# Patient Record
Sex: Female | Born: 1960
Health system: Southern US, Community
[De-identification: ages and names within clinical notes are randomized; demographics above are authoritative.]

## PROBLEM LIST (undated history)

## (undated) DIAGNOSIS — I341 Nonrheumatic mitral (valve) prolapse: Secondary | ICD-10-CM

## (undated) DIAGNOSIS — K219 Gastro-esophageal reflux disease without esophagitis: Secondary | ICD-10-CM

## (undated) DIAGNOSIS — I2510 Atherosclerotic heart disease of native coronary artery without angina pectoris: Secondary | ICD-10-CM

## (undated) DIAGNOSIS — J189 Pneumonia, unspecified organism: Secondary | ICD-10-CM

## (undated) DIAGNOSIS — M1990 Unspecified osteoarthritis, unspecified site: Secondary | ICD-10-CM

## (undated) DIAGNOSIS — R9431 Abnormal electrocardiogram [ECG] [EKG]: Secondary | ICD-10-CM

## (undated) DIAGNOSIS — J449 Chronic obstructive pulmonary disease, unspecified: Secondary | ICD-10-CM

## (undated) DIAGNOSIS — M797 Fibromyalgia: Secondary | ICD-10-CM

## (undated) DIAGNOSIS — I10 Essential (primary) hypertension: Secondary | ICD-10-CM

## (undated) DIAGNOSIS — R7303 Prediabetes: Secondary | ICD-10-CM

## (undated) DIAGNOSIS — N879 Dysplasia of cervix uteri, unspecified: Secondary | ICD-10-CM

## (undated) DIAGNOSIS — G5600 Carpal tunnel syndrome, unspecified upper limb: Secondary | ICD-10-CM

## (undated) DIAGNOSIS — G35 Multiple sclerosis: Secondary | ICD-10-CM

## (undated) DIAGNOSIS — F32A Depression, unspecified: Secondary | ICD-10-CM

## (undated) DIAGNOSIS — R5382 Chronic fatigue, unspecified: Secondary | ICD-10-CM

## (undated) DIAGNOSIS — F329 Major depressive disorder, single episode, unspecified: Secondary | ICD-10-CM

## (undated) DIAGNOSIS — F419 Anxiety disorder, unspecified: Secondary | ICD-10-CM

## (undated) DIAGNOSIS — G2581 Restless legs syndrome: Secondary | ICD-10-CM

## (undated) DIAGNOSIS — Z8489 Family history of other specified conditions: Secondary | ICD-10-CM

## (undated) HISTORY — DX: Carpal tunnel syndrome, unspecified upper limb: G56.00

## (undated) HISTORY — DX: Depression, unspecified: F32.A

## (undated) HISTORY — DX: Major depressive disorder, single episode, unspecified: F32.9

## (undated) HISTORY — PX: DILATION AND CURETTAGE OF UTERUS: SHX78

## (undated) HISTORY — PX: OTHER SURGICAL HISTORY: SHX169

## (undated) HISTORY — DX: Abnormal electrocardiogram (ECG) (EKG): R94.31

## (undated) HISTORY — DX: Chronic fatigue, unspecified: R53.82

## (undated) HISTORY — PX: BICEPS TENDON REPAIR: SHX566

## (undated) HISTORY — DX: Restless legs syndrome: G25.81

## (undated) HISTORY — PX: ORIF TOE FRACTURE: SUR965

## (undated) HISTORY — DX: Multiple sclerosis: G35

## (undated) HISTORY — PX: CORONARY ANGIOPLASTY WITH STENT PLACEMENT: SHX49

## (undated) HISTORY — DX: Nonrheumatic mitral (valve) prolapse: I34.1

## (undated) HISTORY — PX: ROTATOR CUFF REPAIR: SHX139

## (undated) HISTORY — DX: Dysplasia of cervix uteri, unspecified: N87.9

## (undated) HISTORY — DX: Pneumonia, unspecified organism: J18.9

## (undated) HISTORY — PX: CARPAL TUNNEL RELEASE: SHX101

## (undated) HISTORY — PX: ABDOMINAL HYSTERECTOMY: SHX81

## (undated) HISTORY — DX: Gastro-esophageal reflux disease without esophagitis: K21.9

## (undated) HISTORY — DX: Essential (primary) hypertension: I10

## (undated) HISTORY — PX: HAMMER TOE SURGERY: SHX385

## (undated) HISTORY — DX: Anxiety disorder, unspecified: F41.9

## (undated) HISTORY — PX: FOOT SURGERY: SHX648

## (undated) HISTORY — DX: Unspecified osteoarthritis, unspecified site: M19.90

---

## 1999-08-27 ENCOUNTER — Emergency Department (HOSPITAL_COMMUNITY): Admission: EM | Admit: 1999-08-27 | Discharge: 1999-08-27 | Payer: Self-pay | Admitting: Emergency Medicine

## 2005-02-09 ENCOUNTER — Ambulatory Visit (HOSPITAL_COMMUNITY): Admission: RE | Admit: 2005-02-09 | Discharge: 2005-02-10 | Payer: Self-pay | Admitting: Neurological Surgery

## 2005-03-06 ENCOUNTER — Encounter: Admission: RE | Admit: 2005-03-06 | Discharge: 2005-03-06 | Payer: Self-pay | Admitting: Neurological Surgery

## 2005-08-07 ENCOUNTER — Encounter: Admission: RE | Admit: 2005-08-07 | Discharge: 2005-08-07 | Payer: Self-pay | Admitting: Neurological Surgery

## 2007-12-23 ENCOUNTER — Encounter: Admission: RE | Admit: 2007-12-23 | Discharge: 2007-12-23 | Payer: Self-pay | Admitting: Neurological Surgery

## 2008-01-14 ENCOUNTER — Encounter: Admission: RE | Admit: 2008-01-14 | Discharge: 2008-01-14 | Payer: Self-pay | Admitting: Neurology

## 2008-03-19 ENCOUNTER — Encounter: Admission: RE | Admit: 2008-03-19 | Discharge: 2008-03-19 | Payer: Self-pay | Admitting: Rheumatology

## 2009-10-06 ENCOUNTER — Ambulatory Visit (HOSPITAL_COMMUNITY): Admission: RE | Admit: 2009-10-06 | Discharge: 2009-10-07 | Payer: Self-pay | Admitting: Neurological Surgery

## 2009-10-16 HISTORY — PX: CERVICAL SPINE SURGERY: SHX589

## 2009-11-16 ENCOUNTER — Encounter: Admission: RE | Admit: 2009-11-16 | Discharge: 2009-11-16 | Payer: Self-pay | Admitting: Neurological Surgery

## 2010-07-25 LAB — COMPREHENSIVE METABOLIC PANEL
ALT: 20 U/L (ref 0–35)
AST: 22 U/L (ref 0–37)
Albumin: 3.6 g/dL (ref 3.5–5.2)
Alkaline Phosphatase: 115 U/L (ref 39–117)
BUN: 5 mg/dL — ABNORMAL LOW (ref 6–23)
CO2: 28 mEq/L (ref 19–32)
Calcium: 9.2 mg/dL (ref 8.4–10.5)
Chloride: 101 mEq/L (ref 96–112)
Creatinine, Ser: 0.74 mg/dL (ref 0.4–1.2)
GFR calc Af Amer: >60 mL/min (ref >60)
GFR calc non Af Amer: >60 mL/min (ref >60)
Glucose, Bld: 95 mg/dL (ref 70–99)
Potassium: 3.4 mEq/L — ABNORMAL LOW (ref 3.5–5.1)
Sodium: 137 mEq/L (ref 135–145)
Total Bilirubin: 0.6 mg/dL (ref 0.3–1.2)
Total Protein: 6.5 g/dL (ref 6.0–8.3)

## 2010-07-25 LAB — CBC
HCT: 40.6 % (ref 36.0–46.0)
Hemoglobin: 14.3 g/dL (ref 12.0–15.0)
MCHC: 35.2 g/dL (ref 30.0–36.0)
MCV: 96.4 fL (ref 78.0–100.0)
Platelets: 337 10*3/uL (ref 150–400)
RBC: 4.21 MIL/uL (ref 3.87–5.11)
RDW: 13.2 % (ref 11.5–15.5)
WBC: 10.6 10*3/uL — ABNORMAL HIGH (ref 4.0–10.5)

## 2010-07-25 LAB — DIFFERENTIAL
Basophils Absolute: 0 10*3/uL (ref 0.0–0.1)
Basophils Relative: 0 % (ref 0–1)
Eosinophils Absolute: 0 10*3/uL (ref 0.0–0.7)
Eosinophils Relative: 0 % (ref 0–5)
Lymphocytes Relative: 32 % (ref 12–46)
Lymphs Abs: 3.4 10*3/uL (ref 0.7–4.0)
Monocytes Absolute: 0.6 10*3/uL (ref 0.1–1.0)
Monocytes Relative: 6 % (ref 3–12)
Neutro Abs: 6.6 10*3/uL (ref 1.7–7.7)
Neutrophils Relative %: 62 % (ref 43–77)

## 2010-07-25 LAB — PROTIME-INR
INR: 0.94 (ref 0.00–1.49)
Prothrombin Time: 12.5 seconds (ref 11.6–15.2)

## 2010-07-25 LAB — APTT: aPTT: 31 seconds (ref 24–37)

## 2010-07-25 LAB — SURGICAL PCR SCREEN
MRSA, PCR: NEGATIVE
Staphylococcus aureus: NEGATIVE

## 2010-09-23 NOTE — Op Note (Signed)
NAMECAITLAN, CHAUCA                 ACCOUNT NO.:  [id number]   MEDICAL RECORD NO.:  [id number]          PATIENT TYPE:  OIB   LOCATION:  3007                         FACILITY:  [facility]   PHYSICIAN:  Tia Alert, MD     DATE OF BIRTH:  [birth year]   DATE OF PROCEDURE:  02/09/2005  DATE OF DISCHARGE:                                 OPERATIVE REPORT   PREOPERATIVE DIAGNOSIS:  Cervical spondylosis with stenosis, C5-6 and C6-7.   POSTOPERATIVE DIAGNOSIS:  Cervical spondylosis with stenosis, C5-6 and C6-7.   PROCEDURE:  1.  Decompressive anterior cervical diskectomy, C5-6 and C6-7.  2.  Anterior cervical arthrodesis, C5-6 and C6-7, utilizing 7-mm VG2      allografts.  3.  Anterior cervical plating, C5-7, utilizing a 40-mm Atlantis Vision      plate.   SURGEON:  Marikay Alar, M.D.   ASSISTANT:  Aliene Beams, M.D.   ANESTHESIA:  General endotracheal.   COMPLICATIONS:  None apparent.   INDICATIONS FOR PROCEDURE:  Ms. Koeller is a 50 year old white female with a  history of MS, who presented with significant neck pain with arm pain.  She  had an MRI which showed significant cervical spondylosis at C5-6 and C6-7.  She had a previous MRI the year before and the new MRI showed significant  progression of her spondylosis and stenosis at C5-6.  She had worsening neck  pain and arm pain.  I recommended a decompressive anterior cervical  diskectomy with fusion and plating at C5-6 and C6-7.  She understood the  risks, the benefits and expected outcome, and wished to proceed.   DESCRIPTION OF PROCEDURE:  The patient was taken to the operating room and  after induction of adequate generalized endotracheal anesthesia, she was  rolled in the supine position on the operating room table.  Her right  anterior cervical region was prepped with DuraPrep and then draped in the  usual sterile fashion.  Five milliliters of local anesthesia were injected  and the incision was made to the right of midline and  carried down through  the platysma muscle.  The platysma was elevated, opened and undermined with  Metzenbaum scissors.  I then dissected in a plane medial to the  sternocleidomastoid muscle and internal carotid artery and lateral to the  trachea and esophagus to expose C5-6 and C6-7.  Intraoperative fluoroscopy  confirmed my level.  We then used a Leksell rongeur to remove the anterior  osteophytes at C5-7 and C6-7, which had overgrown quite a bit.  We then  placed our Shadowline retractors after the longus colli muscles were taken  down bilaterally.  The C5-6 and C6-7 interspaces were incised and the  initial diskectomy was done with pituitary rongeurs and curved curettes.  I  then used a high-speed drill to drill the endplates to prepare for later  arthrodesis.  I drilled down to the level of the posterior longitudinal  ligament.  We then brought in the operating microscope and the posterior  longitudinal ligament was opened with a nerve hook and then removed in a  circumferential fashion  along with the posterior osteophytes.  We were very  careful to undercut the inferior part of the body C5, the superior part of  the body of C6, inferior body of C6 and superior body of C7 to decompress  the central canal until the dura was quite capacious and coming out at [place]  and no longer pushed away from [place] by the osteophytes.  We performed  bilateral foraminotomies.  The C6 and C7 nerve roots were identified  bilaterally and decompressed out past the pedicle level.  Once decompression  was complete, we dried the surgical bed with Gelfoam.  We removed the  Gelfoam, irrigated with saline solution containing bacitracin, measured the  interspaces and then placed two 7-mm fibular allograft into the interspace  at C5-6 and C6-7.  We then used a 40-mm Atlantis Vision plate and placed two  13-mm variable-angle screws into the body of C5, C6 and C7, and locked these  into position with the locking mechanism  on the plate.  We then irrigated  over it again with saline solution containing bacitracin.  We dried all  bleeding points with bipolar cautery and used also SURGIFLO to help with  hemostasis.  We then irrigated this away and removed it with suction from  the lateral epidural space adjacent to the grafts.  We then inspected once  again to assure adequate hemostasis.  We then closed the platysma with  interrupted 3-0 Vicryl, closed the subcuticular tissue with interrupted 3-0  Vicryl and closed the skin with Dermabond.  The drapes were removed.  The  patient was awakened from general anesthesia and transferred to the recovery  room in stable condition.  At the end of the procedure, all sponge, needle  and instrument counts were correct.      Tia Alert, MD  Electronically Signed     DSJ/MEDQ  D:  02/09/2005  T:  02/10/2005  Job:  [id number]

## 2011-11-27 DIAGNOSIS — R5381 Other malaise: Secondary | ICD-10-CM | POA: Insufficient documentation

## 2011-11-27 DIAGNOSIS — G35 Multiple sclerosis: Secondary | ICD-10-CM | POA: Insufficient documentation

## 2011-11-27 DIAGNOSIS — Z5181 Encounter for therapeutic drug level monitoring: Secondary | ICD-10-CM | POA: Insufficient documentation

## 2011-11-27 DIAGNOSIS — R269 Unspecified abnormalities of gait and mobility: Secondary | ICD-10-CM | POA: Insufficient documentation

## 2012-06-04 DIAGNOSIS — M6530 Trigger finger, unspecified finger: Secondary | ICD-10-CM | POA: Insufficient documentation

## 2012-08-15 ENCOUNTER — Other Ambulatory Visit: Payer: Self-pay

## 2012-08-15 MED ORDER — METHYLPHENIDATE HCL 20 MG PO TABS: 20.0000 mg | ORAL_TABLET | Freq: Every day | ORAL | Status: DC

## 2012-08-15 NOTE — Telephone Encounter (Signed)
Patient requesting refill on Ritalin.  Would like rx mailed to her when ready.

## 2012-09-07 ENCOUNTER — Other Ambulatory Visit: Payer: Self-pay | Admitting: Neurology

## 2012-09-16 ENCOUNTER — Telehealth: Payer: Self-pay | Admitting: Neurology

## 2012-09-19 ENCOUNTER — Other Ambulatory Visit: Payer: Self-pay

## 2012-09-19 MED ORDER — INTERFERON BETA-1A 30 MCG/0.5ML IM KIT: 30.0000 ug | PACK | INTRAMUSCULAR | Status: DC

## 2012-09-23 ENCOUNTER — Other Ambulatory Visit: Payer: Self-pay

## 2012-09-23 ENCOUNTER — Telehealth: Payer: Self-pay | Admitting: Neurology

## 2012-09-23 MED ORDER — METHYLPHENIDATE HCL 20 MG PO TABS: 20.0000 mg | ORAL_TABLET | Freq: Every day | ORAL | Status: DC

## 2012-09-24 ENCOUNTER — Telehealth: Payer: Self-pay | Admitting: *Deleted

## 2012-09-24 ENCOUNTER — Telehealth: Payer: Self-pay

## 2012-09-24 MED ORDER — HYDROXYZINE PAMOATE 25 MG PO CAPS: 25.0000 mg | ORAL_CAPSULE | Freq: Two times a day (BID) | ORAL | Status: DC

## 2012-09-24 NOTE — Telephone Encounter (Signed)
I called the patient. She has had itching for one year over various parts of the body. She has been to a dermatologist, and no source of the itching was found. She may have a neurodermatitis, and I will try vistaril, and if this is not effective, I will try clonazepam.

## 2012-09-24 NOTE — Telephone Encounter (Signed)
I spoke with patient.  She is aware Rx was mailed to her correct address.  Address was incorrect in the Epic System, however I updated it.  She will call me back on Wed if she has not received the Rx in the mail.  I apologized to her for the confusion.

## 2012-10-11 ENCOUNTER — Ambulatory Visit (INDEPENDENT_AMBULATORY_CARE_PROVIDER_SITE_OTHER): Payer: Medicaid Other | Admitting: Neurology

## 2012-10-11 ENCOUNTER — Telehealth: Payer: Self-pay | Admitting: Neurology

## 2012-10-11 ENCOUNTER — Encounter: Payer: Self-pay | Admitting: Neurology

## 2012-10-11 VITALS — BP 130/82 | HR 90 | Ht 62.0 in | Wt 164.0 lb

## 2012-10-11 DIAGNOSIS — Z5181 Encounter for therapeutic drug level monitoring: Secondary | ICD-10-CM

## 2012-10-11 DIAGNOSIS — R269 Unspecified abnormalities of gait and mobility: Secondary | ICD-10-CM

## 2012-10-11 DIAGNOSIS — G35 Multiple sclerosis: Secondary | ICD-10-CM

## 2012-10-11 DIAGNOSIS — R5383 Other fatigue: Secondary | ICD-10-CM

## 2012-10-11 DIAGNOSIS — G35D Multiple sclerosis, unspecified: Secondary | ICD-10-CM

## 2012-10-11 LAB — COMPREHENSIVE METABOLIC PANEL
ALT: 26 IU/L (ref 0–32)
AST: 23 IU/L (ref 0–40)
Albumin/Globulin Ratio: 1.8 (ref 1.1–2.5)
Albumin: 4.5 g/dL (ref 3.5–5.5)
Alkaline Phosphatase: 162 IU/L — ABNORMAL HIGH (ref 39–117)
BUN/Creatinine Ratio: 5 — ABNORMAL LOW (ref 9–23)
BUN: 4 mg/dL — ABNORMAL LOW (ref 6–24)
CO2: 29 mmol/L — ABNORMAL HIGH (ref 19–28)
Calcium: 9.9 mg/dL (ref 8.7–10.2)
Chloride: 103 mmol/L (ref 96–108)
Creatinine, Ser: 0.73 mg/dL (ref 0.57–1.00)
GFR calc Af Amer: 111 mL/min/{1.73_m2} (ref >59)
GFR calc non Af Amer: 96 mL/min/{1.73_m2} (ref >59)
Globulin, Total: 2.5 g/dL (ref 1.5–4.5)
Glucose: 80 mg/dL (ref 65–99)
Potassium: 4.3 mmol/L (ref 3.5–5.2)
Sodium: 140 mmol/L (ref 134–144)
Total Bilirubin: 0.3 mg/dL (ref 0.0–1.2)
Total Protein: 7 g/dL (ref 6.0–8.5)

## 2012-10-11 LAB — CBC WITH DIFFERENTIAL
Basophils Absolute: 0 x10E3/uL (ref 0.0–0.2)
Basos: 0 % (ref 0–3)
Eos: 1 % (ref 0–5)
Eosinophils Absolute: 0.1 x10E3/uL (ref 0.0–0.4)
HCT: 43.8 % (ref 34.0–46.6)
Hemoglobin: 15 g/dL (ref 11.1–15.9)
Lymphocytes Absolute: 3.5 x10E3/uL — ABNORMAL HIGH (ref 0.7–3.1)
Lymphs: 39 % (ref 14–46)
MCH: 30.4 pg (ref 26.6–33.0)
MCHC: 34.2 g/dL (ref 31.5–35.7)
MCV: 89 fL (ref 79–97)
Monocytes Absolute: 0.5 x10E3/uL (ref 0.1–0.9)
Monocytes: 5 % (ref 4–12)
Neutrophils Absolute: 4.9 x10E3/uL (ref 1.4–7.0)
Neutrophils Relative %: 55 % (ref 40–74)
Platelets: 395 x10E3/uL — ABNORMAL HIGH (ref 155–379)
RBC: 4.93 x10E6/uL (ref 3.77–5.28)
RDW: 14.1 % (ref 12.3–15.4)
WBC: 8.9 x10E3/uL (ref 3.4–10.8)

## 2012-10-11 MED ORDER — CLONAZEPAM 0.25 MG PO TBDP: 0.2500 mg | ORAL_TABLET | Freq: Two times a day (BID) | ORAL | Status: DC | PRN

## 2012-10-11 MED ORDER — METHYLPHENIDATE HCL 20 MG PO TABS: 20.0000 mg | ORAL_TABLET | Freq: Every day | ORAL | Status: DC

## 2012-10-11 MED ORDER — MODAFINIL 200 MG PO TABS: 200.0000 mg | ORAL_TABLET | Freq: Every day | ORAL | Status: DC

## 2012-10-11 NOTE — Progress Notes (Signed)
Reason for visit: Multiple sclerosis  Summer Henderson is an 52 y.o. female  History of present illness:  Summer Henderson is a 52 year old right-handed white female with a history of multiple sclerosis. The patient has some mild gait instability, with occasional stumbles. The patient has not fallen since last seen. The patient reports no other new issues with numbness or weakness of the face, arms, or legs. The patient has fatigue issues, but she takes Provigil for this. The patient has chronic itching, and her dermatologist has not been able to find an etiology of this. The use of Vistaril has not helped, only made her drowsy. The patient returns for an evaluation. The last blood work done in July 2013 showed mild liver enzyme elevations.  Past Medical History  Diagnosis Date  . Multiple sclerosis   . Chronic fatigue   . Cervical dysplasia   . GERD (gastroesophageal reflux disease)   . Carpal tunnel syndrome   . Prolonged QT interval     Past Surgical History  Procedure Laterality Date  . Rectovaginal fistula repair    . Abdominal hysterectomy    . Dilation and curettage of uterus    . Foot surgery Bilateral     For bunions  . Carpal tunnel release    . Bladder resuspension    . Hammer toe surgery Left   . Cervical spine surgery  10-16-2009  . Orif toe fracture Right   . Mitral and triscuspid regurgitation      Family History  Problem Relation Age of Onset  . Diabetes Mother   . Melanoma Mother   . Cancer Maternal Grandmother   . Cancer Maternal Grandfather     Social history:  reports that she has been smoking.  She has never used smokeless tobacco. She reports that she does not drink alcohol or use illicit drugs.  Allergies:  Allergies  Allergen Reactions  . Demerol (Meperidine)   . Erythromycin   . Morphine And Related   . Sulfa Antibiotics   . Tape     Adhesive tape    Medications:  Current Outpatient Prescriptions on File Prior to Visit  Medication Sig Dispense  Refill  . DULoxetine (CYMBALTA) 60 MG capsule TAKE ONE CAPSULE BY MOUTH TWICE A DAY  60 capsule  3  . interferon beta-1a (AVONEX) 30 MCG/0.5ML injection Inject 0.5 mLs (30 mcg total) into the muscle every 7 (seven) days.  1 kit  3   No current facility-administered medications on file prior to visit.    ROS:  Out of a complete 14 system review of symptoms, the patient complains only of the following symptoms, and all other reviewed systems are negative.  Weight gain, fatigue Swelling in the legs Itching Snoring Constipation, urinary incontinence Easy bruising, easy bleeding Feeling hot, increased thirst Jaw pain Allergies Memory loss, weakness Depression, anxiety, not enough sleep, decreased energy, and this interest in activities Restless legs  Blood pressure 130/82, pulse 90, height 5\' 2"  (1.575 m), weight 164 lb (74.39 kg).  Physical Exam  General: The patient is alert and cooperative at the time of the examination.  Skin: No significant peripheral edema is noted.   Neurologic Exam  Cranial nerves: Facial symmetry is present. Speech is normal, no aphasia or dysarthria is noted. Extraocular movements are full. Visual fields are full. Pupils are equal, round and reactive to light. Discs are flat bilaterally.  Motor: The patient has good strength in all 4 extremities.  Coordination: The patient has good finger-nose-finger  and heel-to-shin bilaterally.  Gait and station: The patient has a normal gait. Tandem gait is slightly unsteady. Romberg is negative. No drift is seen.  Reflexes: Deep tendon reflexes are symmetric.   Assessment/Plan:  1. Multiple sclerosis  2. Gait disorder  3. Chronic fatigue  4. 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 The patient will followup through this office in about 6 months. MRI of the brain will be ordered. Blood work will be done today.  Marlan Palau  MD 10/11/2012 3:23 PM  [facility] [address] [place], [place] [address]  Phone [phone] Fax [phone]

## 2012-10-11 NOTE — Telephone Encounter (Signed)
I called the patient. The Blood work looked OK. The liver enzymes have essentially normalized with exception of mild elevation of alkaline phosphatase phosphatase.

## 2012-10-25 ENCOUNTER — Telehealth: Payer: Self-pay | Admitting: Neurology

## 2012-10-25 NOTE — Telephone Encounter (Signed)
I called patient. The MRI the brain does show a small enhancing area in the right parietal area. The patient does not wish to switch medications at this time, but we do need to consider other medication such as Tecfidera in the future. We will reconsider getting another MRI in 6-8 months.

## 2012-11-19 ENCOUNTER — Other Ambulatory Visit: Payer: Self-pay

## 2012-11-19 MED ORDER — METHYLPHENIDATE HCL 20 MG PO TABS: 20.0000 mg | ORAL_TABLET | Freq: Every day | ORAL | Status: DC

## 2012-11-19 NOTE — Telephone Encounter (Signed)
Patient called requesting a refill on Ritalin.  She would like it mailed to her when it's ready.

## 2012-11-27 ENCOUNTER — Encounter: Payer: Self-pay | Admitting: Neurology

## 2012-12-19 ENCOUNTER — Emergency Department (HOSPITAL_BASED_OUTPATIENT_CLINIC_OR_DEPARTMENT_OTHER): Payer: Medicare Other

## 2012-12-19 ENCOUNTER — Emergency Department (HOSPITAL_BASED_OUTPATIENT_CLINIC_OR_DEPARTMENT_OTHER)
Admission: EM | Admit: 2012-12-19 | Discharge: 2012-12-20 | Disposition: A | Discharge status: Home / Self Care | Payer: Medicare Other | Attending: Emergency Medicine | Admitting: Emergency Medicine

## 2012-12-19 ENCOUNTER — Encounter (HOSPITAL_BASED_OUTPATIENT_CLINIC_OR_DEPARTMENT_OTHER): Payer: Self-pay | Admitting: Student

## 2012-12-19 DIAGNOSIS — G8929 Other chronic pain: Secondary | ICD-10-CM | POA: Insufficient documentation

## 2012-12-19 DIAGNOSIS — Z87410 Personal history of cervical dysplasia: Secondary | ICD-10-CM | POA: Insufficient documentation

## 2012-12-19 DIAGNOSIS — R5381 Other malaise: Secondary | ICD-10-CM | POA: Insufficient documentation

## 2012-12-19 DIAGNOSIS — Y9389 Activity, other specified: Secondary | ICD-10-CM | POA: Insufficient documentation

## 2012-12-19 DIAGNOSIS — IMO0002 Reserved for concepts with insufficient information to code with codable children: Secondary | ICD-10-CM | POA: Insufficient documentation

## 2012-12-19 DIAGNOSIS — Z8719 Personal history of other diseases of the digestive system: Secondary | ICD-10-CM | POA: Insufficient documentation

## 2012-12-19 DIAGNOSIS — Z8669 Personal history of other diseases of the nervous system and sense organs: Secondary | ICD-10-CM | POA: Insufficient documentation

## 2012-12-19 DIAGNOSIS — W1809XA Striking against other object with subsequent fall, initial encounter: Secondary | ICD-10-CM | POA: Insufficient documentation

## 2012-12-19 DIAGNOSIS — Y929 Unspecified place or not applicable: Secondary | ICD-10-CM | POA: Insufficient documentation

## 2012-12-19 DIAGNOSIS — S62619A Displaced fracture of proximal phalanx of unspecified finger, initial encounter for closed fracture: Secondary | ICD-10-CM

## 2012-12-19 DIAGNOSIS — Z79899 Other long term (current) drug therapy: Secondary | ICD-10-CM | POA: Insufficient documentation

## 2012-12-19 DIAGNOSIS — F17200 Nicotine dependence, unspecified, uncomplicated: Secondary | ICD-10-CM | POA: Insufficient documentation

## 2012-12-19 NOTE — ED Notes (Signed)
Pt reports pain to right hand digit 4 s/p fall

## 2012-12-19 NOTE — ED Notes (Signed)
Patient transported to X-ray 

## 2012-12-20 ENCOUNTER — Other Ambulatory Visit: Payer: Self-pay | Admitting: *Deleted

## 2012-12-20 MED ORDER — METHYLPHENIDATE HCL 20 MG PO TABS: 20.0000 mg | ORAL_TABLET | Freq: Every day | ORAL | Status: DC

## 2012-12-20 NOTE — ED Provider Notes (Signed)
CSN: [id number]     Arrival date & time 12/19/12  2204 History     First MD Initiated Contact with Patient 12/19/12 2312     Chief Complaint  Patient presents with  . Hand Pain    right hand digit 4   HPI Summer Henderson is a 52 y.o. female had a fall a day ago, she caught herself reaching toward a rocking chair and struck the interdigital space between her third and fourth digits on the right hand, patient is had pain and swelling in this area she is concerned that it is broken. Patient's pain is moderate, she's not taken anything for it, is associated with swelling in the area, no numbness or tingling.    Past Medical History  Diagnosis Date  . Multiple sclerosis   . Chronic fatigue   . Cervical dysplasia   . GERD (gastroesophageal reflux disease)   . Carpal tunnel syndrome   . Prolonged QT interval    Past Surgical History  Procedure Laterality Date  . Rectovaginal fistula repair    . Abdominal hysterectomy    . Dilation and curettage of uterus    . Foot surgery Bilateral     For bunions  . Carpal tunnel release    . Bladder resuspension    . Hammer toe surgery Left   . Cervical spine surgery  10-16-2009  . Orif toe fracture Right   . Mitral and triscuspid regurgitation     Family History  Problem Relation Age of Onset  . Diabetes Mother   . Melanoma Mother   . Cancer Maternal Grandmother   . Cancer Maternal Grandfather    History  Substance Use Topics  . Smoking status: Current Every Day Smoker -- 1.00 packs/day  . Smokeless tobacco: Never Used  . Alcohol Use: No   OB History   Grav Para Term Preterm Abortions TAB SAB Ect Mult Living                 Review of Systems At least 10pt or greater review of systems completed and are negative except where specified in the HPI.  Allergies  Demerol; Erythromycin; Morphine and related; Sulfa antibiotics; and Tape  Home Medications   Current Outpatient Rx  Name  Route  Sig  Dispense  Refill  . clonazePAM  (KLONOPIN) 0.25 MG disintegrating tablet   Oral   Take 1 tablet (0.25 mg total) by mouth 2 (two) times daily as needed.   60 tablet   1   . DULoxetine (CYMBALTA) 60 MG capsule      TAKE ONE CAPSULE BY MOUTH TWICE A DAY   60 capsule   3     PLEASE SCHEDULE APPT   . ENABLEX 7.5 MG 24 hr tablet   Oral   Take 7.5 mg by mouth daily.         . interferon beta-1a (AVONEX) 30 MCG/0.5ML injection   Intramuscular   Inject 0.5 mLs (30 mcg total) into the muscle every 7 (seven) days.   1 kit   3     Please Schedule Appt   . KLOR-CON M20 20 MEQ tablet   Oral   Take 20 tablets by mouth daily.         Marland Kitchen losartan (COZAAR) 50 MG tablet   Oral   Take 50 mg by mouth daily.         . methylphenidate (RITALIN) 20 MG tablet   Oral   Take 1 tablet (20 mg  total) by mouth daily. At noon   30 tablet   0     Mail   . modafinil (PROVIGIL) 200 MG tablet   Oral   Take 1 tablet (200 mg total) by mouth daily.   30 tablet   5   . NASONEX 50 MCG/ACT nasal spray   Nasal   Place 50 sprays into the nose daily.         Marland Kitchen NEXIUM 40 MG capsule   Oral   Take 40 mg by mouth daily.          BP 141/90  Pulse 83  Temp(Src) 98.2 F (36.8 C) (Oral)  Resp 20  SpO2 100% Physical Exam  Nursing notes reviewed.  Electronic medical record reviewed. VITAL SIGNS:   Filed Vitals:   12/19/12 2219  BP: 141/90  Pulse: 83  Temp: 98.2 F (36.8 C)  TempSrc: Oral  Resp: 20  SpO2: 100%   CONSTITUTIONAL: Awake, oriented, appears non-toxic HENT: Atraumatic, normocephalic, oral mucosa pink and moist, airway patent. Nares patent without drainage. External ears normal. EYES: Conjunctiva clear, EOMI, PERRLA NECK: Trachea midline, non-tender, supple CARDIOVASCULAR: Normal heart rate, Normal rhythm, No murmurs, rubs, gallops PULMONARY/CHEST: Clear to auscultation, no rhonchi, wheezes, or rales. Symmetrical breath sounds. Non-tender. NEUROLOGIC: Non-focal, moving all four extremities, no gross  sensory or motor deficits. EXTREMITIES: No clubbing, cyanosis, or edema. Some soft tissue swelling on the dorsal and volar aspects between the MTPs of the third and fourth digits, point of maximum tenderness at the fourth MTP joint. Distally neurovascularly intact with good capillary refill and sensation to SKIN: Warm, Dry, No erythema, No rash  ED Course   Procedures (including critical care time)  Labs Reviewed - No data to display Dg Hand Complete Right  12/20/2012   *RADIOLOGY REPORT*  Clinical Data: Fall, right hand pain  RIGHT HAND - COMPLETE 3+ VIEW  Comparison: None.  Findings: Curvilinear trabecular disruption is noted at the base of the proximal phalanx of the right fourth digit.  The bones mildly osteopenic, which may reduce the sensitivity for detection of acute fracture.  No soft tissue abnormality.  No radiopaque foreign body.  IMPRESSION: Possible nondisplaced fracture, base of the right fourth proximal phalanx.   Original Report Authenticated By: Christiana Pellant, M.D.   1. Proximal phalanx fracture of finger, closed, initial encounter     MDM  Patient with nondisplaced proximal phalanx fracture of fourth digit of right hand.  Discussed buddy taping with the patient, she can followup with Dr. Orlan Leavens.  Do not think a splint is necessary with this fracture, buddy taping should suffice. Return precautions given, discharged home stable and good condition  Jones Skene, MD 12/20/12 [phone]

## 2012-12-24 ENCOUNTER — Telehealth: Payer: Self-pay | Admitting: Neurology

## 2012-12-24 NOTE — Telephone Encounter (Signed)
I was out of the office when the patient called last week to request this refill.  I checked with Tomeko, and she confirmed the Rx was mailed.  Since it was Friday afternoon, it likely did not go out until Monday.  I called the patient back, got no answer.  Left message.

## 2013-01-20 ENCOUNTER — Other Ambulatory Visit: Payer: Self-pay

## 2013-01-20 MED ORDER — METHYLPHENIDATE HCL 20 MG PO TABS: 20.0000 mg | ORAL_TABLET | Freq: Every day | ORAL | Status: DC

## 2013-01-20 NOTE — Telephone Encounter (Signed)
Rx signed and mailed.

## 2013-01-20 NOTE — Telephone Encounter (Signed)
Patient called requesting a refill on Ritalin.  She would like the Rx mailed to her when it's ready.  Dr Anne Hahn is out of the office, forwarding request to Dr Marjory Lies [facility]

## 2013-01-30 ENCOUNTER — Other Ambulatory Visit: Payer: Self-pay

## 2013-01-30 ENCOUNTER — Telehealth: Payer: Self-pay | Admitting: Neurology

## 2013-01-30 MED ORDER — INTERFERON BETA-1A 30 MCG/0.5ML IM KIT: 30.0000 ug | PACK | INTRAMUSCULAR | Status: DC

## 2013-01-31 MED ORDER — INTERFERON BETA-1A 30 MCG/0.5ML IM KIT: 30.0000 ug | PACK | INTRAMUSCULAR | Status: DC

## 2013-01-31 NOTE — Telephone Encounter (Signed)
Rx resent to [facility]

## 2013-02-04 ENCOUNTER — Other Ambulatory Visit: Payer: Self-pay

## 2013-02-04 MED ORDER — INTERFERON BETA-1A 30 MCG/0.5ML IM KIT: 30.0000 ug | PACK | INTRAMUSCULAR | Status: DC

## 2013-02-04 NOTE — Telephone Encounter (Signed)
Summer Henderson from [facility] called requesting we send them the Rx for Avonex.

## 2013-02-20 ENCOUNTER — Ambulatory Visit (INDEPENDENT_AMBULATORY_CARE_PROVIDER_SITE_OTHER): Payer: Medicare Other | Admitting: Cardiology

## 2013-02-20 ENCOUNTER — Encounter: Payer: Self-pay | Admitting: Cardiology

## 2013-02-20 VITALS — BP 130/86 | HR 83 | Ht 62.5 in | Wt 166.0 lb

## 2013-02-20 DIAGNOSIS — I10 Essential (primary) hypertension: Secondary | ICD-10-CM | POA: Insufficient documentation

## 2013-02-20 DIAGNOSIS — Z720 Tobacco use: Secondary | ICD-10-CM | POA: Insufficient documentation

## 2013-02-20 DIAGNOSIS — I341 Nonrheumatic mitral (valve) prolapse: Secondary | ICD-10-CM

## 2013-02-20 DIAGNOSIS — R9431 Abnormal electrocardiogram [ECG] [EKG]: Secondary | ICD-10-CM | POA: Insufficient documentation

## 2013-02-20 DIAGNOSIS — I059 Rheumatic mitral valve disease, unspecified: Secondary | ICD-10-CM

## 2013-02-20 LAB — BASIC METABOLIC PANEL
BUN: 5 mg/dL — ABNORMAL LOW (ref 6–23)
CO2: 30 mEq/L (ref 19–32)
Calcium: 9.3 mg/dL (ref 8.4–10.5)
Chloride: 104 mEq/L (ref 96–112)
Creatinine, Ser: 0.7 mg/dL (ref 0.4–1.2)
Glucose, Bld: 81 mg/dL (ref 70–99)

## 2013-02-20 MED ORDER — LOSARTAN POTASSIUM 50 MG PO TABS: 50.0000 mg | ORAL_TABLET | Freq: Every day | ORAL | Status: DC

## 2013-02-20 MED ORDER — POTASSIUM CHLORIDE CRYS ER 20 MEQ PO TBCR: 400.0000 meq | EXTENDED_RELEASE_TABLET | Freq: Every day | ORAL | Status: DC

## 2013-02-20 NOTE — Assessment & Plan Note (Signed)
Patient counseled on discontinuing. 

## 2013-02-20 NOTE — Assessment & Plan Note (Signed)
No history of syncope, palpitations and no family history of sudden death. Patient instructed on left side identifying medications a prolonged QT interval to avoid.

## 2013-02-20 NOTE — Patient Instructions (Addendum)
Your physician wants you to follow-up in: ONE YEAR WITH DR CRENSHAW You will receive a reminder letter in the mail two months in advance. If you don't receive a letter, please call our office to schedule the follow-up appointment.   Your physician recommends that you HAVE LAB WORK TODAY 

## 2013-02-20 NOTE — Assessment & Plan Note (Signed)
Mitral regurgitation mild on most recent echo. 

## 2013-02-20 NOTE — Progress Notes (Signed)
HPI: 52 year old female for evaluation of hypertension, prolonged QT and mitral valve prolapse. Previously followed in [facility]. Holter monitor in September of 2012 showed sinus rhythm with rare PVC. Echocardiogram in April of 2013 showed normal LV function. There was mild mitral regurgitation. Patient has dyspnea on exertion which she attributes to smoking. No orthopnea or PND. Occasional mild pedal edema. No chest pain. No history of syncope or palpitations.  Current Outpatient Prescriptions  Medication Sig Dispense Refill  . aspirin 81 MG tablet Take 81 mg by mouth daily.      . clonazePAM (KLONOPIN) 0.25 MG disintegrating tablet Take 1 tablet (0.25 mg total) by mouth 2 (two) times daily as needed.  60 tablet  1  . DULoxetine (CYMBALTA) 60 MG capsule TAKE ONE CAPSULE BY MOUTH TWICE A DAY  60 capsule  3  . ENABLEX 7.5 MG 24 hr tablet Take 7.5 mg by mouth daily.      . interferon beta-1a (AVONEX) 30 MCG/0.5ML injection Inject 0.5 mLs (30 mcg total) into the muscle every 7 (seven) days.  1 kit  3  . KLOR-CON M20 20 MEQ tablet Take 20 tablets by mouth daily.      Marland Kitchen losartan (COZAAR) 50 MG tablet Take 50 mg by mouth daily.      . methylphenidate (RITALIN) 20 MG tablet Take 1 tablet (20 mg total) by mouth daily. At noon  30 tablet  0  . modafinil (PROVIGIL) 200 MG tablet Take 1 tablet (200 mg total) by mouth daily.  30 tablet  5  . NASONEX 50 MCG/ACT nasal spray Place 50 sprays into the nose daily.      Marland Kitchen NEXIUM 40 MG capsule Take 40 mg by mouth daily.       No current facility-administered medications for this visit.    Allergies  Allergen Reactions  . Demerol [Meperidine]   . Erythromycin   . Morphine And Related   . Sulfa Antibiotics   . Diflucan [Fluconazole]   . Toviaz [Fesoterodine Fumarate Er]   . Tape     Adhesive tape    Past Medical History  Diagnosis Date  . Multiple sclerosis   . Chronic fatigue   . Cervical dysplasia   . GERD (gastroesophageal reflux disease)     . Carpal tunnel syndrome   . Prolonged QT interval   . Hypertension   . Mitral valve prolapse     Past Surgical History  Procedure Laterality Date  . Rectovaginal fistula repair    . Abdominal hysterectomy    . Dilation and curettage of uterus    . Foot surgery Bilateral     For bunions  . Carpal tunnel release    . Bladder resuspension    . Hammer toe surgery Left   . Cervical spine surgery  10-16-2009  . Orif toe fracture Right     History   Social History  . Marital Status: Divorced    Spouse Name: N/A    Number of Children: 2  . Years of Education: 13   Occupational History  . Unemployed     Disability   Social History Main Topics  . Smoking status: Current Every Day Smoker -- 1.00 packs/day  . Smokeless tobacco: Never Used  . Alcohol Use: No  . Drug Use: No  . Sexual Activity: Not on file   Other Topics Concern  . Not on file   Social History Narrative  . No narrative on file    Family  History  Problem Relation Age of Onset  . Diabetes Mother   . Melanoma Mother   . Cancer Maternal Grandmother   . Cancer Maternal Grandfather     ROS: no fevers or chills, productive cough, hemoptysis, dysphasia, odynophagia, melena, hematochezia, dysuria, hematuria, rash, seizure activity, orthopnea, PND, pedal edema, claudication. Remaining systems are negative.  Physical Exam:   Blood pressure 130/86, pulse 83, height 5' 2.5" (1.588 m), weight 166 lb (75.297 kg).  General:  Well developed/well nourished in NAD Skin warm/dry Patient not depressed No peripheral clubbing Back-normal HEENT-normal/normal eyelids Neck supple/normal carotid upstroke bilaterally; no bruits; no JVD; no thyromegaly chest - CTA/ normal expansion CV - RRR/normal S1 and S2; no murmurs, rubs or gallops;  PMI nondisplaced Abdomen -NT/ND, no HSM, no mass, + bowel sounds, no bruit 2+ femoral pulses, no bruits Ext-no edema, chords, 2+ DP Neuro-grossly nonfocal  ECG sinus rhythm at a rate  of 83. Mildly prolonged QT interval.

## 2013-02-20 NOTE — Assessment & Plan Note (Signed)
Plan continue Cozaar. Check potassium and renal function.

## 2013-02-21 ENCOUNTER — Other Ambulatory Visit: Payer: Self-pay

## 2013-02-21 MED ORDER — METHYLPHENIDATE HCL 20 MG PO TABS: 20.0000 mg | ORAL_TABLET | Freq: Every day | ORAL | Status: DC

## 2013-02-21 NOTE — Telephone Encounter (Signed)
Patient called requesting a refill on Ritalin.  She would like the Rx mailed to her when it's ready.  

## 2013-02-24 NOTE — Telephone Encounter (Signed)
Rx signed and mailed.

## 2013-03-18 ENCOUNTER — Telehealth: Payer: Self-pay | Admitting: *Deleted

## 2013-03-19 ENCOUNTER — Other Ambulatory Visit: Payer: Self-pay

## 2013-03-19 ENCOUNTER — Telehealth: Payer: Self-pay | Admitting: Neurology

## 2013-03-19 MED ORDER — METHYLPHENIDATE HCL 20 MG PO TABS: 20.0000 mg | ORAL_TABLET | Freq: Every day | ORAL | Status: DC

## 2013-03-19 NOTE — Telephone Encounter (Signed)
Requesting refill of Ritalin

## 2013-03-19 NOTE — Telephone Encounter (Signed)
Call patient to inform her that her prescription was ready and the patient stated that she would like for it to be mail to her. I stated that I would put it in the mail today.

## 2013-03-27 NOTE — Telephone Encounter (Signed)
error 

## 2013-04-01 DIAGNOSIS — M7700 Medial epicondylitis, unspecified elbow: Secondary | ICD-10-CM | POA: Insufficient documentation

## 2013-04-19 ENCOUNTER — Other Ambulatory Visit: Payer: Self-pay | Admitting: Neurology

## 2013-04-21 NOTE — Telephone Encounter (Signed)
Request came in over the weekend

## 2013-04-21 NOTE — Telephone Encounter (Signed)
Rx provigil faxed

## 2013-04-23 ENCOUNTER — Encounter: Payer: Self-pay | Admitting: Neurology

## 2013-04-23 ENCOUNTER — Ambulatory Visit (INDEPENDENT_AMBULATORY_CARE_PROVIDER_SITE_OTHER): Payer: Medicare Other | Admitting: Neurology

## 2013-04-23 VITALS — BP 134/82 | HR 95 | Wt 166.0 lb

## 2013-04-23 DIAGNOSIS — G35 Multiple sclerosis: Secondary | ICD-10-CM

## 2013-04-23 MED ORDER — METHYLPHENIDATE HCL 20 MG PO TABS: 20.0000 mg | ORAL_TABLET | Freq: Every day | ORAL | Status: DC

## 2013-04-23 NOTE — Progress Notes (Signed)
Reason for visit: Multiple sclerosis  Summer Henderson is an 52 y.o. female  History of present illness:  Summer Henderson is a 53 year old right-handed white female with a history of multiple sclerosis. The patient has felt as if the balance has gradually worsened over time. The patient has noted that her right foot will turn in, and she will fall when she tries to walk on the side of her foot. The patient does have an ankle brace, but she has not been able to find it. The patient has ongoing problems with fatigue, and she is on Provigil and Ritalin. The patient also reports some cognitive issues. Recently, she has had some problems with right upper extremity discomfort including the shoulder and forearm. The patient had an injection in the elbow without much benefit. The patient comes to this office for an evaluation. The patient remains on Avonex, and she is tolerating the medication well. The last MRI study done in June of 2014 did show a right parietal enhancing lesion. The patient has not wanted to switch off of her current disease modifying agent.  Past Medical History  Diagnosis Date  . Multiple sclerosis   . Chronic fatigue   . Cervical dysplasia   . GERD (gastroesophageal reflux disease)   . Carpal tunnel syndrome   . Prolonged QT interval   . Hypertension   . Mitral valve prolapse     Past Surgical History  Procedure Laterality Date  . Rectovaginal fistula repair    . Abdominal hysterectomy    . Dilation and curettage of uterus    . Foot surgery Bilateral     For bunions  . Carpal tunnel release    . Bladder resuspension    . Hammer toe surgery Left   . Cervical spine surgery  10-16-2009  . Orif toe fracture Right     Family History  Problem Relation Age of Onset  . Diabetes Mother   . Melanoma Mother   . Cancer Maternal Grandmother   . Cancer Maternal Grandfather     Social history:  reports that she has been smoking.  She has never used smokeless tobacco. She reports  that she does not drink alcohol or use illicit drugs.    Allergies  Allergen Reactions  . Demerol [Meperidine]   . Erythromycin   . Morphine And Related   . Sulfa Antibiotics   . Diflucan [Fluconazole]   . Toviaz [Fesoterodine Fumarate Er]   . Tape     Adhesive tape    Medications:  Current Outpatient Prescriptions on File Prior to Visit  Medication Sig Dispense Refill  . aspirin 81 MG tablet Take 81 mg by mouth daily.      . clonazePAM (KLONOPIN) 0.25 MG disintegrating tablet Take 1 tablet (0.25 mg total) by mouth 2 (two) times daily as needed.  60 tablet  1  . DULoxetine (CYMBALTA) 60 MG capsule TAKE ONE CAPSULE BY MOUTH TWICE A DAY  60 capsule  3  . ENABLEX 7.5 MG 24 hr tablet Take 7.5 mg by mouth daily.      . interferon beta-1a (AVONEX) 30 MCG/0.5ML injection Inject 0.5 mLs (30 mcg total) into the muscle every 7 (seven) days.  1 kit  3  . losartan (COZAAR) 50 MG tablet Take 1 tablet (50 mg total) by mouth daily.  90 tablet  4  . modafinil (PROVIGIL) 200 MG tablet TAKE 1 TABLET EVERY DAY  30 tablet  5  . NASONEX 50 MCG/ACT nasal  spray Place 50 sprays into the nose daily.      Marland Kitchen NEXIUM 40 MG capsule Take 40 mg by mouth daily.      . potassium chloride SA (KLOR-CON M20) 20 MEQ tablet Take 20 tablets (400 mEq total) by mouth daily.  90 tablet  4   No current facility-administered medications on file prior to visit.    ROS:  Out of a complete 14 system review of symptoms, the patient complains only of the following symptoms, and all other reviewed systems are negative.  Fatigue Heat intolerance, excessive thirst Constipation Restless legs, frequent waking, snoring Incontinence of bladder, frequency of urination, urgency Difficulty walking Memory loss Depression, anxiety  Blood pressure 134/82, pulse 95, weight 166 lb (75.297 kg).  Physical Exam  General: The patient is alert and cooperative at the time of the examination. The patient is moderately obese.  Skin: No  significant peripheral edema is noted.   Neurologic Exam  Mental status: The Mini-Mental status examination done today shows a total score 25/30.  Cranial nerves: Facial symmetry is present. Speech is normal, no aphasia or dysarthria is noted. Extraocular movements are full. Visual fields are full. Pupils are equal, round, and reactive to light discs are flat bilaterally.  Motor: The patient has good strength in all 4 extremities.  Sensory examination: Soft touch sensation on the face, arms, and legs is symmetric.  Coordination: The patient has good finger-nose-finger and heel-to-shin bilaterally.  Gait and station: The patient has a slightly unsteady gait. Tandem gait is unsteady. Romberg is negative. No drift is seen.  Reflexes: Deep tendon reflexes are symmetric.   Assessment/Plan:  1. Multiple sclerosis  2. Gait disorder  The patient will be given a prescription for an ankle brace for the right ankle. The patient will need another MRI of the brain in 3 months or so to followup on the last study that showed an enhancing lesion. If progression is noted, I will once again recommend switching to another disease modifying agent. The patient will followup through this office in 6 months. A prescription was given for the Ritalin.  Marlan Palau MD 04/23/2013 7:57 PM  [facility] [address] [place], [place] [address]  Phone [phone] Fax [phone]

## 2013-04-23 NOTE — Patient Instructions (Signed)
Multiple Sclerosis Multiple sclerosis (MS) is a disease of the central nervous system. Its cause is unknown. It is more common in the northern states than in the southern states. There is a higher incidence of MS in women. There is a wide variation in the symptoms (problems) of MS. This is because of the many different ways it affects the central nervous system. It often comes on in episodes or attacks. These attacks may last weeks to months. There may be long periods of nearly no problems between attacks. The main symptoms include visual problems (associated with eye pain), numbness, weakness, and paralysis in extremities (arms/hands and legs/feet). There may also be tremors and problems with balance and walking. The age when MS starts is variable. Advances in medicine continue to improve the treatment of this illness. There is no known cure for MS but there are medications that help. MS is not an inherited illness, although your risk of getting this disease is higher if you have a relative with MS. The best radiologic (x-ray) study for MS is an MRI (magnetic resonance imaging). There are medications available to decrease the number and frequency of attacks. SYMPTOMS  The symptoms of MS are caused by loss of insulation (myelin) of the nerves of the brain. When this happens, brain signals do not get transmitted properly or may not get transmitted at all. Some of the problems caused by this include:   Numbness.  Weakness.  Paralysis in extremities.  Visual problems, eye pain.  Balance problems.  Tremors. DIAGNOSIS  Your caregiver can do studies on you to make this diagnosis. This may include specialized X-rays and spinal fluid studies. HOME CARE INSTRUCTIONS   Take medications as directed by your caregiver. Baclofen is a drug commonly used to reduce muscle spasticity. Steroids are often used for short term relief.  Exercise as directed.  Use physical and occupational therapy as directed by  your caregiver. Careful attention to this medical care can help avoid depression.  See your caregiver if you begin to have problems with depression. This is a common problem in MS. Patients often continue to work many years after the diagnosis of MS. Document Released: 04/21/2000 Document Revised: 07/17/2011 Document Reviewed: 11/28/2006 [employer] Patient Information 2014 [employer].  

## 2013-05-12 DIAGNOSIS — IMO0002 Reserved for concepts with insufficient information to code with codable children: Secondary | ICD-10-CM | POA: Insufficient documentation

## 2013-05-21 ENCOUNTER — Other Ambulatory Visit: Payer: Self-pay

## 2013-05-21 DIAGNOSIS — M7710 Lateral epicondylitis, unspecified elbow: Secondary | ICD-10-CM | POA: Insufficient documentation

## 2013-05-21 DIAGNOSIS — M7520 Bicipital tendinitis, unspecified shoulder: Secondary | ICD-10-CM | POA: Insufficient documentation

## 2013-05-21 MED ORDER — METHYLPHENIDATE HCL 20 MG PO TABS: 20.0000 mg | ORAL_TABLET | Freq: Every day | ORAL | Status: DC

## 2013-05-21 NOTE — Telephone Encounter (Signed)
Patient called requesting a refill on Ritalin.  She would like the Rx mailed when it is ready.

## 2013-05-21 NOTE — Telephone Encounter (Signed)
I called and confirmed address is the same. I will mail Rx out today.

## 2013-05-28 ENCOUNTER — Other Ambulatory Visit: Payer: Self-pay

## 2013-05-28 MED ORDER — INTERFERON BETA-1A 30 MCG/0.5ML IM KIT: 30.0000 ug | PACK | INTRAMUSCULAR | Status: DC

## 2013-05-30 ENCOUNTER — Other Ambulatory Visit: Payer: Self-pay | Admitting: Neurology

## 2013-05-30 ENCOUNTER — Telehealth: Payer: Self-pay | Admitting: Cardiology

## 2013-05-30 NOTE — Telephone Encounter (Signed)
Pt is aware she has appt jan 28 at 9:30 with Dr. Jens Som at [place]

## 2013-05-30 NOTE — Telephone Encounter (Signed)
New message     Ankles and legs are really swollen--

## 2013-05-30 NOTE — Telephone Encounter (Signed)
Pt reports bilat lower extremities are "huge"  Edema is equal bilaterally. Noticed yesterday afternoon and applied stockings, no improvement today.  Legs are not painful.  She is not SOB, had arm surgery on Tuesday at [facility].  SBP in pre op was 120s.  Unable to check BP at home.  Weight in pre op was 168-no significant change from last OV with Dr. Jens Som.  Wanted to give pt appointment with physician extender early next week.  Pt is requesting appt in [place] because it will be more convenient.  Told her I will check with scheduler and make one with Dr. Jens Som wed if possible, otherwise she would need to come here.  Pt verbalizes understanding.

## 2013-06-04 ENCOUNTER — Encounter: Payer: Self-pay | Admitting: Cardiology

## 2013-06-04 ENCOUNTER — Other Ambulatory Visit: Payer: Self-pay | Admitting: *Deleted

## 2013-06-04 ENCOUNTER — Ambulatory Visit (INDEPENDENT_AMBULATORY_CARE_PROVIDER_SITE_OTHER): Payer: Medicaid Other | Admitting: Cardiology

## 2013-06-04 VITALS — BP 142/80 | HR 92 | Ht 63.0 in | Wt 172.0 lb

## 2013-06-04 DIAGNOSIS — I059 Rheumatic mitral valve disease, unspecified: Secondary | ICD-10-CM

## 2013-06-04 DIAGNOSIS — F17200 Nicotine dependence, unspecified, uncomplicated: Secondary | ICD-10-CM

## 2013-06-04 DIAGNOSIS — Z720 Tobacco use: Secondary | ICD-10-CM

## 2013-06-04 DIAGNOSIS — R9431 Abnormal electrocardiogram [ECG] [EKG]: Secondary | ICD-10-CM

## 2013-06-04 DIAGNOSIS — I341 Nonrheumatic mitral (valve) prolapse: Secondary | ICD-10-CM

## 2013-06-04 DIAGNOSIS — R609 Edema, unspecified: Secondary | ICD-10-CM | POA: Insufficient documentation

## 2013-06-04 MED ORDER — FUROSEMIDE 20 MG PO TABS: ORAL_TABLET | ORAL | Status: DC

## 2013-06-04 NOTE — Progress Notes (Signed)
HPI: FU hypertension, prolonged QT and mitral valve prolapse. Previously followed in [facility]. Holter monitor in September of 2012 showed sinus rhythm with rare PVC. Echocardiogram in April of 2013 showed normal LV function. There was mild mitral regurgitation. Last seen in October of 2014. Patient contacted the office last week with complaints of lower extremity edema. She was added to my schedule today. She recently had surgery on her elbow. She has had problems with lower extremity edema previously but this worsened significantly postoperatively. She denies dyspnea or chest pain.   Current Outpatient Prescriptions  Medication Sig Dispense Refill  . aspirin 81 MG tablet Take 81 mg by mouth daily.      . AVONEX PEN 30 MCG/0.5ML injection Inject 0.31ml ( ) intramuscularly once a week as directed by your physician.  1 each  2  . clonazePAM (KLONOPIN) 0.25 MG disintegrating tablet Take 1 tablet (0.25 mg total) by mouth 2 (two) times daily as needed.  60 tablet  1  . DULoxetine (CYMBALTA) 60 MG capsule TAKE ONE CAPSULE BY MOUTH TWICE A DAY  60 capsule  3  . ENABLEX 7.5 MG 24 hr tablet Take 7.5 mg by mouth daily.      Marland Kitchen losartan (COZAAR) 50 MG tablet Take 1 tablet (50 mg total) by mouth daily.  90 tablet  4  . methylphenidate (RITALIN) 20 MG tablet Take 1 tablet (20 mg total) by mouth daily. At noon  30 tablet  0  . modafinil (PROVIGIL) 200 MG tablet TAKE 1 TABLET EVERY DAY  30 tablet  5  . NASONEX 50 MCG/ACT nasal spray Place 50 sprays into the nose daily.      Marland Kitchen NEXIUM 40 MG capsule Take 40 mg by mouth daily.      . furosemide (LASIX) 20 MG tablet ONE TABLET ONCE DAILY X 3 DAYS THEN AS NEEDED FOR SWELLING OR SHORTNESS OF BREATH  30 tablet  12   No current facility-administered medications for this visit.     Past Medical History  Diagnosis Date  . Multiple sclerosis   . Chronic fatigue   . Cervical dysplasia   . GERD (gastroesophageal reflux disease)   . Carpal tunnel syndrome    . Prolonged QT interval   . Hypertension   . Mitral valve prolapse     Past Surgical History  Procedure Laterality Date  . Rectovaginal fistula repair    . Abdominal hysterectomy    . Dilation and curettage of uterus    . Foot surgery Bilateral     For bunions  . Carpal tunnel release    . Bladder resuspension    . Hammer toe surgery Left   . Cervical spine surgery  10-16-2009  . Orif toe fracture Right     History   Social History  . Marital Status: Divorced    Spouse Name: N/A    Number of Children: 2  . Years of Education: 13   Occupational History  . Unemployed     Disability   Social History Main Topics  . Smoking status: Current Every Day Smoker -- 1.00 packs/day  . Smokeless tobacco: Never Used  . Alcohol Use: No  . Drug Use: No  . Sexual Activity: Not on file   Other Topics Concern  . Not on file   Social History Narrative  . No narrative on file    ROS: no fevers or chills, productive cough, hemoptysis, dysphasia, odynophagia, melena, hematochezia, dysuria, hematuria, rash, seizure activity,  orthopnea, PND,  claudication. Remaining systems are negative.  Physical Exam: Well-developed well-nourished in no acute distress.  Skin is warm and dry.  HEENT is normal.  Neck is supple.  Chest is clear to auscultation with normal expansion.  Cardiovascular exam is regular rate and rhythm.  Abdominal exam nontender or distended. No masses palpated. Extremities show trace to 1+ bilateral edema. Right upper extremity in cast. neuro grossly intact  Sinus rhythm at a rate of 93. Mildly prolonged QT interval.

## 2013-06-04 NOTE — Patient Instructions (Signed)
Your physician wants you to follow-up in: 3 MONTHS WITH DR Jens Som IN [place] You will receive a reminder letter in the mail two months in advance. If you don't receive a letter, please call our office to schedule the follow-up appointment.   TAKE FUROSEMIDE 20 MG ONCE DAILY X 3 DAYS THEN DECREASE TO AS NEEDED FOR SWELLING OR SHORTNESS OF BREATH  Your physician recommends that you return for lab work in: ONE WEEK IN [place]

## 2013-06-04 NOTE — Assessment & Plan Note (Signed)
Mild increased lower extremity edema. May be related to hydration during recent surgery. I have given Lasix 20 mg daily for 3 days. She then will take as needed. Check potassium and renal function in one week. Continue compression hose.

## 2013-06-04 NOTE — Assessment & Plan Note (Signed)
Patient counseled on discontinuing. 

## 2013-06-04 NOTE — Assessment & Plan Note (Signed)
Mitral regurgitation mild on most recent echo. 

## 2013-06-04 NOTE — Assessment & Plan Note (Signed)
Continue present blood pressure medications. 

## 2013-06-04 NOTE — Assessment & Plan Note (Signed)
No history of syncope. ?

## 2013-06-06 ENCOUNTER — Encounter (HOSPITAL_BASED_OUTPATIENT_CLINIC_OR_DEPARTMENT_OTHER): Payer: Self-pay | Admitting: Emergency Medicine

## 2013-06-06 ENCOUNTER — Emergency Department (HOSPITAL_BASED_OUTPATIENT_CLINIC_OR_DEPARTMENT_OTHER)
Admission: EM | Admit: 2013-06-06 | Discharge: 2013-06-06 | Disposition: A | Discharge status: Home / Self Care | Payer: Medicare Other | Attending: Emergency Medicine | Admitting: Emergency Medicine

## 2013-06-06 DIAGNOSIS — M5430 Sciatica, unspecified side: Secondary | ICD-10-CM | POA: Insufficient documentation

## 2013-06-06 DIAGNOSIS — Z79899 Other long term (current) drug therapy: Secondary | ICD-10-CM | POA: Insufficient documentation

## 2013-06-06 DIAGNOSIS — I10 Essential (primary) hypertension: Secondary | ICD-10-CM | POA: Insufficient documentation

## 2013-06-06 DIAGNOSIS — Z8669 Personal history of other diseases of the nervous system and sense organs: Secondary | ICD-10-CM | POA: Insufficient documentation

## 2013-06-06 DIAGNOSIS — K219 Gastro-esophageal reflux disease without esophagitis: Secondary | ICD-10-CM | POA: Insufficient documentation

## 2013-06-06 DIAGNOSIS — F17200 Nicotine dependence, unspecified, uncomplicated: Secondary | ICD-10-CM | POA: Insufficient documentation

## 2013-06-06 DIAGNOSIS — Z7982 Long term (current) use of aspirin: Secondary | ICD-10-CM | POA: Insufficient documentation

## 2013-06-06 DIAGNOSIS — I059 Rheumatic mitral valve disease, unspecified: Secondary | ICD-10-CM | POA: Insufficient documentation

## 2013-06-06 MED ORDER — METHYLPREDNISOLONE 4 MG PO KIT: PACK | ORAL | Status: DC

## 2013-06-06 MED ORDER — PREDNISONE 50 MG PO TABS
60.0000 mg | ORAL_TABLET | Freq: Once | ORAL | Status: AC
  Administered 2013-06-06: 60 mg via ORAL
  Filled 2013-06-06 (×2): qty 1

## 2013-06-06 MED ORDER — OXYCODONE-ACETAMINOPHEN 5-325 MG PO TABS: 2.0000 | ORAL_TABLET | ORAL | Status: DC | PRN

## 2013-06-06 MED ORDER — ONDANSETRON 4 MG PO TBDP
4.0000 mg | ORAL_TABLET | Freq: Once | ORAL | Status: AC
  Administered 2013-06-06: 4 mg via ORAL
  Filled 2013-06-06: qty 1

## 2013-06-06 MED ORDER — HYDROMORPHONE HCL PF 1 MG/ML IJ SOLN
1.0000 mg | Freq: Once | INTRAMUSCULAR | Status: AC
  Administered 2013-06-06: 1 mg via INTRAMUSCULAR
  Filled 2013-06-06: qty 1

## 2013-06-06 NOTE — ED Notes (Signed)
MD at bedside. 

## 2013-06-06 NOTE — ED Provider Notes (Signed)
CSN: [id number]     Arrival date & time 06/06/13  1750 History  This chart was scribed for Rolland Porter, MD by Blanchard Kelch, ED Scribe. The patient was seen in room MH05/MH05. Patient's care was started at 6:03 PM.     Chief Complaint  Patient presents with  . Hip Pain    Patient is a 53 y.o. female presenting with hip pain. The history is provided by the patient. No language interpreter was used.  Hip Pain Pertinent negatives include no chest pain, no abdominal pain, no headaches and no shortness of breath.    HPI Comments: [place] R Dicenzo is a 53 y.o. female who presents to the Emergency Department complaining of constant left buttocks pain that began last night. The pain radiates down the left leg to her knee. She states that she stood up last night and the pain suddenly appeared. The pain is worsened by standing and walking. She reports taking hydrocodone left over from a recent surgery without relief. She also reports getting electrotherapy from her Chiropractor this morning without relief. She denies any direct pressure or adjustment to the area of pain. She denies spasms, numbness in her lower extremities or above baseline bowel or bladder incontinence. She states that she takes Avonex injections for her history of MS.   Past Medical History  Diagnosis Date  . Multiple sclerosis   . Chronic fatigue   . Cervical dysplasia   . GERD (gastroesophageal reflux disease)   . Carpal tunnel syndrome   . Prolonged QT interval   . Hypertension   . Mitral valve prolapse    Past Surgical History  Procedure Laterality Date  . Rectovaginal fistula repair    . Abdominal hysterectomy    . Dilation and curettage of uterus    . Foot surgery Bilateral     For bunions  . Carpal tunnel release    . Bladder resuspension    . Hammer toe surgery Left   . Cervical spine surgery  10-16-2009  . Orif toe fracture Right    Family History  Problem Relation Age of Onset  . Diabetes Mother   . Melanoma  Mother   . Cancer Maternal Grandmother   . Cancer Maternal Grandfather    History  Substance Use Topics  . Smoking status: Current Every Day Smoker -- 1.00 packs/day  . Smokeless tobacco: Never Used  . Alcohol Use: No   OB History   Grav Para Term Preterm Abortions TAB SAB Ect Mult Living                 Review of Systems  Constitutional: Negative for fever, chills, diaphoresis, appetite change and fatigue.  HENT: Negative for mouth sores, sore throat and trouble swallowing.   Eyes: Negative for visual disturbance.  Respiratory: Negative for cough, chest tightness, shortness of breath and wheezing.   Cardiovascular: Negative for chest pain.  Gastrointestinal: Negative for nausea, vomiting, abdominal pain, diarrhea and abdominal distention.  Endocrine: Negative for polydipsia, polyphagia and polyuria.  Genitourinary: Negative for dysuria, frequency and hematuria.  Musculoskeletal: Negative for gait problem.       Positive for left buttocks pain.  Skin: Negative for color change, pallor and rash.  Neurological: Negative for dizziness, syncope, light-headedness and headaches.  Hematological: Does not bruise/bleed easily.  Psychiatric/Behavioral: Negative for behavioral problems and confusion.    Allergies  Demerol; Erythromycin; Morphine and related; Sulfa antibiotics; Diflucan; Other; Toviaz; and Tape  Home Medications   Current Outpatient Rx  Name  Route  Sig  Dispense  Refill  . aspirin 81 MG tablet   Oral   Take 81 mg by mouth daily.         . AVONEX PEN 30 MCG/0.5ML injection      Inject 0.585ml (30mcg) intramuscularly once a week as directed by your physician.   1 each   2   . clonazePAM (KLONOPIN) 0.25 MG disintegrating tablet   Oral   Take 1 tablet (0.25 mg total) by mouth 2 (two) times daily as needed.   60 tablet   1   . DULoxetine (CYMBALTA) 60 MG capsule      TAKE ONE CAPSULE BY MOUTH TWICE A DAY   60 capsule   3     PLEASE SCHEDULE APPT   .  ENABLEX 7.5 MG 24 hr tablet   Oral   Take 7.5 mg by mouth daily.         . furosemide (LASIX) 20 MG tablet      ONE TABLET ONCE DAILY X 3 DAYS THEN AS NEEDED FOR SWELLING OR SHORTNESS OF BREATH   30 tablet   12   . losartan (COZAAR) 50 MG tablet   Oral   Take 1 tablet (50 mg total) by mouth daily.   90 tablet   4   . methylphenidate (RITALIN) 20 MG tablet   Oral   Take 1 tablet (20 mg total) by mouth daily. At noon   30 tablet   0     Mail   . methylPREDNISolone (MEDROL DOSEPAK) 4 MG tablet      6 po on day 1, decrease by 1 tab per day   21 tablet   0   . modafinil (PROVIGIL) 200 MG tablet      TAKE 1 TABLET EVERY DAY   30 tablet   5     Pharmacy Fax [phone]   . NASONEX 50 MCG/ACT nasal spray   Nasal   Place 50 sprays into the nose daily.         Marland Kitchen. NEXIUM 40 MG capsule   Oral   Take 40 mg by mouth daily.         Marland Kitchen. oxyCODONE-acetaminophen (PERCOCET/ROXICET) 5-325 MG per tablet   Oral   Take 2 tablets by mouth every 4 (four) hours as needed.   20 tablet   0    Triage Vitals: BP 143/100  Pulse 77  Temp(Src) 98 F (36.7 C) (Oral)  Resp 20  Wt 172 lb (78.019 kg)  SpO2 100%  Physical Exam  Nursing note and vitals reviewed. Constitutional: She is oriented to person, place, and time. She appears well-developed and well-nourished. No distress.  HENT:  Head: Normocephalic and atraumatic.  Eyes: EOM are normal.  Neck: Normal range of motion.  Cardiovascular: Normal rate.   Pulmonary/Chest: Effort normal.  Musculoskeletal: Normal range of motion. She exhibits tenderness.  Tender to palpation at left sciatic notch. Positive piriformus sign. No midline or paramidline back pain or tenderness.   Neurological: She is alert and oriented to person, place, and time. She displays normal reflexes.  Normal sensation and DTRs to lower extremities  Skin: Skin is warm and dry. No rash noted.  No vesicles or zoster.   Psychiatric: She has a normal mood and  affect. Her behavior is normal.   her tenderness is clearly in her sciatic notch. She has no pain directly over the greater trochanter. She has no pain with internal or external rotation of the  hip.  ED Course  Procedures (including critical care time)  DIAGNOSTIC STUDIES: Oxygen Saturation is 100% on room air, normal by my interpretation.    COORDINATION OF CARE: 6:09 PM -Recommend follow up with physical therapist. Will order pain medication, nausea medication and steroid injection. Patient verbalizes understanding and agrees with treatment plan.    Labs Review Labs Reviewed - No data to display Imaging Review No results found.  EKG Interpretation   None       MDM   1. Sciatica    No indication for imaging. Atraumatic hip pain. History of IV drug use. No history of malignancy. History of hip abnormalities procedures or previous injuries. Exam is consistent with sciatica. Plan pain relief steroid taper physical therapy referral.  Medical screening examination/treatment/procedure(s) were performed by non-physician practitioner and as supervising physician I was immediately available for consultation/collaboration.  EKG Interpretation   None          Rolland Porter, MD 06/06/13 Rickey Primus

## 2013-06-06 NOTE — ED Notes (Signed)
Left hip pain. States she stool last night and had a pain in her hip. Unable to sleep due to pain.

## 2013-06-06 NOTE — Discharge Instructions (Signed)
The hand written prescription is for outpatient physical therapy. Contact numbers for [facility], and [facility] are included.  Sciatica Sciatica is pain, weakness, numbness, or tingling along the path of the sciatic nerve. The nerve starts in the lower back and runs down the back of each leg. The nerve controls the muscles in the lower leg and in the back of the knee, while also providing sensation to the back of the thigh, lower leg, and the sole of your foot. Sciatica is a symptom of another medical condition. For instance, nerve damage or certain conditions, such as a herniated disk or bone spur on the spine, pinch or put pressure on the sciatic nerve. This causes the pain, weakness, or other sensations normally associated with sciatica. Generally, sciatica only affects one side of the body. CAUSES   Herniated or slipped disc.  Degenerative disk disease.  A pain disorder involving the narrow muscle in the buttocks (piriformis syndrome).  Pelvic injury or fracture.  Pregnancy.  Tumor (rare). SYMPTOMS  Symptoms can vary from mild to very severe. The symptoms usually travel from the low back to the buttocks and down the back of the leg. Symptoms can include:  Mild tingling or dull aches in the lower back, leg, or hip.  Numbness in the back of the calf or sole of the foot.  Burning sensations in the lower back, leg, or hip.  Sharp pains in the lower back, leg, or hip.  Leg weakness.  Severe back pain inhibiting movement. These symptoms may get worse with coughing, sneezing, laughing, or prolonged sitting or standing. Also, being overweight may worsen symptoms. DIAGNOSIS  Your caregiver will perform a physical exam to look for common symptoms of sciatica. He or she may ask you to do certain movements or activities that would trigger sciatic nerve pain. Other tests may be performed to find the cause of the sciatica. These may include:  Blood  tests.  X-rays.  Imaging tests, such as an MRI or CT scan. TREATMENT  Treatment is directed at the cause of the sciatic pain. Sometimes, treatment is not necessary and the pain and discomfort goes away on its own. If treatment is needed, your caregiver may suggest:  Over-the-counter medicines to relieve pain.  Prescription medicines, such as anti-inflammatory medicine, muscle relaxants, or narcotics.  Applying heat or ice to the painful area.  Steroid injections to lessen pain, irritation, and inflammation around the nerve.  Reducing activity during periods of pain.  Exercising and stretching to strengthen your abdomen and improve flexibility of your spine. Your caregiver may suggest losing weight if the extra weight makes the back pain worse.  Physical therapy.  Surgery to eliminate what is pressing or pinching the nerve, such as a bone spur or part of a herniated disk. HOME CARE INSTRUCTIONS   Only take over-the-counter or prescription medicines for pain or discomfort as directed by your caregiver.  Apply ice to the affected area for 20 minutes, 3 4 times a day for the first 48 72 hours. Then try heat in the same way.  Exercise, stretch, or perform your usual activities if these do not aggravate your pain.  Attend physical therapy sessions as directed by your caregiver.  Keep all follow-up appointments as directed by your caregiver.  Do not wear high heels or shoes that do not provide proper support.  Check your mattress to see if it is too soft. A firm mattress may lessen your pain and discomfort. SEEK IMMEDIATE MEDICAL  CARE IF:   You lose control of your bowel or bladder (incontinence).  You have increasing weakness in the lower back, pelvis, buttocks, or legs.  You have redness or swelling of your back.  You have a burning sensation when you urinate.  You have pain that gets worse when you lie down or awakens you at night.  Your pain is worse than you have  experienced in the past.  Your pain is lasting longer than 4 weeks.  You are suddenly losing weight without reason. MAKE SURE YOU:  Understand these instructions.  Will watch your condition.  Will get help right away if you are not doing well or get worse. Document Released: 04/18/2001 Document Revised: 10/24/2011 Document Reviewed: 09/03/2011 [facility] Patient Information 2014 [place], [place].

## 2013-06-09 ENCOUNTER — Telehealth: Payer: Self-pay | Admitting: Neurology

## 2013-06-09 NOTE — Telephone Encounter (Signed)
Wants to let Dr. Ashley Mariner she went to the ER Thursday or Friday of last week because she was just sitting at her dinning room table and got a horrible pain her her hip and the next day she could walk. She states they told her she had a Sciatic nerve. She told them she had MS and was told to contact her Neuro Dr and report the incident.

## 2013-06-09 NOTE — Telephone Encounter (Signed)
I called the patient. The patient had onset of pain in the left buttocks and down to the knee on the left side. The patient had pain for about 24 hours, went to the ER and got steroids which seemed to help. At this point, the patient feels normal. I'll have the patient contact me if her problems continue.

## 2013-06-12 LAB — BASIC METABOLIC PANEL WITH GFR
BUN: 6 mg/dL (ref 6–23)
CO2: 29 mEq/L (ref 19–32)
CREATININE: 0.76 mg/dL (ref 0.50–1.10)
Calcium: 9.8 mg/dL (ref 8.4–10.5)
Chloride: 104 mEq/L (ref 96–112)
GLUCOSE: 98 mg/dL (ref 70–99)
Potassium: 4.2 mEq/L (ref 3.5–5.3)
Sodium: 141 mEq/L (ref 135–145)

## 2013-06-20 ENCOUNTER — Other Ambulatory Visit: Payer: Self-pay | Admitting: Neurology

## 2013-06-20 MED ORDER — METHYLPHENIDATE HCL 20 MG PO TABS: 20.0000 mg | ORAL_TABLET | Freq: Every day | ORAL | Status: DC

## 2013-06-20 NOTE — Telephone Encounter (Signed)
Called patient to inform her that her Rx was being mailed out today. I advised the patient that if she has any other problems, questions or concerns to call the office. Patient verbalized understanding.

## 2013-06-20 NOTE — Telephone Encounter (Signed)
Needs RX ritalin mailed to her home address

## 2013-06-30 ENCOUNTER — Telehealth: Payer: Self-pay | Admitting: Neurology

## 2013-06-30 ENCOUNTER — Other Ambulatory Visit: Payer: Self-pay | Admitting: Neurology

## 2013-06-30 MED ORDER — METHYLPHENIDATE HCL 20 MG PO TABS: 20.0000 mg | ORAL_TABLET | Freq: Every day | ORAL | Status: DC

## 2013-06-30 NOTE — Telephone Encounter (Signed)
Rx was mailed to the patient on 02/13.  She still has not gotten it in the mail.  Sending request to provider for approval.  Please call the patient when Rx is ready.

## 2013-06-30 NOTE — Telephone Encounter (Signed)
Called patient and left message concerning her Rx being ready to be picked up at the front desk and if she has any other problems, questions or concerns to call the office. °

## 2013-06-30 NOTE — Telephone Encounter (Signed)
Pt called needs Lynden Ang to return her call concerning the prescription methylphenidate (RITALIN) 20 MG tablet, she does have questions. Please call pt back on cell phone #.

## 2013-06-30 NOTE — Telephone Encounter (Signed)
Patient states she was told to call if she did not receive her medication through the mail. She wants a call back today

## 2013-06-30 NOTE — Telephone Encounter (Signed)
Patient called stating that she did not need the Rx that was called about today because she receive the Rx in the mail on Saturday. Patient also wanted to clarify that we had the correct address. I assured the patient that we did have the correct address. I advised the patient that if she has any other problems, questions or concerns to call the office. Patient verbalized understanding.

## 2013-07-03 ENCOUNTER — Other Ambulatory Visit: Payer: Self-pay

## 2013-07-03 MED ORDER — DULOXETINE HCL 60 MG PO CPEP: 60.0000 mg | ORAL_CAPSULE | Freq: Two times a day (BID) | ORAL | Status: DC

## 2013-07-25 ENCOUNTER — Other Ambulatory Visit: Payer: Self-pay | Admitting: Neurology

## 2013-07-25 MED ORDER — METHYLPHENIDATE HCL 20 MG PO TABS: 20.0000 mg | ORAL_TABLET | Freq: Every day | ORAL | Status: DC

## 2013-07-25 NOTE — Telephone Encounter (Signed)
Pt called in for a refill of her Ritalin.  She asked that the prescription be mailed to her.  Please call as necessary.  Thank you

## 2013-09-01 ENCOUNTER — Other Ambulatory Visit: Payer: Self-pay | Admitting: Neurology

## 2013-09-01 MED ORDER — METHYLPHENIDATE HCL 20 MG PO TABS: 20.0000 mg | ORAL_TABLET | Freq: Every day | ORAL | Status: DC

## 2013-09-01 NOTE — Telephone Encounter (Signed)
Rx has been forwarded to provider for approval

## 2013-09-01 NOTE — Telephone Encounter (Signed)
Pt called in for a refill of her Ritalin. She asked that the prescription be mailed to her. Please call pt when approved. Thanks

## 2013-09-01 NOTE — Telephone Encounter (Signed)
Called pt to inform her that her Rx was being mailed out today and if she has any other problems, questions or concerns to call the office. Pt verbalized understanding. °

## 2013-09-17 ENCOUNTER — Ambulatory Visit (INDEPENDENT_AMBULATORY_CARE_PROVIDER_SITE_OTHER): Payer: Medicare Other | Admitting: Cardiology

## 2013-09-17 ENCOUNTER — Encounter: Payer: Self-pay | Admitting: Cardiology

## 2013-09-17 VITALS — BP 152/90 | HR 88 | Ht 63.0 in | Wt 167.0 lb

## 2013-09-17 DIAGNOSIS — Z720 Tobacco use: Secondary | ICD-10-CM

## 2013-09-17 DIAGNOSIS — I059 Rheumatic mitral valve disease, unspecified: Secondary | ICD-10-CM

## 2013-09-17 DIAGNOSIS — R9431 Abnormal electrocardiogram [ECG] [EKG]: Secondary | ICD-10-CM

## 2013-09-17 DIAGNOSIS — F17200 Nicotine dependence, unspecified, uncomplicated: Secondary | ICD-10-CM

## 2013-09-17 DIAGNOSIS — I10 Essential (primary) hypertension: Secondary | ICD-10-CM

## 2013-09-17 DIAGNOSIS — I341 Nonrheumatic mitral (valve) prolapse: Secondary | ICD-10-CM

## 2013-09-17 DIAGNOSIS — R609 Edema, unspecified: Secondary | ICD-10-CM

## 2013-09-17 MED ORDER — LOSARTAN POTASSIUM 100 MG PO TABS: 100.0000 mg | ORAL_TABLET | Freq: Every day | ORAL | Status: DC

## 2013-09-17 NOTE — Progress Notes (Signed)
HPI: FU hypertension, prolonged QT and mitral valve prolapse. Previously followed in [place] [place]. Holter monitor in September of 2012 showed sinus rhythm with rare PVC. Echocardiogram in April of 2013 showed normal LV function. There was mild mitral regurgitation. Last seen in Jan 2015. She noticed increased edema following surgery and Lasix was added as needed. Since then, She denies dyspnea, chest pain or syncope. Her pedal edema persists but is improved.   Current Outpatient Prescriptions  Medication Sig Dispense Refill  . aspirin 81 MG tablet Take 81 mg by mouth daily.      . AVONEX PEN 30 MCG/0.5ML injection Inject 0.425ml (30mcg) intramuscularly once a week as directed by your physician.  1 each  2  . clonazePAM (KLONOPIN) 0.25 MG disintegrating tablet Take 1 tablet (0.25 mg total) by mouth 2 (two) times daily as needed.  60 tablet  1  . DULoxetine (CYMBALTA) 60 MG capsule Take 1 capsule (60 mg total) by mouth 2 (two) times daily.  180 capsule  3  . ENABLEX 7.5 MG 24 hr tablet Take 7.5 mg by mouth daily.      . furosemide (LASIX) 20 MG tablet ONE TABLET ONCE DAILY X 3 DAYS THEN AS NEEDED FOR SWELLING OR SHORTNESS OF BREATH Pt states she is taking this everyday now (09/17/13)      . gabapentin (NEURONTIN) 100 MG capsule Take 100 mg by mouth as needed.      . gabapentin (NEURONTIN) 300 MG capsule Take 300 mg by mouth as needed.      Marland Kitchen. losartan (COZAAR) 50 MG tablet Take 1 tablet (50 mg total) by mouth daily.  90 tablet  4  . methylphenidate (RITALIN) 20 MG tablet Take 1 tablet (20 mg total) by mouth daily. At noon  30 tablet  0  . modafinil (PROVIGIL) 200 MG tablet TAKE 1 TABLET EVERY DAY  30 tablet  5  . NASONEX 50 MCG/ACT nasal spray Place 50 sprays into the nose daily.      Marland Kitchen. NEXIUM 40 MG capsule Take 40 mg by mouth daily.       No current facility-administered medications for this visit.     Past Medical History  Diagnosis Date  . Multiple sclerosis   . Chronic fatigue   .  Cervical dysplasia   . GERD (gastroesophageal reflux disease)   . Carpal tunnel syndrome   . Prolonged QT interval   . Hypertension   . Mitral valve prolapse     Past Surgical History  Procedure Laterality Date  . Rectovaginal fistula repair    . Abdominal hysterectomy    . Dilation and curettage of uterus    . Foot surgery Bilateral     For bunions  . Carpal tunnel release    . Bladder resuspension    . Hammer toe surgery Left   . Cervical spine surgery  10-16-2009  . Orif toe fracture Right     History   Social History  . Marital Status: Divorced    Spouse Name: N/A    Number of Children: 2  . Years of Education: 13   Occupational History  . Unemployed     Disability   Social History Main Topics  . Smoking status: Current Every Day Smoker -- 1.00 packs/day  . Smokeless tobacco: Never Used  . Alcohol Use: No  . Drug Use: No  . Sexual Activity: Not on file   Other Topics Concern  . Not on file  Social History Narrative  . No narrative on file    ROS: no fevers or chills, productive cough, hemoptysis, dysphasia, odynophagia, melena, hematochezia, dysuria, hematuria, rash, seizure activity, orthopnea, PND, pedal edema, claudication. Remaining systems are negative.  Physical Exam: Well-developed well-nourished in no acute distress.  Skin is warm and dry.  HEENT is normal.  Neck is supple.  Chest is clear to auscultation with normal expansion.  Cardiovascular exam is regular rate and rhythm.  Abdominal exam nontender or distended. No masses palpated. Extremities show no edema. neuro grossly intact  ECG Sinus rhythm at a rate of 88. Prolonged QT. Nonspecific ST changes.

## 2013-09-17 NOTE — Assessment & Plan Note (Signed)
Repeat echocardiogram. 

## 2013-09-17 NOTE — Assessment & Plan Note (Signed)
Patient counseled on discontinuing. 

## 2013-09-17 NOTE — Assessment & Plan Note (Signed)
No history of syncope. ?

## 2013-09-17 NOTE — Patient Instructions (Signed)
Your physician recommends that you schedule a follow-up appointment in: 3 MONTHS WITH DR Jens Som  Your physician has requested that you have an echocardiogram. Echocardiography is a painless test that uses sound waves to create images of your heart. It provides your doctor with information about the size and shape of your heart and how well your heart's chambers and valves are working. This procedure takes approximately one hour. There are no restrictions for this procedure.   INCREASE LOSARTAN TO 100 MG ONCE DAILY  Your physician recommends that you return for lab work in: ONE WEEK

## 2013-09-17 NOTE — Assessment & Plan Note (Signed)
Patient continues to describe edema. It has improved. Continue Lasix. Check potassium, renal function and BNP. Repeat echocardiogram.

## 2013-09-17 NOTE — Assessment & Plan Note (Signed)
Blood pressure elevated. Increase Cozaar to 100 mg daily. Continue Lasix.

## 2013-09-25 LAB — BASIC METABOLIC PANEL WITH GFR
BUN: 5 mg/dL — ABNORMAL LOW (ref 6–23)
CALCIUM: 9.4 mg/dL (ref 8.4–10.5)
CO2: 32 meq/L (ref 19–32)
Chloride: 103 mEq/L (ref 96–112)
Creat: 0.7 mg/dL (ref 0.50–1.10)
GFR, Est Non African American: >89 mL/min
Glucose, Bld: 77 mg/dL (ref 70–99)
POTASSIUM: 3.9 meq/L (ref 3.5–5.3)
SODIUM: 142 meq/L (ref 135–145)

## 2013-09-25 LAB — BRAIN NATRIURETIC PEPTIDE: BRAIN NATRIURETIC PEPTIDE: 23.1 pg/mL (ref 0.0–100.0)

## 2013-10-06 ENCOUNTER — Other Ambulatory Visit (HOSPITAL_COMMUNITY): Payer: Medicare Other

## 2013-10-06 ENCOUNTER — Other Ambulatory Visit: Payer: Self-pay | Admitting: Neurology

## 2013-10-06 MED ORDER — METHYLPHENIDATE HCL 20 MG PO TABS: 20.0000 mg | ORAL_TABLET | Freq: Every day | ORAL | Status: DC

## 2013-10-06 NOTE — Telephone Encounter (Signed)
Patient calling to request Ritalin refill to be mailed to her.

## 2013-10-06 NOTE — Telephone Encounter (Signed)
Dr Anne Hahn is out of the office today, forwarding request to [facility] for approval.

## 2013-10-06 NOTE — Telephone Encounter (Signed)
Pt's Rx was mailed out today. °

## 2013-10-08 ENCOUNTER — Other Ambulatory Visit: Payer: Self-pay | Admitting: Neurology

## 2013-10-08 MED ORDER — METHYLPHENIDATE HCL 20 MG PO TABS: 20.0000 mg | ORAL_TABLET | Freq: Every day | ORAL | Status: DC

## 2013-10-08 NOTE — Telephone Encounter (Signed)
Needs RX for methylphenidate (RITALIN) 20 MG tablet mailed to her. Patient upset states this is her second time calling that she called in on Monday. I advised her I did not see a request in but will be doing it now for her. Please call her when it is mailed out.

## 2013-10-08 NOTE — Telephone Encounter (Signed)
Request forwarded to provider for approval  

## 2013-10-09 NOTE — Telephone Encounter (Signed)
Pt's Rx was mailed out today. °

## 2013-10-13 ENCOUNTER — Telehealth: Payer: Self-pay | Admitting: Cardiology

## 2013-10-13 NOTE — Telephone Encounter (Signed)
Returning Summer Henderson's message re: Losartan.  States since Losartan was doubled she has experienced dizziness when she stands up suddenly.  States "it's no big deal" and she isn't going to change her medication.  She would like for Stanton Kidney to call her when she is back in office. Will forward to [employer].

## 2013-10-13 NOTE — Telephone Encounter (Signed)
New message    Patient calling stating Summer Henderson sent her a message regarding side effect of medication.

## 2013-10-17 ENCOUNTER — Ambulatory Visit (HOSPITAL_COMMUNITY): Payer: Medicare Other | Attending: Cardiovascular Disease | Admitting: Cardiology

## 2013-10-17 DIAGNOSIS — F17200 Nicotine dependence, unspecified, uncomplicated: Secondary | ICD-10-CM | POA: Diagnosis not present

## 2013-10-17 DIAGNOSIS — I509 Heart failure, unspecified: Secondary | ICD-10-CM | POA: Insufficient documentation

## 2013-10-17 DIAGNOSIS — I10 Essential (primary) hypertension: Secondary | ICD-10-CM | POA: Diagnosis not present

## 2013-10-17 DIAGNOSIS — I341 Nonrheumatic mitral (valve) prolapse: Secondary | ICD-10-CM

## 2013-10-17 DIAGNOSIS — R609 Edema, unspecified: Secondary | ICD-10-CM

## 2013-10-17 DIAGNOSIS — I059 Rheumatic mitral valve disease, unspecified: Secondary | ICD-10-CM

## 2013-10-17 NOTE — Progress Notes (Signed)
Echo performed. 

## 2013-10-20 NOTE — Telephone Encounter (Signed)
Reviewed results of echo with pt who states understanding.  She would ike Debra to call her back when she is back in the office.

## 2013-10-20 NOTE — Telephone Encounter (Signed)
Patient is returning call for test results. Please call back.

## 2013-10-23 ENCOUNTER — Ambulatory Visit: Payer: Medicare Other | Admitting: Neurology

## 2013-11-03 NOTE — Telephone Encounter (Signed)
Left message for pt to call.

## 2013-12-05 ENCOUNTER — Other Ambulatory Visit: Payer: Self-pay | Admitting: Neurology

## 2013-12-05 MED ORDER — METHYLPHENIDATE HCL 20 MG PO TABS: 20.0000 mg | ORAL_TABLET | Freq: Every day | ORAL | Status: DC

## 2013-12-05 NOTE — Telephone Encounter (Signed)
Patient requesting refill script of Ritalin mailed to her, please call when it has been mailed.

## 2013-12-05 NOTE — Telephone Encounter (Signed)
Request forwarded to provider for approval  

## 2013-12-05 NOTE — Telephone Encounter (Signed)
Pt's Rx was mailed out today. °

## 2013-12-12 ENCOUNTER — Emergency Department (HOSPITAL_BASED_OUTPATIENT_CLINIC_OR_DEPARTMENT_OTHER)
Admission: EM | Admit: 2013-12-12 | Discharge: 2013-12-12 | Disposition: A | Discharge status: Home / Self Care | Payer: Medicare Other | Attending: Emergency Medicine | Admitting: Emergency Medicine

## 2013-12-12 ENCOUNTER — Encounter (HOSPITAL_BASED_OUTPATIENT_CLINIC_OR_DEPARTMENT_OTHER): Payer: Self-pay | Admitting: Emergency Medicine

## 2013-12-12 ENCOUNTER — Emergency Department (HOSPITAL_BASED_OUTPATIENT_CLINIC_OR_DEPARTMENT_OTHER): Payer: Medicare Other

## 2013-12-12 DIAGNOSIS — F17200 Nicotine dependence, unspecified, uncomplicated: Secondary | ICD-10-CM | POA: Diagnosis not present

## 2013-12-12 DIAGNOSIS — Z7982 Long term (current) use of aspirin: Secondary | ICD-10-CM | POA: Insufficient documentation

## 2013-12-12 DIAGNOSIS — G35 Multiple sclerosis: Secondary | ICD-10-CM | POA: Diagnosis not present

## 2013-12-12 DIAGNOSIS — R071 Chest pain on breathing: Secondary | ICD-10-CM | POA: Diagnosis not present

## 2013-12-12 DIAGNOSIS — K219 Gastro-esophageal reflux disease without esophagitis: Secondary | ICD-10-CM | POA: Insufficient documentation

## 2013-12-12 DIAGNOSIS — R0789 Other chest pain: Secondary | ICD-10-CM

## 2013-12-12 DIAGNOSIS — R05 Cough: Secondary | ICD-10-CM | POA: Diagnosis not present

## 2013-12-12 DIAGNOSIS — R059 Cough, unspecified: Secondary | ICD-10-CM | POA: Insufficient documentation

## 2013-12-12 DIAGNOSIS — Z79899 Other long term (current) drug therapy: Secondary | ICD-10-CM | POA: Diagnosis not present

## 2013-12-12 DIAGNOSIS — I10 Essential (primary) hypertension: Secondary | ICD-10-CM | POA: Diagnosis not present

## 2013-12-12 DIAGNOSIS — R079 Chest pain, unspecified: Secondary | ICD-10-CM | POA: Diagnosis present

## 2013-12-12 DIAGNOSIS — Z8742 Personal history of other diseases of the female genital tract: Secondary | ICD-10-CM | POA: Insufficient documentation

## 2013-12-12 MED ORDER — IBUPROFEN 800 MG PO TABS: 800.0000 mg | ORAL_TABLET | Freq: Three times a day (TID) | ORAL | Status: DC

## 2013-12-12 MED ORDER — NYSTATIN 100000 UNIT/ML MT SUSP: 500000.0000 [IU] | Freq: Four times a day (QID) | OROMUCOSAL | Status: DC

## 2013-12-12 MED ORDER — PREDNISONE 10 MG PO TABS
ORAL_TABLET | ORAL | Status: DC
Start: 2013-12-12 — End: 2013-12-17

## 2013-12-12 NOTE — ED Notes (Signed)
Pt states "my rib is dislocated again"-pain to left rib area-increase pain with movement and when sleeps on that side x 5 days

## 2013-12-12 NOTE — ED Provider Notes (Signed)
Medical screening examination/treatment/procedure(s) were conducted as a shared visit with non-physician practitioner(s) or resident and myself. I personally evaluated the patient during the encounter and agree with the findings.  I have personally reviewed any xrays and/ or EKG's with the provider and I agree with interpretation.  Patient with MS, fatigue, high blood pressure, reflux history presents with left rib pain worse with raising left arm, palpation breathing for the past 4-5 days. Patient has had this once in the past where she had a dislocated and fractured rib. Patient denies recent injury however she has had a mild cough the past 4-5 days. No fevers or chills. On exam patient has focal tenderness left mid posterior flank, no step-off appreciated, no rash appreciated, lungs clear bilateral, and nontoxic appearing. Plan for x-ray and pain meds.  X-ray reviewed no acute fracture, old fracture visualized. Pain meds and supportive care. Spur on her period  Left rib contusion   Enid SkeensJoshua M Shirl Ludington, MD 12/12/13 [phone]

## 2013-12-12 NOTE — ED Provider Notes (Signed)
CSN: [id number]     Arrival date & time 12/12/13  1408 History   First MD Initiated Contact with Patient 12/12/13 1425     Chief Complaint  Patient presents with  . Rib Injury     (Consider location/radiation/quality/duration/timing/severity/associated sxs/prior Treatment) Patient is a 53 y.o. female presenting with chest pain. The history is provided by the patient. No language interpreter was used.  Chest Pain Pain location:  L chest Pain quality: sharp   Pain radiates to:  Does not radiate Associated symptoms: cough   Associated symptoms: no abdominal pain, no fever, no nausea, no shortness of breath and not vomiting   Associated symptoms comment:  Sharp chest pain that has been progressing over a period of several days. She states she first noticed it when lying on the left side and now feels it with deep breathing without SOB, movement or pressure. She reports a chronic cough and history of rib fracture with an episode of bronchitis last year. She states it feels the same. No fever, vomiting, abdominal pain or flank pain.   Past Medical History  Diagnosis Date  . Multiple sclerosis   . Chronic fatigue   . Cervical dysplasia   . GERD (gastroesophageal reflux disease)   . Carpal tunnel syndrome   . Prolonged QT interval   . Hypertension   . Mitral valve prolapse    Past Surgical History  Procedure Laterality Date  . Rectovaginal fistula repair    . Abdominal hysterectomy    . Dilation and curettage of uterus    . Foot surgery Bilateral     For bunions  . Carpal tunnel release    . Bladder resuspension    . Hammer toe surgery Left   . Cervical spine surgery  10-16-2009  . Orif toe fracture Right    Family History  Problem Relation Age of Onset  . Diabetes Mother   . Melanoma Mother   . Cancer Maternal Grandmother   . Cancer Maternal Grandfather    History  Substance Use Topics  . Smoking status: Current Every Day Smoker -- 1.00 packs/day  . Smokeless tobacco:  Never Used  . Alcohol Use: No   OB History   Grav Para Term Preterm Abortions TAB SAB Ect Mult Living                 Review of Systems  Constitutional: Negative for fever.  HENT: Negative for congestion.   Respiratory: Positive for cough. Negative for shortness of breath.   Cardiovascular: Positive for chest pain.  Gastrointestinal: Negative for nausea, vomiting and abdominal pain.  Genitourinary: Negative for dysuria.  Musculoskeletal: Negative for myalgias.  Skin: Negative for rash.      Allergies  Demerol; Erythromycin; Morphine and related; Sulfa antibiotics; Diflucan; Other; Toviaz; and Tape  Home Medications   Prior to Admission medications   Medication Sig Start Date End Date Taking? Authorizing Provider  oxyCODONE-acetaminophen (PERCOCET/ROXICET) 5-325 MG per tablet Take by mouth every 4 (four) hours as needed for severe pain.   Yes Historical Provider, MD  aspirin 81 MG tablet Take 81 mg by mouth daily.    Historical Provider, MD  AVONEX PEN 30 MCG/0.5ML injection Inject 0.805ml (30mcg) intramuscularly once a week as directed by your physician. 05/30/13   York Spanielharles K Willis, MD  clonazePAM (KLONOPIN) 0.25 MG disintegrating tablet Take 1 tablet (0.25 mg total) by mouth 2 (two) times daily as needed. 10/11/12   York Spanielharles K Willis, MD  DULoxetine (CYMBALTA) 60 MG capsule  Take 1 capsule (60 mg total) by mouth 2 (two) times daily. 07/03/13   York Spaniel, MD  ENABLEX 7.5 MG 24 hr tablet Take 7.5 mg by mouth daily. 09/17/12   Historical Provider, MD  furosemide (LASIX) 20 MG tablet ONE TABLET ONCE DAILY X 3 DAYS THEN AS NEEDED FOR SWELLING OR SHORTNESS OF BREATH Pt states she is taking this everyday now (09/17/13) 06/04/13   Lewayne Bunting, MD  gabapentin (NEURONTIN) 100 MG capsule Take 100 mg by mouth as needed.    Historical Provider, MD  gabapentin (NEURONTIN) 300 MG capsule Take 300 mg by mouth as needed.    Historical Provider, MD  losartan (COZAAR) 100 MG tablet Take 1 tablet  (100 mg total) by mouth daily. 09/17/13   Lewayne Bunting, MD  methylphenidate (RITALIN) 20 MG tablet Take 1 tablet (20 mg total) by mouth daily. At noon 12/05/13   York Spaniel, MD  modafinil (PROVIGIL) 200 MG tablet TAKE 1 TABLET EVERY DAY 04/19/13   York Spaniel, MD  NASONEX 50 MCG/ACT nasal spray Place 50 sprays into the nose daily. 09/16/12   Historical Provider, MD  NEXIUM 40 MG capsule Take 40 mg by mouth daily. 09/18/12   Historical Provider, MD   BP 139/90  Pulse 95  Temp(Src) 98.3 F (36.8 C) (Oral)  Resp 20  Ht 5\' 2"  (1.575 m)  Wt 165 lb (74.844 kg)  BMI 30.17 kg/m2  SpO2 100% Physical Exam  Constitutional: She is oriented to person, place, and time. She appears well-developed and well-nourished.  HENT:  Head: Normocephalic.  Neck: Normal range of motion. Neck supple.  Cardiovascular: Normal rate and regular rhythm.   Pulmonary/Chest: Effort normal and breath sounds normal. She has no wheezes. She has no rales. She exhibits tenderness.  Left lateral wall tenderness to palpation. No discoloration or deformity.   Abdominal: Soft. Bowel sounds are normal. There is no tenderness. There is no rebound and no guarding.  Genitourinary:  No CVA tenderness.   Musculoskeletal: Normal range of motion.  Neurological: She is alert and oriented to person, place, and time.  Skin: Skin is warm and dry. No rash noted.  Psychiatric: She has a normal mood and affect.    ED Course  Procedures (including critical care time) Labs Review Labs Reviewed - No data to display  Imaging Review Dg Ribs Unilateral W/chest Left  12/12/2013   CLINICAL DATA:  Left-sided rib pain.  EXAM: LEFT RIBS AND CHEST - 3+ VIEW  COMPARISON:  Chest radiographs 10/05/2009  FINDINGS: The cardiomediastinal silhouette is within normal limits. Linear opacity in the left lung base is unchanged, likely scarring. Mild central bronchitic changes are again seen. No pleural effusion or pneumothorax is identified. There  is an old, healed left posterior lateral ninth rib fracture. No acute rib fracture is identified.  IMPRESSION: Old left ninth rib fracture.  No acute fracture identified.   Electronically Signed   By: Sebastian Ache   On: 12/12/2013 14:39     EKG Interpretation None      MDM   Final diagnoses:  None    1. Chest wall pain  No acute rib fracture on x-ray. No SOB, tachycardia - doubt PE. Atypical presentation for cardiac pain. Suspect chest wall pain that is musculoskeletal in nature. Percocet and ibuprofen provided. Suggest follow up with PCP for recheck in 2-3 days.     Arnoldo Hooker, PA-C 12/12/13 [phone]

## 2013-12-12 NOTE — Discharge Instructions (Signed)
CONTINUE PERCOCET AT HOME FOR PAIN, 1-2 EVERY 4-6 HOURS AS NEEDED. ADD IBUPROFEN EVERY 8 HOURS. WARM COMPRESSES. FOLLOW UP WITH DR. VELAZQUEZ FOR RECHECK IN -3 DAYS.   Chest Wall Pain Chest wall pain is pain in or around the bones and muscles of your chest. It may take up to 6 weeks to get better. It may take longer if you must stay physically active in your work and activities.  CAUSES  Chest wall pain may happen on its own. However, it may be caused by:  A viral illness like the flu.  Injury.  Coughing.  Exercise.  Arthritis.  Fibromyalgia.  Shingles. HOME CARE INSTRUCTIONS   Avoid overtiring physical activity. Try not to strain or perform activities that cause pain. This includes any activities using your chest or your abdominal and side muscles, especially if heavy weights are used.  Put ice on the sore area.  Put ice in a plastic bag.  Place a towel between your skin and the bag.  Leave the ice on for 15-20 minutes per hour while awake for the first 2 days.  Only take over-the-counter or prescription medicines for pain, discomfort, or fever as directed by your caregiver. SEEK IMMEDIATE MEDICAL CARE IF:   Your pain increases, or you are very uncomfortable.  You have a fever.  Your chest pain becomes worse.  You have new, unexplained symptoms.  You have nausea or vomiting.  You feel sweaty or lightheaded.  You have a cough with phlegm (sputum), or you cough up blood. MAKE SURE YOU:   Understand these instructions.  Will watch your condition.  Will get help right away if you are not doing well or get worse. Document Released: 04/24/2005 Document Revised: 07/17/2011 Document Reviewed: 12/19/2010 [facility] Patient Information 2015 [place], [place]. This information is not intended to replace advice given to you by your health care provider. Make sure you discuss any questions you have with your health care provider. Heat Therapy Heat therapy can help ease  sore, stiff, injured, and tight muscles and joints. Heat relaxes your muscles, which may help ease your pain.  RISKS AND COMPLICATIONS If you have any of the following conditions, do not use heat therapy unless your health care provider has approved:  Poor circulation.  Healing wounds or scarred skin in the area being treated.  Diabetes, heart disease, or high blood pressure.  Not being able to feel (numbness) the area being treated.  Unusual swelling of the area being treated.  Active infections.  Blood clots.  Cancer.  Inability to communicate pain. This may include young children and people who have problems with their brain function (dementia).  Pregnancy. Heat therapy should only be used on old, pre-existing, or long-lasting (chronic) injuries. Do not use heat therapy on new injuries unless directed by your health care provider. HOW TO USE HEAT THERAPY There are several different kinds of heat therapy, including:  Moist heat pack.  Warm water bath.  Hot water bottle.  Electric heating pad.  Heated gel pack.  Heated wrap.  Electric heating pad. Use the heat therapy method suggested by your health care provider. Follow your health care provider's instructions on when and how to use heat therapy. GENERAL HEAT THERAPY RECOMMENDATIONS  Do not sleep while using heat therapy. Only use heat therapy while you are awake.  Your skin may turn pink while using heat therapy. Do not use heat therapy if your skin turns red.  Do not use heat therapy if you have new  pain.  High heat or long exposure to heat can cause burns. Be careful when using heat therapy to avoid burning your skin.  Do not use heat therapy on areas of your skin that are already irritated, such as with a rash or sunburn. SEEK MEDICAL CARE IF:  You have blisters, redness, swelling, or numbness.  You have new pain.  Your pain is worse. MAKE SURE YOU:  Understand these instructions.  Will watch your  condition.  Will get help right away if you are not doing well or get worse. Document Released: 07/17/2011 Document Revised: 09/08/2013 Document Reviewed: 06/17/2013 [facility] Patient Information 2015 [place]. This information is not intended to replace advice given to you by your health care provider. Make sure you discuss any questions you have with your health care provider.

## 2013-12-17 ENCOUNTER — Encounter: Payer: Self-pay | Admitting: Cardiology

## 2013-12-17 ENCOUNTER — Ambulatory Visit (INDEPENDENT_AMBULATORY_CARE_PROVIDER_SITE_OTHER): Payer: Medicare Other | Admitting: Cardiology

## 2013-12-17 VITALS — BP 134/74 | HR 89 | Ht 62.0 in | Wt 167.1 lb

## 2013-12-17 DIAGNOSIS — Z720 Tobacco use: Secondary | ICD-10-CM

## 2013-12-17 DIAGNOSIS — I341 Nonrheumatic mitral (valve) prolapse: Secondary | ICD-10-CM

## 2013-12-17 DIAGNOSIS — I059 Rheumatic mitral valve disease, unspecified: Secondary | ICD-10-CM

## 2013-12-17 DIAGNOSIS — F17200 Nicotine dependence, unspecified, uncomplicated: Secondary | ICD-10-CM

## 2013-12-17 DIAGNOSIS — I10 Essential (primary) hypertension: Secondary | ICD-10-CM

## 2013-12-17 LAB — BASIC METABOLIC PANEL WITH GFR
BUN: 5 mg/dL — ABNORMAL LOW (ref 6–23)
CO2: 28 meq/L (ref 19–32)
Calcium: 8.8 mg/dL (ref 8.4–10.5)
Chloride: 102 mEq/L (ref 96–112)
Creat: 0.73 mg/dL (ref 0.50–1.10)
GFR, Est Non African American: >89 mL/min
Glucose, Bld: 81 mg/dL (ref 70–99)
Potassium: 2.9 mEq/L — ABNORMAL LOW (ref 3.5–5.3)
SODIUM: 140 meq/L (ref 135–145)

## 2013-12-17 MED ORDER — FUROSEMIDE 20 MG PO TABS: 20.0000 mg | ORAL_TABLET | Freq: Every day | ORAL | Status: DC

## 2013-12-17 MED ORDER — FUROSEMIDE 20 MG PO TABS
20.0000 mg | ORAL_TABLET | Freq: Every day | ORAL | Status: DC
Start: 2013-12-17 — End: 2013-12-17

## 2013-12-17 MED ORDER — LOSARTAN POTASSIUM 100 MG PO TABS: 100.0000 mg | ORAL_TABLET | Freq: Every day | ORAL | Status: DC

## 2013-12-17 NOTE — Assessment & Plan Note (Signed)
Blood pressure controlled. Continue present medications. 

## 2013-12-17 NOTE — Patient Instructions (Signed)
Your physician wants you to follow-up in: 6 MONTHS WITH DR CRENSHAW You will receive a reminder letter in the mail two months in advance. If you don't receive a letter, please call our office to schedule the follow-up appointment.  

## 2013-12-17 NOTE — Assessment & Plan Note (Signed)
Patient counseled on discontinuing. 

## 2013-12-17 NOTE — Assessment & Plan Note (Signed)
No history of syncope. Avoid medications that prolong QT interval.

## 2013-12-17 NOTE — Progress Notes (Signed)
HPI: FU hypertension, prolonged QT and mitral valve prolapse. [facility] Holter monitor in September of 2012 showed sinus rhythm with rare PVC. Recently complained of edema and Lasix added. Echocardiogram in June of 2015 showed normal LV function and mild left atrial enlargement. There was trace mitral regurgitation. Patient seen recently in the emergency room for chest wall pain. Treated with pain medications. Since last seen She has mild dyspnea on exertion. She has pain in the left rib area. No palpitations or syncope.   Current Outpatient Prescriptions  Medication Sig Dispense Refill  . aspirin 81 MG tablet Take 81 mg by mouth daily.      . AVONEX PEN 30 MCG/0.5ML injection Inject 0.425ml (30mcg) intramuscularly once a week as directed by your physician.  1 each  2  . clonazePAM (KLONOPIN) 0.25 MG disintegrating tablet Take 1 tablet (0.25 mg total) by mouth 2 (two) times daily as needed.  60 tablet  1  . DULoxetine (CYMBALTA) 60 MG capsule Take 1 capsule (60 mg total) by mouth 2 (two) times daily.  180 capsule  3  . ENABLEX 7.5 MG 24 hr tablet Take 7.5 mg by mouth daily.      . furosemide (LASIX) 20 MG tablet Take 20 mg by mouth daily. ONE TABLET ONCE DAILY X 3 DAYS THEN AS NEEDED FOR SWELLING OR SHORTNESS OF BREATH Pt states she is taking this everyday now (09/17/13)      . gabapentin (NEURONTIN) 100 MG capsule Take 100 mg by mouth as needed.      . gabapentin (NEURONTIN) 300 MG capsule Take 300 mg by mouth as needed.      Marland Kitchen. ibuprofen (ADVIL,MOTRIN) 800 MG tablet Take 1 tablet (800 mg total) by mouth 3 (three) times daily.  21 tablet  0  . losartan (COZAAR) 100 MG tablet Take 1 tablet (100 mg total) by mouth daily.  90 tablet  4  . methylphenidate (RITALIN) 20 MG tablet Take 1 tablet (20 mg total) by mouth daily. At noon  30 tablet  0  . modafinil (PROVIGIL) 200 MG tablet TAKE 1 TABLET EVERY DAY  30 tablet  5  . NASONEX 50 MCG/ACT nasal spray Place 50 sprays into  the nose daily.      Marland Kitchen. NEXIUM 40 MG capsule Take 40 mg by mouth daily.      Marland Kitchen. oxyCODONE-acetaminophen (PERCOCET/ROXICET) 5-325 MG per tablet Take by mouth every 4 (four) hours as needed for severe pain.      Marland Kitchen. nystatin (MYCOSTATIN) 100000 UNIT/ML suspension Take 5 mLs (500,000 Units total) by mouth 4 (four) times daily.  60 mL  0   No current facility-administered medications for this visit.     Past Medical History  Diagnosis Date  . Multiple sclerosis   . Chronic fatigue   . Cervical dysplasia   . GERD (gastroesophageal reflux disease)   . Carpal tunnel syndrome   . Prolonged QT interval   . Hypertension   . Mitral valve prolapse     Past Surgical History  Procedure Laterality Date  . Rectovaginal fistula repair    . Abdominal hysterectomy    . Dilation and curettage of uterus    . Foot surgery Bilateral     For bunions  . Carpal tunnel release    . Bladder resuspension    . Hammer toe surgery Left   . Cervical spine surgery  10-16-2009  . Orif toe fracture Right  History   Social History  . Marital Status: Divorced    Spouse Name: N/A    Number of Children: 2  . Years of Education: 13   Occupational History  . Unemployed     Disability   Social History Main Topics  . Smoking status: Current Every Day Smoker -- 1.00 packs/day  . Smokeless tobacco: Never Used  . Alcohol Use: No  . Drug Use: No  . Sexual Activity: Not on file   Other Topics Concern  . Not on file   Social History Narrative  . No narrative on file    ROS: Fatigue but no fevers or chills, productive cough, hemoptysis, dysphasia, odynophagia, melena, hematochezia, dysuria, hematuria, rash, seizure activity, orthopnea, PND, pedal edema, claudication. Remaining systems are negative.  Physical Exam: Well-developed well-nourished in no acute distress.  Skin is warm and dry.  HEENT is normal.  Neck is supple.  Chest is clear to auscultation with normal expansion.  Cardiovascular exam is  regular rate and rhythm.  Abdominal exam nontender or distended. No masses palpated. Extremities show no edema. neuro grossly intact  ECG Sinus rhythm at a rate of 90. Prolonged QT interval.

## 2013-12-17 NOTE — Assessment & Plan Note (Signed)
Not evident on most recent echo.

## 2013-12-17 NOTE — Assessment & Plan Note (Signed)
Patient continues to describe intermittent edema. There is no edema on examination. Continue present dose of Lasix. Check potassium and renal function.

## 2013-12-18 ENCOUNTER — Other Ambulatory Visit: Payer: Self-pay | Admitting: Neurology

## 2013-12-22 ENCOUNTER — Other Ambulatory Visit: Payer: Self-pay | Admitting: *Deleted

## 2013-12-22 DIAGNOSIS — E876 Hypokalemia: Secondary | ICD-10-CM

## 2013-12-22 MED ORDER — POTASSIUM CHLORIDE CRYS ER 20 MEQ PO TBCR: EXTENDED_RELEASE_TABLET | ORAL | Status: DC

## 2013-12-23 LAB — BASIC METABOLIC PANEL WITH GFR
BUN: 3 mg/dL — ABNORMAL LOW (ref 6–23)
CO2: 27 mEq/L (ref 19–32)
Calcium: 9.2 mg/dL (ref 8.4–10.5)
Chloride: 106 mEq/L (ref 96–112)
Creat: 0.66 mg/dL (ref 0.50–1.10)
GFR, Est African American: >89 mL/min
Glucose, Bld: 77 mg/dL (ref 70–99)
POTASSIUM: 4.5 meq/L (ref 3.5–5.3)
SODIUM: 140 meq/L (ref 135–145)

## 2013-12-24 ENCOUNTER — Telehealth: Payer: Self-pay | Admitting: Cardiology

## 2013-12-24 NOTE — Telephone Encounter (Signed)
Follow Up ° ° ° °Pt returning call from earlier regarding test results. Please call. °

## 2013-12-24 NOTE — Telephone Encounter (Signed)
Spoke with pt, aware of recent lab results.

## 2013-12-31 DIAGNOSIS — F330 Major depressive disorder, recurrent, mild: Secondary | ICD-10-CM | POA: Insufficient documentation

## 2013-12-31 DIAGNOSIS — J449 Chronic obstructive pulmonary disease, unspecified: Secondary | ICD-10-CM | POA: Insufficient documentation

## 2013-12-31 DIAGNOSIS — F17200 Nicotine dependence, unspecified, uncomplicated: Secondary | ICD-10-CM | POA: Insufficient documentation

## 2013-12-31 DIAGNOSIS — M792 Neuralgia and neuritis, unspecified: Secondary | ICD-10-CM | POA: Insufficient documentation

## 2013-12-31 DIAGNOSIS — K219 Gastro-esophageal reflux disease without esophagitis: Secondary | ICD-10-CM | POA: Insufficient documentation

## 2013-12-31 DIAGNOSIS — R928 Other abnormal and inconclusive findings on diagnostic imaging of breast: Secondary | ICD-10-CM | POA: Insufficient documentation

## 2013-12-31 DIAGNOSIS — H905 Unspecified sensorineural hearing loss: Secondary | ICD-10-CM | POA: Insufficient documentation

## 2013-12-31 DIAGNOSIS — I059 Rheumatic mitral valve disease, unspecified: Secondary | ICD-10-CM | POA: Insufficient documentation

## 2013-12-31 DIAGNOSIS — R102 Pelvic and perineal pain: Secondary | ICD-10-CM | POA: Insufficient documentation

## 2013-12-31 DIAGNOSIS — N6452 Nipple discharge: Secondary | ICD-10-CM | POA: Insufficient documentation

## 2013-12-31 DIAGNOSIS — F419 Anxiety disorder, unspecified: Secondary | ICD-10-CM | POA: Insufficient documentation

## 2013-12-31 DIAGNOSIS — N6019 Diffuse cystic mastopathy of unspecified breast: Secondary | ICD-10-CM | POA: Insufficient documentation

## 2013-12-31 DIAGNOSIS — N898 Other specified noninflammatory disorders of vagina: Secondary | ICD-10-CM | POA: Insufficient documentation

## 2013-12-31 DIAGNOSIS — M7750 Other enthesopathy of unspecified foot: Secondary | ICD-10-CM | POA: Insufficient documentation

## 2013-12-31 DIAGNOSIS — R32 Unspecified urinary incontinence: Secondary | ICD-10-CM | POA: Insufficient documentation

## 2013-12-31 DIAGNOSIS — M1990 Unspecified osteoarthritis, unspecified site: Secondary | ICD-10-CM | POA: Insufficient documentation

## 2013-12-31 DIAGNOSIS — R339 Retention of urine, unspecified: Secondary | ICD-10-CM | POA: Insufficient documentation

## 2013-12-31 DIAGNOSIS — I079 Rheumatic tricuspid valve disease, unspecified: Secondary | ICD-10-CM | POA: Insufficient documentation

## 2013-12-31 DIAGNOSIS — I5189 Other ill-defined heart diseases: Secondary | ICD-10-CM | POA: Insufficient documentation

## 2013-12-31 DIAGNOSIS — G479 Sleep disorder, unspecified: Secondary | ICD-10-CM | POA: Insufficient documentation

## 2013-12-31 DIAGNOSIS — G5600 Carpal tunnel syndrome, unspecified upper limb: Secondary | ICD-10-CM | POA: Insufficient documentation

## 2013-12-31 DIAGNOSIS — J309 Allergic rhinitis, unspecified: Secondary | ICD-10-CM | POA: Insufficient documentation

## 2013-12-31 DIAGNOSIS — N319 Neuromuscular dysfunction of bladder, unspecified: Secondary | ICD-10-CM | POA: Insufficient documentation

## 2013-12-31 DIAGNOSIS — R3989 Other symptoms and signs involving the genitourinary system: Secondary | ICD-10-CM | POA: Insufficient documentation

## 2013-12-31 DIAGNOSIS — J410 Simple chronic bronchitis: Secondary | ICD-10-CM | POA: Insufficient documentation

## 2013-12-31 DIAGNOSIS — M8580 Other specified disorders of bone density and structure, unspecified site: Secondary | ICD-10-CM | POA: Insufficient documentation

## 2014-01-07 ENCOUNTER — Other Ambulatory Visit: Payer: Self-pay | Admitting: Neurology

## 2014-01-07 MED ORDER — METHYLPHENIDATE HCL 20 MG PO TABS: 20.0000 mg | ORAL_TABLET | Freq: Every day | ORAL | Status: DC

## 2014-01-07 NOTE — Telephone Encounter (Signed)
Patient calling to request written refill of Ritalin script to be mailed to her, please call patient and advise when it has been mailed.

## 2014-01-07 NOTE — Telephone Encounter (Signed)
Request forwarded to provider for approval  

## 2014-01-07 NOTE — Telephone Encounter (Signed)
Called pt to inform her that her Rx was ready to be picked up at the front desk and if she has any other problems, questions or concerns to call the office. Pt stated that her Rx was mailed out to her, I explained to the pt that we can no longer mail out schedule II drugs and that she would need to come to the office to pick up her Rx. I advised the pt that if she has any other problems, questions or concerns to call the office. Pt verbalized understanding.

## 2014-02-11 ENCOUNTER — Telehealth: Payer: Self-pay | Admitting: *Deleted

## 2014-02-11 MED ORDER — METHYLPHENIDATE HCL 20 MG PO TABS: 20.0000 mg | ORAL_TABLET | Freq: Every day | ORAL | Status: DC

## 2014-02-11 NOTE — Telephone Encounter (Signed)
I will write the prescription. 

## 2014-02-11 NOTE — Telephone Encounter (Signed)
Patient calling requesting a refill for Ritalin 20 mg. Patient would like for Rx to be mailed to her home, also request a cal back on the status of when Rx is mailed out.

## 2014-02-12 NOTE — Telephone Encounter (Signed)
Called pt to inform her that her Rx was ready to be picked up at the front desk and if she has any other problems, questions or concerns to call the office. Pt stated that she does not have a way to come and get the Rx, I explained to the pt that we are unable to mail any schedule II drugs, that the pt has to pick of the Rx from the office. Pt verbalized understanding.

## 2014-03-09 DIAGNOSIS — M779 Enthesopathy, unspecified: Secondary | ICD-10-CM

## 2014-03-09 DIAGNOSIS — M778 Other enthesopathies, not elsewhere classified: Secondary | ICD-10-CM | POA: Insufficient documentation

## 2014-03-15 ENCOUNTER — Encounter (HOSPITAL_BASED_OUTPATIENT_CLINIC_OR_DEPARTMENT_OTHER): Payer: Self-pay | Admitting: *Deleted

## 2014-03-15 ENCOUNTER — Emergency Department (HOSPITAL_BASED_OUTPATIENT_CLINIC_OR_DEPARTMENT_OTHER)
Admission: EM | Admit: 2014-03-15 | Discharge: 2014-03-15 | Disposition: A | Discharge status: Home / Self Care | Payer: Medicare Other | Attending: Emergency Medicine | Admitting: Emergency Medicine

## 2014-03-15 DIAGNOSIS — I10 Essential (primary) hypertension: Secondary | ICD-10-CM | POA: Diagnosis not present

## 2014-03-15 DIAGNOSIS — M5431 Sciatica, right side: Secondary | ICD-10-CM | POA: Insufficient documentation

## 2014-03-15 DIAGNOSIS — G35 Multiple sclerosis: Secondary | ICD-10-CM | POA: Diagnosis not present

## 2014-03-15 DIAGNOSIS — Z720 Tobacco use: Secondary | ICD-10-CM | POA: Insufficient documentation

## 2014-03-15 DIAGNOSIS — Z79899 Other long term (current) drug therapy: Secondary | ICD-10-CM | POA: Insufficient documentation

## 2014-03-15 DIAGNOSIS — Z7982 Long term (current) use of aspirin: Secondary | ICD-10-CM | POA: Diagnosis not present

## 2014-03-15 DIAGNOSIS — K219 Gastro-esophageal reflux disease without esophagitis: Secondary | ICD-10-CM | POA: Diagnosis not present

## 2014-03-15 DIAGNOSIS — R110 Nausea: Secondary | ICD-10-CM | POA: Diagnosis not present

## 2014-03-15 DIAGNOSIS — M25551 Pain in right hip: Secondary | ICD-10-CM | POA: Diagnosis present

## 2014-03-15 MED ORDER — CYCLOBENZAPRINE HCL 10 MG PO TABS
10.0000 mg | ORAL_TABLET | Freq: Once | ORAL | Status: AC
  Administered 2014-03-15: 10 mg via ORAL
  Filled 2014-03-15: qty 1

## 2014-03-15 MED ORDER — PREDNISONE 10 MG PO TABS: 20.0000 mg | ORAL_TABLET | Freq: Two times a day (BID) | ORAL | Status: DC

## 2014-03-15 MED ORDER — CYCLOBENZAPRINE HCL 10 MG PO TABS: 10.0000 mg | ORAL_TABLET | Freq: Two times a day (BID) | ORAL | Status: DC | PRN

## 2014-03-15 MED ORDER — OXYCODONE-ACETAMINOPHEN 5-325 MG PO TABS: 1.0000 | ORAL_TABLET | ORAL | Status: DC | PRN

## 2014-03-15 MED ORDER — OXYCODONE-ACETAMINOPHEN 5-325 MG PO TABS
1.0000 | ORAL_TABLET | Freq: Once | ORAL | Status: AC
  Administered 2014-03-15: 1 via ORAL
  Filled 2014-03-15: qty 1

## 2014-03-15 NOTE — ED Notes (Signed)
Right hip pain since Friday- no injury-- hx of sciatica

## 2014-03-15 NOTE — ED Provider Notes (Signed)
CSN: [id number]     Arrival date & time 03/15/14  1419 History   First MD Initiated Contact with Patient 03/15/14 1451     Chief Complaint  Patient presents with  . Hip Pain     (Consider location/radiation/quality/duration/timing/severity/associated sxs/prior Treatment) Patient is a 53 y.o. female presenting with hip pain. The history is provided by the patient.  Hip Pain This is a new problem. The current episode started in the past 7 days. The problem occurs constantly. The problem has been gradually worsening. Associated symptoms include nausea (earlier but none now). The symptoms are aggravated by standing, twisting and walking. She has tried oral narcotics, rest and lying down for the symptoms. The treatment provided mild relief.   Summer Henderson is a 53 y.o. female who presents to the ED with right hip pain that started 4 days ago. She states that she went to her mother's house and vacuumed and felt pain in her lower back and then later the right hip started hurting. The patient states she had something similar to this about a year ago and the doctor told her she had sciatica. She found Percocet left over from then and took some last night and it felt some better but she didn't have any more and the pain got worse. She denies UTI symptoms.  Past Medical History  Diagnosis Date  . Multiple sclerosis   . Chronic fatigue   . Cervical dysplasia   . GERD (gastroesophageal reflux disease)   . Carpal tunnel syndrome   . Prolonged QT interval   . Hypertension   . Mitral valve prolapse    Past Surgical History  Procedure Laterality Date  . Rectovaginal fistula repair    . Abdominal hysterectomy    . Dilation and curettage of uterus    . Foot surgery Bilateral     For bunions  . Carpal tunnel release    . Bladder resuspension    . Hammer toe surgery Left   . Cervical spine surgery  10-16-2009  . Orif toe fracture Right    Family History  Problem Relation Age of Onset  . Diabetes  Mother   . Melanoma Mother   . Cancer Maternal Grandmother   . Cancer Maternal Grandfather    History  Substance Use Topics  . Smoking status: Current Every Day Smoker -- 1.00 packs/day    Types: Cigarettes  . Smokeless tobacco: Never Used  . Alcohol Use: No   OB History    No data available     Review of Systems  Gastrointestinal: Positive for nausea (earlier but none now).  Musculoskeletal: Positive for back pain.       Right hip pain  all other systems negative    Allergies  Demerol; Erythromycin; Morphine and related; Sulfa antibiotics; Diflucan; Other; Toviaz; and Tape  Home Medications   Prior to Admission medications   Medication Sig Start Date End Date Taking? Authorizing Provider  aspirin 81 MG tablet Take 81 mg by mouth daily.   Yes Historical Provider, MD  AVONEX PEN 30 MCG/0.5ML injection Inject 0.65ml ( ) intramuscularly once a week as directed by your physician. 12/18/13  Yes York Spaniel, MD  clonazePAM (KLONOPIN) 0.25 MG disintegrating tablet Take 1 tablet (0.25 mg total) by mouth 2 (two) times daily as needed. 10/11/12  Yes York Spaniel, MD  DULoxetine (CYMBALTA) 60 MG capsule Take 1 capsule (60 mg total) by mouth 2 (two) times daily. 07/03/13  Yes York Spaniel, MD  ENABLEX 7.5 MG 24 hr tablet Take 7.5 mg by mouth daily. 09/17/12  Yes Historical Provider, MD  furosemide (LASIX) 20 MG tablet Take 1 tablet (20 mg total) by mouth daily. ONE TABLET ONCE DAILY X 3 DAYS THEN AS NEEDED FOR SWELLING OR SHORTNESS OF BREATH 12/17/13  Yes Lewayne BuntingBrian S Crenshaw, MD  gabapentin (NEURONTIN) 100 MG capsule Take 100 mg by mouth as needed.   Yes Historical Provider, MD  gabapentin (NEURONTIN) 300 MG capsule Take 300 mg by mouth as needed.   Yes Historical Provider, MD  ibuprofen (ADVIL,MOTRIN) 800 MG tablet Take 1 tablet (800 mg total) by mouth 3 (three) times daily. 12/12/13  Yes Shari A Upstill, PA-C  losartan (COZAAR) 100 MG tablet Take 1 tablet (100 mg total) by mouth  daily. 12/17/13  Yes Lewayne BuntingBrian S Crenshaw, MD  methylphenidate (RITALIN) 20 MG tablet Take 1 tablet (20 mg total) by mouth daily. At noon 02/11/14  Yes York Spanielharles K Willis, MD  modafinil (PROVIGIL) 200 MG tablet TAKE 1 TABLET EVERY DAY 04/19/13  Yes York Spanielharles K Willis, MD  NASONEX 50 MCG/ACT nasal spray Place 50 sprays into the nose daily. 09/16/12  Yes Historical Provider, MD  NEXIUM 40 MG capsule Take 40 mg by mouth daily. 09/18/12  Yes Historical Provider, MD  nystatin (MYCOSTATIN) 100000 UNIT/ML suspension Take 5 mLs (500,000 Units total) by mouth 4 (four) times daily. 12/12/13  Yes Shari A Upstill, PA-C  oxyCODONE-acetaminophen (PERCOCET/ROXICET) 5-325 MG per tablet Take by mouth every 4 (four) hours as needed for severe pain.   Yes Historical Provider, MD  potassium chloride SA (K-DUR,KLOR-CON) 20 MEQ tablet 2 tablets now and 2 tablets in 4 hours then one tablet every time you take furosemide 12/22/13  Yes Lewayne BuntingBrian S Crenshaw, MD   BP 156/85 mmHg  Pulse 104  Temp(Src) 98 F (36.7 C) (Oral)  Resp 20  SpO2 100% Physical Exam  Constitutional: She is oriented to person, place, and time. She appears well-developed and well-nourished. No distress.  HENT:  Head: Normocephalic and atraumatic.  Right Ear: Tympanic membrane normal.  Left Ear: Tympanic membrane normal.  Nose: Nose normal.  Mouth/Throat: Uvula is midline, oropharynx is clear and moist and mucous membranes are normal.  Eyes: EOM are normal.  Neck: Normal range of motion. Neck supple.  Cardiovascular: Normal rate and regular rhythm.   Pulmonary/Chest: Effort normal. She has no wheezes. She has no rales.  Abdominal: Soft. Bowel sounds are normal. There is no tenderness.  Musculoskeletal: Normal range of motion.       Lumbar back: She exhibits tenderness, pain and spasm. She exhibits normal pulse.       Back:  Tender with palpation over right sciatic nerve. Pedal pulses equal, adequate circulation, good touch sensation.   Neurological: She is  alert and oriented to person, place, and time. She has normal strength. No cranial nerve deficit or sensory deficit. Gait normal.  Reflex Scores:      Bicep reflexes are 2+ on the right side and 2+ on the left side.      Brachioradialis reflexes are 2+ on the right side and 2+ on the left side.      Patellar reflexes are 2+ on the right side and 2+ on the left side.      Achilles reflexes are 2+ on the right side and 2+ on the left side. Skin: Skin is warm and dry.  Psychiatric: She has a normal mood and affect. Her behavior is normal.  Nursing note and  vitals reviewed.   ED Course  Procedures   MDM  53 y.o. female with pain to the right buttock x 3 days. Stable for discharge without neuro deficits. Discussed with the patient and all questioned fully answered. She will return if any problems arise.    Medication List    TAKE these medications        cyclobenzaprine 10 MG tablet  Commonly known as:  FLEXERIL  Take 1 tablet (10 mg total) by mouth 2 (two) times daily as needed for muscle spasms.     oxyCODONE-acetaminophen 5-325 MG per tablet  Commonly known as:  ROXICET  Take 1 tablet by mouth every 4 (four) hours as needed for severe pain.     predniSONE 10 MG tablet  Commonly known as:  DELTASONE  Take 2 tablets (20 mg total) by mouth 2 (two) times daily with a meal.      ASK your doctor about these medications        aspirin 81 MG tablet  Take 81 mg by mouth daily.     AVONEX PEN 30 MCG/0.5ML injection  Generic drug:  interferon beta-1a  Inject 0.12ml ( ) intramuscularly once a week as directed by your physician.     clonazePAM 0.25 MG disintegrating tablet  Commonly known as:  KLONOPIN  Take 1 tablet (0.25 mg total) by mouth 2 (two) times daily as needed.     DULoxetine 60 MG capsule  Commonly known as:  CYMBALTA  Take 1 capsule (60 mg total) by mouth 2 (two) times daily.     ENABLEX 7.5 MG 24 hr tablet  Generic drug:  darifenacin  Take 7.5 mg by mouth daily.      furosemide 20 MG tablet  Commonly known as:  LASIX  Take 1 tablet (20 mg total) by mouth daily. ONE TABLET ONCE DAILY X 3 DAYS THEN AS NEEDED FOR SWELLING OR SHORTNESS OF BREATH     gabapentin 300 MG capsule  Commonly known as:  NEURONTIN  Take 300 mg by mouth as needed.     gabapentin 100 MG capsule  Commonly known as:  NEURONTIN  Take 100 mg by mouth as needed.     ibuprofen 800 MG tablet  Commonly known as:  ADVIL,MOTRIN  Take 1 tablet (800 mg total) by mouth 3 (three) times daily.     losartan 100 MG tablet  Commonly known as:  COZAAR  Take 1 tablet (100 mg total) by mouth daily.     methylphenidate 20 MG tablet  Commonly known as:  RITALIN  Take 1 tablet (20 mg total) by mouth daily. At noon     modafinil 200 MG tablet  Commonly known as:  PROVIGIL  TAKE 1 TABLET EVERY DAY     NASONEX 50 MCG/ACT nasal spray  Generic drug:  mometasone  Place 50 sprays into the nose daily.     NEXIUM 40 MG capsule  Generic drug:  esomeprazole  Take 40 mg by mouth daily.     nystatin 100000 UNIT/ML suspension  Commonly known as:  MYCOSTATIN  Take 5 mLs (500,000 Units total) by mouth 4 (four) times daily.     potassium chloride SA 20 MEQ tablet  Commonly known as:  K-DUR,KLOR-CON  2 tablets now and 2 tablets in 4 hours then one tablet every time you take furosemide           Janne Napoleon, NP 03/15/14 1930  Gilda Crease, MD 03/19/14 [phone]

## 2014-03-19 ENCOUNTER — Other Ambulatory Visit: Payer: Self-pay | Admitting: Neurology

## 2014-03-19 MED ORDER — METHYLPHENIDATE HCL 20 MG PO TABS: 20.0000 mg | ORAL_TABLET | Freq: Every day | ORAL | Status: DC

## 2014-03-19 NOTE — Telephone Encounter (Signed)
I called the patient to let them know their Rx for Ritalin was ready for pickup. Patient was instructed to bring Photo ID. 

## 2014-03-19 NOTE — Telephone Encounter (Signed)
Patient requesting Rx refill for methylphenidate (RITALIN) 20 MG tablet.  Please call when ready for pick up. °   °

## 2014-03-19 NOTE — Telephone Encounter (Signed)
Request entered, forwarded to provider for approval.  Patient has appt scheduled next month.

## 2014-03-19 NOTE — Telephone Encounter (Signed)
I called the patient after her friend showed up to pick up her Rx for Ritalin.  The patient is giving up permission for Oneal Deputy (DOB: [birth year]) to pick up her Rx for Ritalin. I personally had Tiera to sign for patients medication.

## 2014-04-16 ENCOUNTER — Encounter: Payer: Self-pay | Admitting: Neurology

## 2014-04-16 ENCOUNTER — Ambulatory Visit (INDEPENDENT_AMBULATORY_CARE_PROVIDER_SITE_OTHER): Payer: Medicare Other | Admitting: Neurology

## 2014-04-16 VITALS — BP 161/95 | HR 95

## 2014-04-16 DIAGNOSIS — G35 Multiple sclerosis: Secondary | ICD-10-CM

## 2014-04-16 DIAGNOSIS — R269 Unspecified abnormalities of gait and mobility: Secondary | ICD-10-CM

## 2014-04-16 DIAGNOSIS — Z5181 Encounter for therapeutic drug level monitoring: Secondary | ICD-10-CM

## 2014-04-16 DIAGNOSIS — G479 Sleep disorder, unspecified: Secondary | ICD-10-CM

## 2014-04-16 MED ORDER — METHYLPHENIDATE HCL 20 MG PO TABS: 20.0000 mg | ORAL_TABLET | Freq: Every day | ORAL | Status: DC

## 2014-04-16 NOTE — Progress Notes (Signed)
Reason for visit: Multiple sclerosis  Summer Henderson is a 53 y.o. female  History of present illness:  Summer Henderson is a 53 year old right-handed white female with a history of multiple sclerosis. The patient has had no definite exacerbations with her multiple sclerosis since last seen, but she indicates that she has not been taking her Avonex properly, injecting every other week because of side effects associated with the drug. The patient is having a lot of flulike symptoms. She continues to have bladder dysfunction, she was told that she needed in an out catheterizations from her urology doctor. She has some gait instability, and occasional stumbles without falls. He has a lot of fatigue, and she indicates that she feels quite tired and fatigued even after she wakes up in the morning, and she indicates that she does snore at night. The patient reports no new numbness, weakness, or visual changes. She returns to this office for further evaluation. The last MRI of the brain done in June 2014 shows a right parietal enhancing lesion. A repeat MRI was ordered, but the patient never had the study done. The patient is reporting some issues with right-sided sciatica within the last several weeks.  Past Medical History  Diagnosis Date  . Multiple sclerosis   . Chronic fatigue   . Cervical dysplasia   . GERD (gastroesophageal reflux disease)   . Carpal tunnel syndrome   . Prolonged QT interval   . Hypertension   . Mitral valve prolapse     Past Surgical History  Procedure Laterality Date  . Rectovaginal fistula repair    . Abdominal hysterectomy    . Dilation and curettage of uterus    . Foot surgery Bilateral     For bunions  . Carpal tunnel release    . Bladder resuspension    . Hammer toe surgery Left   . Cervical spine surgery  10-16-2009  . Orif toe fracture Right   . Biceps tendon repair      Family History  Problem Relation Age of Onset  . Diabetes Mother   . Melanoma Mother     . Cancer Maternal Grandmother   . Cancer Maternal Grandfather     Social history:  reports that she has been smoking Cigarettes.  She has been smoking about 1.00 pack per day. She has never used smokeless tobacco. She reports that she does not drink alcohol or use illicit drugs.  Medications:  Current Outpatient Prescriptions on File Prior to Visit  Medication Sig Dispense Refill  . aspirin 81 MG tablet Take 81 mg by mouth daily.    . clonazePAM (KLONOPIN) 0.25 MG disintegrating tablet Take 1 tablet (0.25 mg total) by mouth 2 (two) times daily as needed. 60 tablet 1  . cyclobenzaprine (FLEXERIL) 10 MG tablet Take 1 tablet (10 mg total) by mouth 2 (two) times daily as needed for muscle spasms. 20 tablet 0  . DULoxetine (CYMBALTA) 60 MG capsule Take 1 capsule (60 mg total) by mouth 2 (two) times daily. 180 capsule 3  . ENABLEX 7.5 MG 24 hr tablet Take 7.5 mg by mouth daily.    . furosemide (LASIX) 20 MG tablet Take 1 tablet (20 mg total) by mouth daily. ONE TABLET ONCE DAILY X 3 DAYS THEN AS NEEDED FOR SWELLING OR SHORTNESS OF BREATH 90 tablet 3  . gabapentin (NEURONTIN) 100 MG capsule Take 100 mg by mouth as needed.    . gabapentin (NEURONTIN) 300 MG capsule Take 300 mg  by mouth as needed.    Marland Kitchen. ibuprofen (ADVIL,MOTRIN) 800 MG tablet Take 1 tablet (800 mg total) by mouth 3 (three) times daily. (Patient taking differently: Take 800 mg by mouth as needed. ) 21 tablet 0  . losartan (COZAAR) 100 MG tablet Take 1 tablet (100 mg total) by mouth daily. 90 tablet 4  . modafinil (PROVIGIL) 200 MG tablet TAKE 1 TABLET EVERY DAY 30 tablet 5  . NASONEX 50 MCG/ACT nasal spray Place 50 sprays into the nose daily.    Marland Kitchen. NEXIUM 40 MG capsule Take 40 mg by mouth daily.    Marland Kitchen. nystatin (MYCOSTATIN) 100000 UNIT/ML suspension Take 5 mLs (500,000 Units total) by mouth 4 (four) times daily. 60 mL 0  . oxyCODONE-acetaminophen (ROXICET) 5-325 MG per tablet Take 1 tablet by mouth every 4 (four) hours as needed for severe  pain. 16 tablet 0  . potassium chloride SA (K-DUR,KLOR-CON) 20 MEQ tablet 2 tablets now and 2 tablets in 4 hours then one tablet every time you take furosemide 90 tablet 3   No current facility-administered medications on file prior to visit.      Allergies  Allergen Reactions  . Demerol [Meperidine]     Other reaction(s): Other (See Comments) [derm] rash, swelling, itching  . Erythromycin   . Morphine And Related   . Sulfa Antibiotics   . Diflucan [Fluconazole]   . Other     Other reaction(s): Other (See Comments) Z-pak causes EKG changes, was told by cardiologist not to take this.  Gala Murdoch. Toviaz [Fesoterodine Fumarate Er]   . Tape     Adhesive tape    ROS:  Out of a complete 14 system review of symptoms, the patient complains only of the following symptoms, and all other reviewed systems are negative.  Activity change, fatigue Cough Constipation Restless legs, snoring, sleep talking Incontinence of the bladder, frequency of urination, urgency Back pain, right-sided sciatica Itching Bruising easily Memory loss, weakness Depression, anxiety  Blood pressure 161/95, pulse 95.   Physical Exam  General: The patient is alert and cooperative at the time of the examination.  Skin: No significant peripheral edema is noted.   Neurologic Exam  Mental status: The patient is oriented x 3.  Cranial nerves: Facial symmetry is present. Speech is normal, no aphasia or dysarthria is noted. Extraocular movements are full. Visual fields are full. Pupils are equal, round, and reactive to light. Discs are flat bilaterally.   Motor: The patient has good strength in all 4 extremities.  Sensory examination: Soft touch sensation is symmetric on the face, arms and legs.  Coordination: The patient has good finger-nose-finger and heel-to-shin bilaterally.  Gait and station: The patient has a normal gait. Tandem gait is slightly unsteady.  Romberg is negative. No drift is  seen.  Reflexes: Deep tendon reflexes are symmetric.    Assessment/Plan:  1. Multiple sclerosis  2. Right sided sciatica  3. Chronic fatigue  The patient is not tolerating the Avonex injections. She will be switched to Tecfidera at this time, and blood work will be done today. She will follow-up in 6 months, and we will consider a repeat MRI of the brain at that time. She will be sent for a sleep study to evaluate for the excessive daytime drowsiness and fatigue. The patient will contact our office if new issues arise. She will come off of the Avonex.   Marlan Palau. Keith Willis MD 04/16/2014 8:49 PM  [facility] [address] [place], [place]  [address]  Phone [phone] Fax [phone]

## 2014-04-16 NOTE — Patient Instructions (Signed)

## 2014-04-17 ENCOUNTER — Telehealth: Payer: Self-pay | Admitting: Neurology

## 2014-04-17 DIAGNOSIS — R0683 Snoring: Secondary | ICD-10-CM

## 2014-04-17 DIAGNOSIS — E669 Obesity, unspecified: Secondary | ICD-10-CM

## 2014-04-17 DIAGNOSIS — G4719 Other hypersomnia: Secondary | ICD-10-CM

## 2014-04-17 DIAGNOSIS — G35 Multiple sclerosis: Secondary | ICD-10-CM

## 2014-04-17 LAB — COMPREHENSIVE METABOLIC PANEL
ALT: 32 IU/L (ref 0–32)
AST: 28 IU/L (ref 0–40)
Albumin/Globulin Ratio: 1.4 (ref 1.1–2.5)
Albumin: 4.1 g/dL (ref 3.5–5.5)
Alkaline Phosphatase: 184 IU/L — ABNORMAL HIGH (ref 39–117)
BUN/Creatinine Ratio: 6 — ABNORMAL LOW (ref 9–23)
BUN: 5 mg/dL — AB (ref 6–24)
CALCIUM: 9.5 mg/dL (ref 8.7–10.2)
CO2: 24 mmol/L (ref 18–29)
CREATININE: 0.83 mg/dL (ref 0.57–1.00)
Chloride: 105 mmol/L (ref 97–108)
GFR calc Af Amer: 94 mL/min/{1.73_m2} (ref >59)
GFR calc non Af Amer: 82 mL/min/{1.73_m2} (ref >59)
Globulin, Total: 2.9 g/dL (ref 1.5–4.5)
Glucose: 85 mg/dL (ref 65–99)
Potassium: 4.3 mmol/L (ref 3.5–5.2)
Sodium: 142 mmol/L (ref 134–144)
TOTAL PROTEIN: 7 g/dL (ref 6.0–8.5)
Total Bilirubin: 0.3 mg/dL (ref 0.0–1.2)

## 2014-04-17 LAB — CBC WITH DIFFERENTIAL
BASOS: 0 %
Basophils Absolute: 0 10*3/uL (ref 0.0–0.2)
EOS: 0 %
Eosinophils Absolute: 0 10*3/uL (ref 0.0–0.4)
HCT: 41.2 % (ref 34.0–46.6)
HEMOGLOBIN: 13.9 g/dL (ref 11.1–15.9)
IMMATURE GRANS (ABS): 0 10*3/uL (ref 0.0–0.1)
Immature Granulocytes: 0 %
Lymphocytes Absolute: 3.5 10*3/uL — ABNORMAL HIGH (ref 0.7–3.1)
Lymphs: 36 %
MCH: 28.4 pg (ref 26.6–33.0)
MCHC: 33.7 g/dL (ref 31.5–35.7)
MCV: 84 fL (ref 79–97)
Monocytes Absolute: 0.4 10*3/uL (ref 0.1–0.9)
Monocytes: 4 %
NEUTROS PCT: 60 %
Neutrophils Absolute: 5.7 10*3/uL (ref 1.4–7.0)
Platelets: 427 10*3/uL — ABNORMAL HIGH (ref 150–379)
RBC: 4.9 x10E6/uL (ref 3.77–5.28)
RDW: 14.9 % (ref 12.3–15.4)
WBC: 9.6 10*3/uL (ref 3.4–10.8)

## 2014-04-17 NOTE — Telephone Encounter (Signed)
Dr. Nanetta Batty patient for attended sleep study.  Height: 5\' 2"   Weight: 167 lb 1.9 oz  BMI: 30.56  Past Medical History:  Multiple sclerosis    . Chronic fatigue   . Cervical dysplasia   . GERD (gastroesophageal reflux disease)   . Carpal tunnel syndrome   . Prolonged QT interval   . Hypertension   . Mitral valve prolapse      Sleep Symptoms: She indicates that she feels quite tired and fatigued even after she wakes up in the morning, and she indicates that she does snore at night, and Restless legs, sleep talking   Epworth Score: Unknown   Medication:  Aspirin (Tab) aspirin 81 MG Take 81 mg by mouth daily.       ClonazePAM (Tablet Dispersible) KLONOPIN 0.25 MG Take 1 tablet (0.25 mg total) by mouth 2 (two) times daily as needed.      Cyclobenzaprine HCl (Tab) FLEXERIL 10 MG Take 1 tablet (10 mg total) by mouth 2 (two) times daily as needed for muscle spasms.      DULoxetine HCl (Cap DR Particles) CYMBALTA 60 MG Take 1 capsule (60 mg total) by mouth 2 (two) times daily.      Darifenacin Hydrobromide (Tablet SR 24 hr) ENABLEX 7.5 MG Take 7.5 mg by mouth daily.      Esomeprazole Magnesium (Capsule Delayed Release) NEXIUM 40 MG Take 40 mg by mouth daily.      Furosemide (Tab) LASIX 20 MG Take 1 tablet (20 mg total) by mouth daily. ONE TABLET ONCE DAILY X 3 DAYS THEN AS NEEDED FOR SWELLING OR SHORTNESS OF BREATH      Gabapentin (Cap) NEURONTIN 300 MG Take 300 mg by mouth as needed.      Gabapentin (Cap) NEURONTIN 100 MG Take 100 mg by mouth as needed.      Ibuprofen (Tab) ADVIL,MOTRIN 800 MG Take 1 tablet (800 mg total) by mouth 3 (three) times daily.      Losartan Potassium (Tab) COZAAR 100 MG Take 1 tablet (100 mg total) by mouth daily.      Methylphenidate HCl (Tab) RITALIN 20 MG Take 1 tablet (20 mg total) by mouth daily. At noon      Modafinil (Tab) PROVIGIL 200 MG TAKE 1 TABLET EVERY DAY      Mometasone Furoate  (Suspension) NASONEX 50 MCG/ACT Place 50 sprays into the nose daily.      Nystatin (Suspension) MYCOSTATIN 100000 UNIT/ML Take 5 mLs (500,000 Units total) by mouth 4 (four) times daily.      Oxycodone-Acetaminophen (Tab) PERCOCET/ROXICET 5-325 MG Take 1 tablet by mouth every 4 (four) hours as needed for severe pain.      Potassium Chloride Crys CR (Tab CR) K-DUR,KLOR-CON 20 MEQ 2 tablets now and 2 tablets in 4 hours then one tablet every time you take furosemide         Ins: [employer]/Medicaid   Assessment & Plan:  1. Multiple sclerosis  2. Right sided sciatica  3. Chronic fatigue  The patient is not tolerating the Avonex injections. She will be switched to Tecfidera at this time, and blood work will be done today. She will follow-up in 6 months, and we will consider a repeat MRI of the brain at that time. She will be sent for a sleep study to evaluate for the excessive daytime drowsiness and fatigue. The patient will contact our office if new issues arise. She will come off of the Avonex.    Please review patient  information and submit instructions for scheduling and orders for sleep technologist. Thank you.

## 2014-04-17 NOTE — Telephone Encounter (Signed)
Sleep study request review: This patient has an underlying medical history of multiple sclerosis, obesity, reflux disease, hypertension, mitral valve prolapse and prolonged QT and is referred by Dr. Anne Hahn for an attended sleep study due to a report of snoring, excessive daytime somnolence, and restless leg symptoms. I will order a split-night sleep study and see the patient in sleep medicine consultation afterwards. Please print this note and attach to chart.   Technologist instructions: Please score at 4% and split if 2 hour estimated AHI >20/h.    Huston Foley, MD, PhD [facility])

## 2014-04-20 ENCOUNTER — Encounter (HOSPITAL_BASED_OUTPATIENT_CLINIC_OR_DEPARTMENT_OTHER): Payer: Self-pay

## 2014-04-20 ENCOUNTER — Emergency Department (HOSPITAL_BASED_OUTPATIENT_CLINIC_OR_DEPARTMENT_OTHER)
Admission: EM | Admit: 2014-04-20 | Discharge: 2014-04-21 | Disposition: A | Discharge status: Home / Self Care | Payer: Medicare Other | Source: Home / Self Care | Attending: Emergency Medicine | Admitting: Emergency Medicine

## 2014-04-20 ENCOUNTER — Emergency Department (HOSPITAL_BASED_OUTPATIENT_CLINIC_OR_DEPARTMENT_OTHER): Payer: Medicare Other

## 2014-04-20 DIAGNOSIS — K625 Hemorrhage of anus and rectum: Secondary | ICD-10-CM

## 2014-04-20 DIAGNOSIS — R109 Unspecified abdominal pain: Secondary | ICD-10-CM

## 2014-04-20 DIAGNOSIS — N898 Other specified noninflammatory disorders of vagina: Secondary | ICD-10-CM | POA: Diagnosis not present

## 2014-04-20 DIAGNOSIS — N760 Acute vaginitis: Secondary | ICD-10-CM | POA: Diagnosis not present

## 2014-04-20 LAB — URINALYSIS, ROUTINE W REFLEX MICROSCOPIC
BILIRUBIN URINE: NEGATIVE
Glucose, UA: NEGATIVE mg/dL
Hgb urine dipstick: NEGATIVE
KETONES UR: NEGATIVE mg/dL
Leukocytes, UA: NEGATIVE
Nitrite: NEGATIVE
PROTEIN: NEGATIVE mg/dL
Specific Gravity, Urine: 1.004 — ABNORMAL LOW (ref 1.005–1.030)
Urobilinogen, UA: 0.2 mg/dL (ref 0.0–1.0)
pH: 7 (ref 5.0–8.0)

## 2014-04-20 LAB — CBC WITH DIFFERENTIAL/PLATELET
Basophils Absolute: 0 10*3/uL (ref 0.0–0.1)
Basophils Relative: 0 % (ref 0–1)
EOS ABS: 0.1 10*3/uL (ref 0.0–0.7)
EOS PCT: 1 % (ref 0–5)
HCT: 38.8 % (ref 36.0–46.0)
Hemoglobin: 13.3 g/dL (ref 12.0–15.0)
LYMPHS ABS: 4.3 10*3/uL — AB (ref 0.7–4.0)
Lymphocytes Relative: 29 % (ref 12–46)
MCH: 28.6 pg (ref 26.0–34.0)
MCHC: 34.3 g/dL (ref 30.0–36.0)
MCV: 83.4 fL (ref 78.0–100.0)
Monocytes Absolute: 0.8 10*3/uL (ref 0.1–1.0)
Monocytes Relative: 5 % (ref 3–12)
Neutro Abs: 9.7 10*3/uL — ABNORMAL HIGH (ref 1.7–7.7)
Neutrophils Relative %: 65 % (ref 43–77)
Platelets: 410 10*3/uL — ABNORMAL HIGH (ref 150–400)
RBC: 4.65 MIL/uL (ref 3.87–5.11)
RDW: 14.9 % (ref 11.5–15.5)
WBC: 14.9 10*3/uL — ABNORMAL HIGH (ref 4.0–10.5)

## 2014-04-20 LAB — BASIC METABOLIC PANEL
Anion gap: 13 (ref 5–15)
BUN: 5 mg/dL — ABNORMAL LOW (ref 6–23)
CALCIUM: 9.2 mg/dL (ref 8.4–10.5)
CO2: 24 mEq/L (ref 19–32)
CREATININE: 0.7 mg/dL (ref 0.50–1.10)
Chloride: 104 mEq/L (ref 96–112)
GFR calc Af Amer: >90 mL/min (ref >90)
GLUCOSE: 95 mg/dL (ref 70–99)
Potassium: 3.4 mEq/L — ABNORMAL LOW (ref 3.7–5.3)
SODIUM: 141 meq/L (ref 137–147)

## 2014-04-20 MED ORDER — POLYETHYLENE GLYCOL 3350 17 G PO PACK: 17.0000 g | PACK | Freq: Every day | ORAL | Status: DC

## 2014-04-20 MED ORDER — HYDROCORTISONE 2.5 % RE CREA: TOPICAL_CREAM | RECTAL | Status: DC

## 2014-04-20 MED ORDER — OXYCODONE-ACETAMINOPHEN 5-325 MG PO TABS: 2.0000 | ORAL_TABLET | ORAL | Status: DC | PRN

## 2014-04-20 MED ORDER — IOHEXOL 300 MG/ML  SOLN
50.0000 mL | Freq: Once | INTRAMUSCULAR | Status: AC | PRN
  Administered 2014-04-20: 50 mL via ORAL

## 2014-04-20 MED ORDER — DOCUSATE SODIUM 250 MG PO CAPS: 250.0000 mg | ORAL_CAPSULE | Freq: Every day | ORAL | Status: DC

## 2014-04-20 MED ORDER — ONDANSETRON HCL 4 MG/2ML IJ SOLN
4.0000 mg | Freq: Once | INTRAMUSCULAR | Status: DC
  Filled 2014-04-20: qty 2

## 2014-04-20 MED ORDER — IOHEXOL 300 MG/ML  SOLN
100.0000 mL | Freq: Once | INTRAMUSCULAR | Status: AC | PRN
  Administered 2014-04-20: 100 mL via INTRAVENOUS

## 2014-04-20 NOTE — ED Notes (Signed)
CT tech at bedside to discuss CT with pt

## 2014-04-20 NOTE — Discharge Instructions (Signed)
Rectal Bleeding  Rectal bleeding is when blood passes out of the anus. It is usually a sign that something is wrong. It may not be serious, but it should always be evaluated. Rectal bleeding may present as bright red blood or extremely dark stools. The color may range from dark red or maroon to black (like tar). It is important that the cause of rectal bleeding be identified so treatment can be started and the problem corrected.  CAUSES    Hemorrhoids. These are enlarged (dilated) blood vessels or veins in the anal or rectal area.   Fistulas. Theseare abnormal, burrowing channels that usually run from inside the rectum to the skin around the anus. They can bleed.   Anal fissures. This is a tear in the tissue of the anus. Bleeding occurs with bowel movements.   Diverticulosis. This is a condition in which pockets or sacs project from the bowel wall. Occasionally, the sacs can bleed.   Diverticulitis. Thisis an infection involving diverticulosis of the colon.   Proctitis and colitis. These are conditions in which the rectum, colon, or both, can become inflamed and pitted (ulcerated).   Polyps and cancer. Polyps are non-cancerous (benign) growths in the colon that may bleed. Certain types of polyps turn into cancer.   Protrusion of the rectum. Part of the rectum can project from the anus and bleed.   Certain medicines.   Intestinal infections.   Blood vessel abnormalities.  HOME CARE INSTRUCTIONS   Eat a high-fiber diet to keep your stool soft.   Limit activity.   Drink enough fluids to keep your urine clear or pale yellow.   Warm baths may be useful to soothe rectal pain.   Follow up with your caregiver as directed.  SEEK IMMEDIATE MEDICAL CARE IF:   You develop increased bleeding.   You have black or dark red stools.   You vomit blood or material that looks like coffee grounds.   You have abdominal pain or tenderness.   You have a fever.   You feel weak, nauseous, or you faint.   You have  severe rectal pain or you are unable to have a bowel movement.  MAKE SURE YOU:   Understand these instructions.   Will watch your condition.   Will get help right away if you are not doing well or get worse.  Document Released: 10/14/2001 Document Revised: 07/17/2011 Document Reviewed: 10/09/2010  ExitCare Patient Information 2015 [employer]. This information is not intended to replace advice given to you by your health care provider. Make sure you discuss any questions you have with your health care provider.  Abdominal Pain  Many things can cause abdominal pain. Usually, abdominal pain is not caused by a disease and will improve without treatment. It can often be observed and treated at home. Your health care provider will do a physical exam and possibly order blood tests and X-rays to help determine the seriousness of your pain. However, in many cases, more time must pass before a clear cause of the pain can be found. Before that point, your health care provider may not know if you need more testing or further treatment.  HOME CARE INSTRUCTIONS   Monitor your abdominal pain for any changes. The following actions may help to alleviate any discomfort you are experiencing:   Only take over-the-counter or prescription medicines as directed by your health care provider.   Do not take laxatives unless directed to do so by your health care provider.     Try a clear liquid diet (broth, tea, or water) as directed by your health care provider. Slowly move to a bland diet as tolerated.  SEEK MEDICAL CARE IF:   You have unexplained abdominal pain.   You have abdominal pain associated with nausea or diarrhea.   You have pain when you urinate or have a bowel movement.   You experience abdominal pain that wakes you in the night.   You have abdominal pain that is worsened or improved by eating food.   You have abdominal pain that is worsened with eating fatty foods.   You have a fever.  SEEK IMMEDIATE MEDICAL  CARE IF:    Your pain does not go away within 2 hours.   You keep throwing up (vomiting).   Your pain is felt only in portions of the abdomen, such as the right side or the left lower portion of the abdomen.   You pass bloody or black tarry stools.  MAKE SURE YOU:   Understand these instructions.    Will watch your condition.    Will get help right away if you are not doing well or get worse.   Document Released: 02/01/2005 Document Revised: 04/29/2013 Document Reviewed: 01/01/2013  [employer] Patient Information 2015 [employer]. This information is not intended to replace advice given to you by your health care provider. Make sure you discuss any questions you have with your health care provider.

## 2014-04-20 NOTE — ED Notes (Signed)
Pt leaving room- states she is tired of waiting- EDPA Sofia at bedside to discuss options for d/c vs having CT scan tonight for further eval of abd pain and rectal bleeding- pt decided to stay for further eval

## 2014-04-20 NOTE — ED Notes (Signed)
Pt reports constipation all weekend and took some laxatives on Saturday and yesterday with minimal relief.  Pt reports everytime she wipes there is blood even if she doesn't have a bowel movement.  Pt also reports a deep pain described as pressure in here vagina.

## 2014-04-20 NOTE — ED Notes (Signed)
EDPA and RN into room for team d/c.

## 2014-04-20 NOTE — ED Provider Notes (Addendum)
CSN: [id number]     Arrival date & time 04/20/14  1755 History   First MD Initiated Contact with Patient 04/20/14 1812     Chief Complaint  Patient presents with  . Rectal Bleeding     (Consider location/radiation/quality/duration/timing/severity/associated sxs/prior Treatment) Patient is a 53 y.o. female presenting with hematochezia. The history is provided by the patient. No language interpreter was used.  Rectal Bleeding Amount:  Scant Duration:  1 day Timing:  Constant Progression:  Worsening Chronicity:  New Context: rectal pain   Pain details:    Quality:  Aching   Severity:  Moderate   Duration:  1 day   Timing:  Constant Similar prior episodes: no   Relieved by:  Nothing Worsened by:  Nothing tried Ineffective treatments:  None tried Associated symptoms: no abdominal pain   Pt complains of rectal bleeding today.   Pt reports small streak of blood.   Pt reports small streaks of blood but no bowel movement.   Pt took laxatives on Saturday and Sunday for constipation.  No relief.  Pt reports discomfort into vaginal area.  (Pt has had a hysterectomy)  Pt has not had 53 year old colonoscopy.  Pt has had a recto fistula repair.   Pt has MS,  Past Medical History  Diagnosis Date  . Multiple sclerosis   . Chronic fatigue   . Cervical dysplasia   . GERD (gastroesophageal reflux disease)   . Carpal tunnel syndrome   . Prolonged QT interval   . Hypertension   . Mitral valve prolapse    Past Surgical History  Procedure Laterality Date  . Rectovaginal fistula repair    . Abdominal hysterectomy    . Dilation and curettage of uterus    . Foot surgery Bilateral     For bunions  . Carpal tunnel release    . Bladder resuspension    . Hammer toe surgery Left   . Cervical spine surgery  10-16-2009  . Orif toe fracture Right   . Biceps tendon repair     Family History  Problem Relation Age of Onset  . Diabetes Mother   . Melanoma Mother   . Cancer Maternal Grandmother    . Cancer Maternal Grandfather    History  Substance Use Topics  . Smoking status: Current Every Day Smoker -- 1.00 packs/day    Types: Cigarettes  . Smokeless tobacco: Never Used  . Alcohol Use: No   OB History    No data available     Review of Systems  Gastrointestinal: Positive for hematochezia and rectal pain. Negative for abdominal pain.  All other systems reviewed and are negative.     Allergies  Demerol; Erythromycin; Morphine and related; Sulfa antibiotics; Diflucan; Other; Toviaz; and Tape  Home Medications   Prior to Admission medications   Medication Sig Start Date End Date Taking? Authorizing Provider  aspirin 81 MG tablet Take 81 mg by mouth daily.    Historical Provider, MD  clonazePAM (KLONOPIN) 0.25 MG disintegrating tablet Take 1 tablet (0.25 mg total) by mouth 2 (two) times daily as needed. 10/11/12   York Spanielharles K Willis, MD  cyclobenzaprine (FLEXERIL) 10 MG tablet Take 1 tablet (10 mg total) by mouth 2 (two) times daily as needed for muscle spasms. 03/15/14   Hope Orlene OchM Neese, NP  DULoxetine (CYMBALTA) 60 MG capsule Take 1 capsule (60 mg total) by mouth 2 (two) times daily. 07/03/13   York Spanielharles K Willis, MD  ENABLEX 7.5 MG 24 hr tablet  Take 7.5 mg by mouth daily. 09/17/12   Historical Provider, MD  furosemide (LASIX) 20 MG tablet Take 1 tablet (20 mg total) by mouth daily. ONE TABLET ONCE DAILY X 3 DAYS THEN AS NEEDED FOR SWELLING OR SHORTNESS OF BREATH 12/17/13   Lewayne Bunting, MD  gabapentin (NEURONTIN) 100 MG capsule Take 100 mg by mouth as needed.    Historical Provider, MD  gabapentin (NEURONTIN) 300 MG capsule Take 300 mg by mouth as needed.    Historical Provider, MD  ibuprofen (ADVIL,MOTRIN) 800 MG tablet Take 1 tablet (800 mg total) by mouth 3 (three) times daily. Patient taking differently: Take 800 mg by mouth as needed.  12/12/13   Shari A Upstill, PA-C  losartan (COZAAR) 100 MG tablet Take 1 tablet (100 mg total) by mouth daily. 12/17/13   Lewayne Bunting, MD   methylphenidate (RITALIN) 20 MG tablet Take 1 tablet (20 mg total) by mouth daily. At noon 04/16/14   York Spaniel, MD  modafinil (PROVIGIL) 200 MG tablet TAKE 1 TABLET EVERY DAY 04/19/13   York Spaniel, MD  NASONEX 50 MCG/ACT nasal spray Place 50 sprays into the nose daily. 09/16/12   Historical Provider, MD  NEXIUM 40 MG capsule Take 40 mg by mouth daily. 09/18/12   Historical Provider, MD  nystatin (MYCOSTATIN) 100000 UNIT/ML suspension Take 5 mLs (500,000 Units total) by mouth 4 (four) times daily. 12/12/13   Shari A Upstill, PA-C  oxyCODONE-acetaminophen (ROXICET) 5-325 MG per tablet Take 1 tablet by mouth every 4 (four) hours as needed for severe pain. 03/15/14   Hope Orlene Och, NP  potassium chloride SA (K-DUR,KLOR-CON) 20 MEQ tablet 2 tablets now and 2 tablets in 4 hours then one tablet every time you take furosemide 12/22/13   Lewayne Bunting, MD   BP 165/99 mmHg  Pulse 90  Temp(Src) 97.9 F (36.6 C) (Oral)  Resp 18  SpO2 95% Physical Exam  Constitutional: She is oriented to person, place, and time. She appears well-developed and well-nourished.  HENT:  Head: Normocephalic.  Right Ear: External ear normal.  Left Ear: External ear normal.  Mouth/Throat: Oropharynx is clear and moist.  Eyes: EOM are normal. Pupils are equal, round, and reactive to light.  Neck: Normal range of motion.  Cardiovascular: Normal rate.   Pulmonary/Chest: Effort normal.  Abdominal: Soft. She exhibits no distension.  Genitourinary:  Rectal scant bright red blood, no formed stool, no mass, . External  Small skin tags, pounched out area 6oclock may be from previous hemmorhoid  Musculoskeletal: Normal range of motion.  Neurological: She is alert and oriented to person, place, and time.  Skin: Skin is warm.  Psychiatric: She has a normal mood and affect.  Nursing note and vitals reviewed.   ED Course  Procedures (including critical care time) Labs Review Labs Reviewed  CBC WITH DIFFERENTIAL -  Abnormal; Notable for the following:    WBC 14.9 (*)    Platelets 410 (*)    Neutro Abs 9.7 (*)    Lymphs Abs 4.3 (*)    All other components within normal limits  BASIC METABOLIC PANEL - Abnormal; Notable for the following:    Potassium 3.4 (*)    BUN 5 (*)    All other components within normal limits    Imaging Review Ct Abdomen Pelvis W Contrast  04/20/2014   CLINICAL DATA:  Right lower quadrant and pelvic pain.  Back pain.  EXAM: CT ABDOMEN AND PELVIS WITH CONTRAST  TECHNIQUE:  Multidetector CT imaging of the abdomen and pelvis was performed using the standard protocol following bolus administration of intravenous contrast.  CONTRAST:  50mL OMNIPAQUE IOHEXOL 300 MG/ML SOLN, OMNIPAQUE IOHEXOL 300 MG/ML SOLN  COMPARISON:  None currently available  FINDINGS: BODY WALL: Unremarkable.  LOWER CHEST: Thinning and low-density of the left ventricular apex suggesting remote infarction.  Mild scarring or atelectasis in the lingula.  ABDOMEN/PELVIS:  Liver: No focal abnormality.  Biliary: No evidence of biliary obstruction or stone.  Pancreas: Unremarkable.  Spleen: Unremarkable.  Adrenals: Unremarkable.  Kidneys and ureters: No hydronephrosis or stone. 15 mm cyst in the interpolar right kidney (slightly elevated density on axial imaging is likely volume averaging).  Bladder: Low bladder floor consistent with pelvic floor laxity.  Reproductive: Hysterectomy.  Negative ovaries.  Bowel: No obstruction. Normal appendix.  Retroperitoneum: No mass or adenopathy.  Peritoneum: No ascites or pneumoperitoneum.  Vascular: Scattered atherosclerosis.  OSSEOUS: Lower lumbar facet degeneration with mild slip at L4-5. Small, benign and peripherally sclerotic lucency in the right femoral neck.  IMPRESSION: 1. No acute intra-abdominal findings, including appendicitis. 2. 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   Electronically Signed   By: Tiburcio Pea M.D.   On: 04/20/2014 23:37     EKG  Interpretation None      MDM Rn reports pt walking out.  I talked to pt, She reports increasing abdominal pain.  Tearful.   Pt offered Iv medications, Ct scan.  Pt agreed to stay.     Pt's ct reviewed.   I advised pt of possible heart disease.  Pt has seen Dr. Jens Som in the past.  Pt advised to see Dr. Jens Som for evaluation.   Pt advised to see Gi for evaluation.  Pt given rx for miralax, percocet, colace and nausol.   Final diagnoses:  Rectal bleeding  Abdominal pain        Elson Areas, PA-C 04/21/14 0047  Doug Sou, MD 04/21/14 0057  Lonia Skinner Kettering, PA-C 04/23/14 1478  Doug Sou, MD 04/23/14 [phone]

## 2014-04-21 NOTE — ED Notes (Signed)
Pt coming back from vending machines on other side of building, ambulatory with steady gait, family x2 with pt.

## 2014-04-21 NOTE — ED Notes (Signed)
Pt not in room.

## 2014-04-21 NOTE — ED Notes (Signed)
Into room with EDPA for team d/c. Family x2 present. Alert, NAD, calm, interactive, resps e/u, no dyspnea noted.

## 2014-04-22 ENCOUNTER — Encounter (HOSPITAL_BASED_OUTPATIENT_CLINIC_OR_DEPARTMENT_OTHER): Payer: Self-pay

## 2014-04-22 ENCOUNTER — Ambulatory Visit (INDEPENDENT_AMBULATORY_CARE_PROVIDER_SITE_OTHER): Payer: Medicare Other | Admitting: Cardiology

## 2014-04-22 ENCOUNTER — Inpatient Hospital Stay (HOSPITAL_BASED_OUTPATIENT_CLINIC_OR_DEPARTMENT_OTHER)
Admission: EM | Admit: 2014-04-22 | Discharge: 2014-04-23 | DRG: 759 | Disposition: A | Discharge status: Home / Self Care | Payer: Medicare Other | Attending: Internal Medicine | Admitting: Internal Medicine

## 2014-04-22 ENCOUNTER — Encounter: Payer: Self-pay | Admitting: Cardiology

## 2014-04-22 VITALS — BP 144/82 | HR 100 | Ht 62.0 in | Wt 170.1 lb

## 2014-04-22 DIAGNOSIS — R9431 Abnormal electrocardiogram [ECG] [EKG]: Secondary | ICD-10-CM

## 2014-04-22 DIAGNOSIS — Z720 Tobacco use: Secondary | ICD-10-CM

## 2014-04-22 DIAGNOSIS — I341 Nonrheumatic mitral (valve) prolapse: Secondary | ICD-10-CM | POA: Diagnosis present

## 2014-04-22 DIAGNOSIS — I4581 Long QT syndrome: Secondary | ICD-10-CM | POA: Diagnosis present

## 2014-04-22 DIAGNOSIS — N760 Acute vaginitis: Secondary | ICD-10-CM | POA: Diagnosis present

## 2014-04-22 DIAGNOSIS — G35C0 Secondary progressive multiple sclerosis, unspecified: Secondary | ICD-10-CM | POA: Diagnosis present

## 2014-04-22 DIAGNOSIS — N823 Fistula of vagina to large intestine: Secondary | ICD-10-CM | POA: Diagnosis present

## 2014-04-22 DIAGNOSIS — Z79899 Other long term (current) drug therapy: Secondary | ICD-10-CM

## 2014-04-22 DIAGNOSIS — F17210 Nicotine dependence, cigarettes, uncomplicated: Secondary | ICD-10-CM | POA: Diagnosis present

## 2014-04-22 DIAGNOSIS — N898 Other specified noninflammatory disorders of vagina: Secondary | ICD-10-CM

## 2014-04-22 DIAGNOSIS — R102 Pelvic and perineal pain unspecified side: Secondary | ICD-10-CM | POA: Diagnosis present

## 2014-04-22 DIAGNOSIS — Z7982 Long term (current) use of aspirin: Secondary | ICD-10-CM

## 2014-04-22 DIAGNOSIS — R609 Edema, unspecified: Secondary | ICD-10-CM

## 2014-04-22 DIAGNOSIS — K219 Gastro-esophageal reflux disease without esophagitis: Secondary | ICD-10-CM | POA: Diagnosis present

## 2014-04-22 DIAGNOSIS — G35 Multiple sclerosis: Secondary | ICD-10-CM | POA: Diagnosis present

## 2014-04-22 DIAGNOSIS — E876 Hypokalemia: Secondary | ICD-10-CM | POA: Diagnosis present

## 2014-04-22 DIAGNOSIS — F329 Major depressive disorder, single episode, unspecified: Secondary | ICD-10-CM | POA: Diagnosis present

## 2014-04-22 DIAGNOSIS — I10 Essential (primary) hypertension: Secondary | ICD-10-CM | POA: Diagnosis present

## 2014-04-22 DIAGNOSIS — R938 Abnormal findings on diagnostic imaging of other specified body structures: Secondary | ICD-10-CM

## 2014-04-22 DIAGNOSIS — R9389 Abnormal findings on diagnostic imaging of other specified body structures: Secondary | ICD-10-CM | POA: Insufficient documentation

## 2014-04-22 LAB — COMPREHENSIVE METABOLIC PANEL
ALBUMIN: 3.4 g/dL — AB (ref 3.5–5.2)
ALT: 28 U/L (ref 0–35)
AST: 23 U/L (ref 0–37)
Alkaline Phosphatase: 166 U/L — ABNORMAL HIGH (ref 39–117)
Anion gap: 12 (ref 5–15)
BUN: 4 mg/dL — ABNORMAL LOW (ref 6–23)
CALCIUM: 8.8 mg/dL (ref 8.4–10.5)
CO2: 26 mEq/L (ref 19–32)
Chloride: 103 mEq/L (ref 96–112)
Creatinine, Ser: 0.7 mg/dL (ref 0.50–1.10)
GFR calc Af Amer: >90 mL/min (ref >90)
Glucose, Bld: 70 mg/dL (ref 70–99)
Potassium: 3.5 mEq/L — ABNORMAL LOW (ref 3.7–5.3)
Sodium: 141 mEq/L (ref 137–147)
Total Bilirubin: 0.3 mg/dL (ref 0.3–1.2)
Total Protein: 7.2 g/dL (ref 6.0–8.3)

## 2014-04-22 LAB — CBC WITH DIFFERENTIAL/PLATELET
BASOS ABS: 0 10*3/uL (ref 0.0–0.1)
Basophils Relative: 0 % (ref 0–1)
EOS PCT: 0 % (ref 0–5)
Eosinophils Absolute: 0.1 10*3/uL (ref 0.0–0.7)
HCT: 37.3 % (ref 36.0–46.0)
Hemoglobin: 12.7 g/dL (ref 12.0–15.0)
Lymphocytes Relative: 18 % (ref 12–46)
Lymphs Abs: 2.8 10*3/uL (ref 0.7–4.0)
MCH: 28.5 pg (ref 26.0–34.0)
MCHC: 34 g/dL (ref 30.0–36.0)
MCV: 83.6 fL (ref 78.0–100.0)
MONO ABS: 0.9 10*3/uL (ref 0.1–1.0)
Monocytes Relative: 6 % (ref 3–12)
Neutro Abs: 11.7 10*3/uL — ABNORMAL HIGH (ref 1.7–7.7)
Neutrophils Relative %: 76 % (ref 43–77)
Platelets: 408 10*3/uL — ABNORMAL HIGH (ref 150–400)
RBC: 4.46 MIL/uL (ref 3.87–5.11)
RDW: 14.9 % (ref 11.5–15.5)
WBC: 15.5 10*3/uL — ABNORMAL HIGH (ref 4.0–10.5)

## 2014-04-22 MED ORDER — OXYCODONE-ACETAMINOPHEN 5-325 MG PO TABS
2.0000 | ORAL_TABLET | ORAL | Status: DC | PRN
Start: 2014-04-22 — End: 2014-04-23

## 2014-04-22 MED ORDER — GABAPENTIN 100 MG PO CAPS
100.0000 mg | ORAL_CAPSULE | ORAL | Status: DC | PRN
  Filled 2014-04-22: qty 1

## 2014-04-22 MED ORDER — ASPIRIN 81 MG PO CHEW
81.0000 mg | CHEWABLE_TABLET | Freq: Every day | ORAL | Status: DC
  Administered 2014-04-23: 81 mg via ORAL
  Filled 2014-04-22: qty 1

## 2014-04-22 MED ORDER — POTASSIUM CHLORIDE CRYS ER 20 MEQ PO TBCR
20.0000 meq | EXTENDED_RELEASE_TABLET | Freq: Once | ORAL | Status: AC
  Administered 2014-04-23: 20 meq via ORAL
  Filled 2014-04-22: qty 1

## 2014-04-22 MED ORDER — SODIUM CHLORIDE 0.9 % IV BOLUS (SEPSIS)
500.0000 mL | Freq: Once | INTRAVENOUS | Status: AC
  Administered 2014-04-22: 500 mL via INTRAVENOUS

## 2014-04-22 MED ORDER — DULOXETINE HCL 60 MG PO CPEP
60.0000 mg | ORAL_CAPSULE | Freq: Two times a day (BID) | ORAL | Status: DC
  Administered 2014-04-23: 60 mg via ORAL
  Filled 2014-04-22 (×3): qty 1

## 2014-04-22 MED ORDER — POLYETHYLENE GLYCOL 3350 17 G PO PACK
17.0000 g | PACK | Freq: Every day | ORAL | Status: DC | PRN
  Filled 2014-04-22: qty 1

## 2014-04-22 MED ORDER — METRONIDAZOLE 500 MG PO TABS: 500.0000 mg | ORAL_TABLET | Freq: Two times a day (BID) | ORAL | Status: DC

## 2014-04-22 MED ORDER — CIPROFLOXACIN HCL 500 MG PO TABS: 500.0000 mg | ORAL_TABLET | Freq: Two times a day (BID) | ORAL | Status: DC

## 2014-04-22 MED ORDER — CLONAZEPAM 0.125 MG PO TBDP: 0.2500 mg | ORAL_TABLET | Freq: Two times a day (BID) | ORAL | Status: DC | PRN

## 2014-04-22 MED ORDER — PANTOPRAZOLE SODIUM 40 MG PO TBEC
40.0000 mg | DELAYED_RELEASE_TABLET | Freq: Every day | ORAL | Status: DC
  Administered 2014-04-23: 40 mg via ORAL
  Filled 2014-04-22: qty 1

## 2014-04-22 MED ORDER — CYCLOBENZAPRINE HCL 10 MG PO TABS
10.0000 mg | ORAL_TABLET | Freq: Two times a day (BID) | ORAL | Status: DC | PRN
  Filled 2014-04-22: qty 1

## 2014-04-22 MED ORDER — IBUPROFEN 800 MG PO TABS
800.0000 mg | ORAL_TABLET | Freq: Three times a day (TID) | ORAL | Status: DC
  Administered 2014-04-23: 800 mg via ORAL
  Filled 2014-04-22 (×3): qty 1

## 2014-04-22 MED ORDER — SODIUM CHLORIDE 0.9 % IV SOLN
INTRAVENOUS | Status: DC
  Administered 2014-04-23: 01:00:00 via INTRAVENOUS

## 2014-04-22 MED ORDER — HEPARIN SODIUM (PORCINE) 5000 UNIT/ML IJ SOLN
5000.0000 [IU] | Freq: Three times a day (TID) | INTRAMUSCULAR | Status: DC
  Administered 2014-04-23 (×2): 5000 [IU] via SUBCUTANEOUS
  Filled 2014-04-22 (×3): qty 1

## 2014-04-22 MED ORDER — DOCUSATE SODIUM 50 MG PO CAPS
250.0000 mg | ORAL_CAPSULE | Freq: Every day | ORAL | Status: DC
  Administered 2014-04-23: 250 mg via ORAL
  Filled 2014-04-22: qty 1

## 2014-04-22 MED ORDER — FLUTICASONE PROPIONATE 50 MCG/ACT NA SUSP
1.0000 | Freq: Every day | NASAL | Status: DC
  Filled 2014-04-22: qty 16

## 2014-04-22 MED ORDER — LOSARTAN POTASSIUM 50 MG PO TABS
100.0000 mg | ORAL_TABLET | Freq: Every day | ORAL | Status: DC
  Administered 2014-04-23: 100 mg via ORAL
  Filled 2014-04-22: qty 2

## 2014-04-22 MED ORDER — CEFOXITIN SODIUM 1 G IV SOLR
1.0000 g | Freq: Two times a day (BID) | INTRAVENOUS | Status: DC
  Administered 2014-04-23 (×2): 1 g via INTRAVENOUS
  Filled 2014-04-22 (×2): qty 1

## 2014-04-22 MED ORDER — MODAFINIL 200 MG PO TABS: 200.0000 mg | ORAL_TABLET | Freq: Every day | ORAL | Status: DC

## 2014-04-22 MED ORDER — METOPROLOL SUCCINATE ER 25 MG PO TB24: 25.0000 mg | ORAL_TABLET | Freq: Every day | ORAL | Status: DC

## 2014-04-22 MED ORDER — PIPERACILLIN-TAZOBACTAM 3.375 G IVPB
3.3750 g | Freq: Once | INTRAVENOUS | Status: AC
  Administered 2014-04-22: 3.375 g via INTRAVENOUS
  Filled 2014-04-22: qty 50

## 2014-04-22 MED ORDER — ACETAMINOPHEN 325 MG PO TABS
650.0000 mg | ORAL_TABLET | Freq: Once | ORAL | Status: AC
  Administered 2014-04-22: 650 mg via ORAL
  Filled 2014-04-22: qty 2

## 2014-04-22 MED ORDER — DARIFENACIN HYDROBROMIDE ER 7.5 MG PO TB24
7.5000 mg | ORAL_TABLET | Freq: Every day | ORAL | Status: DC
  Administered 2014-04-23: 7.5 mg via ORAL
  Filled 2014-04-22: qty 1

## 2014-04-22 MED ORDER — METHYLPHENIDATE HCL 5 MG PO TABS
20.0000 mg | ORAL_TABLET | Freq: Every day | ORAL | Status: DC
  Administered 2014-04-23: 20 mg via ORAL
  Filled 2014-04-22: qty 4

## 2014-04-22 NOTE — Assessment & Plan Note (Signed)
No evidence on most recent echo. 

## 2014-04-22 NOTE — Assessment & Plan Note (Signed)
Continue present dose of Lasix. 

## 2014-04-22 NOTE — Assessment & Plan Note (Signed)
Patient had recent CAT scan of her abdomen that questioned a prior apical infarct. I will schedule a nuclear study to evaluate perfusion. His most likely represents apical thinning. She is not having chest pain.

## 2014-04-22 NOTE — ED Notes (Addendum)
Report given to [facility]  from [place].

## 2014-04-22 NOTE — Progress Notes (Signed)
Was called by EDP, at [facility] for admission to [facility].  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 Patient had been seen in the ED 2 days prior for rectal bleeding. CT scan done at that time did not show any abnormalities. Patient was examined by EDP and underwent a pelvic examination and noted to have discharge noted in the vaginal area which was felt to come from the posterior aspect. Rectal exam revealed no fluctuance in the perirectal area blood however showed increased discharge from the vaginal area. EDP spoke with GYN on call as well as general surgery. It was initially recommended that patient be discharged on oral antibiotics with outpatient follow-up, however just prior to discharge patient was noted to have a fever of 101.1. Patient also noted to have a leukocytosis. It was recommended that patient be admitted for IV antibiotics and further evaluation. On admission GYN consultation needs to be obtained for further evaluation as well as plus or minus GI versus general surgery. Patient accepted in a telemetry bed at [facility].

## 2014-04-22 NOTE — Progress Notes (Signed)
HPI: FU hypertension, prolonged QT and mitral valve prolapse. Previously followed in [facility]. Holter monitor in September of 2012 showed sinus rhythm with rare PVC. Echocardiogram in June of 2015 showed normal LV function and mild left atrial enlargement. There was trace mitral regurgitation. Patient had an abdominal CT for abdominal pain in December 2015. 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 Patient was told she had heart disease. Since she was last seen, she denies dyspnea, chest pain or syncope. She occasionally has mild edema. She is complaining of pain in her vagina.  Current Outpatient Prescriptions  Medication Sig Dispense Refill  . aspirin 81 MG tablet Take 81 mg by mouth daily.    . clonazePAM (KLONOPIN) 0.25 MG disintegrating tablet Take 1 tablet (0.25 mg total) by mouth 2 (two) times daily as needed. 60 tablet 1  . cyclobenzaprine (FLEXERIL) 10 MG tablet Take 1 tablet (10 mg total) by mouth 2 (two) times daily as needed for muscle spasms. 20 tablet 0  . docusate sodium (COLACE) 250 MG capsule Take 1 capsule (250 mg total) by mouth daily. 10 capsule 0  . DULoxetine (CYMBALTA) 60 MG capsule Take 1 capsule (60 mg total) by mouth 2 (two) times daily. (Patient taking differently: Take 60 mg by mouth daily. ) 180 capsule 3  . ENABLEX 7.5 MG 24 hr tablet Take 7.5 mg by mouth daily.    . furosemide (LASIX) 20 MG tablet Take 1 tablet (20 mg total) by mouth daily. ONE TABLET ONCE DAILY X 3 DAYS THEN AS NEEDED FOR SWELLING OR SHORTNESS OF BREATH 90 tablet 3  . gabapentin (NEURONTIN) 100 MG capsule Take 100 mg by mouth as needed.    . gabapentin (NEURONTIN) 300 MG capsule Take 300 mg by mouth as needed.    . hydrocortisone (ANUSOL-HC) 2.5 % rectal cream Apply rectally 2 times daily 28 g 1  . ibuprofen (ADVIL,MOTRIN) 800 MG tablet Take 1 tablet (800 mg total) by mouth 3 (three) times daily. (Patient taking differently: Take 800 mg by  mouth as needed. ) 21 tablet 0  . losartan (COZAAR) 100 MG tablet Take 1 tablet (100 mg total) by mouth daily. 90 tablet 4  . methylphenidate (RITALIN) 20 MG tablet Take 1 tablet (20 mg total) by mouth daily. At noon 30 tablet 0  . modafinil (PROVIGIL) 200 MG tablet TAKE 1 TABLET EVERY DAY (Patient taking differently: TAKE 1 TABLET EVERY DAY AS NEEDED) 30 tablet 5  . NASONEX 50 MCG/ACT nasal spray Place 50 sprays into the nose daily.    Marland Kitchen NEXIUM 40 MG capsule Take 40 mg by mouth daily.    Marland Kitchen nystatin (MYCOSTATIN) 100000 UNIT/ML suspension Take 5 mLs (500,000 Units total) by mouth 4 (four) times daily. (Patient taking differently: Take 500,000 Units by mouth 4 (four) times daily as needed. ) 60 mL 0  . oxyCODONE-acetaminophen (PERCOCET/ROXICET) 5-325 MG per tablet Take 2 tablets by mouth every 4 (four) hours as needed for severe pain. 20 tablet 0  . polyethylene glycol (MIRALAX) packet Take 17 g by mouth daily. 14 each 0  . potassium chloride SA (K-DUR,KLOR-CON) 20 MEQ tablet 2 tablets now and 2 tablets in 4 hours then one tablet every time you take furosemide 90 tablet 3   No current facility-administered medications for this visit.     Past Medical History  Diagnosis Date  . Multiple sclerosis   . Chronic fatigue   . Cervical dysplasia   .  GERD (gastroesophageal reflux disease)   . Carpal tunnel syndrome   . Prolonged QT interval   . Hypertension   . Mitral valve prolapse     Past Surgical History  Procedure Laterality Date  . Rectovaginal fistula repair    . Abdominal hysterectomy    . Dilation and curettage of uterus    . Foot surgery Bilateral     For bunions  . Carpal tunnel release    . Bladder resuspension    . Hammer toe surgery Left   . Cervical spine surgery  10-16-2009  . Orif toe fracture Right   . Biceps tendon repair      History   Social History  . Marital Status: Divorced    Spouse Name: N/A    Number of Children: 2  . Years of Education: 13    Occupational History  . Unemployed     Disability   Social History Main Topics  . Smoking status: Current Every Day Smoker -- 1.00 packs/day    Types: Cigarettes  . Smokeless tobacco: Never Used  . Alcohol Use: No  . Drug Use: No  . Sexual Activity: Not on file   Other Topics Concern  . Not on file   Social History Narrative    ROS: pain in her vagina but no fevers or chills, productive cough, hemoptysis, dysphasia, odynophagia, melena, hematochezia, dysuria, hematuria, rash, seizure activity, orthopnea, PND, pedal edema, claudication. Remaining systems are negative.  Physical Exam: Well-developed well-nourished in no acute distress.  Skin is warm and dry.  HEENT is normal.  Neck is supple.  Chest is clear to auscultation with normal expansion.  Cardiovascular exam is regular rate and rhythm.  Abdominal exam nontender or distended. No masses palpated. Extremities show no edema. neuro grossly intact  ECG sinus rhythm at a rate of 100. Normal axis. Nonspecific ST changes.

## 2014-04-22 NOTE — ED Notes (Signed)
Pt questions why she is being admitted when her fever is down. Dr Rosalia Hammers to talk with pt.Pt refuses to stay in room. Pt sitting in w/c outside of room with family. States room is too hot. Charge RN Diane aware.

## 2014-04-22 NOTE — Assessment & Plan Note (Signed)
No history of syncope. Avoid QT prolonging medications.

## 2014-04-22 NOTE — ED Notes (Signed)
POC was for DC but pt has now spiked a fever so edp is consulting again with OBGYN for possible admission.

## 2014-04-22 NOTE — ED Provider Notes (Signed)
CSN: [id number]     Arrival date & time 04/22/14  1439 History   First MD Initiated Contact with Patient 04/22/14 1508     No chief complaint on file.    (Consider location/radiation/quality/duration/timing/severity/associated sxs/prior Treatment) HPI 53 year old female presents today complaining of vaginal pain. She was seen here 2 days ago for rectal bleeding. At that time she had a CT scan which did not show any acute abnormalities. Out that this was due to hemorrhoids as she reports that hemorrhoids were present on her exam. She states that she has been on stool softener and has continued to have some constipation and pain in the rectal area but is chiefly complaining of vaginal pain and pressure. 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 She had this [place] in [place]. She reports she felt that she still had some seepage of stool material through that area after the repair but had not sought any further treatment. He is now concerned that this may be part of the problem. She denies any change in urinary habits although she does have frequency of urination. She states that she has had frequency of urination since being diagnosed with MS. Not noted any pain or changes in urinary habits. Denies any abdominal pain or pelvic pain. She has been eating and drinking as usual. She is not in any fever or chills. She has been using Percocet and external cream for hemorrhoids and feels has not helped.  She has had a hysterectomy.   Past Medical History  Diagnosis Date  . Multiple sclerosis   . Chronic fatigue   . Cervical dysplasia   . GERD (gastroesophageal reflux disease)   . Carpal tunnel syndrome   . Prolonged QT interval   . Hypertension   . Mitral valve prolapse    Past Surgical History  Procedure Laterality Date  . Rectovaginal fistula repair    . Abdominal hysterectomy    . Dilation and curettage of  uterus    . Foot surgery Bilateral     For bunions  . Carpal tunnel release    . Bladder resuspension    . Hammer toe surgery Left   . Cervical spine surgery  10-16-2009  . Orif toe fracture Right   . Biceps tendon repair     Family History  Problem Relation Age of Onset  . Diabetes Mother   . Melanoma Mother   . Cancer Maternal Grandmother   . Cancer Maternal Grandfather    History  Substance Use Topics  . Smoking status: Current Every Day Smoker -- 1.00 packs/day    Types: Cigarettes  . Smokeless tobacco: Never Used  . Alcohol Use: No   OB History    No data available     Review of Systems    Allergies  Demerol; Erythromycin; Morphine and related; Sulfa antibiotics; Diflucan; Other; Toviaz; and Tape  Home Medications   Prior to Admission medications   Medication Sig Start Date End Date Taking? Authorizing Provider  aspirin 81 MG tablet Take 81 mg by mouth daily.    Historical Provider, MD  clonazePAM (KLONOPIN) 0.25 MG disintegrating tablet Take 1 tablet (0.25 mg total) by mouth 2 (two) times daily as needed. 10/11/12   York Spanielharles K Willis, MD  cyclobenzaprine (FLEXERIL) 10 MG tablet Take 1 tablet (10 mg total) by mouth 2 (two) times daily as needed for muscle spasms. 03/15/14   Hope M  Damian Leavell, NP  docusate sodium (COLACE) 250 MG capsule Take 1 capsule (250 mg total) by mouth daily. 04/20/14   Elson Areas, PA-C  DULoxetine (CYMBALTA) 60 MG capsule Take 1 capsule (60 mg total) by mouth 2 (two) times daily. Patient taking differently: Take 60 mg by mouth daily.  07/03/13   York Spaniel, MD  ENABLEX 7.5 MG 24 hr tablet Take 7.5 mg by mouth daily. 09/17/12   Historical Provider, MD  furosemide (LASIX) 20 MG tablet Take 1 tablet (20 mg total) by mouth daily. ONE TABLET ONCE DAILY X 3 DAYS THEN AS NEEDED FOR SWELLING OR SHORTNESS OF BREATH 12/17/13   Lewayne Bunting, MD  gabapentin (NEURONTIN) 100 MG capsule Take 100 mg by mouth as needed.    Historical Provider, MD    gabapentin (NEURONTIN) 300 MG capsule Take 300 mg by mouth as needed.    Historical Provider, MD  hydrocortisone (ANUSOL-HC) 2.5 % rectal cream Apply rectally 2 times daily 04/20/14   Elson Areas, PA-C  ibuprofen (ADVIL,MOTRIN) 800 MG tablet Take 1 tablet (800 mg total) by mouth 3 (three) times daily. Patient taking differently: Take 800 mg by mouth as needed.  12/12/13   Shari A Upstill, PA-C  losartan (COZAAR) 100 MG tablet Take 1 tablet (100 mg total) by mouth daily. 12/17/13   Lewayne Bunting, MD  methylphenidate (RITALIN) 20 MG tablet Take 1 tablet (20 mg total) by mouth daily. At noon 04/16/14   York Spaniel, MD  metoprolol succinate (TOPROL XL) 25 MG 24 hr tablet Take 1 tablet (25 mg total) by mouth daily. 04/22/14   Lewayne Bunting, MD  modafinil (PROVIGIL) 200 MG tablet TAKE 1 TABLET EVERY DAY Patient taking differently: TAKE 1 TABLET EVERY DAY AS NEEDED 04/19/13   York Spaniel, MD  NASONEX 50 MCG/ACT nasal spray Place 50 sprays into the nose daily. 09/16/12   Historical Provider, MD  NEXIUM 40 MG capsule Take 40 mg by mouth daily. 09/18/12   Historical Provider, MD  nystatin (MYCOSTATIN) 100000 UNIT/ML suspension Take 5 mLs (500,000 Units total) by mouth 4 (four) times daily. Patient taking differently: Take 500,000 Units by mouth 4 (four) times daily as needed.  12/12/13   Shari A Upstill, PA-C  oxyCODONE-acetaminophen (PERCOCET/ROXICET) 5-325 MG per tablet Take 2 tablets by mouth every 4 (four) hours as needed for severe pain. 04/20/14   Elson Areas, PA-C  polyethylene glycol [facility]) packet Take 17 g by mouth daily. 04/20/14   Elson Areas, PA-C  potassium chloride SA (K-DUR,KLOR-CON) 20 MEQ tablet 2 tablets now and 2 tablets in 4 hours then one tablet every time you take furosemide 12/22/13   Lewayne Bunting, MD   BP 146/79 mmHg  Pulse 105  Temp(Src) 98.1 F (36.7 C) (Oral)  Resp 20  SpO2 97% Physical Exam  Constitutional: She is oriented to person, place, and  time. She appears well-developed and well-nourished.  HENT:  Head: Normocephalic and atraumatic.  Right Ear: External ear normal.  Left Ear: External ear normal.  Nose: Nose normal.  Mouth/Throat: Oropharynx is clear and moist.  Eyes: Conjunctivae and EOM are normal. Pupils are equal, round, and reactive to light.  Neck: Normal range of motion. Neck supple. No JVD present. No tracheal deviation present. No thyromegaly present.  Cardiovascular: Normal rate, regular rhythm, normal heart sounds and intact distal pulses.   Pulmonary/Chest: Effort normal and breath sounds normal. She has no wheezes.  Abdominal: Soft. Bowel sounds are  normal. She exhibits no mass. There is no tenderness. There is no guarding.  Genitourinary:    Vaginal discharge found.  Discharge noted from vaginal area. After swabbing the area to clean it was apparent that there was discharge coming from the posterior aspect of the vaginal area. Rectal exam revealed no fluctuance in the perirectal area or blood, however it did increase to discharge in the vaginal area. This discharge was whitish and appeared purulent  Musculoskeletal: Normal range of motion.  Lymphadenopathy:    She has no cervical adenopathy.  Neurological: She is alert and oriented to person, place, and time. She has normal reflexes. No cranial nerve deficit or sensory deficit. Gait normal. GCS eye subscore is 4. GCS verbal subscore is 5. GCS motor subscore is 6.  Reflex Scores:      Bicep reflexes are 2+ on the right side and 2+ on the left side.      Patellar reflexes are 2+ on the right side and 2+ on the left side. Strength is normal and equal throughout. Cranial nerves grossly intact. Patient fluent. No gross ataxia and patient able to ambulate without difficulty.  Skin: Skin is warm and dry.  Psychiatric: She has a normal mood and affect. Her behavior is normal. Judgment and thought content normal.  Nursing note and vitals reviewed.   ED Course   Procedures (including critical care time) Labs Review Labs Reviewed  CULTURE, BLOOD (ROUTINE X 2)  CULTURE, BLOOD (ROUTINE X 2)  CBC WITH DIFFERENTIAL  COMPREHENSIVE METABOLIC PANEL    Imaging Review Ct Abdomen Pelvis W Contrast  04/20/2014   CLINICAL DATA:  Right lower quadrant and pelvic pain.  Back pain.  EXAM: CT ABDOMEN AND PELVIS WITH CONTRAST  TECHNIQUE: Multidetector CT imaging of the abdomen and pelvis was performed using the standard protocol following bolus administration of intravenous contrast.  CONTRAST:  50mL OMNIPAQUE IOHEXOL 300 MG/ML SOLN, OMNIPAQUE IOHEXOL 300 MG/ML SOLN  COMPARISON:  None currently available  FINDINGS: BODY WALL: Unremarkable.  LOWER CHEST: Thinning and low-density of the left ventricular apex suggesting remote infarction.  Mild scarring or atelectasis in the lingula.  ABDOMEN/PELVIS:  Liver: No focal abnormality.  Biliary: No evidence of biliary obstruction or stone.  Pancreas: Unremarkable.  Spleen: Unremarkable.  Adrenals: Unremarkable.  Kidneys and ureters: No hydronephrosis or stone. 15 mm cyst in the interpolar right kidney (slightly elevated density on axial imaging is likely volume averaging).  Bladder: Low bladder floor consistent with pelvic floor laxity.  Reproductive: Hysterectomy.  Negative ovaries.  Bowel: No obstruction. Normal appendix.  Retroperitoneum: No mass or adenopathy.  Peritoneum: No ascites or pneumoperitoneum.  Vascular: Scattered atherosclerosis.  OSSEOUS: Lower lumbar facet degeneration with mild slip at L4-5. Small, benign and peripherally sclerotic lucency in the right femoral neck.  IMPRESSION: 1. No acute intra-abdominal findings, including appendicitis. 2. 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   Electronically Signed   By: Tiburcio Pea M.D.   On: 04/20/2014 23:37     EKG Interpretation None      MDM   Final diagnoses:  Purulent vaginal discharge    53 year old female with drainage of purulent  material into the posterior vagina. She has a history of rectovaginal fistula and this certainly could be a return of the rectovaginal fistula or complication. The drainage appears purulent and I cannot rule out that there has been an abscess in this area, however review of her CT from 2 days ago did not reveal an obvious abscess.  Regardless, the patient has been draining here and feels improved. I have discussed her care with her gynecologist then subsequently with gynecology at [facility] as the patient requested. I've also discussed her care with general surgery. Dr. Suzzanne Cloud advises that she be started on Cipro and Flagyl. She is given Zosyn here in the emergency department. Dr. Adrian Blackwater will arrange for her to have follow-up at [facility] gynecology clinic I have discussed the plan with the patient and she wishes understanding. I discussed return precautions and she and her family voice understanding of this. She'll be placed on Cipro and Flagyl for 14 days. Dr. Adrian Blackwater plans on seeing her in follow-up in 1-2 weeks.  She also has follow-up appointment planned at [facility] GI from referral after her discharge here 2 days ago. Likely need to be evaluated by both GYN and GI.  Planned to discharge patient however patient now has fever to 101.1. I again discussed the patient with GYN and Dr. Modena Morrow is on call. Plan is to send the patient for IV antibiotics and further evaluation to the hospitalist service. The patient has a history of MS which flares with fevers. I then discussed the patient's care with Dr. Janee Morn on call for the hospitalist and the patient will be admitted to [facility].  Hilario Quarry, MD 04/22/14 [phone]

## 2014-04-22 NOTE — H&P (Signed)
[facility] History and Physical  Royce R Hunsberger ZOX:[id number] DOB: 1961-04-09 DOA: 04/22/2014  Referring physician: ED physician PCP: Doreatha MartinVELAZQUEZ,GRETCHEN, MD  Specialists:   Chief Complaint: Vaginal pain and discharge  HPI: Summer Henderson is a 53 y.o. female with past medical history of multiple sclerosis, GERD, prolonged QT interval, hypertension, MVP, hx of rectovaginal fistula repair, who presents with vaginal pain.  Patient had "rectal bleeding" and was seen 2 days ago.  At that time she had a CT scan which did not show any acute abnormalities. She was diagnosed with hemorrhoid and was given prescription of cream and stool softer by Ed. She continues to have vaginal pain. She also noticed vaginal bleeding and discharge. She has not been sexually active since year 2006. Ed did pelvic exam which showed whitish and purulent discharge coming from the posterior aspect of the vaginal area. Rectal exam revealed no fluctuance in the perirectal area. When I evaluated patient on the floor, her vaginal pain has almost resolved, but still has discharge. She thinks her "rectal bleeding" is most likely from vagina. She denies fever, chills, fatigue, headaches, cough, chest pain, SOB, abdominal pain, diarrhea, dysuria, urgency, frequency, hematuria, skin rashes, joint pain or leg swelling.  Work up in the ED demonstrates leukocytosis with WBC 15.5. CT abdomen/pelvis on 04/20/14 was negative for acute seminoma at this. Urinalysis is negative. Potassium 3.5. Patient is admitted to inpatient for further evaluation and treatment. OB/GYN is 32 was consulted by ED.  Review of Systems: As presented in the history of presenting illness, rest negative.  Where does patient live?  At home Can patient participate in ADLs? Yes  Allergy:  Allergies  Allergen Reactions  . Demerol [Meperidine]     Other reaction(s): Other (See Comments) [derm] rash, swelling, itching  . Erythromycin   . Morphine And Related    . Sulfa Antibiotics   . Diflucan [Fluconazole]   . Other     Other reaction(s): Other (See Comments) Z-pak causes EKG changes, was told by cardiologist not to take this.  Gala Murdoch. Toviaz [Fesoterodine Fumarate Er]   . Tape     Adhesive tape    Past Medical History  Diagnosis Date  . Multiple sclerosis   . Chronic fatigue   . Cervical dysplasia   . GERD (gastroesophageal reflux disease)   . Carpal tunnel syndrome   . Prolonged QT interval   . Hypertension   . Mitral valve prolapse     Past Surgical History  Procedure Laterality Date  . Rectovaginal fistula repair    . Abdominal hysterectomy    . Dilation and curettage of uterus    . Foot surgery Bilateral     For bunions  . Carpal tunnel release    . Bladder resuspension    . Hammer toe surgery Left   . Cervical spine surgery  10-16-2009  . Orif toe fracture Right   . Biceps tendon repair      Social History:  reports that she has been smoking Cigarettes.  She has been smoking about 1.00 pack per day. She has never used smokeless tobacco. She reports that she does not drink alcohol or use illicit drugs.  Family History:  Family History  Problem Relation Age of Onset  . Diabetes Mother   . Melanoma Mother   . Cancer Maternal Grandmother   . Cancer Maternal Grandfather      Prior to Admission medications   Medication Sig Start Date End Date Taking? Authorizing Provider  aspirin 81  MG tablet Take 81 mg by mouth daily.    Historical Provider, MD  ciprofloxacin (CIPRO) 500 MG tablet Take 1 tablet (500 mg total) by mouth every 12 (twelve) hours. 04/22/14   Hilario Quarry, MD  clonazePAM (KLONOPIN) 0.25 MG disintegrating tablet Take 1 tablet (0.25 mg total) by mouth 2 (two) times daily as needed. 10/11/12   York Spaniel, MD  cyclobenzaprine (FLEXERIL) 10 MG tablet Take 1 tablet (10 mg total) by mouth 2 (two) times daily as needed for muscle spasms. 03/15/14   Hope Orlene Och, NP  docusate sodium (COLACE) 250 MG capsule Take 1  capsule (250 mg total) by mouth daily. 04/20/14   Elson Areas, PA-C  DULoxetine (CYMBALTA) 60 MG capsule Take 1 capsule (60 mg total) by mouth 2 (two) times daily. Patient taking differently: Take 60 mg by mouth daily.  07/03/13   York Spaniel, MD  ENABLEX 7.5 MG 24 hr tablet Take 7.5 mg by mouth daily. 09/17/12   Historical Provider, MD  furosemide (LASIX) 20 MG tablet Take 1 tablet (20 mg total) by mouth daily. ONE TABLET ONCE DAILY X 3 DAYS THEN AS NEEDED FOR SWELLING OR SHORTNESS OF BREATH 12/17/13   Lewayne Bunting, MD  gabapentin (NEURONTIN) 100 MG capsule Take 100 mg by mouth as needed.    Historical Provider, MD  gabapentin (NEURONTIN) 300 MG capsule Take 300 mg by mouth as needed.    Historical Provider, MD  hydrocortisone (ANUSOL-HC) 2.5 % rectal cream Apply rectally 2 times daily 04/20/14   Elson Areas, PA-C  ibuprofen (ADVIL,MOTRIN) 800 MG tablet Take 1 tablet (800 mg total) by mouth 3 (three) times daily. Patient taking differently: Take 800 mg by mouth as needed.  12/12/13   Shari A Upstill, PA-C  losartan (COZAAR) 100 MG tablet Take 1 tablet (100 mg total) by mouth daily. 12/17/13   Lewayne Bunting, MD  methylphenidate (RITALIN) 20 MG tablet Take 1 tablet (20 mg total) by mouth daily. At noon 04/16/14   York Spaniel, MD  metoprolol succinate (TOPROL XL) 25 MG 24 hr tablet Take 1 tablet (25 mg total) by mouth daily. 04/22/14   Lewayne Bunting, MD  metroNIDAZOLE (FLAGYL) 500 MG tablet Take 1 tablet (500 mg total) by mouth 2 (two) times daily. 04/22/14   Hilario Quarry, MD  modafinil (PROVIGIL) 200 MG tablet TAKE 1 TABLET EVERY DAY Patient taking differently: TAKE 1 TABLET EVERY DAY AS NEEDED 04/19/13   York Spaniel, MD  NASONEX 50 MCG/ACT nasal spray Place 50 sprays into the nose daily. 09/16/12   Historical Provider, MD  NEXIUM 40 MG capsule Take 40 mg by mouth daily. 09/18/12   Historical Provider, MD  nystatin (MYCOSTATIN) 100000 UNIT/ML suspension Take 5 mLs (500,000  Units total) by mouth 4 (four) times daily. Patient taking differently: Take 500,000 Units by mouth 4 (four) times daily as needed.  12/12/13   Shari A Upstill, PA-C  oxyCODONE-acetaminophen (PERCOCET/ROXICET) 5-325 MG per tablet Take 2 tablets by mouth every 4 (four) hours as needed for severe pain. 04/20/14   Elson Areas, PA-C  polyethylene glycol [facility]) packet Take 17 g by mouth daily. 04/20/14   Elson Areas, PA-C  potassium chloride SA (K-DUR,KLOR-CON) 20 MEQ tablet 2 tablets now and 2 tablets in 4 hours then one tablet every time you take furosemide 12/22/13   Lewayne Bunting, MD    Physical Exam: Filed Vitals:   04/22/14 1441 04/22/14 1715 04/22/14  1920 04/22/14 2041  BP: 146/79 127/65 129/70 132/83  Pulse: 105 91 90 79  Temp: 98.1 F (36.7 C) 101.1 F (38.4 C) 98.2 F (36.8 C) 98.3 F (36.8 C)  TempSrc: Oral Oral Oral Oral  Resp: 20 12 20 20   Height:    5\' 2"  (1.575 m)  Weight:    76.658 kg (169 lb)  SpO2: 97% 95% 95% 97%   General: Not in acute distress HEENT:       Eyes: PERRL, EOMI, no scleral icterus       ENT: No discharge from the ears and nose, no pharynx injection, no tonsillar enlargement.        Neck: No JVD, no bruit, no mass felt. Cardiac: S1/S2, RRR, No murmurs, No gallops or rubs Pulm: Good air movement bilaterally. Clear to auscultation bilaterally. No rales, wheezing, rhonchi or rubs. Abd: Soft, nondistended, nontender, no rebound pain, no organomegaly, BS present Ext: No edema bilaterally. 2+DP/PT pulse bilaterally Musculoskeletal: No joint deformities, erythema, or stiffness, ROM full Skin: No rashes.  Neuro: Alert and oriented X3, cranial nerves II-XII grossly intact, muscle strength 5/5 in all extremeties, sensation to light touch intact.  Psych: Patient is not psychotic, no suicidal or hemocidal ideation.  Labs on Admission:  Basic Metabolic Panel:  Recent Labs Lab 04/16/14 1105 04/20/14 1848 04/22/14 1545  NA 142 141 141  K 4.3 3.4*  3.5*  CL 105 104 103  CO2 24 24 26   GLUCOSE 85 95 70  BUN 5* 5* 4*  CREATININE 0.83 0.70 0.70  CALCIUM 9.5 9.2 8.8   Liver Function Tests:  Recent Labs Lab 04/16/14 1105 04/22/14 1545  AST 28 23  ALT 32 28  ALKPHOS 184* 166*  BILITOT 0.3 0.3  PROT 7.0 7.2  ALBUMIN  --  3.4*   No results for input(s): LIPASE, AMYLASE in the last 168 hours. No results for input(s): AMMONIA in the last 168 hours. CBC:  Recent Labs Lab 04/16/14 1105 04/20/14 1848 04/22/14 1545  WBC 9.6 14.9* 15.5*  NEUTROABS 5.7 9.7* 11.7*  HGB 13.9 13.3 12.7  HCT 41.2 38.8 37.3  MCV 84 83.4 83.6  PLT 427* 410* 408*   Cardiac Enzymes: No results for input(s): CKTOTAL, CKMB, CKMBINDEX, TROPONINI in the last 168 hours.  BNP (last 3 results) No results for input(s): PROBNP in the last 8760 hours. CBG: No results for input(s): GLUCAP in the last 168 hours.  Radiological Exams on Admission: Ct Abdomen Pelvis W Contrast  04/20/2014   CLINICAL DATA:  Right lower quadrant and pelvic pain.  Back pain.  EXAM: CT ABDOMEN AND PELVIS WITH CONTRAST  TECHNIQUE: Multidetector CT imaging of the abdomen and pelvis was performed using the standard protocol following bolus administration of intravenous contrast.  CONTRAST:  88mL OMNIPAQUE IOHEXOL 300 MG/ML SOLN, OMNIPAQUE IOHEXOL 300 MG/ML SOLN  COMPARISON:  None currently available  FINDINGS: BODY WALL: Unremarkable.  LOWER CHEST: Thinning and low-density of the left ventricular apex suggesting remote infarction.  Mild scarring or atelectasis in the lingula.  ABDOMEN/PELVIS:  Liver: No focal abnormality.  Biliary: No evidence of biliary obstruction or stone.  Pancreas: Unremarkable.  Spleen: Unremarkable.  Adrenals: Unremarkable.  Kidneys and ureters: No hydronephrosis or stone. 15 mm cyst in the interpolar right kidney (slightly elevated density on axial imaging is likely volume averaging).  Bladder: Low bladder floor consistent with pelvic floor laxity.   Reproductive: Hysterectomy.  Negative ovaries.  Bowel: No obstruction. Normal appendix.  Retroperitoneum: No mass or adenopathy.  Peritoneum: No ascites or pneumoperitoneum.  Vascular: Scattered atherosclerosis.  OSSEOUS: Lower lumbar facet degeneration with mild slip at L4-5. Small, benign and peripherally sclerotic lucency in the right femoral neck.  IMPRESSION: 1. No acute intra-abdominal findings, including appendicitis. 2. 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   Electronically Signed   By: Tiburcio Pea M.D.   On: 04/20/2014 23:37    Assessment/Plan Principal Problem:   Vaginal pain Active Problems:   Multiple sclerosis   Mitral valve prolapse   Essential hypertension   Prolonged Q-T interval on ECG   Tobacco abuse   Rectovaginal fistula   Hypokalemia  Vaginal pain and discharge: Likely due to bacterial infection given leukocytosis and purulent discharge. CT-abd/pelvis was negative for acute abnormalities. Patient is not septic. -will admit to med-surg bed -start cefoxitin -IV fluid: Normal saline and cc per hour -Pain control: Ibuprofen and Tylenol -Check GC chlamydia prob -follow up Ob/Gyn recommendation  Hypokalemia: Potassium 3.5 -Repleted  -Multiple sclerosis: Stable. -Continue Flexeril, Neurontin  GERD: -Protonix  Hypertension: -Hold the Lasix -Cozaar -Continue metoprolol  Depression: Stable. No suicidal homicidal ideations. -Continue home medications.  DVT ppx: SQ Heparin    Code Status: Full code Family Communication:   Yes, patient's    son   at bed side Disposition Plan: Admit to inpatient   Date of Service 04/22/2014    Lorretta Harp [facility] Pager [phone]  If 7PM-7AM, please contact night-coverage [email or web address] Password [facility] 04/22/2014, 9:23 PM

## 2014-04-22 NOTE — Assessment & Plan Note (Signed)
Blood pressure elevated. Add Toprol 25 mg daily. 

## 2014-04-22 NOTE — Patient Instructions (Addendum)
Your physician wants you to follow-up in: 6 MONTHS WITH DR Jens Som You will receive a reminder letter in the mail two months in advance. If you don't receive a letter, please call our office to schedule the follow-up appointment.  Your physician has requested that you have en exercise stress myoview. For further information please visit [email or web address] Please follow instruction sheet, as given.  START METOPROLOL SUCC ER 25 MG ONCE DAILY AT BEDTIME

## 2014-04-22 NOTE — ED Notes (Signed)
[facility] here to transfer pt to [facility] 

## 2014-04-22 NOTE — ED Notes (Signed)
Vaginal pain x 5-6 days-was seen here this week for rectal bleeding

## 2014-04-22 NOTE — ED Notes (Signed)
Report called to [employer] RN on 4 th floor [facility].

## 2014-04-22 NOTE — ED Notes (Signed)
Pt went to [employer] and said she was going to vending machine. Pt has been told repeatedly that she is NPO and verbilizes understanding.

## 2014-04-22 NOTE — Assessment & Plan Note (Signed)
Patient counseled on discontinuing. 

## 2014-04-23 ENCOUNTER — Encounter: Payer: Self-pay | Admitting: Obstetrics & Gynecology

## 2014-04-23 DIAGNOSIS — Z720 Tobacco use: Secondary | ICD-10-CM

## 2014-04-23 LAB — CBC
HCT: 36.7 % (ref 36.0–46.0)
HEMOGLOBIN: 12 g/dL (ref 12.0–15.0)
MCH: 28.1 pg (ref 26.0–34.0)
MCHC: 32.7 g/dL (ref 30.0–36.0)
MCV: 85.9 fL (ref 78.0–100.0)
Platelets: 400 10*3/uL (ref 150–400)
RBC: 4.27 MIL/uL (ref 3.87–5.11)
RDW: 15.2 % (ref 11.5–15.5)
WBC: 10.1 10*3/uL (ref 4.0–10.5)

## 2014-04-23 LAB — BASIC METABOLIC PANEL
Anion gap: 12 (ref 5–15)
BUN: 6 mg/dL (ref 6–23)
CO2: 24 meq/L (ref 19–32)
Calcium: 8.6 mg/dL (ref 8.4–10.5)
Chloride: 104 mEq/L (ref 96–112)
Creatinine, Ser: 0.82 mg/dL (ref 0.50–1.10)
GFR calc non Af Amer: 81 mL/min — ABNORMAL LOW (ref >90)
Glucose, Bld: 114 mg/dL — ABNORMAL HIGH (ref 70–99)
Potassium: 3.4 mEq/L — ABNORMAL LOW (ref 3.7–5.3)
Sodium: 140 mEq/L (ref 137–147)

## 2014-04-23 LAB — PROTIME-INR
INR: 1.01 (ref 0.00–1.49)
PROTHROMBIN TIME: 13.4 s (ref 11.6–15.2)

## 2014-04-23 LAB — HIV ANTIBODY (ROUTINE TESTING W REFLEX): HIV 1&2 Ab, 4th Generation: NONREACTIVE

## 2014-04-23 MED ORDER — POTASSIUM CHLORIDE CRYS ER 20 MEQ PO TBCR
40.0000 meq | EXTENDED_RELEASE_TABLET | ORAL | Status: DC
  Administered 2014-04-23: 40 meq via ORAL
  Filled 2014-04-23: qty 2

## 2014-04-23 MED ORDER — CIPROFLOXACIN HCL 500 MG PO TABS: 500.0000 mg | ORAL_TABLET | Freq: Two times a day (BID) | ORAL | Status: DC

## 2014-04-23 MED ORDER — METRONIDAZOLE 500 MG PO TABS: 500.0000 mg | ORAL_TABLET | Freq: Two times a day (BID) | ORAL | Status: DC

## 2014-04-23 NOTE — Discharge Summary (Signed)
Physician Discharge Summary  Summer Henderson ZOX:[id number] DOB: [birth year] DOA: 04/22/2014  PCP: Doreatha Martin, MD  Admit date: 04/22/2014 Discharge date: 04/23/2014  Time spent: < 30 minutes  Recommendations for Outpatient Follow-up:  Check CBC to assess Hgb trend Recheck BMET to follow electrolytes Reassess BP and adjust medication regimen as needed  Discharge Diagnoses:  Principal Problem:   Vaginal pain Active Problems:   Multiple sclerosis   Mitral valve prolapse   Essential hypertension   Prolonged Q-T interval on ECG   Tobacco abuse   Rectovaginal fistula   Hypokalemia   Discharge Condition: stable and improved. Will discharge home. Appointment with PCP in 10 days; follow up at discharge with OB/GYN for proper pelvic examination and further recommendations.  Diet recommendation: heart healthy diet  Filed Weights   04/22/14 2041  Weight: 76.658 kg (169 lb)    History of present illness:  Summer Henderson is a 53 y.o. female with past medical history of multiple sclerosis, GERD, prolonged QT interval, hypertension, MVP, hx of rectovaginal fistula repair, who presents with vaginal pain. Patient had " presumed rectal bleeding" and was seen 2 days ago. At that time she had a CT scan which did not show any acute abnormalities. She was diagnosed with hemorrhoid and was given prescription of anusol cream and stool softer by EDP. She continues to have vaginal pain and return to ED on 12/16. She also noticed vaginal bleeding and some discharge. She has not been sexually active since year 2006. EDP did pelvic exam which showed whitish and bloody discharge coming from the posterior aspect of the vaginal area. Patient Rectal exam revealed no fluctuance in the perirectal area. Patient developed fever and was found to have elevated WBC's. She was transferred to [facility] for further evaluation and treatment.   Hospital Course:  Vaginal pain and discharge: Likely due to bacterial infection  given leukocytosis and whitish/bloody discharge. CT-abd/pelvis was negative for acute abnormalities. Patient is not septic/toxic on exam. -will discharge on treatment with cipro and flagyl for 14 days -no fever and no WBC's at discharge -Hgb stable and patient hemodynamically intact -appointment with OB/GYN at 3:00pm on 04/23/14 arranged -advise to stop advil or any other NSAID's given bloody discharge  -Follow up Ob/Gyn recommendation  Hypokalemia:  -Repleted -will also resume home supplementation regimen  -Multiple sclerosis: Stable. -Continue Flexeril and Neurontin  GERD: continue PPI  Hypertension: -continue Cozaar and metoprolol -advise to follow a heart healthy diet  Depression: Stable. No suicidal homicidal ideations. -Continue home medications.  Procedures:  See below for x-ray reports   Consultations:  GYN (requested; but patient never seen)  Discharge Exam: Filed Vitals:   04/23/14 0824  BP: 125/73  Pulse: 77  Temp:   Resp: 20    General: NAD, afebrile, reports improvement in her vaginal pain; still with some bloody discharge Cardiovascular: S1 and S2, no rubs or gallops Respiratory: CTA bilaterally Abd: soft, NT, ND, positive BS  Discharge Instructions You were cared for by a hospitalist during your hospital stay. If you have any questions about your discharge medications or the care you received while you were in the hospital after you are discharged, you can call the unit and asked to speak with the hospitalist on call if the hospitalist that took care of you is not available. Once you are discharged, your primary care physician will handle any further medical issues. Please note that NO REFILLS for any discharge medications will be authorized once you are discharged, as it is  imperative that you return to your primary care physician (or establish a relationship with a primary care physician if you do not have one) for your aftercare needs so that they can  reassess your need for medications and monitor your lab values.  Discharge Instructions    Discharge instructions    Complete by:  As directed   Keep yourself well hydrated Follow with OB/GYN appointment at 3:00pm Follow up with gastroenterology service as arrange for screening colonoscopy Take medications as prescribed          Current Discharge Medication List    START taking these medications   Details  ciprofloxacin (CIPRO) 500 MG tablet Take 1 tablet (500 mg total) by mouth every 12 (twelve) hours. Qty: 28 tablet, Refills: 0    metroNIDAZOLE (FLAGYL) 500 MG tablet Take 1 tablet (500 mg total) by mouth 2 (two) times daily. Qty: 28 tablet, Refills: 0      CONTINUE these medications which have NOT CHANGED   Details  aspirin 81 MG tablet Take 81 mg by mouth daily.    clonazePAM (KLONOPIN) 0.25 MG disintegrating tablet Take 1 tablet (0.25 mg total) by mouth 2 (two) times daily as needed. Qty: 60 tablet, Refills: 1    cyclobenzaprine (FLEXERIL) 10 MG tablet Take 1 tablet (10 mg total) by mouth 2 (two) times daily as needed for muscle spasms. Qty: 20 tablet, Refills: 0    docusate sodium (COLACE) 250 MG capsule Take 1 capsule (250 mg total) by mouth daily. Qty: 10 capsule, Refills: 0    DULoxetine (CYMBALTA) 60 MG capsule Take 1 capsule (60 mg total) by mouth 2 (two) times daily. Qty: 180 capsule, Refills: 3    ENABLEX 7.5 MG 24 hr tablet Take 7.5 mg by mouth daily.    furosemide (LASIX) 20 MG tablet Take 1 tablet (20 mg total) by mouth daily. ONE TABLET ONCE DAILY X 3 DAYS THEN AS NEEDED FOR SWELLING OR SHORTNESS OF BREATH Qty: 90 tablet, Refills: 3   Associated Diagnoses: Essential hypertension; Mitral valve prolapse; Tobacco abuse; MVP (mitral valve prolapse)    !! gabapentin (NEURONTIN) 100 MG capsule Take 100 mg by mouth as needed.    !! gabapentin (NEURONTIN) 300 MG capsule Take 300 mg by mouth as needed.    hydrocortisone (ANUSOL-HC) 2.5 % rectal cream Apply  rectally 2 times daily Qty: 28 g, Refills: 1    losartan (COZAAR) 100 MG tablet Take 1 tablet (100 mg total) by mouth daily. Qty: 90 tablet, Refills: 4   Associated Diagnoses: Essential hypertension; Mitral valve prolapse; MVP (mitral valve prolapse); Tobacco abuse    methylphenidate (RITALIN) 20 MG tablet Take 1 tablet (20 mg total) by mouth daily. At noon Qty: 30 tablet, Refills: 0    modafinil (PROVIGIL) 200 MG tablet TAKE 1 TABLET EVERY DAY Qty: 30 tablet, Refills: 5    NASONEX 50 MCG/ACT nasal spray Place 50 sprays into the nose daily.    NEXIUM 40 MG capsule Take 40 mg by mouth daily.    oxyCODONE-acetaminophen (PERCOCET/ROXICET) 5-325 MG per tablet Take 2 tablets by mouth every 4 (four) hours as needed for severe pain. Qty: 20 tablet, Refills: 0    polyethylene glycol (MIRALAX) packet Take 17 g by mouth daily. Qty: 14 each, Refills: 0    potassium chloride SA (K-DUR,KLOR-CON) 20 MEQ tablet 2 tablets now and 2 tablets in 4 hours then one tablet every time you take furosemide Qty: 90 tablet, Refills: 3   Associated Diagnoses: Hypokalemia  metoprolol succinate (TOPROL XL) 25 MG 24 hr tablet Take 1 tablet (25 mg total) by mouth daily. Qty: 90 tablet, Refills: 3   Associated Diagnoses: Prolonged Q-T interval on ECG     !! - Potential duplicate medications found. Please discuss with provider.    STOP taking these medications     ibuprofen (ADVIL,MOTRIN) 800 MG tablet      nystatin (MYCOSTATIN) 100000 UNIT/ML suspension        Allergies  Allergen Reactions  . Demerol [Meperidine]     Other reaction(s): Other (See Comments) [derm] rash, swelling, itching  . Erythromycin   . Morphine And Related   . Sulfa Antibiotics   . Diflucan [Fluconazole]   . Other     Other reaction(s): Other (See Comments) Z-pak causes EKG changes, was told by cardiologist not to take this.  . Tape     Adhesive tape  . Toviaz [Fesoterodine Fumarate Er] Palpitations   Follow-up  Information    Follow up with O'KEEFFE,MICHAEL, MD.   Specialty:  Obstetrics and Gynecology   Contact information:   [address] [place] [address]       [facility]. Schedule an appointment as soon as possible for a visit in 2 weeks.   Specialty:  Internal Medicine   Contact information:   [address] [facility] [place] [id number]       The results of significant diagnostics from this hospitalization (including imaging, microbiology, ancillary and laboratory) are listed below for reference.    Significant Diagnostic Studies: Ct Abdomen Pelvis W Contrast  04/20/2014   CLINICAL DATA:  Right lower quadrant and pelvic pain.  Back pain.  EXAM: CT ABDOMEN AND PELVIS WITH CONTRAST  TECHNIQUE: Multidetector CT imaging of the abdomen and pelvis was performed using the standard protocol following bolus administration of intravenous contrast.  CONTRAST:  50mL OMNIPAQUE IOHEXOL 300 MG/ML SOLN, 100mL OMNIPAQUE IOHEXOL 300 MG/ML SOLN  COMPARISON:  None currently available  FINDINGS: BODY WALL: Unremarkable.  LOWER CHEST: Thinning and low-density of the left ventricular apex suggesting remote infarction.  Mild scarring or atelectasis in the lingula.  ABDOMEN/PELVIS:  Liver: No focal abnormality.  Biliary: No evidence of biliary obstruction or stone.  Pancreas: Unremarkable.  Spleen: Unremarkable.  Adrenals: Unremarkable.  Kidneys and ureters: No hydronephrosis or stone. 15 mm cyst in the interpolar right kidney (slightly elevated density on axial imaging is likely volume averaging).  Bladder: Low bladder floor consistent with pelvic floor laxity.  Reproductive: Hysterectomy.  Negative ovaries.  Bowel: No obstruction. Normal appendix.  Retroperitoneum: No mass or adenopathy.  Peritoneum: No ascites or pneumoperitoneum.  Vascular: Scattered atherosclerosis.  OSSEOUS: Lower lumbar facet degeneration with mild slip at L4-5. Small, benign and  peripherally sclerotic lucency in the right femoral neck.  IMPRESSION: 1. No acute intra-abdominal findings, including appendicitis. 2. 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   Electronically Signed   By: Tiburcio PeaJonathan  Watts M.D.   On: 04/20/2014 23:37   Microbiology: Recent Results (from the past 240 hour(s))  Blood culture (routine x 2)     Status: None (Preliminary result)   Collection Time: 04/22/14  3:45 PM  Result Value Ref Range Status   Specimen Description BLOOD RIGHT [facility]  Final   Special Requests   Final    Normal BOTTLES DRAWN AEROBIC AND ANAEROBIC 5CC EACH   Culture  Setup Time   Final    04/22/2014 22:36 Performed at [facility]  Culture   Final           BLOOD CULTURE RECEIVED NO GROWTH TO DATE CULTURE WILL BE HELD FOR 5 DAYS BEFORE ISSUING A FINAL NEGATIVE REPORT Performed at [facility]    Report Status PENDING  Incomplete  Blood culture (routine x 2)     Status: None (Preliminary result)   Collection Time: 04/22/14  4:00 PM  Result Value Ref Range Status   Specimen Description BLOOD LEFT ARM  Final   Special Requests BOTTLES DRAWN AEROBIC AND ANAEROBIC 5CC EACH  Final   Culture  Setup Time   Final    04/22/2014 22:36 Performed at [facility]    Culture   Final           BLOOD CULTURE RECEIVED NO GROWTH TO DATE CULTURE WILL BE HELD FOR 5 DAYS BEFORE ISSUING A FINAL NEGATIVE REPORT Performed at [facility]    Report Status PENDING  Incomplete     Labs: Basic Metabolic Panel:  Recent Labs Lab 04/20/14 1848 04/22/14 1545 04/23/14 0415  NA 141 141 140  K 3.4* 3.5* 3.4*  CL 104 103 104  CO2 24 26 24   GLUCOSE 95 70 114*  BUN 5* 4* 6  CREATININE 0.70 0.70 0.82  CALCIUM 9.2 8.8 8.6   Liver Function Tests:  Recent Labs Lab 04/22/14 1545  AST 23  ALT 28  ALKPHOS 166*  BILITOT 0.3  PROT 7.2  ALBUMIN 3.4*   CBC:  Recent Labs Lab 04/20/14 1848 04/22/14 1545 04/23/14 0415  WBC 14.9* 15.5* 10.1   NEUTROABS 9.7* 11.7*  --   HGB 13.3 12.7 12.0  HCT 38.8 37.3 36.7  MCV 83.4 83.6 85.9  PLT 410* 408* 400   Signed:  Vassie Loll  [facility] 04/23/2014, 12:59 PM

## 2014-04-23 NOTE — Care Management Note (Signed)
    Page 1 of 1   04/23/2014     3:50:11 PM CARE MANAGEMENT NOTE 04/23/2014  Patient:  Summer Henderson, Summer Henderson   Account Number:  [id number]  Date Initiated:  04/23/2014  Documentation initiated by:  Lanier Clam  Subjective/Objective Assessment:   53 y/o f admitted w/vaginal pain, sirs     Action/Plan:   From home.   Anticipated DC Date:  04/23/2014   Anticipated DC Plan:  HOME/SELF CARE      DC Planning Services  CM consult      Choice offered to / List presented to:             Status of service:  Completed, signed off Medicare Important Message given?   (If response is "NO", the following Medicare IM given date fields will be blank) Date Medicare IM given:   Medicare IM given by:   Date Additional Medicare IM given:   Additional Medicare IM given by:    Discharge Disposition:  HOME/SELF CARE  Per UR Regulation:  Reviewed for med. necessity/level of care/duration of stay  If discussed at Long Length of Stay Meetings, dates discussed:    Comments:  04/23/14 Lanier Clam RN BAN NCM [phone] no d/c needs or orders.

## 2014-04-23 NOTE — Progress Notes (Signed)
04/23/14  1340  Reviewed discharge instructions with patient. Patient verbalized understanding of discharge instructions.  Copy of discharge instructions and prescription given to patient.

## 2014-04-24 LAB — GC/CHLAMYDIA PROBE AMP
CT PROBE, AMP APTIMA: NEGATIVE
GC Probe RNA: NEGATIVE

## 2014-04-24 NOTE — Progress Notes (Signed)
Results of Blood culture given to Summer SpannerKathryn Short, NP. Aerobic bottle with gram positive cocci in clusters.

## 2014-04-25 LAB — CULTURE, BLOOD (ROUTINE X 2): Special Requests: NORMAL

## 2014-04-28 LAB — CULTURE, BLOOD (ROUTINE X 2): Culture: NO GROWTH

## 2014-04-29 ENCOUNTER — Ambulatory Visit: Payer: Medicare Other | Admitting: Nurse Practitioner

## 2014-05-11 ENCOUNTER — Ambulatory Visit (INDEPENDENT_AMBULATORY_CARE_PROVIDER_SITE_OTHER): Payer: Medicare Other | Admitting: Nurse Practitioner

## 2014-05-11 ENCOUNTER — Encounter: Payer: Self-pay | Admitting: Nurse Practitioner

## 2014-05-11 ENCOUNTER — Telehealth: Payer: Self-pay | Admitting: *Deleted

## 2014-05-11 ENCOUNTER — Encounter: Payer: Medicare Other | Admitting: Obstetrics & Gynecology

## 2014-05-11 ENCOUNTER — Encounter: Payer: Self-pay | Admitting: *Deleted

## 2014-05-11 VITALS — BP 150/88 | HR 88 | Ht 61.75 in | Wt 168.1 lb

## 2014-05-11 DIAGNOSIS — K625 Hemorrhage of anus and rectum: Secondary | ICD-10-CM

## 2014-05-11 DIAGNOSIS — Z1211 Encounter for screening for malignant neoplasm of colon: Secondary | ICD-10-CM

## 2014-05-11 MED ORDER — MOVIPREP 100 G PO SOLR: 1.0000 | Freq: Once | ORAL | Status: DC

## 2014-05-11 MED ORDER — HYDROCORTISONE ACETATE 25 MG RE SUPP: 25.0000 mg | Freq: Two times a day (BID) | RECTAL | Status: DC

## 2014-05-11 NOTE — Telephone Encounter (Signed)
  RE: Summer Henderson DOB: 17-Aug-1960 MRN: [id number]   Dear Dr Jens Som,    We have scheduled the above patient for an endoscopic procedure. Our records show that she had a recent office visit with you to discuss Mitral Valve Prolapse and other cardiac issues.  Please advise if patient can be cleared to have a Colonoscopy performed, which is scheduled for 06-29-2014.  Please route the completed form to Landry Dyke., CMA  Sincerely,    [place] Anis

## 2014-05-11 NOTE — Telephone Encounter (Signed)
If nuclear study scheduled for later this month shows no ischemia, she may proceed with endoscopy. Summer Henderson

## 2014-05-11 NOTE — Patient Instructions (Signed)
You have been scheduled for a colonoscopy. Please follow written instructions given to you at your visit today.  Please pick up your prep kit at the pharmacy within the next 1-3 days. If you use inhalers (even only as needed), please bring them with you on the day of your procedure. Your physician has requested that you go to www.startemmi.com and enter the access code given to you at your visit today(SENT TO YOUR E-MAIL). This web site gives a general overview about your procedure. However, you should still follow specific instructions given to you by our office regarding your preparation for the procedure.  We have sent the following medications to your pharmacy for you to pick up at your convenience: Anusol Suppositories  

## 2014-05-12 NOTE — Progress Notes (Signed)
HPI :  Patient is a 54 year old female, new to the practice, referred from the emergency department for evaluation of rectal bleeding. In mid December patient went to [facility] for evaluation of rectal bleeding in the setting of constipation. There was scant bright red blood in the vault on exam. White count was elevated at 14.9, hemoglobin 13.3. CT scan of the abdomen and pelvis negative for any acute abnormalities. She was discharged home with Colace and MiraLAX.Summer Henderson Patient returned to the emergency room at 2 days later with complaints of vaginal pain. On exam there was purulent discharge from the posterior aspect of the vagina. Emergency department spoke with GYN on call. Patient had temp over 101, the decision was made to transfer her from [place] to [facility] where GYN would see her in consultation. Patient upset that she spent 2 days in the hospital and GYN never came to see her. Outpatient GYN follow-up was arranged prior to discharge. Patient tells me that her gynecologist says she did not have hemorrhoids nor a fistula but there was concern for abscess. No further vaginal pain. No further bleeding.   Past Medical History  Diagnosis Date  . Multiple sclerosis   . Chronic fatigue   . Cervical dysplasia   . GERD (gastroesophageal reflux disease)   . Carpal tunnel syndrome   . Prolonged QT interval   . Hypertension   . Mitral valve prolapse   . Anxiety   . Arthritis   . Depression   . Pneumonia     Family History  Problem Relation Age of Onset  . Melanoma Mother   . Breast cancer Maternal Grandmother   . Brain cancer Maternal Grandfather   . Lung cancer Maternal Grandfather   . Colon cancer Maternal Grandmother   . Colon polyps Mother   . Diabetes Mother   . Diabetes Maternal Grandfather   . Kidney disease Neg Hx   . Esophageal cancer Neg Hx   . Gallbladder disease Neg Hx    History  Substance Use Topics  . Smoking status: Current Every Day Smoker -- 1.00  packs/day for 35 years    Types: Cigarettes  . Smokeless tobacco: Never Used  . Alcohol Use: No   Current Outpatient Prescriptions  Medication Sig Dispense Refill  . aspirin 81 MG tablet Take 81 mg by mouth daily.    . clonazePAM (KLONOPIN) 0.25 MG disintegrating tablet Take 1 tablet (0.25 mg total) by mouth 2 (two) times daily as needed. 60 tablet 1  . cyclobenzaprine (FLEXERIL) 10 MG tablet Take 1 tablet (10 mg total) by mouth 2 (two) times daily as needed for muscle spasms. 20 tablet 0  . docusate sodium (COLACE) 250 MG capsule Take 1 capsule (250 mg total) by mouth daily. 10 capsule 0  . DULoxetine (CYMBALTA) 60 MG capsule Take 1 capsule (60 mg total) by mouth 2 (two) times daily. (Patient taking differently: Take 60 mg by mouth daily. ) 180 capsule 3  . ENABLEX 7.5 MG 24 hr tablet Take 7.5 mg by mouth daily.    . furosemide (LASIX) 20 MG tablet Take 1 tablet (20 mg total) by mouth daily. ONE TABLET ONCE DAILY X 3 DAYS THEN AS NEEDED FOR SWELLING OR SHORTNESS OF BREATH 90 tablet 3  . gabapentin (NEURONTIN) 100 MG capsule Take 100 mg by mouth as needed.    . gabapentin (NEURONTIN) 300 MG capsule Take 300 mg by mouth as needed.    Summer Henderson losartan (COZAAR) 100 MG tablet  Take 1 tablet (100 mg total) by mouth daily. 90 tablet 4  . methylphenidate (RITALIN) 20 MG tablet Take 1 tablet (20 mg total) by mouth daily. At noon 30 tablet 0  . metoprolol succinate (TOPROL XL) 25 MG 24 hr tablet Take 1 tablet (25 mg total) by mouth daily. 90 tablet 3  . modafinil (PROVIGIL) 200 MG tablet TAKE 1 TABLET EVERY DAY (Patient taking differently: TAKE 1 TABLET EVERY DAY AS NEEDED) 30 tablet 5  . NASONEX 50 MCG/ACT nasal spray Place 50 sprays into the nose daily.    Summer Henderson NEXIUM 40 MG capsule Take 40 mg by mouth daily.    Summer Henderson oxyCODONE-acetaminophen (PERCOCET/ROXICET) 5-325 MG per tablet Take 2 tablets by mouth every 4 (four) hours as needed for severe pain. 20 tablet 0  . polyethylene glycol (MIRALAX) packet Take 17 g by  mouth daily. 14 each 0  . potassium chloride SA (K-DUR,KLOR-CON) 20 MEQ tablet 2 tablets now and 2 tablets in 4 hours then one tablet every time you take furosemide 90 tablet 3  . hydrocortisone (ANUSOL-HC) 25 MG suppository Place 1 suppository (25 mg total) rectally every 12 (twelve) hours. 12 suppository 0  . MOVIPREP 100 G SOLR Take 1 kit (200 g total) by mouth once. 1 kit 0   No current facility-administered medications for this visit.   Allergies  Allergen Reactions  . Demerol [Meperidine]     Other reaction(s): Other (See Comments) [derm] rash, swelling, itching  . Erythromycin   . Morphine And Related   . Sulfa Antibiotics   . Diflucan [Fluconazole]   . Other     Other reaction(s): Other (See Comments) Z-pak causes EKG changes, was told by cardiologist not to take this.  . Tape     Adhesive tape  . Toviaz [Fesoterodine Fumarate Er] Palpitations     Review of Systems: All systems reviewed and negative except where noted in HPI.    Ct Abdomen Pelvis W Contrast  04/20/2014   CLINICAL DATA:  Right lower quadrant and pelvic pain.  Back pain.  EXAM: CT ABDOMEN AND PELVIS WITH CONTRAST  TECHNIQUE: Multidetector CT imaging of the abdomen and pelvis was performed using the standard protocol following bolus administration of intravenous contrast.  CONTRAST:  61m OMNIPAQUE IOHEXOL 300 MG/ML SOLN, 1069mOMNIPAQUE IOHEXOL 300 MG/ML SOLN  COMPARISON:  None currently available  FINDINGS: BODY WALL: Unremarkable.  LOWER CHEST: Thinning and low-density of the left ventricular apex suggesting remote infarction.  Mild scarring or atelectasis in the lingula.  ABDOMEN/PELVIS:  Liver: No focal abnormality.  Biliary: No evidence of biliary obstruction or stone.  Pancreas: Unremarkable.  Spleen: Unremarkable.  Adrenals: Unremarkable.  Kidneys and ureters: No hydronephrosis or stone. 15 mm cyst in the interpolar right kidney (slightly elevated density on axial imaging is likely volume averaging).   Bladder: Low bladder floor consistent with pelvic floor laxity.  Reproductive: Hysterectomy.  Negative ovaries.  Bowel: No obstruction. Normal appendix.  Retroperitoneum: No mass or adenopathy.  Peritoneum: No ascites or pneumoperitoneum.  Vascular: Scattered atherosclerosis.  OSSEOUS: Lower lumbar facet degeneration with mild slip at L4-5. Small, benign and peripherally sclerotic lucency in the right femoral neck.  IMPRESSION: 1. No acute intra-abdominal findings, including appendicitis. 2. 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   Electronically Signed   By: JoJorje Guild.D.   On: 04/20/2014 23:37    Physical Exam: BP 150/88 mmHg  Pulse 88  Ht 5' 1.75" (1.568 m)  Wt 168 lb 2 oz (  76.261 kg)  BMI 31.02 kg/m2 Constitutional: Pleasant,well-developed, white female in no acute distress. HEENT: Normocephalic and atraumatic. Conjunctivae are normal. No scleral icterus. Neck supple.  Cardiovascular: Normal rate, regular rhythm.  Pulmonary/chest: Effort normal and breath sounds normal. No wheezing, rales or rhonchi. Abdominal: Soft, nondistended, nontender. Bowel sounds active throughout. There are no masses palpable. No hepatomegaly. Rectal: perianal tags. On anoscopy there were some mildly inflamed hemorrhoids present Extremities: no edema Lymphadenopathy: No cervical adenopathy noted. Neurological: Alert and oriented to person place and time. Skin: Skin is warm and dry. No rashes noted. Psychiatric: Normal mood and affect. Behavior is normal.   ASSESSMENT AND PLAN:  22. 54 year old female with recent rectal bleeding . Suspect bleeding secondary to internal hemorrhoids but the patient has never had a colonoscopy. She is agreeable to proceeding with colonoscopy to rule out other etiologies of bleeding.The risks, benefits, and alternatives to colonoscopy with possible biopsy and possible polypectomy were discussed with the patient and she consents to proceed.   2. Mild internal  hemorrhoids. Patient is not having any bleeding at this time. I asked her to use Anusol-HC suppositories the week preceding the bowel prep  3. Multiple sclerosis   4. Anxiety / depression

## 2014-05-13 ENCOUNTER — Telehealth: Payer: Self-pay | Admitting: Neurology

## 2014-05-13 DIAGNOSIS — Z1211 Encounter for screening for malignant neoplasm of colon: Secondary | ICD-10-CM | POA: Insufficient documentation

## 2014-05-13 DIAGNOSIS — K625 Hemorrhage of anus and rectum: Secondary | ICD-10-CM | POA: Insufficient documentation

## 2014-05-13 NOTE — Telephone Encounter (Signed)
Patient is calling concerning the status of MS medication(Tefidera), has contacted Insurance and they said that it has been approved and to contact Dr Anne Hahn, please send to [facility].

## 2014-05-13 NOTE — Telephone Encounter (Signed)
[employer] handles the triage of this Rx to patient's contracted pharmacy.  I called [employer] at [phone].  Spoke with Richard.  He said they spoke with the specialty pharmacy yesterday and were told they have everything needed, med is ready to be shipped out, pending patient scheduling delivery.  He had a contact number for the pharmacy of [phone].  I called them.  Spoke with Revonda Standard.  She reviewed the file and confirmed everything is ready to go, they are just waiting for the patient to call them and schedule delivery.  I called the patient back at home, got no answer, mailbox was full.  Unable to leave message.  I called cell.  Spoke with patient and provided her with this info.  She will contact them and call us back if needed.

## 2014-05-14 NOTE — Progress Notes (Signed)
Reviewed and agree with management. Robert D. Kaplan, M.D., FACG  

## 2014-05-21 ENCOUNTER — Other Ambulatory Visit: Payer: Self-pay | Admitting: Neurology

## 2014-05-21 MED ORDER — METHYLPHENIDATE HCL 20 MG PO TABS: 20.0000 mg | ORAL_TABLET | Freq: Every day | ORAL | Status: DC

## 2014-05-21 NOTE — Telephone Encounter (Signed)
Pt is calling requesting refill on methylphenidate (RITALIN) 20 MG tablet. Please call when ready for pick up.

## 2014-05-21 NOTE — Telephone Encounter (Signed)
I called the patient to let them know their Rx for Ritalin was ready for pickup. Patient was instructed to bring Photo ID.  Oneal Deputy will be picking up the patients medication.

## 2014-05-21 NOTE — Telephone Encounter (Signed)
Request entered, forwarded to provider for approval.  

## 2014-05-28 ENCOUNTER — Telehealth (HOSPITAL_COMMUNITY): Payer: Self-pay

## 2014-05-28 NOTE — Telephone Encounter (Signed)
Encounter complete. 

## 2014-06-03 ENCOUNTER — Ambulatory Visit (HOSPITAL_COMMUNITY)
Admission: RE | Admit: 2014-06-03 | Discharge: 2014-06-03 | Disposition: A | Discharge status: Home / Self Care | Payer: Medicare Other | Source: Ambulatory Visit | Attending: Cardiovascular Disease | Admitting: Cardiovascular Disease

## 2014-06-03 DIAGNOSIS — R9431 Abnormal electrocardiogram [ECG] [EKG]: Secondary | ICD-10-CM

## 2014-06-03 DIAGNOSIS — R0609 Other forms of dyspnea: Secondary | ICD-10-CM | POA: Diagnosis not present

## 2014-06-03 DIAGNOSIS — E663 Overweight: Secondary | ICD-10-CM | POA: Insufficient documentation

## 2014-06-03 DIAGNOSIS — R5383 Other fatigue: Secondary | ICD-10-CM | POA: Insufficient documentation

## 2014-06-03 DIAGNOSIS — I4581 Long QT syndrome: Secondary | ICD-10-CM

## 2014-06-03 DIAGNOSIS — R42 Dizziness and giddiness: Secondary | ICD-10-CM | POA: Insufficient documentation

## 2014-06-03 DIAGNOSIS — I10 Essential (primary) hypertension: Secondary | ICD-10-CM | POA: Diagnosis not present

## 2014-06-03 DIAGNOSIS — Z8249 Family history of ischemic heart disease and other diseases of the circulatory system: Secondary | ICD-10-CM | POA: Diagnosis not present

## 2014-06-03 DIAGNOSIS — Z720 Tobacco use: Secondary | ICD-10-CM | POA: Diagnosis not present

## 2014-06-03 MED ORDER — TECHNETIUM TC 99M SESTAMIBI GENERIC - CARDIOLITE
10.6000 | Freq: Once | INTRAVENOUS | Status: AC | PRN
  Administered 2014-06-03: 11 via INTRAVENOUS

## 2014-06-03 MED ORDER — TECHNETIUM TC 99M SESTAMIBI GENERIC - CARDIOLITE
30.5000 | Freq: Once | INTRAVENOUS | Status: AC | PRN
  Administered 2014-06-03: 31 via INTRAVENOUS

## 2014-06-03 NOTE — Procedures (Addendum)
[facility] [address] [place] [address] [place] [address] [phone]  Cardiology Nuclear Med Study  Summer Henderson is a 54 y.o. female     MRN : [id number]     DOB: 1961-02-28  Procedure Date: 06/03/2014  Nuclear Med Background Indication for Stress Test:  Evaluation for Ischemia and Post Hospital;Abnormal CT scan History:  Asthma, COPD, MVP and MS;MVP;No prior NUC MPI fo rcomparison. Cardiac Risk Factors: Family History - CAD, Hypertension, Overweight and Smoker  Symptoms:  Dizziness, DOE, Fatigue and SOB   Nuclear Pre-Procedure Caffeine/Decaff Intake:  1:00am NPO After: 11am   IV Site: R Forearm  IV 0.9% NS with Angio Cath:  22g  Chest Size (in):  n/a IV Started by: Berdie Ogren, RN  Height: 5\' 2"  (1.575 m)  Cup Size: C  BMI:  Body mass index is 30.72 kg/(m^2). Weight:  168 lb (76.204 kg)   Tech Comments:  n/a    Nuclear Med Study 1 or 2 day study: 1 day  Stress Test Type:  Stress  Order Authorizing Provider:  Olga Millers, MD   Resting Radionuclide: Technetium 74m Sestamibi  Resting Radionuclide Dose: 10.6 mCi   Stress Radionuclide:  Technetium 3m Sestamibi  Stress Radionuclide Dose: 30.5 mCi           Stress Protocol Rest HR: 86 Stress HR: 155  Rest BP: 153/93 Stress BP: 200/96  Exercise Time (min): 7 METS: 8.5   Predicted Max HR: 169 bpm % Max HR: 91.72 bpm Rate Pressure Product: 97673  Dose of Adenosine (mg):  n/a Dose of Lexiscan: n/a mg  Dose of Atropine (mg): n/a Dose of Dobutamine: n/a mcg/kg/min (at max HR)  Stress Test Technologist: Esperanza Sheets, CCT Nuclear Technologist: Gonzella Lex, CNMT   Rest Procedure:  Myocardial perfusion imaging was performed at rest 45 minutes following the intravenous administration of Technetium 65m Sestamibi. Stress Procedure:  The patient performed treadmill exercise using a Bruce  Protocol for 7 minutes. The patient stopped due to SOB and Fatigue and denied any chest  pain.  There were no significant ST-T wave changes.  Technetium 69m Sestamibi was injected IV at peak exercise and myocardial perfusion imaging was performed after a brief delay.  Transient Ischemic Dilatation (Normal <1.22):  1.04  QGS EDV:  99 ml QGS ESV:  44 ml LV Ejection Fraction: 56%      Rest ECG: NSR - Normal EKG  Stress ECG: Insignificant upsloping ST segment depression.  QPS Raw Data Images:  Normal; no motion artifact; normal heart/lung ratio. Stress Images:  Small, mild defect in the mid anteroseptal wall. Rest Images:  Small mild mid anteroseptal defect but with improved perfusion c/y stress images Subtraction (SDS):  Mild breast attenuation with mild ischemia  Impression Exercise Capacity:  Good exercise capacity. BP Response:  Hypertensive blood pressure response. Clinical Symptoms:  Mild shortness of breath without chest pain. ECG Impression:  Nondiagnostic 0.5 to < 1 mm upsloping ST depression Comparison with Prior Nuclear Study: No images to compare  Overall Impression:  Low risk stress nuclear study demonstrating probable mild breast attenuation but with mild mid anteroseptal ischemia.  LV Wall Motion:  NL LV Function, EF 56%; NL Wall Motion   Lennette Bihari, MD  06/03/2014 6:16 PM

## 2014-06-08 ENCOUNTER — Encounter: Payer: Self-pay | Admitting: *Deleted

## 2014-06-08 NOTE — Telephone Encounter (Signed)
error 

## 2014-06-08 NOTE — Telephone Encounter (Signed)
Dr Jens Som,    I noticed on report patient has mild ischemia. Does that mean we need to cancel her Endoscopy??

## 2014-06-08 NOTE — Telephone Encounter (Signed)
Ok for endoscopy Brian Crenshaw  

## 2014-06-16 NOTE — Progress Notes (Signed)
HPI: FU hypertension, prolonged QT and mitral valve prolapse. Previously followed in [facility]. Holter monitor in September of 2012 showed sinus rhythm with rare PVC. Echocardiogram in June of 2015 showed normal LV function and mild left atrial enlargement. There was trace mitral regurgitation. Patient had an abdominal CT for abdominal pain in December 2015. 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 Nuclear study January 2016 showed an ejection fraction of 56%, probable mild breast attenuation with possible mild mid anteroseptal ischemia. Since she was last seen, she notes dyspnea on exertion but no orthopnea or PND. She has had continuous chest pain on the right since previous nuclear study.   Current Outpatient Prescriptions  Medication Sig Dispense Refill  . aspirin 81 MG tablet Take 81 mg by mouth daily.    . clonazePAM (KLONOPIN) 0.25 MG disintegrating tablet Take 1 tablet (0.25 mg total) by mouth 2 (two) times daily as needed. 60 tablet 1  . cyclobenzaprine (FLEXERIL) 10 MG tablet Take 1 tablet (10 mg total) by mouth 2 (two) times daily as needed for muscle spasms. 20 tablet 0  . docusate sodium (COLACE) 250 MG capsule Take 1 capsule (250 mg total) by mouth daily. 10 capsule 0  . DULoxetine (CYMBALTA) 60 MG capsule Take 1 capsule (60 mg total) by mouth 2 (two) times daily. (Patient taking differently: Take 60 mg by mouth daily. ) 180 capsule 3  . ENABLEX 7.5 MG 24 hr tablet Take 7.5 mg by mouth daily.    . furosemide (LASIX) 20 MG tablet Take 1 tablet (20 mg total) by mouth daily. ONE TABLET ONCE DAILY X 3 DAYS THEN AS NEEDED FOR SWELLING OR SHORTNESS OF BREATH 90 tablet 3  . gabapentin (NEURONTIN) 100 MG capsule Take 100 mg by mouth as needed (for pain).     Marland Kitchen gabapentin (NEURONTIN) 300 MG capsule Take 300 mg by mouth as needed (for pain).     . hydrocortisone (ANUSOL-HC) 25 MG suppository Place 1 suppository (25 mg total) rectally  every 12 (twelve) hours. 12 suppository 0  . losartan (COZAAR) 100 MG tablet Take 1 tablet (100 mg total) by mouth daily. 90 tablet 4  . methylphenidate (RITALIN) 20 MG tablet Take 1 tablet (20 mg total) by mouth daily. At noon 30 tablet 0  . metoprolol succinate (TOPROL XL) 25 MG 24 hr tablet Take 1 tablet (25 mg total) by mouth daily. 90 tablet 3  . modafinil (PROVIGIL) 200 MG tablet TAKE 1 TABLET EVERY DAY (Patient taking differently: TAKE 1 TABLET EVERY DAY AS NEEDED) 30 tablet 5  . NASONEX 50 MCG/ACT nasal spray Place 50 sprays into the nose daily.    Marland Kitchen NEXIUM 40 MG capsule Take 40 mg by mouth daily.    Marland Kitchen oxyCODONE-acetaminophen (PERCOCET/ROXICET) 5-325 MG per tablet Take 2 tablets by mouth every 4 (four) hours as needed for severe pain. 20 tablet 0  . polyethylene glycol (MIRALAX) packet Take 17 g by mouth daily. 14 each 0  . potassium chloride SA (K-DUR,KLOR-CON) 20 MEQ tablet 2 tablets now and 2 tablets in 4 hours then one tablet every time you take furosemide 90 tablet 3  . MOVIPREP 100 G SOLR Take 1 kit (200 g total) by mouth once. (Patient not taking: Reported on 06/17/2014) 1 kit 0   No current facility-administered medications for this visit.     Past Medical History  Diagnosis Date  . Multiple sclerosis   . Chronic fatigue   .  Cervical dysplasia   . GERD (gastroesophageal reflux disease)   . Carpal tunnel syndrome   . Prolonged QT interval   . Hypertension   . Mitral valve prolapse   . Anxiety   . Arthritis   . Depression   . Pneumonia     Past Surgical History  Procedure Laterality Date  . Rectovaginal fistula repair    . Abdominal hysterectomy    . Dilation and curettage of uterus    . Foot surgery Bilateral     For bunions  . Carpal tunnel release    . Bladder resuspension    . Hammer toe surgery Left   . Cervical spine surgery  10-16-2009  . Orif toe fracture Right   . Biceps tendon repair    . Prolong qtc      Heart    History   Social History  .  Marital Status: Divorced    Spouse Name: N/A  . Number of Children: 2  . Years of Education: 13   Occupational History  . Disabled     Disability   Social History Main Topics  . Smoking status: Current Every Day Smoker -- 1.00 packs/day for 35 years    Types: Cigarettes  . Smokeless tobacco: Never Used  . Alcohol Use: No  . Drug Use: No  . Sexual Activity: Not on file   Other Topics Concern  . Not on file   Social History Narrative    ROS: no fevers or chills, productive cough, hemoptysis, dysphasia, odynophagia, melena, hematochezia, dysuria, hematuria, rash, seizure activity, orthopnea, PND, pedal edema, claudication. Remaining systems are negative.  Physical Exam: Well-developed well-nourished in no acute distress.  Skin is warm and dry.  HEENT is normal.  Neck is supple.  Chest is clear to auscultation with normal expansion.  Cardiovascular exam is regular rate and rhythm.  Abdominal exam nontender or distended. No masses palpated. Extremities show no edema. neuro grossly intact

## 2014-06-17 ENCOUNTER — Encounter: Payer: Self-pay | Admitting: *Deleted

## 2014-06-17 ENCOUNTER — Other Ambulatory Visit: Payer: Self-pay | Admitting: Cardiology

## 2014-06-17 ENCOUNTER — Telehealth: Payer: Self-pay | Admitting: Internal Medicine

## 2014-06-17 ENCOUNTER — Ambulatory Visit (INDEPENDENT_AMBULATORY_CARE_PROVIDER_SITE_OTHER): Payer: Medicare Other | Admitting: Cardiology

## 2014-06-17 ENCOUNTER — Telehealth: Payer: Self-pay

## 2014-06-17 ENCOUNTER — Ambulatory Visit (HOSPITAL_BASED_OUTPATIENT_CLINIC_OR_DEPARTMENT_OTHER)
Admission: RE | Admit: 2014-06-17 | Discharge: 2014-06-17 | Disposition: A | Discharge status: Home / Self Care | Payer: Medicare Other | Source: Ambulatory Visit | Attending: Cardiology | Admitting: Cardiology

## 2014-06-17 ENCOUNTER — Encounter: Payer: Self-pay | Admitting: Cardiology

## 2014-06-17 VITALS — BP 152/80 | HR 98 | Ht 62.0 in | Wt 172.0 lb

## 2014-06-17 DIAGNOSIS — R609 Edema, unspecified: Secondary | ICD-10-CM | POA: Insufficient documentation

## 2014-06-17 DIAGNOSIS — Z720 Tobacco use: Secondary | ICD-10-CM

## 2014-06-17 DIAGNOSIS — Z01811 Encounter for preprocedural respiratory examination: Secondary | ICD-10-CM | POA: Diagnosis not present

## 2014-06-17 DIAGNOSIS — I341 Nonrheumatic mitral (valve) prolapse: Secondary | ICD-10-CM

## 2014-06-17 DIAGNOSIS — I2510 Atherosclerotic heart disease of native coronary artery without angina pectoris: Secondary | ICD-10-CM | POA: Diagnosis not present

## 2014-06-17 DIAGNOSIS — R9430 Abnormal result of cardiovascular function study, unspecified: Secondary | ICD-10-CM

## 2014-06-17 DIAGNOSIS — I4581 Long QT syndrome: Secondary | ICD-10-CM | POA: Diagnosis not present

## 2014-06-17 DIAGNOSIS — I10 Essential (primary) hypertension: Secondary | ICD-10-CM

## 2014-06-17 DIAGNOSIS — R9431 Abnormal electrocardiogram [ECG] [EKG]: Secondary | ICD-10-CM

## 2014-06-17 DIAGNOSIS — F17200 Nicotine dependence, unspecified, uncomplicated: Secondary | ICD-10-CM | POA: Insufficient documentation

## 2014-06-17 DIAGNOSIS — R0600 Dyspnea, unspecified: Secondary | ICD-10-CM

## 2014-06-17 LAB — CBC
HCT: 39.9 % (ref 36.0–46.0)
Hemoglobin: 13.2 g/dL (ref 12.0–15.0)
MCH: 27.1 pg (ref 26.0–34.0)
MCHC: 33.1 g/dL (ref 30.0–36.0)
MCV: 81.9 fL (ref 78.0–100.0)
MPV: 10 fL (ref 8.6–12.4)
Platelets: 450 10*3/uL — ABNORMAL HIGH (ref 150–400)
RBC: 4.87 MIL/uL (ref 3.87–5.11)
RDW: 15.1 % (ref 11.5–15.5)
WBC: 6.2 10*3/uL (ref 4.0–10.5)

## 2014-06-17 LAB — BASIC METABOLIC PANEL WITH GFR
BUN: 5 mg/dL — AB (ref 6–23)
CHLORIDE: 108 meq/L (ref 96–112)
CO2: 24 meq/L (ref 19–32)
Calcium: 9.6 mg/dL (ref 8.4–10.5)
Creat: 0.66 mg/dL (ref 0.50–1.10)
GFR, Est African American: >89 mL/min
GFR, Est Non African American: >89 mL/min
Glucose, Bld: 80 mg/dL (ref 70–99)
POTASSIUM: 4.4 meq/L (ref 3.5–5.3)
Sodium: 141 mEq/L (ref 135–145)

## 2014-06-17 LAB — PROTIME-INR
INR: 0.97 (ref <1.50)
Prothrombin Time: 12.9 seconds (ref 11.6–15.2)

## 2014-06-17 MED ORDER — METOPROLOL SUCCINATE ER 50 MG PO TB24: 50.0000 mg | ORAL_TABLET | Freq: Every day | ORAL | Status: DC

## 2014-06-17 NOTE — Assessment & Plan Note (Signed)
Blood pressure elevated. Increase Toprol to 50 mg daily. 

## 2014-06-17 NOTE — Assessment & Plan Note (Signed)
Long discussion today concerning recent nuclear study. Her defect may be related to breast attenuation. Cannot rule out ischemia. She is concerned about the results and wants definitive evaluation. She has some dyspnea on exertion and atypical chest pain as well. Plan cardiac catheterization for definitive evaluation. The risks and benefits including but not limited to stroke, myocardial infarction and death discussed. Patient agrees to proceed. Continue aspirin.

## 2014-06-17 NOTE — Assessment & Plan Note (Signed)
No history of syncope. Avoid meds that prolonged QT interval.

## 2014-06-17 NOTE — Patient Instructions (Addendum)
Your physician recommends that you schedule a follow-up appointment in: 8 WEEKS WITH DR CRENSHAW  Your physician recommends that you HAVE LAB WORK TODAY  A chest x-ray takes a picture of the organs and structures inside the chest, including the heart, lungs, and blood vessels. This test can show several things, including, whether the heart is enlarges; whether fluid is building up in the lungs; and whether pacemaker / defibrillator leads are still in place.   Your physician has requested that you have a cardiac catheterization. Cardiac catheterization is used to diagnose and/or treat various heart conditions. Doctors may recommend this procedure for a number of different reasons. The most common reason is to evaluate chest pain. Chest pain can be a symptom of coronary artery disease (CAD), and cardiac catheterization can show whether plaque is narrowing or blocking your heart's arteries. This procedure is also used to evaluate the valves, as well as measure the blood flow and oxygen levels in different parts of your heart. For further information please visit [email or web address] Please follow instruction sheet, as given.  INCREASE METOPROLOL TO 50 MG ONCE DAILY=2 OF THE 25 MG TABLETS ONCE DAILY   Coronary Angiogram A coronary angiogram, also called coronary angiography, is an X-ray procedure used to look at the arteries in the heart. In this procedure, a dye (contrast dye) is injected through a long, hollow tube (catheter). The catheter is about the size of a piece of cooked spaghetti and is inserted through your groin, wrist, or arm. The dye is injected into each artery, and X-rays are then taken to show if there is a blockage in the arteries of your heart. LET [facility] CARE PROVIDER KNOW ABOUT:  Any allergies you have, including allergies to shellfish or contrast dye.   All medicines you are taking, including vitamins, herbs, eye drops, creams, and over-the-counter medicines.   Previous  problems you or members of your family have had with the use of anesthetics.   Any blood disorders you have.   Previous surgeries you have had.  History of kidney problems or failure.   Other medical conditions you have. RISKS AND COMPLICATIONS  Generally, a coronary angiogram is a safe procedure. However, problems can occur and include:  Allergic reaction to the dye.  Bleeding from the access site or other locations.  Kidney injury, especially in people with impaired kidney function.  Stroke (rare).  Heart attack (rare). BEFORE THE PROCEDURE   Do not eat or drink anything after midnight the night before the procedure or as directed by your health care provider.   Ask your health care provider about changing or stopping your regular medicines. This is especially important if you are taking diabetes medicines or blood thinners. PROCEDURE  You may be given a medicine to help you relax (sedative) before the procedure. This medicine is given through an intravenous (IV) access tube that is inserted into one of your veins.   The area where the catheter will be inserted will be washed and shaved. This is usually done in the groin but may be done in the fold of your arm (near your elbow) or in the wrist.   A medicine will be given to numb the area where the catheter will be inserted (local anesthetic).   The health care provider will insert the catheter into an artery. The catheter will be guided by using a special type of X-ray (fluoroscopy) of the blood vessel being examined.   A special dye will then be injected into  the catheter, and X-rays will be taken. The dye will help to show where any narrowing or blockages are located in the heart arteries.  AFTER THE PROCEDURE   If the procedure is done through the leg, you will be kept in bed lying flat for several hours. You will be instructed to not bend or cross your legs.  The insertion site will be checked frequently.    The pulse in your feet or wrist will be checked frequently.   Additional blood tests, X-rays, and an electrocardiogram may be done.  Document Released: 10/29/2002 Document Revised: 09/08/2013 Document Reviewed: 09/16/2012 [facility] Patient Information 2015 [place], [place]. This information is not intended to replace advice given to you by your health care provider. Make sure you discuss any questions you have with your health care provider.

## 2014-06-17 NOTE — Telephone Encounter (Signed)
Pt called in needing a call back regarding help with getting a letter to the red cross so her son who is a [place] [employer] will be notified of her health. A good call back number is [phone]

## 2014-06-17 NOTE — Assessment & Plan Note (Signed)
Continue present dose of Lasix. 

## 2014-06-17 NOTE — Assessment & Plan Note (Signed)
Patient counseled on discontinuing. 

## 2014-06-17 NOTE — Telephone Encounter (Signed)
Spoke with pt, she wanted to let me know the [employer] will be contacting me to get more information about her procedure.

## 2014-06-17 NOTE — Assessment & Plan Note (Signed)
No evidence on most recent echo. 

## 2014-06-17 NOTE — Telephone Encounter (Signed)
Cardiology [facility]  Called by the [facility] for clarification of the pt's procedure planned for 06/22/14 as to whether her son who is a [employer] would need to be present - to assess the severity of her situation.  They advised that the pt had released a waiver of confidentiality, allowing me to discuss her case with them.  I clarified that a cardiac catheterization is planned to clarify the results of a borderline nuclear stress test but that we would not be able to clarify her diagnosis/prognosis until we have the test results.  When asked whether her son should be brought home, I stated that the pt's condition is not critical as she is an outpatient.  Have forwarded this note to the pt's primary cardiologist Olga Millers.    Lance Morin, MD

## 2014-06-18 NOTE — Telephone Encounter (Signed)
Agree pts present condition not critical; will know further once cath results available Olga Millers

## 2014-06-18 NOTE — Telephone Encounter (Signed)
Spoke with pt, aware we have been in contact with the red cross and have answered their questions.

## 2014-06-20 ENCOUNTER — Telehealth: Payer: Self-pay | Admitting: Neurology

## 2014-06-20 MED ORDER — DIMETHYL FUMARATE 240 MG PO CPDR: 240.0000 mg | DELAYED_RELEASE_CAPSULE | Freq: Two times a day (BID) | ORAL | Status: DC

## 2014-06-20 NOTE — Telephone Encounter (Signed)
The patient called. She is going to run out of the tecfidera this weekend. I will send in another rx to [facility]. She will likely miss a few doses.

## 2014-06-22 ENCOUNTER — Encounter (HOSPITAL_COMMUNITY): Admission: RE | Payer: Self-pay | Source: Ambulatory Visit

## 2014-06-22 ENCOUNTER — Ambulatory Visit (HOSPITAL_COMMUNITY): Admission: RE | Admit: 2014-06-22 | Payer: Medicare Other | Source: Ambulatory Visit | Admitting: Cardiovascular Disease

## 2014-06-22 ENCOUNTER — Other Ambulatory Visit: Payer: Self-pay

## 2014-06-22 SURGERY — LEFT HEART CATHETERIZATION WITH CORONARY ANGIOGRAM
Anesthesia: LOCAL

## 2014-06-22 NOTE — Telephone Encounter (Signed)
I called the pharmacy today (Monday).  Spoke with Ennis Forts who was not able to assist me and asked that I call [phone].  I called this number and spoke with Bobbie.  She was also unable to assist me and asked that I call [phone].  I called this line and spoke with Shawn.  He verified they did get the Rx, and it is currently processing.  Says they will be contacting patient to schedule delivery, but said if the patient calls them before 12 noon, they can ensure delivery by tomorrow.  I called the patient.  Relayed this info. She will contact them.  Says she may want to change pharmacies, and will check into this.  She would also like a refill on Ritalin.  Request for this med has been forwarded to the provider for approval.

## 2014-06-23 ENCOUNTER — Other Ambulatory Visit: Payer: Self-pay | Admitting: Cardiology

## 2014-06-23 ENCOUNTER — Telehealth: Payer: Self-pay

## 2014-06-23 DIAGNOSIS — R0600 Dyspnea, unspecified: Secondary | ICD-10-CM

## 2014-06-23 MED ORDER — METHYLPHENIDATE HCL 20 MG PO TABS: 20.0000 mg | ORAL_TABLET | Freq: Every day | ORAL | Status: DC

## 2014-06-23 NOTE — Telephone Encounter (Signed)
Called patient and informed Rx ready for pick up at front desk. Patient verbalized understanding.  

## 2014-06-25 ENCOUNTER — Telehealth: Payer: Self-pay | Admitting: Gastroenterology

## 2014-06-29 ENCOUNTER — Encounter (HOSPITAL_COMMUNITY): Payer: Self-pay | Admitting: *Deleted

## 2014-06-29 ENCOUNTER — Encounter (HOSPITAL_COMMUNITY)
Admission: RE | Disposition: A | Discharge status: Home / Self Care | Payer: Medicare Other | Source: Ambulatory Visit | Attending: Cardiovascular Disease

## 2014-06-29 ENCOUNTER — Encounter: Payer: Medicare Other | Admitting: Gastroenterology

## 2014-06-29 ENCOUNTER — Ambulatory Visit (HOSPITAL_COMMUNITY)
Admission: RE | Admit: 2014-06-29 | Discharge: 2014-06-30 | Disposition: A | Discharge status: Home / Self Care | Payer: Medicare Other | Source: Ambulatory Visit | Attending: Cardiology | Admitting: Cardiology

## 2014-06-29 DIAGNOSIS — Z955 Presence of coronary angioplasty implant and graft: Secondary | ICD-10-CM

## 2014-06-29 DIAGNOSIS — Z7982 Long term (current) use of aspirin: Secondary | ICD-10-CM | POA: Insufficient documentation

## 2014-06-29 DIAGNOSIS — F419 Anxiety disorder, unspecified: Secondary | ICD-10-CM | POA: Insufficient documentation

## 2014-06-29 DIAGNOSIS — Z9861 Coronary angioplasty status: Secondary | ICD-10-CM

## 2014-06-29 DIAGNOSIS — K219 Gastro-esophageal reflux disease without esophagitis: Secondary | ICD-10-CM | POA: Diagnosis not present

## 2014-06-29 DIAGNOSIS — G35 Multiple sclerosis: Secondary | ICD-10-CM | POA: Diagnosis not present

## 2014-06-29 DIAGNOSIS — Z23 Encounter for immunization: Secondary | ICD-10-CM | POA: Insufficient documentation

## 2014-06-29 DIAGNOSIS — I25110 Atherosclerotic heart disease of native coronary artery with unstable angina pectoris: Secondary | ICD-10-CM | POA: Insufficient documentation

## 2014-06-29 DIAGNOSIS — I341 Nonrheumatic mitral (valve) prolapse: Secondary | ICD-10-CM | POA: Insufficient documentation

## 2014-06-29 DIAGNOSIS — F17210 Nicotine dependence, cigarettes, uncomplicated: Secondary | ICD-10-CM | POA: Insufficient documentation

## 2014-06-29 DIAGNOSIS — F329 Major depressive disorder, single episode, unspecified: Secondary | ICD-10-CM | POA: Insufficient documentation

## 2014-06-29 DIAGNOSIS — I2510 Atherosclerotic heart disease of native coronary artery without angina pectoris: Secondary | ICD-10-CM | POA: Diagnosis not present

## 2014-06-29 DIAGNOSIS — I209 Angina pectoris, unspecified: Secondary | ICD-10-CM | POA: Diagnosis present

## 2014-06-29 DIAGNOSIS — Z79899 Other long term (current) drug therapy: Secondary | ICD-10-CM | POA: Insufficient documentation

## 2014-06-29 DIAGNOSIS — R0600 Dyspnea, unspecified: Secondary | ICD-10-CM

## 2014-06-29 DIAGNOSIS — Z720 Tobacco use: Secondary | ICD-10-CM | POA: Diagnosis present

## 2014-06-29 DIAGNOSIS — I10 Essential (primary) hypertension: Secondary | ICD-10-CM | POA: Insufficient documentation

## 2014-06-29 DIAGNOSIS — R079 Chest pain, unspecified: Secondary | ICD-10-CM | POA: Diagnosis present

## 2014-06-29 DIAGNOSIS — R9431 Abnormal electrocardiogram [ECG] [EKG]: Secondary | ICD-10-CM | POA: Diagnosis present

## 2014-06-29 HISTORY — DX: Atherosclerotic heart disease of native coronary artery without angina pectoris: I25.10

## 2014-06-29 HISTORY — PX: LEFT HEART CATHETERIZATION WITH CORONARY ANGIOGRAM: SHX5451

## 2014-06-29 LAB — POCT ACTIVATED CLOTTING TIME: Activated Clotting Time: 583 seconds

## 2014-06-29 SURGERY — LEFT HEART CATHETERIZATION WITH CORONARY ANGIOGRAM

## 2014-06-29 MED ORDER — ASPIRIN 81 MG PO CHEW
81.0000 mg | CHEWABLE_TABLET | ORAL | Status: AC
Start: 2014-06-29 — End: 2014-06-29
  Administered 2014-06-29: 81 mg via ORAL

## 2014-06-29 MED ORDER — NITROGLYCERIN 1 MG/10 ML FOR IR/CATH LAB
INTRA_ARTERIAL | Status: AC
  Filled 2014-06-29: qty 10

## 2014-06-29 MED ORDER — SODIUM CHLORIDE 0.9 % IV SOLN
1.0000 mL/kg/h | INTRAVENOUS | Status: AC
Start: 2014-06-29 — End: 2014-06-29
  Administered 2014-06-29: 15:00:00 1 mL/kg/h via INTRAVENOUS

## 2014-06-29 MED ORDER — ADENOSINE 12 MG/4ML IV SOLN
16.0000 mL | Freq: Once | INTRAVENOUS | Status: DC
  Filled 2014-06-29: qty 16

## 2014-06-29 MED ORDER — TICAGRELOR 90 MG PO TABS
90.0000 mg | ORAL_TABLET | Freq: Two times a day (BID) | ORAL | Status: DC
  Administered 2014-06-30: 90 mg via ORAL
  Filled 2014-06-29 (×4): qty 1

## 2014-06-29 MED ORDER — HEPARIN SODIUM (PORCINE) 1000 UNIT/ML IJ SOLN
INTRAMUSCULAR | Status: AC
  Filled 2014-06-29: qty 1

## 2014-06-29 MED ORDER — TICAGRELOR 90 MG PO TABS
ORAL_TABLET | ORAL | Status: AC
  Filled 2014-06-29: qty 2

## 2014-06-29 MED ORDER — FENTANYL CITRATE 0.05 MG/ML IJ SOLN
INTRAMUSCULAR | Status: AC
  Filled 2014-06-29: qty 2

## 2014-06-29 MED ORDER — ACETAMINOPHEN 325 MG PO TABS: 650.0000 mg | ORAL_TABLET | ORAL | Status: DC | PRN

## 2014-06-29 MED ORDER — LIDOCAINE HCL (PF) 1 % IJ SOLN
INTRAMUSCULAR | Status: AC
  Filled 2014-06-29: qty 30

## 2014-06-29 MED ORDER — METOPROLOL SUCCINATE ER 50 MG PO TB24
50.0000 mg | ORAL_TABLET | Freq: Every day | ORAL | Status: DC
  Administered 2014-06-29: 22:00:00 50 mg via ORAL
  Filled 2014-06-29 (×2): qty 1

## 2014-06-29 MED ORDER — DULOXETINE HCL 60 MG PO CPEP
60.0000 mg | ORAL_CAPSULE | Freq: Every day | ORAL | Status: DC
  Administered 2014-06-30: 11:00:00 60 mg via ORAL
  Filled 2014-06-29 (×2): qty 1

## 2014-06-29 MED ORDER — SODIUM CHLORIDE 0.9 % IJ SOLN: 3.0000 mL | Freq: Two times a day (BID) | INTRAMUSCULAR | Status: DC

## 2014-06-29 MED ORDER — MAGNESIUM HYDROXIDE 400 MG/5ML PO SUSP: 30.0000 mL | Freq: Every day | ORAL | Status: DC | PRN

## 2014-06-29 MED ORDER — VERAPAMIL HCL 2.5 MG/ML IV SOLN
INTRAVENOUS | Status: AC
Start: 2014-06-29 — End: 2014-06-29
  Filled 2014-06-29: qty 2

## 2014-06-29 MED ORDER — MIDAZOLAM HCL 2 MG/2ML IJ SOLN
INTRAMUSCULAR | Status: AC
  Filled 2014-06-29: qty 2

## 2014-06-29 MED ORDER — METHYLPHENIDATE HCL 5 MG PO TABS
20.0000 mg | ORAL_TABLET | Freq: Every day | ORAL | Status: DC
Start: 2014-06-29 — End: 2014-06-30
  Administered 2014-06-30: 11:00:00 20 mg via ORAL
  Filled 2014-06-29: qty 4

## 2014-06-29 MED ORDER — LOSARTAN POTASSIUM 50 MG PO TABS
100.0000 mg | ORAL_TABLET | Freq: Every day | ORAL | Status: DC
  Administered 2014-06-30: 100 mg via ORAL
  Filled 2014-06-29 (×2): qty 2

## 2014-06-29 MED ORDER — ASPIRIN 81 MG PO CHEW
81.0000 mg | CHEWABLE_TABLET | Freq: Every day | ORAL | Status: DC
  Administered 2014-06-30: 11:00:00 81 mg via ORAL
  Filled 2014-06-29: qty 1

## 2014-06-29 MED ORDER — SODIUM CHLORIDE 0.9 % IJ SOLN: 3.0000 mL | INTRAMUSCULAR | Status: DC | PRN

## 2014-06-29 MED ORDER — HEPARIN (PORCINE) IN NACL 2-0.9 UNIT/ML-% IJ SOLN
INTRAMUSCULAR | Status: AC
  Filled 2014-06-29: qty 1000

## 2014-06-29 MED ORDER — MODAFINIL 100 MG PO TABS: 200.0000 mg | ORAL_TABLET | Freq: Every day | ORAL | Status: DC

## 2014-06-29 MED ORDER — PANTOPRAZOLE SODIUM 40 MG PO TBEC
40.0000 mg | DELAYED_RELEASE_TABLET | Freq: Every day | ORAL | Status: DC
  Administered 2014-06-30: 11:00:00 40 mg via ORAL
  Filled 2014-06-29 (×2): qty 1

## 2014-06-29 MED ORDER — HEPARIN (PORCINE) IN NACL 2-0.9 UNIT/ML-% IJ SOLN
INTRAMUSCULAR | Status: AC
  Filled 2014-06-29: qty 500

## 2014-06-29 MED ORDER — DIMETHYL FUMARATE 240 MG PO CPDR: 240.0000 mg | DELAYED_RELEASE_CAPSULE | Freq: Two times a day (BID) | ORAL | Status: DC

## 2014-06-29 MED ORDER — SODIUM CHLORIDE 0.9 % IV SOLN
1.0000 mL/kg/h | INTRAVENOUS | Status: DC
  Administered 2014-06-29: 1 mL/kg/h via INTRAVENOUS

## 2014-06-29 MED ORDER — BIVALIRUDIN 250 MG IV SOLR
INTRAVENOUS | Status: AC
  Filled 2014-06-29: qty 250

## 2014-06-29 MED ORDER — ONDANSETRON HCL 4 MG/2ML IJ SOLN: 4.0000 mg | Freq: Four times a day (QID) | INTRAMUSCULAR | Status: DC | PRN

## 2014-06-29 MED ORDER — FLUTICASONE PROPIONATE 50 MCG/ACT NA SUSP
1.0000 | Freq: Every day | NASAL | Status: DC
  Administered 2014-06-29 – 2014-06-30 (×2): 1 via NASAL
  Filled 2014-06-29: qty 16

## 2014-06-29 MED ORDER — SODIUM CHLORIDE 0.9 % IV SOLN: 250.0000 mL | INTRAVENOUS | Status: DC | PRN

## 2014-06-29 MED ORDER — ASPIRIN 81 MG PO CHEW
CHEWABLE_TABLET | ORAL | Status: AC
  Filled 2014-06-29: qty 1

## 2014-06-29 MED ORDER — OXYCODONE-ACETAMINOPHEN 5-325 MG PO TABS: 2.0000 | ORAL_TABLET | ORAL | Status: DC | PRN

## 2014-06-29 NOTE — CV Procedure (Signed)
    Cardiac Catheterization Procedure Note  Name: DEKLYN LIKELY MRN: [id number] DOB: 18-Aug-1960  Procedure: Left Heart Cath, Selective Coronary Angiography, LV angiography, PTCA and stenting of the mid LAD, FFR and stenting of the mid RCA  Indication: 54 yo WF with history of dyspnea on exertion and chest pain. Myoview study demonstrates apical ischemia.   Procedural Details:  The right wrist was prepped, draped, and anesthetized with 1% lidocaine. Using the modified Seldinger technique, a 6 French slender sheath was introduced into the right radial artery. 3 mg of verapamil was administered through the sheath, weight-based unfractionated heparin was administered intravenously. Standard Judkins catheters were used for selective coronary angiography and left ventriculography. Catheter exchanges were performed over an exchange length guidewire.  PROCEDURAL FINDINGS Hemodynamics: AO 135/70 mean 97 mm Hg LV 142/14 mm Hg   Coronary angiography: Coronary dominance: right  Left mainstem: Normal.  Left anterior descending (LAD): There is a long 80% stenosis in the mid vessel. The first diagonal is without significant disease.   Left circumflex (LCx): Normal.   Right coronary artery (RCA): The RCA has a segmental 60-70% stenosis in the mid vessel.   Left ventriculography: Left ventricular systolic function is normal, LVEF is estimated at 55-65%, there is no significant mitral regurgitation   PCI Note:  Following the diagnostic procedure, the decision was made to proceed with PCI of the LAD.  Weight-based bivalirudin was given for anticoagulation. Brilinta 180 mg was given orally. Once a therapeutic ACT was achieved, a 6 [place] XBLAD 3.5 guide catheter was inserted.  A prowater coronary guidewire was used to cross the lesion.  The lesion was predilated with a 2.5 mm balloon.  The lesion was then stented with a 2.7 x 28 mm Promus stent.  The stent was postdilated with a 3.0 mm noncompliant  balloon.  Following PCI, there was 0% residual stenosis and TIMI-3 flow. Final angiography confirmed an excellent result.  We next evaluated the RCA lesion. A 6 Fr FR4 guide was used. The lesion was crossed with a prowater wire. An Assist FFR assessment was performed. Maximal hyperemia was obtained with IV Adenosine infusion. Resting FFR was 0.96 and dropped to 0.80 with Adenosine. This indicated the lesion was hemodynamically significant. the lesion was pre-dilated with a 2.5 mm balloon. The lesion was then stented with a 3.0 x 24 mm Promus stent. The stent was post dilated with a 3.25 mm noncompliant balloon.Following PCI, there was 0% residual stenosis and TIMI-3 flow. Final angiography confirmed an excellent result.  The patient tolerated the procedure well. There were no immediate procedural complications. A TR band was used for radial hemostasis. The patient was transferred to the post catheterization recovery area for further monitoring.  PCI Data: Vessel #1- LAD/Segment - mid Percent Stenosis (pre)  80% TIMI-flow 3 Stent 2.75 x 28 mm Promus Percent Stenosis (post) 0% TIMI-flow (post) 3  Vessel #2- RCA/mid Percent stenosis (pre)- 60-70%, FFR of 0.80 TIMI flow 3 Stent 3.0 x 24 mm Promus Percent stenosis (post)- 0% TIMI flow (post) 3  Final Conclusions:   1. 2 vessel obstructive CAD 2. Normal LV function.   Recommendations:  DAPT for one year. Risk factor modification.  Gwenn Teodoro [place], MDFACC 06/29/2014, 2:18 PM

## 2014-06-29 NOTE — Interval H&P Note (Signed)
History and Physical Interval Note:  06/29/2014 12:49 PM  Summer Henderson  has presented today for surgery, with the diagnosis of abnormal stress test  The various methods of treatment have been discussed with the patient and family. After consideration of risks, benefits and other options for treatment, the patient has consented to  Procedure(s): LEFT HEART CATHETERIZATION WITH CORONARY ANGIOGRAM (N/A) as a surgical intervention .  The patient's history has been reviewed, patient examined, no change in status, stable for surgery.  I have reviewed the patient's chart and labs.  Questions were answered to the patient's satisfaction.   Cath Lab Visit (complete for each Cath Lab visit)  Clinical Evaluation Leading to the Procedure:   ACS: No.  Non-ACS:    Anginal Classification: CCS II  Anti-ischemic medical therapy: Minimal Therapy (1 class of medications)  Non-Invasive Test Results: Low-risk stress test findings: cardiac mortality <1%/year  Prior CABG: No previous CABG        Theron Arista [facility] 06/29/2014 12:49 PM

## 2014-06-29 NOTE — Progress Notes (Signed)
TR BAND REMOVAL  LOCATION:    right radial  DEFLATED PER PROTOCOL:    Yes.    TIME BAND OFF / DRESSING APPLIED:    1945   SITE UPON ARRIVAL:    Level 0  SITE AFTER BAND REMOVAL:    Level 0  REVERSE ALLEN'S TEST:     negative  CIRCULATION SENSATION AND MOVEMENT:    Within Normal Limits   Yes.    COMMENTS:   Band removed. No complications. Reviewed post band removal instructions.

## 2014-06-29 NOTE — H&P (View-Only) (Signed)
    HPI: FU hypertension, prolonged QT and mitral valve prolapse. Previously followed in [place]. Holter monitor in September of 2012 showed sinus rhythm with rare PVC. Echocardiogram in June of 2015 showed normal LV function and mild left atrial enlargement. There was trace mitral regurgitation. Patient had an abdominal CT for abdominal pain in December 2015. 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 Nuclear study January 2016 showed an ejection fraction of 56%, probable mild breast attenuation with possible mild mid anteroseptal ischemia. Since she was last seen, she notes dyspnea on exertion but no orthopnea or PND. She has had continuous chest pain on the right since previous nuclear study.   Current Outpatient Prescriptions  Medication Sig Dispense Refill  . aspirin 81 MG tablet Take 81 mg by mouth daily.    . clonazePAM (KLONOPIN) 0.25 MG disintegrating tablet Take 1 tablet (0.25 mg total) by mouth 2 (two) times daily as needed. 60 tablet 1  . cyclobenzaprine (FLEXERIL) 10 MG tablet Take 1 tablet (10 mg total) by mouth 2 (two) times daily as needed for muscle spasms. 20 tablet 0  . docusate sodium (COLACE) 250 MG capsule Take 1 capsule (250 mg total) by mouth daily. 10 capsule 0  . DULoxetine (CYMBALTA) 60 MG capsule Take 1 capsule (60 mg total) by mouth 2 (two) times daily. (Patient taking differently: Take 60 mg by mouth daily. ) 180 capsule 3  . ENABLEX 7.5 MG 24 hr tablet Take 7.5 mg by mouth daily.    . furosemide (LASIX) 20 MG tablet Take 1 tablet (20 mg total) by mouth daily. ONE TABLET ONCE DAILY X 3 DAYS THEN AS NEEDED FOR SWELLING OR SHORTNESS OF BREATH 90 tablet 3  . gabapentin (NEURONTIN) 100 MG capsule Take 100 mg by mouth as needed (for pain).     . gabapentin (NEURONTIN) 300 MG capsule Take 300 mg by mouth as needed (for pain).     . hydrocortisone (ANUSOL-HC) 25 MG suppository Place 1 suppository (25 mg total) rectally  every 12 (twelve) hours. 12 suppository 0  . losartan (COZAAR) 100 MG tablet Take 1 tablet (100 mg total) by mouth daily. 90 tablet 4  . methylphenidate (RITALIN) 20 MG tablet Take 1 tablet (20 mg total) by mouth daily. At noon 30 tablet 0  . metoprolol succinate (TOPROL XL) 25 MG 24 hr tablet Take 1 tablet (25 mg total) by mouth daily. 90 tablet 3  . modafinil (PROVIGIL) 200 MG tablet TAKE 1 TABLET EVERY DAY (Patient taking differently: TAKE 1 TABLET EVERY DAY AS NEEDED) 30 tablet 5  . NASONEX 50 MCG/ACT nasal spray Place 50 sprays into the nose daily.    . NEXIUM 40 MG capsule Take 40 mg by mouth daily.    . oxyCODONE-acetaminophen (PERCOCET/ROXICET) 5-325 MG per tablet Take 2 tablets by mouth every 4 (four) hours as needed for severe pain. 20 tablet 0  . polyethylene glycol (MIRALAX) packet Take 17 g by mouth daily. 14 each 0  . potassium chloride SA (K-DUR,KLOR-CON) 20 MEQ tablet 2 tablets now and 2 tablets in 4 hours then one tablet every time you take furosemide 90 tablet 3  . MOVIPREP 100 G SOLR Take 1 kit (200 g total) by mouth once. (Patient not taking: Reported on 06/17/2014) 1 kit 0   No current facility-administered medications for this visit.     Past Medical History  Diagnosis Date  . Multiple sclerosis   . Chronic fatigue   .   Cervical dysplasia   . GERD (gastroesophageal reflux disease)   . Carpal tunnel syndrome   . Prolonged QT interval   . Hypertension   . Mitral valve prolapse   . Anxiety   . Arthritis   . Depression   . Pneumonia     Past Surgical History  Procedure Laterality Date  . Rectovaginal fistula repair    . Abdominal hysterectomy    . Dilation and curettage of uterus    . Foot surgery Bilateral     For bunions  . Carpal tunnel release    . Bladder resuspension    . Hammer toe surgery Left   . Cervical spine surgery  10-16-2009  . Orif toe fracture Right   . Biceps tendon repair    . Prolong qtc      Heart    History   Social History  .  Marital Status: Divorced    Spouse Name: N/A  . Number of Children: 2  . Years of Education: 13   Occupational History  . Disabled     Disability   Social History Main Topics  . Smoking status: Current Every Day Smoker -- 1.00 packs/day for 35 years    Types: Cigarettes  . Smokeless tobacco: Never Used  . Alcohol Use: No  . Drug Use: No  . Sexual Activity: Not on file   Other Topics Concern  . Not on file   Social History Narrative    ROS: no fevers or chills, productive cough, hemoptysis, dysphasia, odynophagia, melena, hematochezia, dysuria, hematuria, rash, seizure activity, orthopnea, PND, pedal edema, claudication. Remaining systems are negative.  Physical Exam: Well-developed well-nourished in no acute distress.  Skin is warm and dry.  HEENT is normal.  Neck is supple.  Chest is clear to auscultation with normal expansion.  Cardiovascular exam is regular rate and rhythm.  Abdominal exam nontender or distended. No masses palpated. Extremities show no edema. neuro grossly intact      

## 2014-06-29 NOTE — Interval H&P Note (Signed)
History and Physical Interval Note:  06/29/2014 9:26 AM  Summer Henderson  has presented today for surgery, with the diagnosis of abnormal stress test  The various methods of treatment have been discussed with the patient and family. After consideration of risks, benefits and other options for treatment, the patient has consented to  Procedure(s): LEFT HEART CATHETERIZATION WITH CORONARY ANGIOGRAM (N/A) as a surgical intervention .  The patient's history has been reviewed, patient examined, no change in status, stable for surgery.  I have reviewed the patient's chart and labs.  Questions were answered to the patient's satisfaction.    Cath Lab Visit (complete for each Cath Lab visit)  Clinical Evaluation Leading to the Procedure:   ACS: No.  Non-ACS:    Anginal Classification: CCS II  Anti-ischemic medical therapy: Minimal Therapy (1 class of medications)  Non-Invasive Test Results: Low-risk stress test findings: cardiac mortality <1%/year  Prior CABG: No previous CABG        Tonny Bollman

## 2014-06-30 ENCOUNTER — Encounter (HOSPITAL_COMMUNITY): Payer: Self-pay | Admitting: Nurse Practitioner

## 2014-06-30 DIAGNOSIS — I2510 Atherosclerotic heart disease of native coronary artery without angina pectoris: Secondary | ICD-10-CM

## 2014-06-30 DIAGNOSIS — I25119 Atherosclerotic heart disease of native coronary artery with unspecified angina pectoris: Secondary | ICD-10-CM

## 2014-06-30 DIAGNOSIS — K219 Gastro-esophageal reflux disease without esophagitis: Secondary | ICD-10-CM | POA: Diagnosis not present

## 2014-06-30 DIAGNOSIS — G35 Multiple sclerosis: Secondary | ICD-10-CM | POA: Diagnosis not present

## 2014-06-30 DIAGNOSIS — I10 Essential (primary) hypertension: Secondary | ICD-10-CM | POA: Diagnosis not present

## 2014-06-30 DIAGNOSIS — I209 Angina pectoris, unspecified: Secondary | ICD-10-CM

## 2014-06-30 DIAGNOSIS — I25110 Atherosclerotic heart disease of native coronary artery with unstable angina pectoris: Secondary | ICD-10-CM | POA: Diagnosis not present

## 2014-06-30 DIAGNOSIS — Z9861 Coronary angioplasty status: Secondary | ICD-10-CM

## 2014-06-30 LAB — BASIC METABOLIC PANEL
Anion gap: 4 — ABNORMAL LOW (ref 5–15)
BUN: 6 mg/dL (ref 6–23)
CO2: 28 mmol/L (ref 19–32)
Calcium: 8.9 mg/dL (ref 8.4–10.5)
Chloride: 111 mmol/L (ref 96–112)
Creatinine, Ser: 0.77 mg/dL (ref 0.50–1.10)
GFR calc Af Amer: >90 mL/min (ref >90)
GLUCOSE: 108 mg/dL — AB (ref 70–99)
POTASSIUM: 3.8 mmol/L (ref 3.5–5.1)
Sodium: 143 mmol/L (ref 135–145)

## 2014-06-30 LAB — CBC
HCT: 35.1 % — ABNORMAL LOW (ref 36.0–46.0)
HEMOGLOBIN: 11.7 g/dL — AB (ref 12.0–15.0)
MCH: 26.8 pg (ref 26.0–34.0)
MCHC: 33.3 g/dL (ref 30.0–36.0)
MCV: 80.3 fL (ref 78.0–100.0)
Platelets: 377 10*3/uL (ref 150–400)
RBC: 4.37 MIL/uL (ref 3.87–5.11)
RDW: 14.9 % (ref 11.5–15.5)
WBC: 7.3 10*3/uL (ref 4.0–10.5)

## 2014-06-30 MED ORDER — PNEUMOCOCCAL VAC POLYVALENT 25 MCG/0.5ML IJ INJ
0.5000 mL | INJECTION | INTRAMUSCULAR | Status: AC
  Administered 2014-06-30: 11:00:00 0.5 mL via INTRAMUSCULAR
  Filled 2014-06-30: qty 0.5

## 2014-06-30 MED ORDER — POLYETHYLENE GLYCOL 3350 17 G PO PACK
17.0000 g | PACK | Freq: Every day | ORAL | Status: DC
  Administered 2014-06-30: 17 g via ORAL
  Filled 2014-06-30: qty 1

## 2014-06-30 MED ORDER — ASPIRIN 81 MG PO TABS: 162.0000 mg | ORAL_TABLET | Freq: Every day | ORAL | Status: DC

## 2014-06-30 MED ORDER — TICAGRELOR 90 MG PO TABS: 90.0000 mg | ORAL_TABLET | Freq: Two times a day (BID) | ORAL | Status: DC

## 2014-06-30 MED ORDER — CLOPIDOGREL BISULFATE 75 MG PO TABS: 75.0000 mg | ORAL_TABLET | Freq: Every day | ORAL | Status: DC

## 2014-06-30 MED ORDER — ATORVASTATIN CALCIUM 40 MG PO TABS
40.0000 mg | ORAL_TABLET | Freq: Every day | ORAL | Status: DC
  Filled 2014-06-30: qty 1

## 2014-06-30 MED ORDER — ASPIRIN 81 MG PO TABS: 81.0000 mg | ORAL_TABLET | Freq: Every day | ORAL | Status: DC

## 2014-06-30 MED ORDER — PANTOPRAZOLE SODIUM 40 MG PO TBEC: 40.0000 mg | DELAYED_RELEASE_TABLET | Freq: Every day | ORAL | Status: DC

## 2014-06-30 MED ORDER — PNEUMOCOCCAL VAC POLYVALENT 25 MCG/0.5ML IJ INJ
0.5000 mL | INJECTION | INTRAMUSCULAR | Status: DC
Start: 2014-07-01 — End: 2014-06-30

## 2014-06-30 MED ORDER — ATORVASTATIN CALCIUM 40 MG PO TABS: 40.0000 mg | ORAL_TABLET | Freq: Every day | ORAL | Status: DC

## 2014-06-30 MED ORDER — CLOPIDOGREL BISULFATE 75 MG PO TABS
300.0000 mg | ORAL_TABLET | Freq: Once | ORAL | Status: AC
  Administered 2014-06-30: 300 mg via ORAL
  Filled 2014-06-30: qty 4

## 2014-06-30 NOTE — Discharge Instructions (Signed)

## 2014-06-30 NOTE — Progress Notes (Signed)
Patient Name: Summer Henderson Date of Encounter: 06/30/2014   Principal Problem:   Angina pectoris Active Problems:   Tobacco abuse   Prolonged Q-T interval on ECG   CAD (coronary artery disease)   Hypertension    SUBJECTIVE  No chest pain or sob.  No right wrist complaints.  CURRENT MEDS . aspirin  81 mg Oral Daily  . Dimethyl Fumarate  240 mg Oral BID  . DULoxetine  60 mg Oral Daily  . fluticasone  1 spray Each Nare Daily  . losartan  100 mg Oral Daily  . methylphenidate  20 mg Oral Daily  . metoprolol succinate  50 mg Oral Daily  . modafinil  200 mg Oral Daily  . pantoprazole  40 mg Oral Daily  . pneumococcal 23 valent vaccine  0.5 mL Intramuscular Tomorrow-1000  . ticagrelor  90 mg Oral BID    OBJECTIVE  Filed Vitals:   06/29/14 2115 06/29/14 2130 06/30/14 0034 06/30/14 0611  BP: 148/93  92/60 157/77  Pulse: 70 79 76 63  Temp: 97.5 F (36.4 C)  97.8 F (36.6 C) 98.1 F (36.7 C)  TempSrc: Oral  Oral Oral  Resp: 16  16 20   Height:      Weight:   169 lb 12.1 oz (77 kg)   SpO2: 98%  96% 96%    Intake/Output Summary (Last 24 hours) at 06/30/14 0654 Last data filed at 06/30/14 0229  Gross per 24 hour  Intake 1319.3 ml  Output   2050 ml  Net -730.7 ml   Filed Weights   06/29/14 0846 06/30/14 0034  Weight: 172 lb (78.019 kg) 169 lb 12.1 oz (77 kg)    PHYSICAL EXAM  General: Pleasant, NAD. Neuro: Alert and oriented X 3. Moves all extremities spontaneously. Psych: Normal affect. HEENT:  Normal  Neck: Supple without bruits or JVD. Lungs:  Resp regular and unlabored, CTA. Heart: RRR no s3, s4, or murmurs. Abdomen: Soft, non-tender, non-distended, BS + x 4.  Extremities: No clubbing, cyanosis or edema. DP/PT/Radials 2+ and equal bilaterally.  R wrist w/o bleeding/bruit/hematoma.  Accessory Clinical Findings  CBC  Recent Labs  06/30/14 0423  WBC 7.3  HGB 11.7*  HCT 35.1*  MCV 80.3  PLT 377   Basic Metabolic Panel No results for input(s): NA,  K, CL, CO2, GLUCOSE, BUN, CREATININE, CALCIUM, MG, PHOS in the last 72 hours.  TELE  rsr  ECG  Rsr, 61, poor r progression.  Radiology/Studies  Dg Chest 2 View  06/17/2014   CLINICAL DATA:  Pre operative respiratory exam. Coronary artery disease.  EXAM: CHEST  2 VIEW  COMPARISON:  12/12/2013 and 10/05/2009  FINDINGS: Heart size and pulmonary vascularity are normal. Focal area of scarring in the lingula, unchanged. No infiltrates or effusions. No acute osseous abnormality. Slight thoracic scoliosis.  IMPRESSION: No active cardiopulmonary disease.   Electronically Signed   By: Francene Boyers M.D.   On: 06/17/2014 15:04    ASSESSMENT AND PLAN  1.  USA/CAD:  S/p PCI/DES to the RCA and LAD yesterday.  No chest pain overnight.  Cardiac rehab to see this AM and ambulate.  Cont asa, bb, brilinta.  Add statin.  Will need f/u lipids/lft's.  Plan d/c this AM.  2.  HTN:  BP somewhat variable.  Cont bb/arb.  F/U after AM meds.  3.  Lipids:  Status unknown.  Add statin in setting of CAD.  Will need f/u lipids/lft's in 6-8 wks.  4.  Tob Abuse:  Complete cessation advised.  She says that she has quit.  Signed, Summer Ducking NP  I have examined the patient and reviewed assessment and plan and discussed with patient.  Agree with above as stated.  Due to the fact that she requires more than 100 mg of aspirin daily due to her multiple sclerosis medicines, she would not be a good candidate for Brilinta. She was given a loading dose of clopidogrel. She will start 75 mg daily tomorrow. This way, she can take 2 baby aspirins daily which she requires for her other medications. She would benefit from cardiac rehabilitation. Right wrist mildly sore but no hematoma. We'll plan on discharging the patient.  Summer Henderson S.

## 2014-06-30 NOTE — Progress Notes (Signed)
CARDIAC REHAB PHASE I   PRE:  Rate/Rhythm:  80 SR  BP:  Supine:   Sitting: 154/85  Standing:    SaO2:   MODE:  Ambulation: 1000 ft   POST:  Rate/Rhythm: 87 SR  BP:  Supine:   Sitting: 158/88  Standing:    SaO2:  0745-0905 Pt walked 1000 ft with fast pace, a little wobbly at times. Encouraged pt to slow down as she said sometimes she falls at home due to MS. Pt is under a lot of stress with dealing with MS and has son at home with cerebral palsy. Emotional support given. Reviewed stent/antiplatelet med, NTG use, smoking cessation, ex ed and diet. Gave pt smoking cessation handout and fake cigarette. Discussed CRP 2 and pt is considering. Will refer to [facility]. Pt stated she is going to try to quit cold [place]. Receptive to ed.    Summer Nutting, RN BSN  06/30/2014 9:00 AM

## 2014-06-30 NOTE — Discharge Summary (Signed)
Discharge Summary   Patient ID: Summer Henderson,  MRN: [id number], DOB/AGE: 12/23/1960 54 y.o.  Admit date: 06/29/2014 Discharge date: 06/30/2014  Primary Care Provider: [facility] Primary Cardiologist: B. Crenshaw, MD   Discharge Diagnoses Principal Problem:   Angina pectoris  **S/P PCI/DES of the LAD and RCA this admission.  Active Problems:   Tobacco abuse   Prolonged Q-T interval on ECG   CAD (coronary artery disease)   Hypertension   Multiple sclerosis  Allergies Allergies  Allergen Reactions  . Demerol [Meperidine]     Other reaction(s): Other (See Comments) [derm] rash, swelling, itching  . Erythromycin   . Morphine And Related   . Sulfa Antibiotics   . Diflucan [Fluconazole]   . Other     Other reaction(s): Other (See Comments) Z-pak causes EKG changes, was told by cardiologist not to take this.  . Tape     Adhesive tape  . Toviaz [Fesoterodine Fumarate Er] Palpitations   Procedures  Cardiac Catheterization and Percutaneous Coronary Intervention 2.22.2016  PROCEDURAL FINDINGS Hemodynamics: AO 135/70 mean 97 mm Hg LV 142/14 mm Hg              Coronary angiography: Coronary dominance: right  Left mainstem: Normal. Left anterior descending (LAD): There is a long 80% stenosis in the mid vessel. The first diagonal is without significant disease.     **The LAD was successful stent using a 2.75 x 28 mm Promus drug eluting stent.**  Left circumflex (LCx): Normal.    Right coronary artery (RCA): The RCA has a segmental 60-70% stenosis in the mid vessel.     **Fractional flow reserve was performed within the right coronary artery and was abnormal at 0.80. This area was subsequently stented using a 3.0 x 24 mm Promus drug eluting stent.**  Left ventriculography: Left ventricular systolic function is normal, LVEF is estimated at 55-65%, there is no significant mitral regurgitation  _____________   History of Present Illness  54 year old female with  a prior history of hypertension, prolonged QT interval, and mitral valve prolapse. She has been experiencing dyspnea on exertion and underwent stress testing in January 2016 revealing probable mild breast attenuation with possible mild mid anteroseptal ischemia. She was seen back in clinic on February 9 and recommendation was made for diagnostic catheterization.  Hospital Course  Patient presented to the [facility] cardiac catheterization laboratory on February 22, revealing severe mid LAD stenosis and also moderate mid right coronary artery stenosis. The LAD was successfully stented using a 2.75 x 28 mm Promus drug-eluting stent. Fractional flow reserve was performed with in the right coronary artery and following administration of intracoronary adenosine, FFR dropped from 0.96-0.80. As result, this area was successfully stented using a 3.0 x 24 mm Promus drug eluting stent. Patient tolerated procedure well and postprocedure had no further chest pain or dyspnea. She had initially been loaded with brilinta however she is indicated to [place] this morning that she requires 162 mg of aspirin daily related to her diagnosis of multiple sclerosis. As a result, we have discontinued brilinta and loaded her with Plavix this morning. She'll be discharged home on Plavix 75 mg daily. We have had to switch her proton pump inhibitor from Nexium to Protonix in the setting of Plavix therapy. Finally, statin therapy was added in the setting of CAD. We have arranged for follow-up in approximately 2 weeks.  Discharge Vitals Blood pressure 146/74, pulse 59, temperature 98.1 F (36.7 C), temperature source Oral, resp. rate 18, height 5' (1.524  m), weight 169 lb 12.1 oz (77 kg), SpO2 96 %.  Filed Weights   06/29/14 0846 06/30/14 0034  Weight: 172 lb (78.019 kg) 169 lb 12.1 oz (77 kg)    Labs  CBC  Recent Labs  06/30/14 0423  WBC 7.3  HGB 11.7*  HCT 35.1*  MCV 80.3  PLT 377   Basic Metabolic Panel  Recent Labs   09/81/19 0423  NA 143  K 3.8  CL 111  CO2 28  GLUCOSE 108*  BUN 6  CREATININE 0.77  CALCIUM 8.9   Disposition  Pt is being discharged home today in good condition.  Follow-up Plans & Appointments      Follow-up Information    Follow up with Abelino Derrick, PA-C On 07/16/2014.   Specialty:  Cardiology   Why:  8:30 AM - Dr. Ludwig Clarks PA   Contact information:   [address] [place] [address] [phone]       Discharge Medications    Medication List    STOP taking these medications        cyclobenzaprine 10 MG tablet  Commonly known as:  FLEXERIL     NEXIUM 40 MG capsule  Generic drug:  esomeprazole  Replaced by:  pantoprazole 40 MG tablet      TAKE these medications        aspirin 81 MG tablet  Take 2 tablets (162 mg total) by mouth daily.     atorvastatin 40 MG tablet  Commonly known as:  LIPITOR  Take 1 tablet (40 mg total) by mouth daily at 6 PM.     clonazePAM 0.25 MG disintegrating tablet  Commonly known as:  KLONOPIN  Take 1 tablet (0.25 mg total) by mouth 2 (two) times daily as needed.     clopidogrel 75 MG tablet  Commonly known as:  PLAVIX  Take 1 tablet (75 mg total) by mouth daily.  Start taking on:  07/01/2014     Dimethyl Fumarate 240 MG Cpdr  Commonly known as:  TECFIDERA  Take 1 capsule (240 mg total) by mouth 2 (two) times daily.     docusate sodium 250 MG capsule  Commonly known as:  COLACE  Take 1 capsule (250 mg total) by mouth daily.     DULoxetine 60 MG capsule  Commonly known as:  CYMBALTA  Take 1 capsule (60 mg total) by mouth 2 (two) times daily.     gabapentin 300 MG capsule  Commonly known as:  NEURONTIN  Take 300 mg by mouth as needed (for pain).     hydrocortisone 25 MG suppository  Commonly known as:  ANUSOL-HC  Place 1 suppository (25 mg total) rectally every 12 (twelve) hours.     losartan 100 MG tablet  Commonly known as:  COZAAR  Take 1 tablet (100 mg total) by mouth daily.      methylphenidate 20 MG tablet  Commonly known as:  RITALIN  Take 1 tablet (20 mg total) by mouth daily. At noon     metoprolol succinate 50 MG 24 hr tablet  Commonly known as:  TOPROL XL  Take 1 tablet (50 mg total) by mouth daily.     NASONEX 50 MCG/ACT nasal spray  Generic drug:  mometasone  Place 50 sprays into the nose daily.     oxyCODONE-acetaminophen 5-325 MG per tablet  Commonly known as:  PERCOCET/ROXICET  Take 2 tablets by mouth every 4 (four) hours as needed for severe pain.  pantoprazole 40 MG tablet  Commonly known as:  PROTONIX  Take 1 tablet (40 mg total) by mouth daily.        Outstanding Labs/Studies  Follow-up lipids and LFTs in 6-8 weeks.  Duration of Discharge Encounter   Greater than 30 minutes including physician time.  Signed, Nicolasa Ducking NP 06/30/2014, 10:10 AM   I have examined the patient and reviewed assessment and plan and discussed with patient.  Agree with above as stated.  Due to the fact that she requires more than 100 mg of aspirin daily due to her multiple sclerosis medicines, she would not be a good candidate for Brilinta. She was given a loading dose of clopidogrel. She will start 75 mg daily tomorrow. This way, she can take 2 baby aspirins daily which she requires for her other medications. She would benefit from cardiac rehabilitation. Right wrist mildly sore but no hematoma. We'll plan on discharging the patient.  Makailah Slavick S.

## 2014-06-30 NOTE — Care Management Note (Signed)
    Page 1 of 1   06/30/2014     10:52:53 AM CARE MANAGEMENT NOTE 06/30/2014  Patient:  Summer Henderson, Summer Henderson   Account Number:  [id number]  Date Initiated:  06/30/2014  Documentation initiated by:  Akera Snowberger  Subjective/Objective Assessment:   Pt s/p PTCA on 06/29/14.     Action/Plan:   Anticipated DC Date:  06/30/2014   Anticipated DC Plan:  [place]  CM consult      Choice offered to / List presented to:             Status of service:  Completed, signed off Medicare Important Message given?  NA - LOS <3 / Initial given by admissions (If response is "NO", the following Medicare IM given date fields will be blank) Date Medicare IM given:   Medicare IM given by:   Date Additional Medicare IM given:   Additional Medicare IM given by:    Discharge Disposition:  HOME/SELF CARE  Per UR Regulation:  Reviewed for med. necessity/level of care/duration of stay  If discussed at [place] of Stay Meetings, dates discussed:    Comments:  06/30/14 Ellan Lambert, RN,  BSN [phone] CM c/s for Brilinta...met with pt, she states MD has changed her to Plavix due to issue with her MS meds. Checked MAR; Brilinta has been discontinued, and pt started on Plavix.  Will sign off.

## 2014-07-01 MED FILL — Sodium Chloride IV Soln 0.9%: INTRAVENOUS | Qty: 50 | Status: AC

## 2014-07-02 ENCOUNTER — Telehealth: Payer: Self-pay | Admitting: Cardiology

## 2014-07-02 NOTE — Telephone Encounter (Signed)
Spoke with pt, aware she can drive after 3 days.

## 2014-07-02 NOTE — Telephone Encounter (Signed)
Pt is calling back wanting to speak with Debra. Please f/u  Thanks

## 2014-07-02 NOTE — Telephone Encounter (Signed)
Summer Henderson,Summer Henderson is calling to find out when is it that she can drive after having stents placed in on Monday . Please call on cell [phone]  Thanks

## 2014-07-03 ENCOUNTER — Encounter: Payer: Self-pay | Admitting: General Practice

## 2014-07-16 ENCOUNTER — Ambulatory Visit (INDEPENDENT_AMBULATORY_CARE_PROVIDER_SITE_OTHER): Payer: Medicare Other | Admitting: Cardiology

## 2014-07-16 ENCOUNTER — Encounter: Payer: Self-pay | Admitting: Cardiology

## 2014-07-16 VITALS — BP 130/88 | HR 67 | Ht 60.0 in | Wt 171.8 lb

## 2014-07-16 DIAGNOSIS — G35 Multiple sclerosis: Secondary | ICD-10-CM | POA: Diagnosis not present

## 2014-07-16 DIAGNOSIS — Z79899 Other long term (current) drug therapy: Secondary | ICD-10-CM

## 2014-07-16 DIAGNOSIS — I4581 Long QT syndrome: Secondary | ICD-10-CM

## 2014-07-16 DIAGNOSIS — I10 Essential (primary) hypertension: Secondary | ICD-10-CM

## 2014-07-16 DIAGNOSIS — R9430 Abnormal result of cardiovascular function study, unspecified: Secondary | ICD-10-CM

## 2014-07-16 DIAGNOSIS — E785 Hyperlipidemia, unspecified: Secondary | ICD-10-CM

## 2014-07-16 DIAGNOSIS — R9431 Abnormal electrocardiogram [ECG] [EKG]: Secondary | ICD-10-CM

## 2014-07-16 DIAGNOSIS — Z9861 Coronary angioplasty status: Secondary | ICD-10-CM | POA: Diagnosis not present

## 2014-07-16 DIAGNOSIS — I2510 Atherosclerotic heart disease of native coronary artery without angina pectoris: Secondary | ICD-10-CM

## 2014-07-16 LAB — COMPREHENSIVE METABOLIC PANEL
ALT: 24 U/L (ref 0–35)
AST: 23 U/L (ref 0–37)
Albumin: 4.1 g/dL (ref 3.5–5.2)
Alkaline Phosphatase: 131 U/L — ABNORMAL HIGH (ref 39–117)
BUN: 7 mg/dL (ref 6–23)
CO2: 29 mEq/L (ref 19–32)
Calcium: 9.4 mg/dL (ref 8.4–10.5)
Chloride: 104 mEq/L (ref 96–112)
Creat: 0.77 mg/dL (ref 0.50–1.10)
Glucose, Bld: 86 mg/dL (ref 70–99)
Potassium: 4.9 mEq/L (ref 3.5–5.3)
Sodium: 142 mEq/L (ref 135–145)
Total Bilirubin: 0.4 mg/dL (ref 0.2–1.2)
Total Protein: 6.8 g/dL (ref 6.0–8.3)

## 2014-07-16 LAB — LIPID PANEL
Cholesterol: 120 mg/dL (ref 0–200)
HDL: 41 mg/dL — ABNORMAL LOW (ref >=46)
LDL Cholesterol: 59 mg/dL (ref 0–99)
Total CHOL/HDL Ratio: 2.9 Ratio
Triglycerides: 101 mg/dL (ref <150)
VLDL: 20 mg/dL (ref 0–40)

## 2014-07-16 NOTE — Progress Notes (Signed)
07/16/2014 Summer Henderson   [birth year]  [id number]  Primary Physician Rosario Adie, MD Primary Cardiologist: Dr Jens Som  HPI:  54 year old female with a prior history of hypertension, MS, prolonged QT interval, and mitral valve prolapse. She has been experiencing dyspnea on exertion and underwent stress testing in January 2016 revealing probable mild breast attenuation with possible mild mid anteroseptal ischemia. She was seen back in clinic on February 9 and recommendation was made for diagnostic catheterization. This was done February 22, revealing severe mid LAD stenosis and also moderate mid right coronary artery stenosis. The LAD was successfully stented using a 2.75 x 28 mm Promus drug-eluting stent. Fractional flow reserve was performed with in the right coronary artery and following administration of intracoronary adenosine, FFR dropped from 0.96-0.80. As result, this area was successfully stented using a 3.0 x 24 mm Promus drug eluting stent. She requires 162 mg of aspirin daily related to her diagnosis of multiple sclerosis. As a result, she was discharged home on Plavix 75 mg daily. She is in the office today for f/u. She has more energy and her DOE has resolved. She has not smoked since Feb 22nd.  She is back to exercising at the [facility].   Current Outpatient Prescriptions  Medication Sig Dispense Refill  . aspirin 81 MG tablet Take 2 tablets (162 mg total) by mouth daily. 30 tablet   . atorvastatin (LIPITOR) 40 MG tablet Take 1 tablet (40 mg total) by mouth daily at 6 PM. 30 tablet 6  . clonazePAM (KLONOPIN) 0.25 MG disintegrating tablet Take 1 tablet (0.25 mg total) by mouth 2 (two) times daily as needed. 60 tablet 1  . clopidogrel (PLAVIX) 75 MG tablet Take 1 tablet (75 mg total) by mouth daily. 30 tablet 6  . Dimethyl Fumarate (TECFIDERA) 240 MG CPDR Take 1 capsule (240 mg total) by mouth 2 (two) times daily. 180 capsule 1  . docusate sodium (COLACE) 250 MG capsule Take 1 capsule  (250 mg total) by mouth daily. 10 capsule 0  . DULoxetine (CYMBALTA) 60 MG capsule Take 1 capsule (60 mg total) by mouth 2 (two) times daily. (Patient taking differently: Take 60 mg by mouth daily. ) 180 capsule 3  . ENABLEX 7.5 MG 24 hr tablet Take 1 tablet by mouth daily.    Marland Kitchen gabapentin (NEURONTIN) 100 MG capsule Take 100 mg by mouth as needed.    . hydrocortisone (ANUSOL-HC) 25 MG suppository Place 1 suppository (25 mg total) rectally every 12 (twelve) hours. 12 suppository 0  . losartan (COZAAR) 100 MG tablet Take 1 tablet (100 mg total) by mouth daily. 90 tablet 4  . methylphenidate (RITALIN) 20 MG tablet Take 1 tablet (20 mg total) by mouth daily. At noon 30 tablet 0  . metoprolol succinate (TOPROL-XL) 50 MG 24 hr tablet Take 1 tablet (50 mg total) by mouth daily. 30 tablet 12  . modafinil (PROVIGIL) 100 MG tablet Take 100 mg by mouth daily.    Marland Kitchen NASONEX 50 MCG/ACT nasal spray Place 50 sprays into the nose daily.    Marland Kitchen oxyCODONE-acetaminophen (PERCOCET/ROXICET) 5-325 MG per tablet Take 2 tablets by mouth every 4 (four) hours as needed for severe pain. 20 tablet 0  . pantoprazole (PROTONIX) 40 MG tablet Take 1 tablet (40 mg total) by mouth daily. 30 tablet 6   No current facility-administered medications for this visit.    Allergies  Allergen Reactions  . Demerol [Meperidine]     Other reaction(s): Other (See Comments) [  derm] rash, swelling, itching  . Erythromycin   . Morphine And Related   . Sulfa Antibiotics   . Diflucan [Fluconazole]   . Other     Other reaction(s): Other (See Comments) Z-pak causes EKG changes, was told by cardiologist not to take this.  . Tape     Adhesive tape  . Toviaz [Fesoterodine Fumarate Er] Palpitations    History   Social History  . Marital Status: Divorced    Spouse Name: N/A  . Number of Children: 2  . Years of Education: 13   Occupational History  . Disabled     Disability   Social History Main Topics  . Smoking status: Current  Every Day Smoker -- 1.00 packs/day for 35 years    Types: Cigarettes  . Smokeless tobacco: Never Used  . Alcohol Use: No  . Drug Use: No  . Sexual Activity: Not on file   Other Topics Concern  . Not on file   Social History Narrative     Review of Systems: General: negative for chills, fever, night sweats or weight changes.  Cardiovascular: negative for chest pain, dyspnea on exertion, edema, orthopnea, palpitations, paroxysmal nocturnal dyspnea or shortness of breath Dermatological: negative for rash Respiratory: negative for cough or wheezing Urologic: negative for hematuria Abdominal: negative for nausea, vomiting, diarrhea, bright red blood per rectum, melena, or hematemesis Neurologic: negative for visual changes, syncope, or dizziness All other systems reviewed and are otherwise negative except as noted above.    Blood pressure 130/88, pulse 67, height 5' (1.524 m), weight 171 lb 12.8 oz (77.928 kg).  General appearance: alert, cooperative, no distress and moderately obese Neck: no carotid bruit and no JVD Lungs: clear to auscultation bilaterally Heart: regular rate and rhythm Extremities: no edema  EKG NST-QTc 452  ASSESSMENT AND PLAN:    Multiple sclerosis Requires ASA 162 mg   Essential hypertension Controlled   Abnormal cardiovascular function study Jan 2016- prompting diagnostic cath   CAD S/P LAD and RCA DES 06/29/14 DOE and fatigue has markedly improved.  PLAN  CMET and Lipids before her visit with Dr Jens Som in April  [facility] 07/16/2014 9:30 AM

## 2014-07-16 NOTE — Assessment & Plan Note (Signed)
DOE and fatigue has markedly improved.

## 2014-07-16 NOTE — Assessment & Plan Note (Signed)
Jan 2016- prompting diagnostic cath

## 2014-07-16 NOTE — Assessment & Plan Note (Signed)
Controlled.  

## 2014-07-16 NOTE — Assessment & Plan Note (Signed)
Requires ASA 162 mg

## 2014-07-16 NOTE — Patient Instructions (Signed)
Your physician recommends that you return for lab work in: FASTING prior to your appointment with Dr. Jens Som.  Keep your April appointment with Dr. Jens Som.

## 2014-07-22 ENCOUNTER — Emergency Department (HOSPITAL_BASED_OUTPATIENT_CLINIC_OR_DEPARTMENT_OTHER)
Admission: EM | Admit: 2014-07-22 | Discharge: 2014-07-23 | Disposition: A | Discharge status: Home / Self Care | Payer: Medicare Other | Attending: Emergency Medicine | Admitting: Emergency Medicine

## 2014-07-22 ENCOUNTER — Encounter (HOSPITAL_BASED_OUTPATIENT_CLINIC_OR_DEPARTMENT_OTHER): Payer: Self-pay

## 2014-07-22 DIAGNOSIS — Z7902 Long term (current) use of antithrombotics/antiplatelets: Secondary | ICD-10-CM | POA: Diagnosis not present

## 2014-07-22 DIAGNOSIS — Z720 Tobacco use: Secondary | ICD-10-CM | POA: Insufficient documentation

## 2014-07-22 DIAGNOSIS — I2510 Atherosclerotic heart disease of native coronary artery without angina pectoris: Secondary | ICD-10-CM | POA: Insufficient documentation

## 2014-07-22 DIAGNOSIS — R600 Localized edema: Secondary | ICD-10-CM | POA: Insufficient documentation

## 2014-07-22 DIAGNOSIS — K219 Gastro-esophageal reflux disease without esophagitis: Secondary | ICD-10-CM | POA: Diagnosis not present

## 2014-07-22 DIAGNOSIS — I10 Essential (primary) hypertension: Secondary | ICD-10-CM | POA: Insufficient documentation

## 2014-07-22 DIAGNOSIS — F329 Major depressive disorder, single episode, unspecified: Secondary | ICD-10-CM | POA: Insufficient documentation

## 2014-07-22 DIAGNOSIS — Z79899 Other long term (current) drug therapy: Secondary | ICD-10-CM | POA: Insufficient documentation

## 2014-07-22 DIAGNOSIS — Z8701 Personal history of pneumonia (recurrent): Secondary | ICD-10-CM | POA: Diagnosis not present

## 2014-07-22 DIAGNOSIS — Z9889 Other specified postprocedural states: Secondary | ICD-10-CM | POA: Insufficient documentation

## 2014-07-22 DIAGNOSIS — Z7982 Long term (current) use of aspirin: Secondary | ICD-10-CM | POA: Insufficient documentation

## 2014-07-22 DIAGNOSIS — Z87410 Personal history of cervical dysplasia: Secondary | ICD-10-CM | POA: Diagnosis not present

## 2014-07-22 DIAGNOSIS — F419 Anxiety disorder, unspecified: Secondary | ICD-10-CM | POA: Insufficient documentation

## 2014-07-22 DIAGNOSIS — G35 Multiple sclerosis: Secondary | ICD-10-CM | POA: Insufficient documentation

## 2014-07-22 DIAGNOSIS — M1990 Unspecified osteoarthritis, unspecified site: Secondary | ICD-10-CM | POA: Diagnosis not present

## 2014-07-22 DIAGNOSIS — M7989 Other specified soft tissue disorders: Secondary | ICD-10-CM

## 2014-07-22 DIAGNOSIS — M6530 Trigger finger, unspecified finger: Secondary | ICD-10-CM | POA: Insufficient documentation

## 2014-07-22 NOTE — ED Notes (Signed)
Pt reports one day of RLE swelling, denies pain, no redness, no injuries, pt reports cardiac cath 3 weeks ago.

## 2014-07-23 ENCOUNTER — Emergency Department (HOSPITAL_BASED_OUTPATIENT_CLINIC_OR_DEPARTMENT_OTHER): Payer: Medicare Other

## 2014-07-23 ENCOUNTER — Telehealth: Payer: Self-pay | Admitting: Cardiology

## 2014-07-23 DIAGNOSIS — R600 Localized edema: Secondary | ICD-10-CM | POA: Diagnosis not present

## 2014-07-23 NOTE — Telephone Encounter (Signed)
Attempted to reach pt on home number, phone rang w/ VM pickup. LMOM to call us back.

## 2014-07-23 NOTE — ED Notes (Addendum)
Pt reports cardiac cath x 3 weeks ago, several days of RLE swelling from knee down to ankle area, mild swelling noted, measured L calf 12.5 in, R calf 13.25 in.  Small abrasions above bilateral knees which pt reports have been in place previously, denies any new trauma.  Currently on plavix for stents placed during cath 3 weeks ago and denies missing any doses.  Multiple varicose veins noted on BLE.

## 2014-07-23 NOTE — Telephone Encounter (Signed)
Can this encounter be closed?

## 2014-07-23 NOTE — Telephone Encounter (Signed)
1. Spoke with patient, she complains of edema from her right calf up to her thigh. No swelling is evident in her ankles.  She noticed this yesterday and went to the ER yesterday as well.  They did a doppler that was negative for DVT.  She does not have any discomfort in the leg currently, but did during the doppler yesterday.  She reports that both legs are the same color and temperature.  She recently had a cath with PCI and radial access was used.  2. Patient reports severe GERD symptoms since hospital D/C.  She was previously on Nexium, but that was changed to protonix due to the addition of Plavix.  She is not receiving any relief from her GERD symptoms with protonix.   I will defer to Dr Jens Som. Her next appt with Dr Jens Som is 08/12/14

## 2014-07-23 NOTE — ED Provider Notes (Signed)
CSN: [id number]     Arrival date & time 07/22/14  2259 History   First MD Initiated Contact with Patient 07/23/14 0132     Chief Complaint  Patient presents with  . Leg Swelling     (Consider location/radiation/quality/duration/timing/severity/associated sxs/prior Treatment) HPI  This is a 54 year old female who had a cardiac catheterization on February 22 of this year. The catheter was placed to the right radial artery. She had 2 stents placed for severe stenoses. She is here now because her family noticed that her right calf was larger than her left calf. On arrival her right calf measured 13.25 inches in circumference in her left calf 12.5 inches in circumference. She is currently on Plavix and has not missed any doses. The swelling is not painful. There is no associated redness or warmth.   Past Medical History  Diagnosis Date  . Multiple sclerosis   . Chronic fatigue   . Cervical dysplasia   . GERD (gastroesophageal reflux disease)   . Carpal tunnel syndrome   . Prolonged QT interval   . Hypertension   . Mitral valve prolapse   . Anxiety   . Arthritis   . Depression   . Pneumonia   . CAD (coronary artery disease)     a. 06/2014 abnl MV;  b. 06/2014 Cath: LM nl, LAD 87m (2.75x28 Promus DES), LCX nl, RCA 60-15m (FFR = 0.80 -> 3.0x24 Promus DES), EF 55-65%.   Past Surgical History  Procedure Laterality Date  . Rectovaginal fistula repair    . Abdominal hysterectomy    . Dilation and curettage of uterus    . Foot surgery Bilateral     For bunions  . Carpal tunnel release    . Bladder resuspension    . Hammer toe surgery Left   . Cervical spine surgery  10-16-2009  . Orif toe fracture Right   . Biceps tendon repair    . Prolong qtc      Heart  . Left heart catheterization with coronary angiogram N/A 06/29/2014    Procedure: LEFT HEART CATHETERIZATION WITH CORONARY ANGIOGRAM;  Surgeon: Micheline Chapman, MD;  Location: [facility] CATH LAB;  Service: Cardiovascular;  Laterality: N/A;    Family History  Problem Relation Age of Onset  . Melanoma Mother   . Breast cancer Maternal Grandmother   . Brain cancer Maternal Grandfather   . Lung cancer Maternal Grandfather   . Colon cancer Maternal Grandmother   . Colon polyps Mother   . Diabetes Mother   . Diabetes Maternal Grandfather   . Kidney disease Neg Hx   . Esophageal cancer Neg Hx   . Gallbladder disease Neg Hx    History  Substance Use Topics  . Smoking status: Current Every Day Smoker -- 1.00 packs/day for 35 years    Types: Cigarettes  . Smokeless tobacco: Never Used  . Alcohol Use: No   OB History    No data available     Review of Systems  All other systems reviewed and are negative.   Allergies  Demerol; Erythromycin; Morphine and related; Sulfa antibiotics; Diflucan; Other; Tape; and Toviaz  Home Medications   Prior to Admission medications   Medication Sig Start Date End Date Taking? Authorizing Provider  aspirin 81 MG tablet Take 2 tablets (162 mg total) by mouth daily. 06/30/14   Ok Anis, NP  atorvastatin (LIPITOR) 40 MG tablet Take 1 tablet (40 mg total) by mouth daily at 6 PM. 06/30/14   Alcario Drought  Brion Aliment, NP  clonazePAM (KLONOPIN) 0.25 MG disintegrating tablet Take 1 tablet (0.25 mg total) by mouth 2 (two) times daily as needed. 10/11/12   York Spaniel, MD  clopidogrel (PLAVIX) 75 MG tablet Take 1 tablet (75 mg total) by mouth daily. 07/01/14   Ok Anis, NP  Dimethyl Fumarate (TECFIDERA) 240 MG CPDR Take 1 capsule (240 mg total) by mouth 2 (two) times daily. 06/20/14   York Spaniel, MD  docusate sodium (COLACE) 250 MG capsule Take 1 capsule (250 mg total) by mouth daily. 04/20/14   Elson Areas, PA-C  DULoxetine (CYMBALTA) 60 MG capsule Take 1 capsule (60 mg total) by mouth 2 (two) times daily. Patient taking differently: Take 60 mg by mouth daily.  07/03/13   York Spaniel, MD  ENABLEX 7.5 MG 24 hr tablet Take 1 tablet by mouth daily. 06/18/14   Historical  Provider, MD  gabapentin (NEURONTIN) 100 MG capsule Take 100 mg by mouth as needed.    Historical Provider, MD  hydrocortisone (ANUSOL-HC) 25 MG suppository Place 1 suppository (25 mg total) rectally every 12 (twelve) hours. 05/11/14   Meredith Pel, NP  losartan (COZAAR) 100 MG tablet Take 1 tablet (100 mg total) by mouth daily. 12/17/13   Lewayne Bunting, MD  methylphenidate (RITALIN) 20 MG tablet Take 1 tablet (20 mg total) by mouth daily. At noon 06/23/14   York Spaniel, MD  metoprolol succinate (TOPROL-XL) 50 MG 24 hr tablet Take 1 tablet (50 mg total) by mouth daily. 06/17/14   Lewayne Bunting, MD  modafinil (PROVIGIL) 100 MG tablet Take 100 mg by mouth daily.    Historical Provider, MD  NASONEX 50 MCG/ACT nasal spray Place 50 sprays into the nose daily. 09/16/12   Historical Provider, MD  oxyCODONE-acetaminophen (PERCOCET/ROXICET) 5-325 MG per tablet Take 2 tablets by mouth every 4 (four) hours as needed for severe pain. 04/20/14   Elson Areas, PA-C  pantoprazole (PROTONIX) 40 MG tablet Take 1 tablet (40 mg total) by mouth daily. 06/30/14   Ok Anis, NP   BP 101/72 mmHg  Pulse 72  Temp(Src) 97.6 F (36.4 C) (Oral)  Resp 18  Ht  (1.549 m)  Wt 170 lb (77.111 kg)  BMI 32.14 kg/m2  SpO2 99%   Physical Exam  General: Well-developed, well-nourished female in no acute distress; appearance consistent with age of record HENT: normocephalic; atraumatic Eyes: pupils equal, round and reactive to light; extraocular muscles intact Neck: supple Heart: regular rate and rhythm Lungs: clear to auscultation bilaterally Abdomen: soft; nondistended; nontender; no masses or hepatosplenomegaly; bowel sounds present Extremities: No deformity; full range of motion; pulses normal; trace edema of right lower leg, no calf tenderness Neurologic: Awake, alert and oriented; motor function intact in all extremities and symmetric; no facial droop Skin: Warm and dry Psychiatric: Normal mood  and affect    ED Course  Procedures (including critical care time)   MDM  Nursing notes and vitals signs, including pulse oximetry, reviewed.  Summary of this visit's results, reviewed by myself:  Labs:  No results found for this or any previous visit (from the past 24 hour(s)).  Imaging Studies: US Venous Img Lower Unilateral Right  07/23/2014   CLINICAL DATA:  Right lower extremity swelling  EXAM: Right LOWER EXTREMITY VENOUS DOPPLER ULTRASOUND  TECHNIQUE: Gray-scale sonography with graded compression, as well as color Doppler and duplex ultrasound were performed to evaluate the lower extremity deep venous systems from the level  of the common femoral vein and including the common femoral, femoral, profunda femoral, popliteal and calf veins including the posterior tibial, peroneal and gastrocnemius veins when visible. The superficial great saphenous vein was also interrogated. Spectral Doppler was utilized to evaluate flow at rest and with distal augmentation maneuvers in the common femoral, femoral and popliteal veins.  COMPARISON:  None.  FINDINGS: Contralateral Common Femoral Vein: Respiratory phasicity is normal and symmetric with the symptomatic side. No evidence of thrombus. Normal compressibility.  Common Femoral Vein: No evidence of thrombus. Normal compressibility, respiratory phasicity and response to augmentation.  Saphenofemoral Junction: No evidence of thrombus. Normal compressibility and flow on color Doppler imaging.  Profunda Femoral Vein: No evidence of thrombus. Normal compressibility and flow on color Doppler imaging.  Femoral Vein: No evidence of thrombus. Normal compressibility, respiratory phasicity and response to augmentation.  Popliteal Vein: No evidence of thrombus. Normal compressibility, respiratory phasicity and response to augmentation.  Calf Veins: No evidence of thrombus. Normal compressibility and flow on color Doppler imaging.  Superficial Great Saphenous Vein: No  evidence of thrombus. Normal compressibility and flow on color Doppler imaging.  Venous Reflux:  None.  Other Findings:  None.  IMPRESSION: No evidence of deep venous thrombosis.   Electronically Signed   By: Ellery Plunk M.D.   On: 07/23/2014 01:27   1:42 AM Patient was advised of Doppler findings. She will follow up with her cardiologist tomorrow.  Paula Libra, MD 07/23/14 [phone]

## 2014-07-23 NOTE — Telephone Encounter (Signed)
Ok to increase protonix to BID; fu as scheduled. Olga Millers

## 2014-07-23 NOTE — Telephone Encounter (Signed)
I spoke with patient. She seemed very upset at everything that I had to say.  She does not want to take the protonix twice a day.  She wants to take her Nexium.  She is adamant that that is the only medicine that will work for her.  She said that during her hospitalization they were going to put her on another med in place of Plavix and she wants to try that one so she can take her nexium.  I advised her to take another Protonix tonight and I will speak with Dr Jens Som in the morning.    She was upset that I didn't have any information concerning her leg swelling.  I advised to keep her April appt with Dr Jens Som and that they doppler was negative. I will address with Dr Jens Som in the am.

## 2014-07-23 NOTE — Discharge Instructions (Signed)

## 2014-07-23 NOTE — ED Notes (Signed)
Patient transported to ultrasound ambulatory with tech. 

## 2014-07-23 NOTE — Telephone Encounter (Signed)
Pt called in stating that she was just released from the ER due to extreme swelling on the rt leg. She states that a doppler was done and no clots were found so the doctor thinks it may be a circulation issue and would like for her to follow up with her cardiologist. Please call  Thanks

## 2014-07-23 NOTE — Telephone Encounter (Signed)
Left message to call on machine

## 2014-07-24 MED ORDER — ASPIRIN 81 MG PO TABS: 81.0000 mg | ORAL_TABLET | Freq: Every day | ORAL | Status: DC

## 2014-07-24 MED ORDER — TICAGRELOR 90 MG PO TABS: 90.0000 mg | ORAL_TABLET | Freq: Two times a day (BID) | ORAL | Status: DC

## 2014-07-24 MED ORDER — ESOMEPRAZOLE MAGNESIUM 40 MG PO CPDR: 40.0000 mg | DELAYED_RELEASE_CAPSULE | Freq: Every day | ORAL | Status: DC

## 2014-07-24 NOTE — Telephone Encounter (Signed)
I spoke with Dr Jens Som and he agreed to change the Plavix to brilinta so that patient can take her nexium.    He does not want to make any changes in her treatment plan due to the edema in her leg.  I called patient and informed her that we can make the medication change.  I advised her to take the Brilinta twice a day and decrease her ASA to  daily.  Stop the Plavix and protonix.  I sent the RXs in to the pharmacy.  Patient verbalized understanding.  I stressed the importance of taking the Brilinta and not missing any doses.  I also advised patient to continue to monitor her leg.  If any change in symptoms, please give [place] a call.

## 2014-07-27 ENCOUNTER — Telehealth: Payer: Self-pay

## 2014-07-27 ENCOUNTER — Other Ambulatory Visit: Payer: Self-pay

## 2014-07-27 ENCOUNTER — Other Ambulatory Visit: Payer: Self-pay | Admitting: Neurology

## 2014-07-27 MED ORDER — METHYLPHENIDATE HCL 20 MG PO TABS: 20.0000 mg | ORAL_TABLET | Freq: Every day | ORAL | Status: DC

## 2014-07-27 MED ORDER — MODAFINIL 200 MG PO TABS: 200.0000 mg | ORAL_TABLET | Freq: Every day | ORAL | Status: DC

## 2014-07-27 NOTE — Telephone Encounter (Signed)
Patient is calling as she need a written Rx Ritalin 20 mg.  She also needs Rx Provigil 100 mg to be refilled.  Please call.

## 2014-07-27 NOTE — Telephone Encounter (Signed)
Called patient to inform Rx ready for pick up at front desk. No answer. Will try back later.

## 2014-07-27 NOTE — Telephone Encounter (Signed)
Rx signed and faxed.

## 2014-07-28 NOTE — Telephone Encounter (Signed)
Patient returned called and I informed her Rx was ready for pick up at front desk.  Patient verbalized understanding.

## 2014-07-31 ENCOUNTER — Emergency Department (HOSPITAL_BASED_OUTPATIENT_CLINIC_OR_DEPARTMENT_OTHER)
Admission: EM | Admit: 2014-07-31 | Discharge: 2014-07-31 | Discharge status: Against Medical Advice | Payer: Medicare Other | Attending: Emergency Medicine | Admitting: Emergency Medicine

## 2014-07-31 ENCOUNTER — Encounter (HOSPITAL_BASED_OUTPATIENT_CLINIC_OR_DEPARTMENT_OTHER): Payer: Self-pay | Admitting: *Deleted

## 2014-07-31 DIAGNOSIS — R2241 Localized swelling, mass and lump, right lower limb: Secondary | ICD-10-CM | POA: Diagnosis not present

## 2014-07-31 DIAGNOSIS — M7981 Nontraumatic hematoma of soft tissue: Secondary | ICD-10-CM | POA: Diagnosis not present

## 2014-07-31 DIAGNOSIS — I10 Essential (primary) hypertension: Secondary | ICD-10-CM | POA: Insufficient documentation

## 2014-07-31 DIAGNOSIS — I2510 Atherosclerotic heart disease of native coronary artery without angina pectoris: Secondary | ICD-10-CM | POA: Diagnosis not present

## 2014-07-31 DIAGNOSIS — Z720 Tobacco use: Secondary | ICD-10-CM | POA: Insufficient documentation

## 2014-07-31 NOTE — ED Notes (Signed)
Pt observed walking out of ER and across parking lot to her car with a quick and steady gait. Pt was not in any visible distress.

## 2014-07-31 NOTE — ED Notes (Signed)
Pt was seen her last week for right leg swelling. She was checked for a clot and found to not have a DVT. Pt visited her cardiologist who recommended no further testing. Now, pt has bruising and a hematoma behind her right knee.

## 2014-08-03 ENCOUNTER — Telehealth: Payer: Self-pay

## 2014-08-03 NOTE — Telephone Encounter (Signed)
[facility]) has approved the request for coverage on Modafinil effective until 01/31/2015 Ref # [id number]

## 2014-08-12 ENCOUNTER — Ambulatory Visit (INDEPENDENT_AMBULATORY_CARE_PROVIDER_SITE_OTHER): Payer: Medicare Other | Admitting: Cardiology

## 2014-08-12 ENCOUNTER — Encounter: Payer: Self-pay | Admitting: Cardiology

## 2014-08-12 VITALS — BP 152/90 | HR 66 | Ht 61.0 in | Wt 170.1 lb

## 2014-08-12 DIAGNOSIS — I4581 Long QT syndrome: Secondary | ICD-10-CM | POA: Diagnosis not present

## 2014-08-12 DIAGNOSIS — I10 Essential (primary) hypertension: Secondary | ICD-10-CM

## 2014-08-12 DIAGNOSIS — E785 Hyperlipidemia, unspecified: Secondary | ICD-10-CM | POA: Insufficient documentation

## 2014-08-12 DIAGNOSIS — R9431 Abnormal electrocardiogram [ECG] [EKG]: Secondary | ICD-10-CM

## 2014-08-12 DIAGNOSIS — Z720 Tobacco use: Secondary | ICD-10-CM | POA: Diagnosis not present

## 2014-08-12 DIAGNOSIS — I2510 Atherosclerotic heart disease of native coronary artery without angina pectoris: Secondary | ICD-10-CM

## 2014-08-12 DIAGNOSIS — I341 Nonrheumatic mitral (valve) prolapse: Secondary | ICD-10-CM | POA: Diagnosis not present

## 2014-08-12 DIAGNOSIS — Z9861 Coronary angioplasty status: Secondary | ICD-10-CM

## 2014-08-12 MED ORDER — TICAGRELOR 90 MG PO TABS: 90.0000 mg | ORAL_TABLET | Freq: Two times a day (BID) | ORAL | Status: DC

## 2014-08-12 MED ORDER — ATORVASTATIN CALCIUM 40 MG PO TABS: 40.0000 mg | ORAL_TABLET | Freq: Every day | ORAL | Status: DC

## 2014-08-12 MED ORDER — METOPROLOL SUCCINATE ER 50 MG PO TB24: 50.0000 mg | ORAL_TABLET | Freq: Every day | ORAL | Status: DC

## 2014-08-12 MED ORDER — LOSARTAN POTASSIUM-HCTZ 100-12.5 MG PO TABS: 1.0000 | ORAL_TABLET | Freq: Every day | ORAL | Status: DC

## 2014-08-12 NOTE — Assessment & Plan Note (Signed)
Plan to continue aspirin, brilinta, and statin.

## 2014-08-12 NOTE — Assessment & Plan Note (Signed)
Blood pressure elevated. Add HCTZ 12.5 mg daily. Check potassium and renal function in 2 weeks.

## 2014-08-12 NOTE — Assessment & Plan Note (Signed)
Continue statin. Recent alkaline phosphatase mildly elevated. Check lipids, liver, 5' nucleotidase and GGT.

## 2014-08-12 NOTE — Assessment & Plan Note (Signed)
No history of syncope. ?

## 2014-08-12 NOTE — Progress Notes (Signed)
HPI:  FU CAD, hypertension, prolonged QT and mitral valve prolapse. Previously followed in [facility]. Holter monitor in September of 2012 showed sinus rhythm with rare PVC. Echocardiogram in June of 2015 showed normal LV function and mild left atrial enlargement. There was trace mitral regurgitation. Patient had an abdominal CT for abdominal pain in December 2015. 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 Nuclear study January 2016 showed an ejection fraction of 56%, probable mild breast attenuation with possible mild mid anteroseptal ischemia. Cardiac catheterization February 2016 showed 80% LAD and 60-70% RCA. Ejection fraction 55-65%. The patient had PCI of the LAD and RVC, ultrasound March 2016 showed no DVT. Since she was last seen, her dyspnea has improved. No chest pain, palpitations or syncope.  Current Outpatient Prescriptions  Medication Sig Dispense Refill  . aspirin 81 MG tablet Take 1 tablet (81 mg total) by mouth daily. 30 tablet   . atorvastatin (LIPITOR) 40 MG tablet Take 1 tablet (40 mg total) by mouth daily at 6 PM. 30 tablet 6  . clonazePAM (KLONOPIN) 0.25 MG disintegrating tablet Take 1 tablet (0.25 mg total) by mouth 2 (two) times daily as needed. 60 tablet 1  . Dimethyl Fumarate (TECFIDERA) 240 MG CPDR Take 1 capsule (240 mg total) by mouth 2 (two) times daily. 180 capsule 1  . docusate sodium (COLACE) 250 MG capsule Take 1 capsule (250 mg total) by mouth daily. 10 capsule 0  . DULoxetine (CYMBALTA) 60 MG capsule Take 1 capsule (60 mg total) by mouth 2 (two) times daily. (Patient taking differently: Take 60 mg by mouth daily. ) 180 capsule 3  . ENABLEX 7.5 MG 24 hr tablet Take 1 tablet by mouth daily.    Marland Kitchen esomeprazole (NEXIUM) 40 MG capsule Take 1 capsule (40 mg total) by mouth daily at 12 noon. 30 capsule 11  . gabapentin (NEURONTIN) 100 MG capsule Take 100 mg by mouth as needed.    . hydrocortisone (ANUSOL-HC) 25 MG  suppository Place 1 suppository (25 mg total) rectally every 12 (twelve) hours. 12 suppository 0  . losartan (COZAAR) 100 MG tablet Take 1 tablet (100 mg total) by mouth daily. 90 tablet 4  . methylphenidate (RITALIN) 20 MG tablet Take 1 tablet (20 mg total) by mouth daily. At noon 30 tablet 0  . metoprolol succinate (TOPROL-XL) 50 MG 24 hr tablet Take 1 tablet (50 mg total) by mouth daily. 30 tablet 12  . modafinil (PROVIGIL) 200 MG tablet Take 1 tablet (200 mg total) by mouth daily. 30 tablet 5  . NASONEX 50 MCG/ACT nasal spray Place 50 sprays into the nose daily.    Marland Kitchen oxyCODONE-acetaminophen (PERCOCET/ROXICET) 5-325 MG per tablet Take 2 tablets by mouth every 4 (four) hours as needed for severe pain. 20 tablet 0  . ticagrelor (BRILINTA) 90 MG TABS tablet Take 1 tablet (90 mg total) by mouth 2 (two) times daily. 60 tablet 6   No current facility-administered medications for this visit.     Past Medical History  Diagnosis Date  . Multiple sclerosis   . Chronic fatigue   . Cervical dysplasia   . GERD (gastroesophageal reflux disease)   . Carpal tunnel syndrome   . Prolonged QT interval   . Hypertension   . Mitral valve prolapse   . Anxiety   . Arthritis   . Depression   . Pneumonia   . CAD (coronary artery disease)     a. 06/2014 abnl  MV;  b. 06/2014 Cath: LM nl, LAD 15m (2.75x28 Promus DES), LCX nl, RCA 60-66m (FFR = 0.80 -> 3.0x24 Promus DES), EF 55-65%.    Past Surgical History  Procedure Laterality Date  . Rectovaginal fistula repair    . Abdominal hysterectomy    . Dilation and curettage of uterus    . Foot surgery Bilateral     For bunions  . Carpal tunnel release    . Bladder resuspension    . Hammer toe surgery Left   . Cervical spine surgery  10-16-2009  . Orif toe fracture Right   . Biceps tendon repair    . Prolong qtc      Heart  . Left heart catheterization with coronary angiogram N/A 06/29/2014    Procedure: LEFT HEART CATHETERIZATION WITH CORONARY ANGIOGRAM;   Surgeon: Micheline Chapman, MD;  Location: [facility] CATH LAB;  Service: Cardiovascular;  Laterality: N/A;    History   Social History  . Marital Status: Divorced    Spouse Name: N/A  . Number of Children: 2  . Years of Education: 13   Occupational History  . Disabled     Disability   Social History Main Topics  . Smoking status: Current Every Day Smoker -- 1.00 packs/day for 35 years    Types: Cigarettes  . Smokeless tobacco: Never Used  . Alcohol Use: No  . Drug Use: No  . Sexual Activity: Not on file   Other Topics Concern  . Not on file   Social History Narrative    ROS: no fevers or chills, productive cough, hemoptysis, dysphasia, odynophagia, melena, hematochezia, dysuria, hematuria, rash, seizure activity, orthopnea, PND, pedal edema, claudication. Remaining systems are negative.  Physical Exam: Well-developed well-nourished in no acute distress.  Skin mild diffuse ecchymosis HEENT is normal.  Neck is supple.  Chest is clear to auscultation with normal expansion.  Cardiovascular exam is regular rate and rhythm.  Abdominal exam nontender or distended. No masses palpated. Extremities show no edema. neuro grossly intact

## 2014-08-12 NOTE — Patient Instructions (Signed)
Your physician wants you to follow-up in: 6 MONTHS WITH DR Jens Som You will receive a reminder letter in the mail two months in advance. If you don't receive a letter, please call our office to schedule the follow-up appointment.   STOP LOSARTAN  START LOSARTAN/HCTZ 100/12.5 MG ONCE DAILY  Your physician recommends that you return for lab work in:2 WEEKS=DO NOT EAT PRIOR TO LAB WORK

## 2014-08-14 ENCOUNTER — Other Ambulatory Visit: Payer: Self-pay

## 2014-08-14 DIAGNOSIS — E785 Hyperlipidemia, unspecified: Secondary | ICD-10-CM

## 2014-08-14 MED ORDER — ATORVASTATIN CALCIUM 40 MG PO TABS: 40.0000 mg | ORAL_TABLET | Freq: Every day | ORAL | Status: DC

## 2014-08-14 NOTE — Telephone Encounter (Signed)
Hyperlipidemia - Lewayne Bunting, MD at 08/12/2014 12:06 PM     Status: Written Related Problem: Hyperlipidemia   Expand All Collapse All   Continue statin. Recent alkaline phosphatase mildly elevated. Check lipids, liver, 5' nucleotidase and GGT.           atorvastatin (LIPITOR) 40 MG tablet Take 1 tablet (40 mg total) by mouth daily at 6 PM             Pt requesting 90 day supply

## 2014-08-17 ENCOUNTER — Other Ambulatory Visit: Payer: Self-pay | Admitting: *Deleted

## 2014-08-17 DIAGNOSIS — R9431 Abnormal electrocardiogram [ECG] [EKG]: Secondary | ICD-10-CM

## 2014-08-17 MED ORDER — METOPROLOL SUCCINATE ER 50 MG PO TB24: 50.0000 mg | ORAL_TABLET | Freq: Every day | ORAL | Status: DC

## 2014-08-24 ENCOUNTER — Telehealth: Payer: Self-pay | Admitting: Neurology

## 2014-08-24 NOTE — Telephone Encounter (Signed)
Pt is calling requesting a written Rx for methylphenidate (RITALIN) 20 MG tablet. Please call when ready for pick up. °

## 2014-08-26 ENCOUNTER — Telehealth: Payer: Self-pay

## 2014-08-26 ENCOUNTER — Other Ambulatory Visit: Payer: Self-pay

## 2014-08-26 MED ORDER — METHYLPHENIDATE HCL 20 MG PO TABS: 20.0000 mg | ORAL_TABLET | Freq: Every day | ORAL | Status: DC

## 2014-08-26 NOTE — Telephone Encounter (Signed)
Refill for RITALIN 20MG . Plese call patient when is ready to pick up.

## 2014-08-26 NOTE — Telephone Encounter (Signed)
Patient called this morning to follow up on the message that was sent on 08/24/14 regarding her refill for C/b [id number]

## 2014-08-26 NOTE — Telephone Encounter (Signed)
Please see routing history.  Original message was never forwarded to anyone.  Request has now been entered and sent to provider for approval.

## 2014-08-26 NOTE — Telephone Encounter (Signed)
Left voicemail. Rx is ready for pick up at desk with photo id.

## 2014-08-28 LAB — COMPREHENSIVE METABOLIC PANEL
ALBUMIN: 4.1 g/dL (ref 3.5–5.2)
ALT: 26 U/L (ref 0–35)
AST: 24 U/L (ref 0–37)
Alkaline Phosphatase: 135 U/L — ABNORMAL HIGH (ref 39–117)
BILIRUBIN TOTAL: 0.6 mg/dL (ref 0.2–1.2)
BUN: 8 mg/dL (ref 6–23)
CO2: 25 mEq/L (ref 19–32)
Calcium: 9.3 mg/dL (ref 8.4–10.5)
Chloride: 102 mEq/L (ref 96–112)
Creat: 0.69 mg/dL (ref 0.50–1.10)
Glucose, Bld: 92 mg/dL (ref 70–99)
POTASSIUM: 4.5 meq/L (ref 3.5–5.3)
Sodium: 138 mEq/L (ref 135–145)
Total Protein: 7 g/dL (ref 6.0–8.3)

## 2014-08-28 LAB — LIPID PANEL
CHOLESTEROL: 128 mg/dL (ref 0–200)
HDL: 48 mg/dL (ref >=46)
LDL Cholesterol: 63 mg/dL (ref 0–99)
Total CHOL/HDL Ratio: 2.7 Ratio
Triglycerides: 83 mg/dL (ref <150)
VLDL: 17 mg/dL (ref 0–40)

## 2014-08-28 LAB — GAMMA GT: GGT: 12 U/L (ref 7–51)

## 2014-09-04 LAB — NUCLEOTIDASE, 5', BLOOD: 5 NUCLEOTIDASE: 5 U/L (ref <11)

## 2014-09-28 ENCOUNTER — Telehealth: Payer: Self-pay

## 2014-09-28 DIAGNOSIS — G35 Multiple sclerosis: Secondary | ICD-10-CM

## 2014-09-28 MED ORDER — METHYLPHENIDATE HCL 20 MG PO TABS: 20.0000 mg | ORAL_TABLET | Freq: Every day | ORAL | Status: DC

## 2014-09-28 NOTE — Telephone Encounter (Signed)
Patient called and requested a Rx refill for Ritalin.

## 2014-09-28 NOTE — Telephone Encounter (Signed)
I left the patient a voicemail that Rx is ready for pick up, per patient request.

## 2014-09-28 NOTE — Telephone Encounter (Signed)
Refilled ritalin for patient for Dr. Anne Hahn. Ready for pick up.

## 2014-10-21 ENCOUNTER — Ambulatory Visit (INDEPENDENT_AMBULATORY_CARE_PROVIDER_SITE_OTHER): Payer: Medicare Other | Admitting: Neurology

## 2014-10-21 ENCOUNTER — Encounter: Payer: Self-pay | Admitting: Neurology

## 2014-10-21 VITALS — BP 117/83 | HR 76 | Ht 62.0 in | Wt 173.6 lb

## 2014-10-21 DIAGNOSIS — R269 Unspecified abnormalities of gait and mobility: Secondary | ICD-10-CM

## 2014-10-21 DIAGNOSIS — G35 Multiple sclerosis: Secondary | ICD-10-CM

## 2014-10-21 DIAGNOSIS — Z5181 Encounter for therapeutic drug level monitoring: Secondary | ICD-10-CM | POA: Diagnosis not present

## 2014-10-21 MED ORDER — METHYLPHENIDATE HCL 20 MG PO TABS: 20.0000 mg | ORAL_TABLET | Freq: Every day | ORAL | Status: DC

## 2014-10-21 NOTE — Patient Instructions (Signed)

## 2014-10-21 NOTE — Progress Notes (Signed)
Reason for visit: Multiple sclerosis  Summer Henderson is an 54 y.o. female  History of present illness:  Summer Henderson is a 54 year old right-handed white female with a history of multiple sclerosis. The patient indicates that she had a coronary artery stent placement in February of 2016. She was having significant issues with fatigue and shortness of breath prior to this. The procedure significantly helped her fatigue and shortness of breath. She still has some baseline fatigue associated with the multiple sclerosis. She quit smoking in February as well. The patient believes that her balance has gradually worsened over time, she denies any falls. She is now working with a Psychologist, educational with exercise, and they are working on some Location manager. She denies any new numbness or weakness of the face, arms, or legs. She denies any vision changes or swallowing problems. There have been no changes in bowel or bladder control. She is on Tecfidera, she has occasional hot flashes with this, but overall she is tolerating the medication well.  Past Medical History  Diagnosis Date  . Multiple sclerosis   . Chronic fatigue   . Cervical dysplasia   . GERD (gastroesophageal reflux disease)   . Carpal tunnel syndrome   . Prolonged QT interval   . Hypertension   . Mitral valve prolapse   . Anxiety   . Arthritis   . Depression   . Pneumonia   . CAD (coronary artery disease)     a. 06/2014 abnl MV;  b. 06/2014 Cath: LM nl, LAD 89m (2.75x28 Promus DES), LCX nl, RCA 60-43m (FFR = 0.80 -> 3.0x24 Promus DES), EF 55-65%.    Past Surgical History  Procedure Laterality Date  . Rectovaginal fistula repair    . Abdominal hysterectomy    . Dilation and curettage of uterus    . Foot surgery Bilateral     For bunions  . Carpal tunnel release    . Bladder resuspension    . Hammer toe surgery Left   . Cervical spine surgery  10-16-2009  . Orif toe fracture Right   . Biceps tendon repair    . Prolong qtc      Heart    . Left heart catheterization with coronary angiogram N/A 06/29/2014    Procedure: LEFT HEART CATHETERIZATION WITH CORONARY ANGIOGRAM;  Surgeon: Micheline Chapman, MD;  Location: [facility] CATH LAB;  Service: Cardiovascular;  Laterality: N/A;    Family History  Problem Relation Age of Onset  . Melanoma Mother   . Breast cancer Maternal Grandmother   . Brain cancer Maternal Grandfather   . Lung cancer Maternal Grandfather   . Colon cancer Maternal Grandmother   . Colon polyps Mother   . Diabetes Mother   . Diabetes Maternal Grandfather   . Kidney disease Neg Hx   . Esophageal cancer Neg Hx   . Gallbladder disease Neg Hx     Social history:  reports that she quit smoking about 3 months ago. Her smoking use included Cigarettes. She has a 35 pack-year smoking history. She has never used smokeless tobacco. She reports that she does not drink alcohol or use illicit drugs.    Allergies  Allergen Reactions  . Demerol [Meperidine]     Other reaction(s): Other (See Comments) [derm] rash, swelling, itching  . Erythromycin   . Morphine And Related   . Sulfa Antibiotics   . Diflucan [Fluconazole]   . Other     Other reaction(s): Other (See Comments) Z-pak causes EKG changes,  was told by cardiologist not to take this.  . Sulfacetamide Sodium   . Erythromycin Base Rash    [Derm] rash  . Fesoterodine Palpitations  . Tape     Adhesive tape  . Toviaz [Fesoterodine Fumarate Er] Palpitations    Medications:  Prior to Admission medications   Medication Sig Start Date End Date Taking? Authorizing Provider  aspirin 81 MG tablet Take 1 tablet (81 mg total) by mouth daily. 07/24/14  Yes Lewayne Bunting, MD  atorvastatin (LIPITOR) 40 MG tablet Take 1 tablet (40 mg total) by mouth daily at 6 PM. 08/14/14  Yes Lewayne Bunting, MD  clonazePAM (KLONOPIN) 0.25 MG disintegrating tablet Take 1 tablet (0.25 mg total) by mouth 2 (two) times daily as needed. 10/11/12  Yes York Spaniel, MD  Dimethyl Fumarate  (TECFIDERA) 240 MG CPDR Take 1 capsule (240 mg total) by mouth 2 (two) times daily. 06/20/14  Yes York Spaniel, MD  DULoxetine (CYMBALTA) 60 MG capsule Take 1 capsule (60 mg total) by mouth 2 (two) times daily. Patient taking differently: Take 60 mg by mouth daily.  07/03/13  Yes York Spaniel, MD  ENABLEX 7.5 MG 24 hr tablet Take 1 tablet by mouth daily. 06/18/14  Yes Historical Provider, MD  esomeprazole (NEXIUM) 40 MG capsule Take 1 capsule (40 mg total) by mouth daily at 12 noon. 07/24/14  Yes Lewayne Bunting, MD  gabapentin (NEURONTIN) 100 MG capsule Take 100 mg by mouth as needed.   Yes Historical Provider, MD  losartan-hydrochlorothiazide (HYZAAR) 100-12.5 MG per tablet Take 1 tablet by mouth daily. 08/12/14  Yes Lewayne Bunting, MD  methylphenidate (RITALIN) 20 MG tablet Take 1 tablet (20 mg total) by mouth daily. At noon 09/28/14  Yes Huston Foley, MD  metoprolol succinate (TOPROL-XL) 50 MG 24 hr tablet Take 1 tablet (50 mg total) by mouth daily. 08/17/14  Yes Lewayne Bunting, MD  modafinil (PROVIGIL) 200 MG tablet Take 1 tablet (200 mg total) by mouth daily. 07/27/14  Yes York Spaniel, MD  NASONEX 50 MCG/ACT nasal spray Place 50 sprays into the nose daily. 09/16/12  Yes Historical Provider, MD  oxyCODONE-acetaminophen (PERCOCET/ROXICET) 5-325 MG per tablet Take 2 tablets by mouth every 4 (four) hours as needed for severe pain. 04/20/14  Yes Elson Areas, PA-C  ticagrelor (BRILINTA) 90 MG TABS tablet Take 1 tablet (90 mg total) by mouth 2 (two) times daily. 08/12/14  Yes Lewayne Bunting, MD    ROS:  Out of a complete 14 system review of symptoms, the patient complains only of the following symptoms, and all other reviewed systems are negative.  Fatigue Leg swelling Constipation Environmental allergies Incontinence of the bladder, frequency of urination Bruising easily  Blood pressure 117/83, pulse 76, height  (1.575 m), weight 173 lb 9.6 oz (78.744 kg).  Physical  Exam  General: The patient is alert and cooperative at the time of the examination. The patient is moderately obese.  Skin: No significant peripheral edema is noted.   Neurologic Exam  Mental status: The patient is alert and oriented x 3 at the time of the examination. The patient has apparent normal recent and remote memory, with an apparently normal attention span and concentration ability.   Cranial nerves: Facial symmetry is present. Speech is normal, no aphasia or dysarthria is noted. Extraocular movements are full. Visual fields are full. Pupils are equal, round, and reactive to light. Discs are flat bilaterally.  Motor: The patient has  good strength in all 4 extremities.  Sensory examination: Soft touch sensation is symmetric on the face, arms, and legs.  Coordination: The patient has good finger-nose-finger and heel-to-shin bilaterally.  Gait and station: The patient has a normal gait. Tandem gait is unsteady. Romberg is positive, the patient tends to fall backwards. No drift is seen.  Reflexes: Deep tendon reflexes are symmetric.   Assessment/Plan:  1. Multiple sclerosis  2. Chronic fatigue  3. Gait disorder  The patient overall is doing well on the Tecfidera. She will continue the medication for now. We will check blood work today, and the patient will follow-up in 6 months. A prescription was given for Ritalin.  Marlan Palau MD 10/21/2014 7:43 PM  [facility] [address] [place], [place] [address]  Phone [phone] Fax [phone]

## 2014-10-22 ENCOUNTER — Telehealth: Payer: Self-pay | Admitting: Neurology

## 2014-10-22 LAB — COMPREHENSIVE METABOLIC PANEL
ALBUMIN: 4.5 g/dL (ref 3.5–5.5)
ALK PHOS: 137 IU/L — AB (ref 39–117)
ALT: 35 IU/L — ABNORMAL HIGH (ref 0–32)
AST: 33 IU/L (ref 0–40)
Albumin/Globulin Ratio: 1.7 (ref 1.1–2.5)
BILIRUBIN TOTAL: 0.5 mg/dL (ref 0.0–1.2)
BUN / CREAT RATIO: 10 (ref 9–23)
BUN: 9 mg/dL (ref 6–24)
CHLORIDE: 99 mmol/L (ref 97–108)
CO2: 26 mmol/L (ref 18–29)
Calcium: 9.9 mg/dL (ref 8.7–10.2)
Creatinine, Ser: 0.87 mg/dL (ref 0.57–1.00)
GFR calc non Af Amer: 77 mL/min/{1.73_m2} (ref >59)
GFR, EST AFRICAN AMERICAN: 89 mL/min/{1.73_m2} (ref >59)
Globulin, Total: 2.6 g/dL (ref 1.5–4.5)
Glucose: 101 mg/dL — ABNORMAL HIGH (ref 65–99)
Potassium: 5.2 mmol/L (ref 3.5–5.2)
Sodium: 139 mmol/L (ref 134–144)
Total Protein: 7.1 g/dL (ref 6.0–8.5)

## 2014-10-22 LAB — CBC WITH DIFFERENTIAL/PLATELET
BASOS ABS: 0 10*3/uL (ref 0.0–0.2)
Basos: 0 %
EOS (ABSOLUTE): 0.1 10*3/uL (ref 0.0–0.4)
Eos: 1 %
HEMATOCRIT: 38.6 % (ref 34.0–46.6)
Hemoglobin: 12.9 g/dL (ref 11.1–15.9)
IMMATURE GRANS (ABS): 0 10*3/uL (ref 0.0–0.1)
Immature Granulocytes: 0 %
Lymphocytes Absolute: 2.4 10*3/uL (ref 0.7–3.1)
Lymphs: 32 %
MCH: 28.7 pg (ref 26.6–33.0)
MCHC: 33.4 g/dL (ref 31.5–35.7)
MCV: 86 fL (ref 79–97)
MONOCYTES: 7 %
Monocytes Absolute: 0.6 10*3/uL (ref 0.1–0.9)
NEUTROS ABS: 4.4 10*3/uL (ref 1.4–7.0)
Neutrophils: 60 %
PLATELETS: 455 10*3/uL — AB (ref 150–379)
RBC: 4.49 x10E6/uL (ref 3.77–5.28)
RDW: 16.7 % — AB (ref 12.3–15.4)
WBC: 7.5 10*3/uL (ref 3.4–10.8)

## 2014-10-22 NOTE — Telephone Encounter (Signed)
I called patient. The blood work is relatively unremarkable. Mild stable elevation of the alk phosphatase level, ALT is minimally elevated, not clinically significant. The platelet level is slightly elevated. We will continue to follow the blood work.

## 2014-11-30 ENCOUNTER — Other Ambulatory Visit: Payer: Self-pay | Admitting: Neurology

## 2014-11-30 DIAGNOSIS — G35 Multiple sclerosis: Secondary | ICD-10-CM

## 2014-11-30 MED ORDER — METHYLPHENIDATE HCL 20 MG PO TABS: 20.0000 mg | ORAL_TABLET | Freq: Every day | ORAL | Status: DC

## 2014-11-30 NOTE — Telephone Encounter (Signed)
Request entered, forwarded to provider for approval.  

## 2014-11-30 NOTE — Telephone Encounter (Signed)
Patient called requesting refill for methylphenidate (RITALIN) 20 MG tablet . Patient advised RX will be ready within 24 hours unless otherwise informed by RN. °

## 2014-12-01 ENCOUNTER — Telehealth: Payer: Self-pay

## 2014-12-01 NOTE — Telephone Encounter (Signed)
Rx ready for pick up. 

## 2015-01-06 ENCOUNTER — Other Ambulatory Visit: Payer: Self-pay | Admitting: Neurology

## 2015-01-06 DIAGNOSIS — G35 Multiple sclerosis: Secondary | ICD-10-CM

## 2015-01-06 MED ORDER — METHYLPHENIDATE HCL 20 MG PO TABS
20.0000 mg | ORAL_TABLET | Freq: Every day | ORAL | Status: DC
Start: 2015-01-06 — End: 2015-04-06

## 2015-01-06 NOTE — Telephone Encounter (Signed)
Request entered, forwarded to provider for approval.  

## 2015-01-06 NOTE — Telephone Encounter (Signed)
Patient called to request refill methylphenidate (RITALIN) 20 MG tablet, advised will be ready within 24hrs unless hears from nurse otherwise

## 2015-01-07 ENCOUNTER — Telehealth: Payer: Self-pay

## 2015-01-07 ENCOUNTER — Other Ambulatory Visit: Payer: Self-pay | Admitting: Neurology

## 2015-01-07 NOTE — Telephone Encounter (Signed)
Rx ready for pick up. 

## 2015-01-21 ENCOUNTER — Other Ambulatory Visit: Payer: Self-pay | Admitting: Neurology

## 2015-01-21 ENCOUNTER — Telehealth: Payer: Self-pay | Admitting: Neurology

## 2015-01-21 NOTE — Telephone Encounter (Signed)
Patient called and is requesting call from nurse. Patient wants to know why she can't get her medicine refilled.

## 2015-01-21 NOTE — Telephone Encounter (Signed)
Heather at [facility] [address] in [place] is calling regarding Rx DULoxetine (CYMBALTA) 60 MG capsule for the patient. Herbert Seta states they never received Rx and the patient is out of medication. Please fax to [phone]. Thank you.

## 2015-01-21 NOTE — Telephone Encounter (Signed)
I called and spoke to the patient. She is very upset that she has waited 13 days for her Cymbalta. I tried to apologize and explain that we received confirmation from the pharmacy on 01/08/15. I also tried to explain that Shanda Bumps resent the Rx this morning. I told the patient that I was going to call the pharmacy and figure out what was going on. I called [facility] [address] in [address] and spoke to [address] who stated they do not have the Rx. I gave her a verbal order for Cymbalta 60 mg twice daily 180 capsules with no refills. Maleigha read the order back to me correctly. I called the patient to let her know the pharmacy will call her when the Rx is ready to be picked up today. She stated she would no longer take the medication after this time if it is going to be such a hassle and hung up on me.

## 2015-02-22 ENCOUNTER — Other Ambulatory Visit: Payer: Self-pay

## 2015-02-22 MED ORDER — DIMETHYL FUMARATE 240 MG PO CPDR: 240.0000 mg | DELAYED_RELEASE_CAPSULE | Freq: Two times a day (BID) | ORAL | Status: DC

## 2015-03-17 ENCOUNTER — Other Ambulatory Visit: Payer: Self-pay

## 2015-03-17 DIAGNOSIS — R9431 Abnormal electrocardiogram [ECG] [EKG]: Secondary | ICD-10-CM

## 2015-03-17 MED ORDER — METOPROLOL SUCCINATE ER 50 MG PO TB24: 50.0000 mg | ORAL_TABLET | Freq: Every day | ORAL | Status: DC

## 2015-04-06 ENCOUNTER — Telehealth: Payer: Self-pay

## 2015-04-06 ENCOUNTER — Other Ambulatory Visit: Payer: Self-pay | Admitting: Neurology

## 2015-04-06 DIAGNOSIS — G35 Multiple sclerosis: Secondary | ICD-10-CM

## 2015-04-06 MED ORDER — METHYLPHENIDATE HCL 20 MG PO TABS: 20.0000 mg | ORAL_TABLET | Freq: Every day | ORAL | Status: DC

## 2015-04-06 NOTE — Telephone Encounter (Signed)
Pt called requesting refill for methylphenidate (RITALIN) 20 MG tablet . Pt advised RX will be ready within 24hrs unless otherwise informed by RN

## 2015-04-06 NOTE — Telephone Encounter (Signed)
Request entered, forwarded to provider for approval.  

## 2015-04-06 NOTE — Telephone Encounter (Signed)
Rx ready for pick up. 

## 2015-04-13 NOTE — Progress Notes (Signed)
HPI: FU CAD, hypertension, prolonged QT and mitral valve prolapse. Previously followed in [facility]. Holter monitor in September of 2012 showed sinus rhythm with rare PVC. Echocardiogram in June of 2015 showed normal LV function and mild left atrial enlargement. There was trace mitral regurgitation. Patient had an abdominal CT for abdominal pain in December 2015. 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 Nuclear study January 2016 showed an ejection fraction of 56%, probable mild breast attenuation with possible mild mid anteroseptal ischemia. Cardiac catheterization February 2016 showed 80% LAD and 60-70% RCA. Ejection fraction 55-65%. The patient had PCI of the LAD and RCA; ultrasound March 2016 showed no DVT. Since she was last seen, the patient denies any dyspnea on exertion, orthopnea, PND, pedal edema, palpitations, syncope or chest pain.   Current Outpatient Prescriptions  Medication Sig Dispense Refill  . aspirin 81 MG tablet Take 1 tablet (81 mg total) by mouth daily. 30 tablet   . atorvastatin (LIPITOR) 40 MG tablet Take 1 tablet (40 mg total) by mouth daily at 6 PM. 90 tablet 3  . clonazePAM (KLONOPIN) 0.25 MG disintegrating tablet Take 1 tablet (0.25 mg total) by mouth 2 (two) times daily as needed. 60 tablet 1  . Dimethyl Fumarate (TECFIDERA) 240 MG CPDR Take 1 capsule (240 mg total) by mouth 2 (two) times daily. 180 capsule 0  . DULoxetine (CYMBALTA) 60 MG capsule TAKE 1 CAPSULE (60 MG TOTAL) BY MOUTH 2 (TWO) TIMES DAILY. 180 capsule 1  . ENABLEX 7.5 MG 24 hr tablet Take 1 tablet by mouth daily.    Marland Kitchen esomeprazole (NEXIUM) 40 MG capsule Take 1 capsule (40 mg total) by mouth daily at 12 noon. 30 capsule 11  . gabapentin (NEURONTIN) 100 MG capsule Take 100 mg by mouth as needed.    Marland Kitchen losartan-hydrochlorothiazide (HYZAAR) 100-12.5 MG per tablet Take 1 tablet by mouth daily. 90 tablet 3  . methylphenidate (RITALIN) 20 MG tablet  Take 1 tablet (20 mg total) by mouth daily. At noon 30 tablet 0  . metoprolol succinate (TOPROL-XL) 50 MG 24 hr tablet Take 1 tablet (50 mg total) by mouth daily. 90 tablet 1  . modafinil (PROVIGIL) 200 MG tablet Take 1 tablet (200 mg total) by mouth daily. 30 tablet 5  . NASONEX 50 MCG/ACT nasal spray Place 50 sprays into the nose daily.    Marland Kitchen oxyCODONE-acetaminophen (PERCOCET/ROXICET) 5-325 MG per tablet Take 2 tablets by mouth every 4 (four) hours as needed for severe pain. 20 tablet 0  . ticagrelor (BRILINTA) 90 MG TABS tablet Take 1 tablet (90 mg total) by mouth 2 (two) times daily. 60 tablet 6   No current facility-administered medications for this visit.     Past Medical History  Diagnosis Date  . Multiple sclerosis (HCC)   . Chronic fatigue   . Cervical dysplasia   . GERD (gastroesophageal reflux disease)   . Carpal tunnel syndrome   . Prolonged QT interval   . Hypertension   . Mitral valve prolapse   . Anxiety   . Arthritis   . Depression   . Pneumonia   . CAD (coronary artery disease)     a. 06/2014 abnl MV;  b. 06/2014 Cath: LM nl, LAD 57m (2.75x28 Promus DES), LCX nl, RCA 60-70m (FFR = 0.80 -> 3.0x24 Promus DES), EF 55-65%.    Past Surgical History  Procedure Laterality Date  . Rectovaginal fistula repair    . Abdominal hysterectomy    .  Dilation and curettage of uterus    . Foot surgery Bilateral     For bunions  . Carpal tunnel release    . Bladder resuspension    . Hammer toe surgery Left   . Cervical spine surgery  10-16-2009  . Orif toe fracture Right   . Biceps tendon repair    . Prolong qtc      Heart  . Left heart catheterization with coronary angiogram N/A 06/29/2014    Procedure: LEFT HEART CATHETERIZATION WITH CORONARY ANGIOGRAM;  Surgeon: Micheline Chapman, MD;  Location: [facility] CATH LAB;  Service: Cardiovascular;  Laterality: N/A;    Social History   Social History  . Marital Status: Divorced    Spouse Name: N/A  . Number of Children: 2  . Years of  Education: 13   Occupational History  . Disabled     Disability   Social History Main Topics  . Smoking status: Former Smoker -- 1.00 packs/day for 35 years    Types: Cigarettes    Quit date: 06/30/2014  . Smokeless tobacco: Never Used  . Alcohol Use: No  . Drug Use: No  . Sexual Activity: Not on file   Other Topics Concern  . Not on file   Social History Narrative   Patient does not drink caffeine.   Patient is right handed.    ROS: no fevers or chills, productive cough, hemoptysis, dysphasia, odynophagia, melena, hematochezia, dysuria, hematuria, rash, seizure activity, orthopnea, PND, pedal edema, claudication. Remaining systems are negative.  Physical Exam: Well-developed well-nourished in no acute distress.  Skin is warm and dry.  HEENT is normal.  Neck is supple.  Chest is clear to auscultation with normal expansion.  Cardiovascular exam is regular rate and rhythm.  Abdominal exam nontender or distended. No masses palpated. Extremities show no edema. neuro grossly intact  ECG Sinus rhythm at a rate of 81. Normal axis. Nonspecific ST changes. Mildly prolonged QT interval.

## 2015-04-14 ENCOUNTER — Ambulatory Visit (INDEPENDENT_AMBULATORY_CARE_PROVIDER_SITE_OTHER): Payer: Medicare Other | Admitting: Cardiology

## 2015-04-14 ENCOUNTER — Encounter: Payer: Self-pay | Admitting: Cardiology

## 2015-04-14 VITALS — BP 138/87 | HR 84 | Ht 62.0 in | Wt 172.1 lb

## 2015-04-14 DIAGNOSIS — Z9861 Coronary angioplasty status: Secondary | ICD-10-CM

## 2015-04-14 DIAGNOSIS — I2510 Atherosclerotic heart disease of native coronary artery without angina pectoris: Secondary | ICD-10-CM

## 2015-04-14 DIAGNOSIS — R9431 Abnormal electrocardiogram [ECG] [EKG]: Secondary | ICD-10-CM

## 2015-04-14 DIAGNOSIS — E785 Hyperlipidemia, unspecified: Secondary | ICD-10-CM | POA: Diagnosis not present

## 2015-04-14 DIAGNOSIS — Z720 Tobacco use: Secondary | ICD-10-CM | POA: Diagnosis not present

## 2015-04-14 DIAGNOSIS — Z79899 Other long term (current) drug therapy: Secondary | ICD-10-CM

## 2015-04-14 DIAGNOSIS — I4581 Long QT syndrome: Secondary | ICD-10-CM | POA: Diagnosis not present

## 2015-04-14 LAB — HEPATIC FUNCTION PANEL
ALK PHOS: 127 U/L (ref 33–130)
ALT: 19 U/L (ref 6–29)
AST: 20 U/L (ref 10–35)
Albumin: 3.9 g/dL (ref 3.6–5.1)
BILIRUBIN DIRECT: 0.1 mg/dL (ref <=0.2)
BILIRUBIN INDIRECT: 0.4 mg/dL (ref 0.2–1.2)
Total Bilirubin: 0.5 mg/dL (ref 0.2–1.2)
Total Protein: 6.9 g/dL (ref 6.1–8.1)

## 2015-04-14 LAB — BASIC METABOLIC PANEL
BUN: 5 mg/dL — AB (ref 7–25)
CHLORIDE: 99 mmol/L (ref 98–110)
CO2: 22 mmol/L (ref 20–31)
Calcium: 9.2 mg/dL (ref 8.6–10.4)
Creat: 0.67 mg/dL (ref 0.50–1.05)
GLUCOSE: 82 mg/dL (ref 65–99)
POTASSIUM: 3.5 mmol/L (ref 3.5–5.3)
Sodium: 135 mmol/L (ref 135–146)

## 2015-04-14 NOTE — Assessment & Plan Note (Signed)
Continue statin. Check lipids and liver. 

## 2015-04-14 NOTE — Assessment & Plan Note (Signed)
No history of syncope. ?

## 2015-04-14 NOTE — Assessment & Plan Note (Signed)
Blood pressure controlled. Continue present medications. Check potassium and renal function. 

## 2015-04-14 NOTE — Patient Instructions (Signed)
Medication Instructions:  STOP Brilinta on March 1st, 2017.  Labwork: Your physician recommends that you return for lab work TODAY.  Testing/Procedures: NONE  Follow-Up: Dr Jens Som recommends that you schedule a follow-up appointment in 6 months. You will receive a reminder letter in the mail two months in advance. If you don't receive a letter, please call our office to schedule the follow-up appointment.  If you need a refill on your cardiac medications before your next appointment, please call your pharmacy.

## 2015-04-14 NOTE — Assessment & Plan Note (Signed)
Patient counseled on discontinuing. 

## 2015-04-14 NOTE — Assessment & Plan Note (Signed)
Continue aspirin, brilinta, statin; DC brilinta 07/07/15.

## 2015-04-28 ENCOUNTER — Encounter: Payer: Self-pay | Admitting: Neurology

## 2015-04-28 ENCOUNTER — Ambulatory Visit (INDEPENDENT_AMBULATORY_CARE_PROVIDER_SITE_OTHER): Payer: Medicare Other | Admitting: Neurology

## 2015-04-28 VITALS — BP 130/81 | HR 81 | Ht 62.0 in | Wt 175.5 lb

## 2015-04-28 DIAGNOSIS — G35 Multiple sclerosis: Secondary | ICD-10-CM | POA: Diagnosis not present

## 2015-04-28 DIAGNOSIS — G2581 Restless legs syndrome: Secondary | ICD-10-CM | POA: Insufficient documentation

## 2015-04-28 DIAGNOSIS — R269 Unspecified abnormalities of gait and mobility: Secondary | ICD-10-CM | POA: Diagnosis not present

## 2015-04-28 DIAGNOSIS — Z5181 Encounter for therapeutic drug level monitoring: Secondary | ICD-10-CM | POA: Diagnosis not present

## 2015-04-28 HISTORY — DX: Restless legs syndrome: G25.81

## 2015-04-28 MED ORDER — METHYLPHENIDATE HCL 20 MG PO TABS: 20.0000 mg | ORAL_TABLET | Freq: Every day | ORAL | Status: DC

## 2015-04-28 NOTE — Progress Notes (Signed)
Reason for visit: Multiple sclerosis  Summer Henderson is an 54 y.o. female  History of present illness:  Summer Henderson is a 54 year old right-handed white female with a history of multiple sclerosis. The patient is on Tecfidera, she is tolerating this drug fairly well, she will have occasional hot flashes at times. The patient has had recent blood work done that included a liver profile that was unremarkable, but a CBC was not done. The patient last had MRI evaluation the brain in June 2014. She is having some new issues. She has noted that she is having some brief episodes of dizziness and a feeling of instability when she first stands up. She will note this if she has been sitting a long time, she generally does not have problems when she first gets up in the morning. The patient has not had any falls because of this. She did fall on 2 occasions in the summertime associated with significant heat exposure. The patient does have some gait instability, but she has not been regularly using a cane or walking stick when she is outside the house. She indicates that she has difficulty maintaining balance when she is standing still. She has a neurogenic bladder, she takes medication for this. She also reports some issues with restless leg syndrome that have been bothersome for her recently. She has gabapentin at home that she no longer uses. She was using this for neuromuscular pain. The patient continues to note some chronic issues with fatigue. She has gone back to smoking cigarettes again.  Past Medical History  Diagnosis Date  . Multiple sclerosis (HCC)   . Chronic fatigue   . Cervical dysplasia   . GERD (gastroesophageal reflux disease)   . Carpal tunnel syndrome   . Prolonged QT interval   . Hypertension   . Mitral valve prolapse   . Anxiety   . Arthritis   . Depression   . Pneumonia   . CAD (coronary artery disease)     a. 06/2014 abnl MV;  b. 06/2014 Cath: LM nl, LAD 56m (2.75x28 Promus DES),  LCX nl, RCA 60-75m (FFR = 0.80 -> 3.0x24 Promus DES), EF 55-65%.  Marland Kitchen RLS (restless legs syndrome) 04/28/2015    Past Surgical History  Procedure Laterality Date  . Rectovaginal fistula repair    . Abdominal hysterectomy    . Dilation and curettage of uterus    . Foot surgery Bilateral     For bunions  . Carpal tunnel release    . Bladder resuspension    . Hammer toe surgery Left   . Cervical spine surgery  10-16-2009  . Orif toe fracture Right   . Biceps tendon repair    . Prolong qtc      Heart  . Left heart catheterization with coronary angiogram N/A 06/29/2014    Procedure: LEFT HEART CATHETERIZATION WITH CORONARY ANGIOGRAM;  Surgeon: Micheline Chapman, MD;  Location[facility] CATH LAB;  Service: Cardiovascular;  Laterality: N/A;    Family History  Problem Relation Age of Onset  . Melanoma Mother   . Breast cancer Maternal Grandmother   . Brain cancer Maternal Grandfather   . Lung cancer Maternal Grandfather   . Colon cancer Maternal Grandmother   . Colon polyps Mother   . Diabetes Mother   . Diabetes Maternal Grandfather   . Kidney disease Neg Hx   . Esophageal cancer Neg Hx   . Gallbladder disease Neg Hx     Social history:  reports  that she has been smoking Cigarettes.  She has a 17.5 pack-year smoking history. She has never used smokeless tobacco. She reports that she does not drink alcohol or use illicit drugs.    Allergies  Allergen Reactions  . Demerol [Meperidine] Other (See Comments)    [derm] rash, swelling, itching Other reaction(s): Other (See Comments) [derm] rash, swelling, itching  . Erythromycin   . Morphine And Related   . Sulfa Antibiotics   . Diflucan [Fluconazole] Other (See Comments)    States she has prolonged QT and cannot take this.  . Other     Other reaction(s): Other (See Comments) Z-pak causes EKG changes, was told by cardiologist not to take this.  . Sulfacetamide Sodium   . Erythromycin Base Rash    [Derm] rash  . Fesoterodine  Palpitations  . Tape     Adhesive tape  . Toviaz [Fesoterodine Fumarate Er] Palpitations    Medications:  Prior to Admission medications   Medication Sig Start Date End Date Taking? Authorizing Provider  aspirin 81 MG tablet Take 1 tablet (81 mg total) by mouth daily. 07/24/14  Yes Lewayne Bunting, MD  atorvastatin (LIPITOR) 40 MG tablet Take 1 tablet (40 mg total) by mouth daily at 6 PM. 08/14/14  Yes Lewayne Bunting, MD  clonazePAM (KLONOPIN) 0.25 MG disintegrating tablet Take 1 tablet (0.25 mg total) by mouth 2 (two) times daily as needed. 10/11/12  Yes York Spaniel, MD  Dimethyl Fumarate (TECFIDERA) 240 MG CPDR Take 1 capsule (240 mg total) by mouth 2 (two) times daily. 02/22/15  Yes York Spaniel, MD  DULoxetine (CYMBALTA) 60 MG capsule TAKE 1 CAPSULE (60 MG TOTAL) BY MOUTH 2 (TWO) TIMES DAILY. 01/21/15  Yes York Spaniel, MD  ENABLEX 7.5 MG 24 hr tablet Take 1 tablet by mouth daily. 06/18/14  Yes Historical Provider, MD  esomeprazole (NEXIUM) 40 MG capsule Take 1 capsule (40 mg total) by mouth daily at 12 noon. 07/24/14  Yes Lewayne Bunting, MD  gabapentin (NEURONTIN) 100 MG capsule Take 100 mg by mouth as needed.   Yes Historical Provider, MD  losartan-hydrochlorothiazide (HYZAAR) 100-12.5 MG per tablet Take 1 tablet by mouth daily. 08/12/14  Yes Lewayne Bunting, MD  methylphenidate (RITALIN) 20 MG tablet Take 1 tablet (20 mg total) by mouth daily. At noon 04/06/15  Yes York Spaniel, MD  metoprolol succinate (TOPROL-XL) 50 MG 24 hr tablet Take 1 tablet (50 mg total) by mouth daily. 03/17/15  Yes Lewayne Bunting, MD  modafinil (PROVIGIL) 200 MG tablet Take 1 tablet (200 mg total) by mouth daily. 07/27/14  Yes York Spaniel, MD  NASONEX 50 MCG/ACT nasal spray Place 50 sprays into the nose daily. 09/16/12  Yes Historical Provider, MD  oxyCODONE-acetaminophen (PERCOCET/ROXICET) 5-325 MG per tablet Take 2 tablets by mouth every 4 (four) hours as needed for severe pain. 04/20/14  Yes  Elson Areas, PA-C  ticagrelor (BRILINTA) 90 MG TABS tablet Take 1 tablet (90 mg total) by mouth 2 (two) times daily. 08/12/14  Yes Lewayne Bunting, MD    ROS:  Out of a complete 14 system review of symptoms, the patient complains only of the following symptoms, and all other reviewed systems are negative.  Fatigue Excessive thirst Incontinence of bladder, frequency of urination, urinary urgency Joint pain Restless legs Bruising easily Dizziness, numbness, weakness, tremors Depression, anxiety  Blood pressure 130/81, pulse 81, height  (1.575 m), weight 175 lb 8 oz (79.606 kg).  Blood pressure, left arm, sitting is 124/80. Blood pressure, left arm, standing is 118 systolic.  Physical Exam  General: The patient is alert and cooperative at the time of the examination. The patient is moderately obese.  Skin: No significant peripheral edema is noted.   Neurologic Exam  Mental status: The patient is alert and oriented x 3 at the time of the examination. The patient has apparent normal recent and remote memory, with an apparently normal attention span and concentration ability.   Cranial nerves: Facial symmetry is present. Speech is normal, no aphasia or dysarthria is noted. Extraocular movements are full. Visual fields are full. Pupils are equal, round, and reactive to light. Discs are flat bilaterally.  Motor: The patient has good strength in all 4 extremities.  Sensory examination: Soft touch sensation is symmetric on the face, arms, and legs.  Coordination: The patient has good finger-nose-finger and heel-to-shin bilaterally.  Gait and station: The patient has a slightly wide-based, unsteady gait. Tandem gait is unsteady. Romberg is negative, but is unsteady.  Reflexes: Deep tendon reflexes are symmetric.   Assessment/Plan:  1. Multiple sclerosis  2. Reported dizziness with standing  3. Restless leg syndrome  4. Gait disturbance  5. Chronic fatigue  6.  Neurogenic bladder  The patient has gabapentin at home that can be used for her restless leg syndrome. The patient will take 100 mg at night if needed. She was given a prescription for her Ritalin today for her chronic fatigue. She will remain on Tecfidera, blood work will be done today to include a CBC. We will plan on a repeat MRI of the brain when next seen in the summer of 2017. She will follow-up in 6 months.   Marlan Palau MD 04/28/2015 6:49 PM  [facility] [address] [place], [place] [address]  Phone [phone] Fax [phone]

## 2015-04-28 NOTE — Patient Instructions (Signed)
Multiple Sclerosis °Multiple sclerosis (MS) is a disease of the central nervous system. It leads to the loss of the insulating covering of the nerves (myelin sheath) of your brain. When this happens, brain signals do not get sent properly or may not get sent at all. The age of onset of MS varies.  °CAUSES °The cause of MS is unknown. However, it is more common in the [place] than in the southern [place]. °RISK FACTORS °There is a higher number of women with MS than men. MS is not an illness that is passed down to you from your family members (inherited). However, your risk of MS is higher if you have a relative with MS. °SIGNS AND SYMPTOMS  °The symptoms of MS occur in episodes or attacks. These attacks may last weeks to months. There may be long periods of almost no symptoms between attacks. The symptoms of MS vary. This is because of the many different ways it affects the central nervous system. The main symptoms of MS include: °· Vision problems and eye pain. °· Numbness. °· Weakness. °· Inability to move your arms, hands, feet, or legs (paralysis). °· Balance problems. °· Tremors. °DIAGNOSIS  °Your health care provider can diagnose MS with the help of imaging exams and lab tests. These may include specialized X-ray exams and spinal fluid tests. The best imaging exam to confirm a diagnosis of MS is an MRI. °TREATMENT  °There is no known cure for MS, but there are medicines that can decrease the number and frequency of attacks. Steroids are often used for short-term relief. Physical and occupational therapy may also help. There are also many new alternative or complementary treatments available to help control the symptoms of MS. Ask your health care provider if any of these other options are right for you. °HOME CARE INSTRUCTIONS  °· Take medicines as directed by your health care provider. °· Exercise as directed by your health care provider. °SEEK MEDICAL CARE IF: °You begin to feel  depressed. °SEEK IMMEDIATE MEDICAL CARE IF: °· You develop paralysis. °· You have problems with bladder, bowel, or sexual function. °· You develop mental changes, such as forgetfulness or mood swings. °· You have a period of uncontrolled movements (seizure). °  °This information is not intended to replace advice given to you by your health care provider. Make sure you discuss any questions you have with your health care provider. °  °Document Released: 04/21/2000 Document Revised: 04/29/2013 Document Reviewed: 12/30/2012 °Elsevier Interactive Patient Education ©2016 [employer]. ° °

## 2015-04-29 LAB — CBC WITH DIFFERENTIAL/PLATELET
BASOS: 0 %
Basophils Absolute: 0 10*3/uL (ref 0.0–0.2)
EOS (ABSOLUTE): 0.1 10*3/uL (ref 0.0–0.4)
EOS: 1 %
HEMATOCRIT: 36.4 % (ref 34.0–46.6)
HEMOGLOBIN: 11.9 g/dL (ref 11.1–15.9)
Immature Grans (Abs): 0 10*3/uL (ref 0.0–0.1)
Immature Granulocytes: 0 %
LYMPHS ABS: 2.3 10*3/uL (ref 0.7–3.1)
Lymphs: 30 %
MCH: 26.6 pg (ref 26.6–33.0)
MCHC: 32.7 g/dL (ref 31.5–35.7)
MCV: 81 fL (ref 79–97)
MONOCYTES: 6 %
Monocytes Absolute: 0.4 10*3/uL (ref 0.1–0.9)
Neutrophils Absolute: 4.8 10*3/uL (ref 1.4–7.0)
Neutrophils: 63 %
Platelets: 504 10*3/uL — ABNORMAL HIGH (ref 150–379)
RBC: 4.47 x10E6/uL (ref 3.77–5.28)
RDW: 16.4 % — ABNORMAL HIGH (ref 12.3–15.4)
WBC: 7.6 10*3/uL (ref 3.4–10.8)

## 2015-06-15 ENCOUNTER — Telehealth: Payer: Self-pay

## 2015-06-15 ENCOUNTER — Other Ambulatory Visit: Payer: Self-pay | Admitting: Neurology

## 2015-06-15 DIAGNOSIS — G35 Multiple sclerosis: Secondary | ICD-10-CM

## 2015-06-15 MED ORDER — METHYLPHENIDATE HCL 20 MG PO TABS: 20.0000 mg | ORAL_TABLET | Freq: Every day | ORAL | Status: DC

## 2015-06-15 NOTE — Telephone Encounter (Signed)
Rx ready for pick up. 

## 2015-06-15 NOTE — Telephone Encounter (Signed)
Patient called to request refill of methylphenidate (RITALIN) 20 MG tablet °

## 2015-06-16 ENCOUNTER — Telehealth: Payer: Self-pay | Admitting: Neurology

## 2015-06-16 ENCOUNTER — Other Ambulatory Visit: Payer: Self-pay

## 2015-06-16 MED ORDER — DIMETHYL FUMARATE 240 MG PO CPDR: 240.0000 mg | DELAYED_RELEASE_CAPSULE | Freq: Two times a day (BID) | ORAL | Status: DC

## 2015-06-16 NOTE — Telephone Encounter (Signed)
Patient said she needs a refill on the Tecfidero 240 MG and the speciality pharmacy said they faxed something over here but she hasn't heard anything and was wondering what the status is on refill this RX. The best number to contact her is [phone]. She gave me the number to the pharmacy and its [phone].

## 2015-06-16 NOTE — Telephone Encounter (Signed)
Rx sent to [facility]

## 2015-06-21 ENCOUNTER — Telehealth: Payer: Self-pay

## 2015-06-21 NOTE — Telephone Encounter (Signed)
[facility]) has approved the request for coverage on Tecfidera effective until 05/07/2016 Ref # [id number]

## 2015-06-26 ENCOUNTER — Emergency Department (HOSPITAL_BASED_OUTPATIENT_CLINIC_OR_DEPARTMENT_OTHER): Payer: Medicare Other

## 2015-06-26 ENCOUNTER — Emergency Department (HOSPITAL_BASED_OUTPATIENT_CLINIC_OR_DEPARTMENT_OTHER)
Admission: EM | Admit: 2015-06-26 | Discharge: 2015-06-26 | Disposition: A | Discharge status: Home / Self Care | Payer: Medicare Other | Attending: Emergency Medicine | Admitting: Emergency Medicine

## 2015-06-26 ENCOUNTER — Encounter (HOSPITAL_BASED_OUTPATIENT_CLINIC_OR_DEPARTMENT_OTHER): Payer: Self-pay | Admitting: *Deleted

## 2015-06-26 DIAGNOSIS — Z8701 Personal history of pneumonia (recurrent): Secondary | ICD-10-CM | POA: Insufficient documentation

## 2015-06-26 DIAGNOSIS — I2510 Atherosclerotic heart disease of native coronary artery without angina pectoris: Secondary | ICD-10-CM | POA: Insufficient documentation

## 2015-06-26 DIAGNOSIS — M549 Dorsalgia, unspecified: Secondary | ICD-10-CM | POA: Insufficient documentation

## 2015-06-26 DIAGNOSIS — M1990 Unspecified osteoarthritis, unspecified site: Secondary | ICD-10-CM | POA: Diagnosis not present

## 2015-06-26 DIAGNOSIS — F419 Anxiety disorder, unspecified: Secondary | ICD-10-CM | POA: Diagnosis not present

## 2015-06-26 DIAGNOSIS — G2581 Restless legs syndrome: Secondary | ICD-10-CM | POA: Diagnosis not present

## 2015-06-26 DIAGNOSIS — F17210 Nicotine dependence, cigarettes, uncomplicated: Secondary | ICD-10-CM | POA: Insufficient documentation

## 2015-06-26 DIAGNOSIS — Z9861 Coronary angioplasty status: Secondary | ICD-10-CM | POA: Insufficient documentation

## 2015-06-26 DIAGNOSIS — Z8742 Personal history of other diseases of the female genital tract: Secondary | ICD-10-CM | POA: Diagnosis not present

## 2015-06-26 DIAGNOSIS — F329 Major depressive disorder, single episode, unspecified: Secondary | ICD-10-CM | POA: Insufficient documentation

## 2015-06-26 DIAGNOSIS — Z7982 Long term (current) use of aspirin: Secondary | ICD-10-CM | POA: Diagnosis not present

## 2015-06-26 DIAGNOSIS — Z79899 Other long term (current) drug therapy: Secondary | ICD-10-CM | POA: Insufficient documentation

## 2015-06-26 DIAGNOSIS — K219 Gastro-esophageal reflux disease without esophagitis: Secondary | ICD-10-CM | POA: Diagnosis not present

## 2015-06-26 DIAGNOSIS — E876 Hypokalemia: Secondary | ICD-10-CM | POA: Insufficient documentation

## 2015-06-26 DIAGNOSIS — I10 Essential (primary) hypertension: Secondary | ICD-10-CM | POA: Diagnosis not present

## 2015-06-26 DIAGNOSIS — R0602 Shortness of breath: Secondary | ICD-10-CM | POA: Diagnosis present

## 2015-06-26 LAB — CBC
HEMATOCRIT: 34.1 % — AB (ref 36.0–46.0)
Hemoglobin: 11.3 g/dL — ABNORMAL LOW (ref 12.0–15.0)
MCH: 26.6 pg (ref 26.0–34.0)
MCHC: 33.1 g/dL (ref 30.0–36.0)
MCV: 80.2 fL (ref 78.0–100.0)
Platelets: 490 10*3/uL — ABNORMAL HIGH (ref 150–400)
RBC: 4.25 MIL/uL (ref 3.87–5.11)
RDW: 15.1 % (ref 11.5–15.5)
WBC: 8.9 10*3/uL (ref 4.0–10.5)

## 2015-06-26 LAB — BASIC METABOLIC PANEL
Anion gap: 8 (ref 5–15)
BUN: 7 mg/dL (ref 6–20)
CHLORIDE: 100 mmol/L — AB (ref 101–111)
CO2: 27 mmol/L (ref 22–32)
Calcium: 8.6 mg/dL — ABNORMAL LOW (ref 8.9–10.3)
Creatinine, Ser: 0.69 mg/dL (ref 0.44–1.00)
GFR calc Af Amer: >60 mL/min (ref >60)
GFR calc non Af Amer: >60 mL/min (ref >60)
Glucose, Bld: 93 mg/dL (ref 65–99)
POTASSIUM: 2.6 mmol/L — AB (ref 3.5–5.1)
SODIUM: 135 mmol/L (ref 135–145)

## 2015-06-26 LAB — TROPONIN I: Troponin I: <0.03 ng/mL

## 2015-06-26 MED ORDER — POTASSIUM CHLORIDE ER 10 MEQ PO TBCR: 10.0000 meq | EXTENDED_RELEASE_TABLET | Freq: Every day | ORAL | Status: DC

## 2015-06-26 MED ORDER — POTASSIUM CHLORIDE 10 MEQ/100ML IV SOLN
10.0000 meq | Freq: Once | INTRAVENOUS | Status: AC
  Administered 2015-06-26: 10 meq via INTRAVENOUS
  Filled 2015-06-26: qty 100

## 2015-06-26 MED ORDER — POTASSIUM CHLORIDE CRYS ER 20 MEQ PO TBCR
40.0000 meq | EXTENDED_RELEASE_TABLET | Freq: Once | ORAL | Status: AC
  Administered 2015-06-26: 40 meq via ORAL
  Filled 2015-06-26: qty 2

## 2015-06-26 MED ORDER — IBUPROFEN 400 MG PO TABS
600.0000 mg | ORAL_TABLET | Freq: Once | ORAL | Status: AC
  Administered 2015-06-26: 600 mg via ORAL
  Filled 2015-06-26: qty 1

## 2015-06-26 NOTE — ED Provider Notes (Signed)
CSN: [id number]     Arrival date & time 06/26/15  1739 History  By signing my name below, I, Evon Slack, attest that this documentation has been prepared under the direction and in the presence of Jerelyn Scott, MD. Electronically Signed: Evon Slack, ED Scribe. 06/26/2015. 8:03 PM.     Chief Complaint  Patient presents with  . Shortness of Breath   Patient is a 55 y.o. female presenting with shortness of breath. The history is provided by the patient. No language interpreter was used.  Shortness of Breath Severity:  Mild Duration:  1 day Progression:  Resolved Relieved by:  Nothing Associated symptoms: no chest pain    HPI Comments: Camreigh R Barz is a 55 y.o. female who presents to the Emergency Department complaining of resolved SOB onset today. Pt states that her SOB has resolved since falling asleep in the ED. She states that she initially became SOB tonight while she was cooking dinner. Pt is now complaining of mid right sided back pain. Pt states that she has not experienced SOB since having stents places 1 year prior. Denies CP, leg swelling or other related symptoms. Pt reports minor ear surgery 1 day prior.  No leg swelling, no hx of DVT/PE, no chest pain.  No fainting.  States the shortness of breath resolved on its own.  States she has been feeling more tired than usual. There are no other associated systemic symptoms, there are no other alleviating or modifying factors.   Past Medical History  Diagnosis Date  . Multiple sclerosis (HCC)   . Chronic fatigue   . Cervical dysplasia   . GERD (gastroesophageal reflux disease)   . Carpal tunnel syndrome   . Prolonged QT interval   . Hypertension   . Mitral valve prolapse   . Anxiety   . Arthritis   . Depression   . Pneumonia   . CAD (coronary artery disease)     a. 06/2014 abnl MV;  b. 06/2014 Cath: LM nl, LAD 22m (2.75x28 Promus DES), LCX nl, RCA 60-61m (FFR = 0.80 -> 3.0x24 Promus DES), EF 55-65%.  Marland Kitchen RLS (restless  legs syndrome) 04/28/2015   Past Surgical History  Procedure Laterality Date  . Rectovaginal fistula repair    . Abdominal hysterectomy    . Dilation and curettage of uterus    . Foot surgery Bilateral     For bunions  . Carpal tunnel release    . Bladder resuspension    . Hammer toe surgery Left   . Cervical spine surgery  10-16-2009  . Orif toe fracture Right   . Biceps tendon repair    . Prolong qtc      Heart  . Left heart catheterization with coronary angiogram N/A 06/29/2014    Procedure: LEFT HEART CATHETERIZATION WITH CORONARY ANGIOGRAM;  Surgeon: Micheline Chapman, MD;  Location: [facility] CATH LAB;  Service: Cardiovascular;  Laterality: N/A;  . Coronary angioplasty with stent placement     Family History  Problem Relation Age of Onset  . Melanoma Mother   . Breast cancer Maternal Grandmother   . Brain cancer Maternal Grandfather   . Lung cancer Maternal Grandfather   . Colon cancer Maternal Grandmother   . Colon polyps Mother   . Diabetes Mother   . Diabetes Maternal Grandfather   . Kidney disease Neg Hx   . Esophageal cancer Neg Hx   . Gallbladder disease Neg Hx    Social History  Substance Use Topics  . Smoking  status: Current Every Day Smoker -- 0.50 packs/day for 35 years    Types: Cigarettes    Last Attempt to Quit: 06/30/2014  . Smokeless tobacco: Never Used  . Alcohol Use: No   OB History    No data available     Review of Systems  Respiratory: Positive for shortness of breath.   Cardiovascular: Negative for chest pain and leg swelling.  All other systems reviewed and are negative.     Allergies  Demerol; Erythromycin; Morphine and related; Sulfa antibiotics; Diflucan; Other; Sulfacetamide sodium; Erythromycin base; Fesoterodine; Tape; and Toviaz  Home Medications   Prior to Admission medications   Medication Sig Start Date End Date Taking? Authorizing Provider  aspirin 81 MG tablet Take 1 tablet (81 mg total) by mouth daily. 07/24/14  Yes Lewayne Bunting, MD  atorvastatin (LIPITOR) 40 MG tablet Take 1 tablet (40 mg total) by mouth daily at 6 PM. 08/14/14  Yes Lewayne Bunting, MD  Dimethyl Fumarate (TECFIDERA) 240 MG CPDR Take 1 capsule (240 mg total) by mouth 2 (two) times daily. 06/16/15  Yes York Spaniel, MD  DULoxetine (CYMBALTA) 60 MG capsule TAKE 1 CAPSULE (60 MG TOTAL) BY MOUTH 2 (TWO) TIMES DAILY. 01/21/15  Yes York Spaniel, MD  ENABLEX 7.5 MG 24 hr tablet Take 1 tablet by mouth daily. 06/18/14  Yes Historical Provider, MD  esomeprazole (NEXIUM) 40 MG capsule Take 1 capsule (40 mg total) by mouth daily at 12 noon. 07/24/14  Yes Lewayne Bunting, MD  gabapentin (NEURONTIN) 100 MG capsule Take 100 mg by mouth as needed.   Yes Historical Provider, MD  losartan-hydrochlorothiazide (HYZAAR) 100-12.5 MG per tablet Take 1 tablet by mouth daily. 08/12/14  Yes Lewayne Bunting, MD  methylphenidate (RITALIN) 20 MG tablet Take 1 tablet (20 mg total) by mouth daily. At noon 06/15/15  Yes York Spaniel, MD  metoprolol succinate (TOPROL-XL) 50 MG 24 hr tablet Take 1 tablet (50 mg total) by mouth daily. 03/17/15  Yes Lewayne Bunting, MD  modafinil (PROVIGIL) 200 MG tablet Take 1 tablet (200 mg total) by mouth daily. 07/27/14  Yes York Spaniel, MD  NASONEX 50 MCG/ACT nasal spray Place 50 sprays into the nose daily. 09/16/12  Yes Historical Provider, MD  ticagrelor (BRILINTA) 90 MG TABS tablet Take 1 tablet (90 mg total) by mouth 2 (two) times daily. 08/12/14  Yes Lewayne Bunting, MD  clonazePAM (KLONOPIN) 0.25 MG disintegrating tablet Take 1 tablet (0.25 mg total) by mouth 2 (two) times daily as needed. 10/11/12   York Spaniel, MD  oxyCODONE-acetaminophen (PERCOCET/ROXICET) 5-325 MG per tablet Take 2 tablets by mouth every 4 (four) hours as needed for severe pain. 04/20/14   Elson Areas, PA-C  potassium chloride (K-DUR) 10 MEQ tablet Take 1 tablet (10 mEq total) by mouth daily. 06/26/15   Jerelyn Scott, MD   BP 108/57 mmHg  Pulse 62   Temp(Src) 97.9 F (36.6 C) (Oral)  Resp 20  Ht 5\' 2"  (1.575 m)  Wt 167 lb 14.4 oz (76.159 kg)  BMI 30.70 kg/m2  SpO2 100%  Vitals reviewed Physical Exam  Physical Examination: General appearance - alert, well appearing, and in no distress Mental status - alert, oriented to person, place, and time Eyes - no conjunctival injection, no scleral icterus Mouth - mucous membranes moist, pharynx normal without lesions Neck - supple, no significant adenopathy Chest - clear to auscultation, no wheezes, rales or rhonchi, symmetric air entry, normal  respiratory effort Heart - normal rate, regular rhythm, normal S1, S2, no murmurs, rubs, clicks or gallops Back- no midline tenderness, small point tender area below right scapula Abdomen - soft, nontender, nondistended, no masses or organomegaly Neurological - alert, oriented, normal speech Extremities - peripheral pulses normal, no pedal edema, no clubbing or cyanosis Skin - normal coloration and turgor, no rashes, no suspicious skin lesions noted  ED Course  Procedures (including critical care time) DIAGNOSTIC STUDIES: Oxygen Saturation is 95% on RA, normal by my interpretation.    COORDINATION OF CARE: 8:03 PM-Discussed treatment plan with pt at bedside and pt agreed to plan.     Labs Review Labs Reviewed  BASIC METABOLIC PANEL - Abnormal; Notable for the following:    Potassium 2.6 (*)    Chloride 100 (*)    Calcium 8.6 (*)    All other components within normal limits  CBC - Abnormal; Notable for the following:    Hemoglobin 11.3 (*)    HCT 34.1 (*)    Platelets 490 (*)    All other components within normal limits  TROPONIN I    Imaging Review Dg Chest 2 View  06/26/2015  CLINICAL DATA:  Shortness of breath today. History of coronary stents. EXAM: CHEST  2 VIEW COMPARISON:  06/17/2014 FINDINGS: The lungs are adequately inflated without consolidation or effusion. Cardiomediastinal silhouette is within normal. Evidence of to  coronary stents present. Fusion hardware intact over the cervical spine. There are mild degenerate changes of the thoracic spine. IMPRESSION: No active cardiopulmonary disease. Electronically Signed   By: Elberta Fortis M.D.   On: 06/26/2015 20:08      EKG Interpretation   Date/Time:  Saturday June 26 2015 18:22:01 EST Ventricular Rate:  63 PR Interval:  169 QRS Duration: 85 QT Interval:  447 QTC Calculation: 458 R Axis:   76 Text Interpretation:  Sinus rhythm Baseline wander in lead(s) V6 No  significant change since last tracing Confirmed by [facility]  MD, Jacinto Keil  [phone]) on 06/26/2015 8:15:02 PM      MDM   Final diagnoses:  Hypokalemia   Pt presenting with c/o feeling vague sensation of shortness of breath.  No chest pain. No leg swelling.  Symptoms were not associated with exertion.  She has hx of CAD with stents, but has no nausea, no chest pain, no diaphoresis. Shortness of breath is no longer present during ED evaluation.  Low suspicion for ACS.  Potassium is low, this may help to explain her generalized weakness. Potassium repleted both IV/PO in the ED.  EKG and troponin both reassuring.  Pt is feeling well upon discharge.  Advised her to f/u with cardiologist if symptoms return.  Discharged with strict return precautions.  Pt agreeable with plan.   I personally performed the services described in this documentation, which was scribed in my presence. The recorded information has been reviewed and is accurate.       Jerelyn Scott, MD 06/26/15 2252

## 2015-06-26 NOTE — ED Notes (Signed)
Pt reports she is SOB for several hours. States she had cardiac stents placed last year and has not felt SOB like this since then

## 2015-06-26 NOTE — ED Notes (Signed)
Lab called and states K level is 2.6. MD aware.

## 2015-06-26 NOTE — Discharge Instructions (Signed)
Return to the ED with any concerns including difficulty breathing, chest pain, fainting, vomiting and not able to keep down liquids, decreased level of alertness/lethargy, or any other alarming symptoms 

## 2015-06-26 NOTE — ED Notes (Addendum)
Pt states she started taking doxycycline and percocet yesterday after ear procedure

## 2015-07-05 DIAGNOSIS — S92353A Displaced fracture of fifth metatarsal bone, unspecified foot, initial encounter for closed fracture: Secondary | ICD-10-CM | POA: Insufficient documentation

## 2015-07-05 DIAGNOSIS — S93609A Unspecified sprain of unspecified foot, initial encounter: Secondary | ICD-10-CM | POA: Insufficient documentation

## 2015-07-06 DIAGNOSIS — F3341 Major depressive disorder, recurrent, in partial remission: Secondary | ICD-10-CM | POA: Insufficient documentation

## 2015-07-14 ENCOUNTER — Other Ambulatory Visit: Payer: Self-pay | Admitting: Cardiology

## 2015-07-14 NOTE — Telephone Encounter (Signed)
REFILL 

## 2015-07-22 ENCOUNTER — Other Ambulatory Visit: Payer: Self-pay | Admitting: Neurology

## 2015-07-22 ENCOUNTER — Telehealth: Payer: Self-pay | Admitting: *Deleted

## 2015-07-22 DIAGNOSIS — G35 Multiple sclerosis: Secondary | ICD-10-CM

## 2015-07-22 MED ORDER — METHYLPHENIDATE HCL 20 MG PO TABS: 20.0000 mg | ORAL_TABLET | Freq: Every day | ORAL | Status: DC

## 2015-07-22 NOTE — Telephone Encounter (Signed)
Patient is calling to get a written Rx for methylphenidate (RITALIN) 20 MG tablet. I advised the Rx will be ready in 24 hours unless otherwise advised by the nurse. Thank you. °

## 2015-07-22 NOTE — Telephone Encounter (Signed)
Methylphenidate rx placed up front for pick up.

## 2015-07-23 NOTE — Telephone Encounter (Signed)
I called pt and let her know prescription ready for pickup.   Close at 1200 noon today.  She verbalized understanding.

## 2015-08-02 ENCOUNTER — Ambulatory Visit (INDEPENDENT_AMBULATORY_CARE_PROVIDER_SITE_OTHER): Payer: Medicare Other | Admitting: Podiatry

## 2015-08-02 ENCOUNTER — Ambulatory Visit (INDEPENDENT_AMBULATORY_CARE_PROVIDER_SITE_OTHER): Payer: Medicare Other

## 2015-08-02 ENCOUNTER — Ambulatory Visit: Payer: Self-pay

## 2015-08-02 VITALS — BP 126/83 | HR 79 | Resp 16 | Ht 62.0 in | Wt 160.0 lb

## 2015-08-02 DIAGNOSIS — M21619 Bunion of unspecified foot: Secondary | ICD-10-CM | POA: Diagnosis not present

## 2015-08-02 DIAGNOSIS — M79671 Pain in right foot: Secondary | ICD-10-CM

## 2015-08-02 DIAGNOSIS — M2040 Other hammer toe(s) (acquired), unspecified foot: Secondary | ICD-10-CM | POA: Diagnosis not present

## 2015-08-02 NOTE — Progress Notes (Signed)
   Subjective:    Patient ID: Summer Henderson, female    DOB: 18-Jun-1960, 55 y.o.   MRN: [id number]  HPI Patient presents with foot pain in their right foot; bunion. Pt stated, "bunion burns sometimes".  Patient has a history of getting surgery on their left foot for their bunion over 5 years ago.   Review of Systems  Constitutional: Positive for fatigue.  HENT: Positive for sinus pressure.   Respiratory: Positive for cough.   Gastrointestinal: Positive for constipation.  Endocrine: Positive for heat intolerance and polydipsia.  Genitourinary: Positive for urgency and frequency.  Musculoskeletal: Positive for gait problem.  Neurological: Positive for tremors and weakness.  Hematological: Bruises/bleeds easily.  All other systems reviewed and are negative.      Objective:   Physical Exam        Assessment & Plan:

## 2015-08-02 NOTE — Patient Instructions (Signed)
Pre-Operative Instructions  Congratulations, you have decided to take an important step to improving your quality of life.  You can be assured that the doctors of [facility] will be with you every step of the way.  1. Plan to be at the surgery center/hospital at least 1 (one) hour prior to your scheduled time unless otherwise directed by the surgical center/hospital staff.  You must have a responsible adult accompany you, remain during the surgery and drive you home.  Make sure you have directions to the surgical center/hospital and know how to get there on time. 2. For hospital based surgery you will need to obtain a history and physical form from your family physician within 1 month prior to the date of surgery- we will give you a form for you primary physician.  3. We make every effort to accommodate the date you request for surgery.  There are however, times where surgery dates or times have to be moved.  We will contact you as soon as possible if a change in schedule is required.   4. No Aspirin/Ibuprofen for one week before surgery.  If you are on aspirin, any non-steroidal anti-inflammatory medications (Mobic, Aleve, Ibuprofen) you should stop taking it 7 days prior to your surgery.  You make take Tylenol  For pain prior to surgery.  5. Medications- If you are taking daily heart and blood pressure medications, seizure, reflux, allergy, asthma, anxiety, pain or diabetes medications, make sure the surgery center/hospital is aware before the day of surgery so they may notify you which medications to take or avoid the day of surgery. 6. No food or drink after midnight the night before surgery unless directed otherwise by surgical center/hospital staff. 7. No alcoholic beverages 24 hours prior to surgery.  No smoking 24 hours prior to or 24 hours after surgery. 8. Wear loose pants or shorts- loose enough to fit over bandages, boots, and casts. 9. No slip on shoes, sneakers are best. 10. Bring  your boot with you to the surgery center/hospital.  Also bring crutches or a walker if your physician has prescribed it for you.  If you do not have this equipment, it will be provided for you after surgery. 11. If you have not been contracted by the surgery center/hospital by the day before your surgery, call to confirm the date and time of your surgery. 12. Leave-time from work may vary depending on the type of surgery you have.  Appropriate arrangements should be made prior to surgery with your employer. 13. Prescriptions will be provided immediately following surgery by your doctor.  Have these filled as soon as possible after surgery and take the medication as directed. 14. Remove nail polish on the operative foot. 15. Wash the night before surgery.  The night before surgery wash the foot and leg well with the antibacterial soap provided and water paying special attention to beneath the toenails and in between the toes.  Rinse thoroughly with water and dry well with a towel.  Perform this wash unless told not to do so by your physician.  Enclosed: 1 Ice pack (please put in freezer the night before surgery)   1 Hibiclens skin cleaner   Pre-op Instructions  If you have any questions regarding the instructions, do not hesitate to call our office.  [place]: [address] [place], [place] [address] [phone]  [place]: [address] [place], [place] [address] [phone]  [place]: [address]  [place], [place] [address] [phone]  Dr. Richard   Tuchman DPM, Dr. Priyanka Causey DPM Dr. Richard Sikora DPM, Dr. M. Todd Hyatt DPM, Dr. Kathryn Egerton DPM 

## 2015-08-03 ENCOUNTER — Other Ambulatory Visit: Payer: Self-pay | Admitting: *Deleted

## 2015-08-03 ENCOUNTER — Other Ambulatory Visit: Payer: Self-pay | Admitting: Cardiology

## 2015-08-03 MED ORDER — LOSARTAN POTASSIUM-HCTZ 100-12.5 MG PO TABS: 1.0000 | ORAL_TABLET | Freq: Every day | ORAL | Status: DC

## 2015-08-03 NOTE — Telephone Encounter (Signed)
°*  STAT* If patient is at the pharmacy, call can be transferred to refill team.   1. Which medications need to be refilled? (please list name of each medication and dose if known) Losartan-Pharmacist said they have been trying to get thet refill-please call today if possible 2. Which pharmacy/location (including street and city if local pharmacy) is medication to be sent to?[phone]  3. Do they need a 30 day or 90 day supply? 90 and refills

## 2015-08-03 NOTE — Telephone Encounter (Signed)
Rx(s) sent to pharmacy electronically.  

## 2015-08-04 NOTE — Progress Notes (Signed)
Subjective:     Patient ID: Summer Henderson, female   DOB: 06/05/1960, 55 y.o.   MRN: [id number]  HPI patient states I have to get this bunion fixed on my right foot and the toes really bother me. States it's been ongoing and making it difficult to walk comfortably. I had fixed the left foot which is done very well and she had had surgery around 30 years ago on the right foot with reoccurrence of bunion deformity. The left second toe also bothers her some and she states that she's tried wider shoes she's tried padding therapy and other modalities without relief of symptoms   Review of Systems     Objective:   Physical Exam  Constitutional: She is oriented to person, place, and time.  Cardiovascular: Intact distal pulses.   Musculoskeletal: Normal range of motion.  Neurological: She is oriented to person, place, and time.  Skin: Skin is warm.  Nursing note and vitals reviewed.  neurovascular status intact muscle strength adequate range of motion within normal limits with large hyperostosis medial aspect first metatarsal head right that's red and elevated lesser digits right with callus formation that are painful. Patient's found to have good structural position of the left foot with mild elevation of the second toe and scar tissue around the MPJ that can become irritative for her.     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 Due to long-standing nature and good success with the left foot she wants the right foot fixed and I recommended Austin-type osteotomy right with pin fixation explained we will probably not get full correction but it should be adequate along with digital fusion of the lesser digits. I allowed her to read a consent form reviewing alternative treatments and complications and she wants surgery understanding risk and is scheduled for  outpatient surgery. We will also try to lower the second toe left utilizing a simple release of the metatarsal phalangeal joint  X-ray report indicates elevation of the intermetatarsal angle between the first and second metatarsal right of approximate 16 with elevation of the lesser digits with rigid contracture at the metatarsal phalangeal joint and proximal interphalangeal joint digits 234 right. Mild elevation of second digit left with good correction of underlying structural deformity

## 2015-08-12 ENCOUNTER — Telehealth: Payer: Self-pay | Admitting: *Deleted

## 2015-08-12 DIAGNOSIS — Z7952 Long term (current) use of systemic steroids: Secondary | ICD-10-CM | POA: Insufficient documentation

## 2015-08-12 NOTE — Telephone Encounter (Signed)
"  I'm scheduled to have surgery on Tuesday morning, April 11.  I did my registration with the surgery center.  The nurse said I needed to call and ask Dr. Charlsie Merles if he wanted me to stop my aspirin that I take everyday.  The nurse said he usually tell people not to take the aspirin.  She said she didn't want me to show up for surgery and not be able to have surgery.  Please call me back."

## 2015-08-13 NOTE — Telephone Encounter (Signed)
I'm returning your call from yesterday.  Dr. Charlsie Merles said stop the aspirin tomorrow.  "The nurse had already called and told me it was okay to keep taking it.  I'm getting scared now.  Are you all sure of what's going on?"  You must have left messages with me and with the nurse.  Dr. Charlsie Merles told to tell you to stop taking it tomorrow.  "I called and left a message with the nurse too because I hadn't heard back from you.  Are you sure I will be okay not taking it?  I take it for my heart."  I don't think Dr. Charlsie Merles would recommend anything that will cause you harm.  "Alright, thank you I will stop taking it tomorrow."

## 2015-08-17 ENCOUNTER — Encounter: Payer: Self-pay | Admitting: Podiatry

## 2015-08-17 DIAGNOSIS — M2041 Other hammer toe(s) (acquired), right foot: Secondary | ICD-10-CM | POA: Diagnosis not present

## 2015-08-17 DIAGNOSIS — M2011 Hallux valgus (acquired), right foot: Secondary | ICD-10-CM | POA: Diagnosis not present

## 2015-08-25 ENCOUNTER — Telehealth: Payer: Self-pay | Admitting: Neurology

## 2015-08-25 DIAGNOSIS — G35 Multiple sclerosis: Secondary | ICD-10-CM

## 2015-08-25 NOTE — Telephone Encounter (Signed)
Pt requests refill for methylphenidate (RITALIN) 20 MG tablet

## 2015-08-26 ENCOUNTER — Ambulatory Visit (INDEPENDENT_AMBULATORY_CARE_PROVIDER_SITE_OTHER): Payer: Medicare Other

## 2015-08-26 ENCOUNTER — Encounter: Payer: Self-pay | Admitting: Podiatry

## 2015-08-26 ENCOUNTER — Ambulatory Visit (INDEPENDENT_AMBULATORY_CARE_PROVIDER_SITE_OTHER): Payer: Medicare Other | Admitting: Podiatry

## 2015-08-26 VITALS — BP 100/64 | HR 78 | Temp 98.4°F | Resp 16

## 2015-08-26 DIAGNOSIS — Z9889 Other specified postprocedural states: Secondary | ICD-10-CM | POA: Diagnosis not present

## 2015-08-26 DIAGNOSIS — M2040 Other hammer toe(s) (acquired), unspecified foot: Secondary | ICD-10-CM

## 2015-08-26 DIAGNOSIS — M21619 Bunion of unspecified foot: Secondary | ICD-10-CM

## 2015-08-26 MED ORDER — METHYLPHENIDATE HCL 20 MG PO TABS: 20.0000 mg | ORAL_TABLET | Freq: Every day | ORAL | Status: DC

## 2015-08-26 NOTE — Telephone Encounter (Signed)
The Ritalin prescription has been refilled.

## 2015-08-26 NOTE — Progress Notes (Signed)
DOS 08/17/2015 Austin Bunionectomy (cutting and moving bone) with pin fixation right, Fusion with pins 2,3,4th toes right, Release metatarsal 2nd left.

## 2015-08-26 NOTE — Progress Notes (Signed)
Subjective:     Patient ID: Summer Henderson, female   DOB: Sep 16, 1960, 55 y.o.   MRN: [id number]  HPI patient presents stating my toes are in good alignment and I'm very pleased with minimal discomfort or swelling   Review of Systems     Objective:   Physical Exam Neurovascular status intact negative Homan sign was noted with patient having pins and alignment second third fourth toes right structural bunion in good position and second toe lowered left from tenotomy.    Assessment:     Doing well post forefoot reconstruction right with all wound edges well coapted and lowering of second toe left    Plan:     H&P and x-ray of right reviewed with patient. Reapplied sterile dressing continue immobilization and begin range of motion and reappoint 2 weeks for suture removal or earlier if needed  X-ray report indicates pins are in place second third and fourth toes and pins are in place and the first metatarsal

## 2015-08-26 NOTE — Telephone Encounter (Signed)
Rx printed, signed, up front for pick-up. 

## 2015-09-09 ENCOUNTER — Ambulatory Visit (INDEPENDENT_AMBULATORY_CARE_PROVIDER_SITE_OTHER): Payer: Medicare Other

## 2015-09-09 ENCOUNTER — Ambulatory Visit (INDEPENDENT_AMBULATORY_CARE_PROVIDER_SITE_OTHER): Payer: Medicare Other | Admitting: Podiatry

## 2015-09-09 DIAGNOSIS — M21619 Bunion of unspecified foot: Secondary | ICD-10-CM

## 2015-09-09 DIAGNOSIS — M2040 Other hammer toe(s) (acquired), unspecified foot: Secondary | ICD-10-CM | POA: Diagnosis not present

## 2015-09-09 DIAGNOSIS — Z9889 Other specified postprocedural states: Secondary | ICD-10-CM | POA: Diagnosis not present

## 2015-09-09 MED ORDER — OXYCODONE-ACETAMINOPHEN 10-325 MG PO TABS: ORAL_TABLET | ORAL | Status: DC

## 2015-09-09 MED ORDER — OXYCODONE-ACETAMINOPHEN 10-325 MG PO TABS: 1.0000 | ORAL_TABLET | ORAL | Status: DC | PRN

## 2015-09-09 NOTE — Progress Notes (Signed)
Subjective:     Patient ID: Summer Henderson, female   DOB: Jun 25, 1960, 55 y.o.   MRN: [id number]  HPI patient states that she's doing fine but the pins bother her and she'll probably need more pain medicine until we can remove the   Review of Systems     Objective:   Physical Exam Neurovascular status intact muscle strength adequate negative Homans sign noted with good digital alignment and pins in place lesser digits with good structural alignment of the first metatarsal right with wound edges well coapted    Assessment:     Doing well post forefoot reconstruction right    Plan:     Advised patient on stitch removal which was accomplished today continued sterile dressing usage and reappoint 2 weeks for pin removal or earlier if needed for anything. Did write her a prescription for Percocet to take for the next several weeks  X-ray report indicated pins in place good alignment of the metatarsal with pins in place there also

## 2015-09-13 ENCOUNTER — Other Ambulatory Visit: Payer: Self-pay | Admitting: Neurology

## 2015-09-15 ENCOUNTER — Encounter: Payer: Self-pay | Admitting: Podiatry

## 2015-09-15 ENCOUNTER — Ambulatory Visit (INDEPENDENT_AMBULATORY_CARE_PROVIDER_SITE_OTHER): Payer: Medicare Other | Admitting: Podiatry

## 2015-09-15 ENCOUNTER — Ambulatory Visit (INDEPENDENT_AMBULATORY_CARE_PROVIDER_SITE_OTHER): Payer: Medicare Other

## 2015-09-15 VITALS — BP 125/74 | HR 80 | Resp 16

## 2015-09-15 DIAGNOSIS — M2011 Hallux valgus (acquired), right foot: Secondary | ICD-10-CM

## 2015-09-15 DIAGNOSIS — M2041 Other hammer toe(s) (acquired), right foot: Secondary | ICD-10-CM

## 2015-09-15 DIAGNOSIS — Z9889 Other specified postprocedural states: Secondary | ICD-10-CM

## 2015-09-16 NOTE — Progress Notes (Signed)
Subjective:     Patient ID: Summer Henderson, female   DOB: 10/11/1960, 55 y.o.   MRN: [id number]  HPI patient states that she forgot that she had surgery on her foot and she walked on her tiptoes and jammed her pins into her digits   Review of Systems     Objective:   Physical Exam Neurovascular status unchanged with pain in the second third and fourth toe secondary to trauma from walking directly on the ends of her toes    Assessment:     Abnormal trauma to the lesser digits right foot    Plan:     X-ray reviewed and discussed there is been some separation of the digits but I do think overall they look excellent clinically and  they should heal okay. I did bring them out slightly to create more room and that gave her relief of pain and she'll be seen back to recheck  X-rays indicate that the pins are in place there is been some separation at the interphalangeal joint but I do think are stable and should heal at this point

## 2015-09-23 ENCOUNTER — Ambulatory Visit (INDEPENDENT_AMBULATORY_CARE_PROVIDER_SITE_OTHER): Payer: Medicare Other | Admitting: Podiatry

## 2015-09-23 ENCOUNTER — Encounter: Payer: Self-pay | Admitting: Podiatry

## 2015-09-23 ENCOUNTER — Ambulatory Visit (INDEPENDENT_AMBULATORY_CARE_PROVIDER_SITE_OTHER): Payer: Medicare Other

## 2015-09-23 DIAGNOSIS — M2041 Other hammer toe(s) (acquired), right foot: Secondary | ICD-10-CM

## 2015-09-23 DIAGNOSIS — M2011 Hallux valgus (acquired), right foot: Secondary | ICD-10-CM

## 2015-09-23 DIAGNOSIS — Z9889 Other specified postprocedural states: Secondary | ICD-10-CM | POA: Diagnosis not present

## 2015-09-23 NOTE — Progress Notes (Signed)
Subjective:     Patient ID: Summer Henderson, female   DOB: Jan 25, 1961, 55 y.o.   MRN: [id number]  HPI patient presents stating I keep bumping my foot but overall it's doing okay and the pins have state in place and I know I'm very traumatic and I cannot be compliant but I've had to go without my surgical shoe some   Review of Systems     Objective:   Physical Exam Neurovascular status intact negative Homans sign noted pins in place second third fourth toes right with good alignment of the underlying digits and structural bunion well corrected with good alignment    Assessment:     Noncompliant patient who so far clinically looks good but has had a lot of stress on her foot    Plan:     X-ray reviewed with patient pins removed sterile dressings applied and advised on the importance of immobilization for the next 3 weeks and reappoint at that time  X-ray report indicates there is some stress on the first metatarsal and the digits but overall they should heal uneventfully at the current positions and should give her good long-term result

## 2015-09-24 ENCOUNTER — Other Ambulatory Visit: Payer: Self-pay | Admitting: Cardiology

## 2015-09-24 NOTE — Telephone Encounter (Signed)
Rx(s) sent to pharmacy electronically.  

## 2015-09-27 ENCOUNTER — Telehealth: Payer: Self-pay

## 2015-09-27 ENCOUNTER — Other Ambulatory Visit: Payer: Self-pay

## 2015-09-27 DIAGNOSIS — E785 Hyperlipidemia, unspecified: Secondary | ICD-10-CM

## 2015-09-27 MED ORDER — ATORVASTATIN CALCIUM 40 MG PO TABS: 40.0000 mg | ORAL_TABLET | Freq: Every day | ORAL | Status: DC

## 2015-09-27 NOTE — Telephone Encounter (Signed)
Patient called in today and wanted to know when she needed to see Dr Jens Som. I informed her that the AVS said she needed to followup in 6 months. She wants to make an appointment in the [place] office. Please give her a call to set up the appointment please.

## 2015-09-28 NOTE — Telephone Encounter (Signed)
Pt needs follow up 6 month appointment with dr Jens Som in [place]. Please call and schedule, thanks

## 2015-10-06 ENCOUNTER — Other Ambulatory Visit: Payer: Self-pay | Admitting: Neurology

## 2015-10-06 DIAGNOSIS — G35 Multiple sclerosis: Secondary | ICD-10-CM

## 2015-10-06 MED ORDER — METHYLPHENIDATE HCL 20 MG PO TABS: 20.0000 mg | ORAL_TABLET | Freq: Every day | ORAL | Status: DC

## 2015-10-06 NOTE — Telephone Encounter (Signed)
Message For: OFC                  Taken 31-MAY-17 at  1:47PM by ATJ ------------------------------------------------------------  Summer Henderson                 CID  [id number]   Patient  SAME                  Pt's Dr  Anne Hahn        Area Code  336  Phone#  [phone] *  DOB  [birth year]      RE  NEEDS RX FOR RITALIN                                                                                    Disp:Y/N  N  If Y = C/B If No Response In  ============================================================

## 2015-10-06 NOTE — Telephone Encounter (Signed)
Last OV was 04/28/15 w/ f/u scheduled 10/27/15 Last filled 08/26/15

## 2015-10-07 NOTE — Telephone Encounter (Signed)
Rx printed, signed, up front for pick-up. 

## 2015-10-14 ENCOUNTER — Ambulatory Visit (INDEPENDENT_AMBULATORY_CARE_PROVIDER_SITE_OTHER): Payer: Medicare Other | Admitting: Podiatry

## 2015-10-14 ENCOUNTER — Ambulatory Visit (INDEPENDENT_AMBULATORY_CARE_PROVIDER_SITE_OTHER): Payer: Medicare Other

## 2015-10-14 ENCOUNTER — Encounter: Payer: Self-pay | Admitting: Podiatry

## 2015-10-14 VITALS — BP 131/74 | HR 73 | Resp 16

## 2015-10-14 DIAGNOSIS — M2041 Other hammer toe(s) (acquired), right foot: Secondary | ICD-10-CM

## 2015-10-14 DIAGNOSIS — Z9889 Other specified postprocedural states: Secondary | ICD-10-CM | POA: Diagnosis not present

## 2015-10-14 DIAGNOSIS — M2011 Hallux valgus (acquired), right foot: Secondary | ICD-10-CM | POA: Diagnosis not present

## 2015-10-14 NOTE — Progress Notes (Signed)
Subjective:     Patient ID: Summer Henderson, female   DOB: October 10, 1960, 55 y.o.   MRN: [id number]  HPI patient states my toe bothers me at times second but overall I'm doing well with my surgery   Review of Systems     Objective:   Physical Exam Neurovascular status intact muscle strength adequate range of motion within normal limits with all incision sites healing well and good alignment of the lesser digits with excellent range of motion first MPJ    Assessment:     Doing well post surgery right foot    Plan:     X-rays evaluated and patient will return to normal activity as best as possible and it should gradually continue to improve. She will be seen back as needed  X-ray report indicated that the osteotomy is healing well digits are in good alignment with no signs of pathology

## 2015-10-18 DIAGNOSIS — M659 Synovitis and tenosynovitis, unspecified: Secondary | ICD-10-CM | POA: Insufficient documentation

## 2015-10-20 ENCOUNTER — Ambulatory Visit (INDEPENDENT_AMBULATORY_CARE_PROVIDER_SITE_OTHER): Payer: Medicare Other | Admitting: Adult Health

## 2015-10-20 ENCOUNTER — Encounter: Payer: Self-pay | Admitting: Adult Health

## 2015-10-20 VITALS — BP 126/88 | HR 78 | Resp 16 | Ht 62.0 in | Wt 155.2 lb

## 2015-10-20 DIAGNOSIS — G35 Multiple sclerosis: Secondary | ICD-10-CM

## 2015-10-20 DIAGNOSIS — R269 Unspecified abnormalities of gait and mobility: Secondary | ICD-10-CM | POA: Diagnosis not present

## 2015-10-20 DIAGNOSIS — Z5181 Encounter for therapeutic drug level monitoring: Secondary | ICD-10-CM | POA: Diagnosis not present

## 2015-10-20 NOTE — Progress Notes (Signed)
PATIENT: Summer Henderson DOB: 04-Jun-1960  REASON FOR VISIT: follow up- multiple sclerosis HISTORY FROM: patient  HISTORY OF PRESENT ILLNESS: Summer Henderson is a 55 year old female with a history of multiple sclerosis. She returns today for follow-up. She is currently on Tecfidera and tolerating it well. She denies any new neurological symptoms. She does state that she had a fall at [facility] and fractured her foot. Since then her foot has healed. She has not had any additional falls. She reports that she does have AFO braces but she does not use them. She states that they do not fit in most of her shoes. She denies any new numbness or weakness. She does state that she has significant fatigue throughout the day.. Ritalin has helped. She states occasionally she will take Provigil midday but she has to be careful taking this medication as it does keep her awake at night. She does not use an assistive device when ambulating. She does have a cane but does not use it. She is adamant that a cane or walker will not prevent her falls. She returns today for an evaluation.  HISTORY 04/28/15 (WILLIS): Summer Henderson is a 55 year old right-handed white female with a history of multiple sclerosis. The patient is on Tecfidera, she is tolerating this drug fairly well, she will have occasional hot flashes at times. The patient has had recent blood work done that included a liver profile that was unremarkable, but a CBC was not done. The patient last had MRI evaluation the brain in June 2014. She is having some new issues. She has noted that she is having some brief episodes of dizziness and a feeling of instability when she first stands up. She will note this if she has been sitting a long time, she generally does not have problems when she first gets up in the morning. The patient has not had any falls because of this. She did fall on 2 occasions in the summertime associated with significant heat exposure. The patient does have some  gait instability, but she has not been regularly using a cane or walking stick when she is outside the house. She indicates that she has difficulty maintaining balance when she is standing still. She has a neurogenic bladder, she takes medication for this. She also reports some issues with restless leg syndrome that have been bothersome for her recently. She has gabapentin at home that she no longer uses. She was using this for neuromuscular pain. The patient continues to note some chronic issues with fatigue. She has gone back to smoking cigarettes again  REVIEW OF SYSTEMS: Out of a complete 14 system review of symptoms, the patient complains only of the following symptoms, and all other reviewed systems are negative.  Incontinence of bladder, frequency of urination, urgency, constipation, restless leg, snoring, depression, nervous/anxious, weakness, tremors, memory loss, bruise/bleed easily, appetite change, fatigue  ALLERGIES: Allergies  Allergen Reactions  . Demerol [Meperidine] Other (See Comments)    [derm] rash, swelling, itching Other reaction(s): Other (See Comments) [derm] rash, swelling, itching  . Erythromycin   . Morphine And Related   . Sulfa Antibiotics   . Diflucan [Fluconazole] Other (See Comments)    States she has prolonged QT and cannot take this.  . Other     Other reaction(s): Other (See Comments) Z-pak causes EKG changes, was told by cardiologist not to take this.  . Sulfacetamide Sodium   . Erythromycin Base Rash    [Derm] rash  . Fesoterodine Palpitations  .  Tape     Adhesive tape  . Toviaz [Fesoterodine Fumarate Er] Palpitations    HOME MEDICATIONS: Outpatient Prescriptions Prior to Visit  Medication Sig Dispense Refill  . aspirin 81 MG tablet Take 1 tablet (81 mg total) by mouth daily. 30 tablet   . atorvastatin (LIPITOR) 40 MG tablet Take 1 tablet (40 mg total) by mouth daily at 6 PM. 90 tablet 3  . clonazePAM (KLONOPIN) 0.25 MG disintegrating tablet  Take 1 tablet (0.25 mg total) by mouth 2 (two) times daily as needed. 60 tablet 1  . Dimethyl Fumarate (TECFIDERA) 240 MG CPDR Take 1 capsule (240 mg total) by mouth 2 (two) times daily. 180 capsule 1  . DULoxetine (CYMBALTA) 60 MG capsule TAKE 1 CAPSULE (60 MG TOTAL) BY MOUTH 2 (TWO) TIMES DAILY. 180 capsule 0  . ENABLEX 7.5 MG 24 hr tablet Take 1 tablet by mouth daily.    Marland Kitchen esomeprazole (NEXIUM) 40 MG capsule TAKE 1 CAPSULE (40 MG TOTAL) BY MOUTH DAILY AT 12 NOON. 30 capsule 6  . gabapentin (NEURONTIN) 100 MG capsule Take 100 mg by mouth as needed.    Marland Kitchen losartan-hydrochlorothiazide (HYZAAR) 100-12.5 MG tablet Take 1 tablet by mouth daily. 90 tablet 0  . methylphenidate (RITALIN) 20 MG tablet Take 1 tablet (20 mg total) by mouth daily. At noon 30 tablet 0  . metoprolol succinate (TOPROL-XL) 50 MG 24 hr tablet Take 1 tablet (50 mg total) by mouth daily. PLEASE CONTACT OFFICE FOR ADDITIONAL REFILLS 90 tablet 1  . modafinil (PROVIGIL) 200 MG tablet Take 1 tablet (200 mg total) by mouth daily. 30 tablet 5  . NASONEX 50 MCG/ACT nasal spray Place 50 sprays into the nose daily.    Marland Kitchen oxyCODONE-acetaminophen (PERCOCET) 10-325 MG tablet Take 1 tablet by mouth every 4 (four) hours as needed for pain (one tablet every 6 hours prn foot pain). 30 tablet 0  . potassium chloride (K-DUR) 10 MEQ tablet Take 1 tablet (10 mEq total) by mouth daily. 20 tablet 0  . ticagrelor (BRILINTA) 90 MG TABS tablet Take 1 tablet (90 mg total) by mouth 2 (two) times daily. (Patient not taking: Reported on 10/20/2015) 60 tablet 6   No facility-administered medications prior to visit.    PAST MEDICAL HISTORY: Past Medical History  Diagnosis Date  . Multiple sclerosis (HCC)   . Chronic fatigue   . Cervical dysplasia   . GERD (gastroesophageal reflux disease)   . Carpal tunnel syndrome   . Prolonged QT interval   . Hypertension   . Mitral valve prolapse   . Anxiety   . Arthritis   . Depression   . Pneumonia   . CAD  (coronary artery disease)     a. 06/2014 abnl MV;  b. 06/2014 Cath: LM nl, LAD 2m (2.75x28 Promus DES), LCX nl, RCA 60-28m (FFR = 0.80 -> 3.0x24 Promus DES), EF 55-65%.  Marland Kitchen RLS (restless legs syndrome) 04/28/2015    PAST SURGICAL HISTORY: Past Surgical History  Procedure Laterality Date  . Rectovaginal fistula repair    . Abdominal hysterectomy    . Dilation and curettage of uterus    . Foot surgery Bilateral     For bunions  . Carpal tunnel release    . Bladder resuspension    . Hammer toe surgery Left   . Cervical spine surgery  10-16-2009  . Orif toe fracture Right   . Biceps tendon repair    . Prolong qtc      Heart  .  Left heart catheterization with coronary angiogram N/A 06/29/2014    Procedure: LEFT HEART CATHETERIZATION WITH CORONARY ANGIOGRAM;  Surgeon: Micheline Chapman, MD;  Location: [facility] CATH LAB;  Service: Cardiovascular;  Laterality: N/A;  . Coronary angioplasty with stent placement      FAMILY HISTORY: Family History  Problem Relation Age of Onset  . Melanoma Mother   . Breast cancer Maternal Grandmother   . Brain cancer Maternal Grandfather   . Lung cancer Maternal Grandfather   . Colon cancer Maternal Grandmother   . Colon polyps Mother   . Diabetes Mother   . Diabetes Maternal Grandfather   . Kidney disease Neg Hx   . Esophageal cancer Neg Hx   . Gallbladder disease Neg Hx     SOCIAL HISTORY: Social History   Social History  . Marital Status: Divorced    Spouse Name: N/A  . Number of Children: 2  . Years of Education: 13   Occupational History  . Disabled     Disability   Social History Main Topics  . Smoking status: Current Every Day Smoker -- 0.50 packs/day for 35 years    Types: Cigarettes    Last Attempt to Quit: 06/30/2014  . Smokeless tobacco: Never Used  . Alcohol Use: No  . Drug Use: No  . Sexual Activity: Not on file   Other Topics Concern  . Not on file   Social History Narrative   Patient does not drink caffeine.   Patient is  right handed.      PHYSICAL EXAM  Filed Vitals:   10/20/15 1134  BP: 126/88  Pulse: 78  Resp: 16  Height: 5\' 2"  (1.575 m)  Weight: 155 lb 3.2 oz (70.398 kg)   Body mass index is 28.38 kg/(m^2).  Generalized: Well developed, in no acute distress   Neurological examination  Mentation: Alert oriented to time, place, history taking. Follows all commands speech and language fluent Cranial nerve II-XII: Pupils were equal round reactive to light. Extraocular movements were full, visual field were full on confrontational test. Facial sensation and strength were normal. Uvula tongue midline. Head turning and shoulder shrug  were normal and symmetric. Motor: The motor testing reveals 5 over 5 strength of all 4 extremities. Good symmetric motor tone is noted throughout.  Sensory: Sensory testing is intact to soft touch on all 4 extremities. No evidence of extinction is noted.  Coordination: Cerebellar testing reveals good finger-nose-finger and heel-to-shin bilaterally.  Gait and station: Patient's gait is slightly unsteady. She tends to veer to the right and left when ambulating. Tandem gait is unsteady. Romberg is unsteady but negative. Reflexes: Deep tendon reflexes are symmetric and normal bilaterally.   DIAGNOSTIC DATA (LABS, IMAGING, TESTING) - I reviewed patient records, labs, notes, testing and imaging myself where available.  Lab Results  Component Value Date   WBC 8.9 06/26/2015   HGB 11.3* 06/26/2015   HCT 34.1* 06/26/2015   MCV 80.2 06/26/2015   PLT 490* 06/26/2015      Component Value Date/Time   NA 135 06/26/2015 1945   NA 139 10/21/2014 1151   K 2.6* 06/26/2015 1945   CL 100* 06/26/2015 1945   CO2 27 06/26/2015 1945   GLUCOSE 93 06/26/2015 1945   GLUCOSE 101* 10/21/2014 1151   BUN 7 06/26/2015 1945   BUN 9 10/21/2014 1151   CREATININE 0.69 06/26/2015 1945   CREATININE 0.67 04/14/2015 0001   CALCIUM 8.6* 06/26/2015 1945   PROT 6.9 04/14/2015 0001   PROT  7.1  10/21/2014 1151   ALBUMIN 3.9 04/14/2015 0001   ALBUMIN 4.5 10/21/2014 1151   AST 20 04/14/2015 0001   ALT 19 04/14/2015 0001   ALKPHOS 127 04/14/2015 0001   BILITOT 0.5 04/14/2015 0001   BILITOT 0.5 10/21/2014 1151   GFRNONAA >60 06/26/2015 1945   GFRNONAA >89 06/17/2014 1111   GFRAA >60 06/26/2015 1945   GFRAA >89 06/17/2014 1111   Lab Results  Component Value Date   CHOL 128 08/27/2014   HDL 48 08/27/2014   LDLCALC 63 08/27/2014   TRIG 83 08/27/2014   CHOLHDL 2.7 08/27/2014      ASSESSMENT AND PLAN 55 y.o. year old female  has a past medical history of Multiple sclerosis (HCC); Chronic fatigue; Cervical dysplasia; GERD (gastroesophageal reflux disease); Carpal tunnel syndrome; Prolonged QT interval; Hypertension; Mitral valve prolapse; Anxiety; Arthritis; Depression; Pneumonia; CAD (coronary artery disease); and RLS (restless legs syndrome) (04/28/2015). here with:  1. Multiple sclerosis 2. Abnormality of gait  Overall the patient has remained stable. She will remain on Tecfidera. I will check blood work today. We will repeat an MRI of the brain. I encouraged the patient to consider physical therapy however she deferred at this time. If her symptoms worsen or she develops any new symptoms she will let us know. She will follow-up in 6 months or sooner if needed.     Butch Penny, MSN, NP-C 10/20/2015, 11:40 AM [facility] [address] [place], [place] [address] [phone]

## 2015-10-20 NOTE — Patient Instructions (Signed)
Blood work today MRI brain ordered Continue tecfidera If your symptoms worsen or you develop new symptoms please let us know.

## 2015-10-20 NOTE — Progress Notes (Signed)
I agree with the assessment and plan as directed by NP .   Leron Stoffers, MD  

## 2015-10-21 LAB — COMPREHENSIVE METABOLIC PANEL
ALK PHOS: 137 IU/L — AB (ref 39–117)
ALT: 18 IU/L (ref 0–32)
AST: 13 IU/L (ref 0–40)
Albumin/Globulin Ratio: 2 (ref 1.2–2.2)
Albumin: 4.3 g/dL (ref 3.5–5.5)
BUN/Creatinine Ratio: 8 — ABNORMAL LOW (ref 9–23)
BUN: 6 mg/dL (ref 6–24)
Bilirubin Total: 0.4 mg/dL (ref 0.0–1.2)
CALCIUM: 9.3 mg/dL (ref 8.7–10.2)
CO2: 25 mmol/L (ref 18–29)
CREATININE: 0.8 mg/dL (ref 0.57–1.00)
Chloride: 94 mmol/L — ABNORMAL LOW (ref 96–106)
GFR calc Af Amer: 97 mL/min/{1.73_m2} (ref >59)
GFR, EST NON AFRICAN AMERICAN: 84 mL/min/{1.73_m2} (ref >59)
GLUCOSE: 99 mg/dL (ref 65–99)
Globulin, Total: 2.2 g/dL (ref 1.5–4.5)
Potassium: 3.4 mmol/L — ABNORMAL LOW (ref 3.5–5.2)
SODIUM: 136 mmol/L (ref 134–144)
Total Protein: 6.5 g/dL (ref 6.0–8.5)

## 2015-10-21 LAB — CBC WITH DIFFERENTIAL/PLATELET
BASOS: 0 %
Basophils Absolute: 0 10*3/uL (ref 0.0–0.2)
EOS (ABSOLUTE): 0 10*3/uL (ref 0.0–0.4)
EOS: 1 %
HEMATOCRIT: 35.1 % (ref 34.0–46.6)
HEMOGLOBIN: 11.9 g/dL (ref 11.1–15.9)
IMMATURE GRANS (ABS): 0 10*3/uL (ref 0.0–0.1)
IMMATURE GRANULOCYTES: 0 %
Lymphocytes Absolute: 2.4 10*3/uL (ref 0.7–3.1)
Lymphs: 33 %
MCH: 27 pg (ref 26.6–33.0)
MCHC: 33.9 g/dL (ref 31.5–35.7)
MCV: 80 fL (ref 79–97)
MONOCYTES: 6 %
MONOS ABS: 0.4 10*3/uL (ref 0.1–0.9)
NEUTROS PCT: 60 %
Neutrophils Absolute: 4.5 10*3/uL (ref 1.4–7.0)
Platelets: 462 10*3/uL — ABNORMAL HIGH (ref 150–379)
RBC: 4.4 x10E6/uL (ref 3.77–5.28)
RDW: 16.6 % — ABNORMAL HIGH (ref 12.3–15.4)
WBC: 7.4 10*3/uL (ref 3.4–10.8)

## 2015-10-22 ENCOUNTER — Telehealth: Payer: Self-pay | Admitting: *Deleted

## 2015-10-22 NOTE — Telephone Encounter (Signed)
-----   Message from Butch Penny, NP sent at 10/21/2015 10:25 PM EDT ----- Blood work consistent with previous blood work. plt is elevated. Potassium slight low. Please call patient. Forward lab work to PCP.

## 2015-10-22 NOTE — Telephone Encounter (Signed)
I have spoken with Summer Henderson this morning, and per MM, advised that labs are consistent with previous labs--platelets are elevated at 462 and K is low at 3.4.  I have faxed results to [facility], fax # [phone]

## 2015-10-27 ENCOUNTER — Ambulatory Visit: Payer: Medicare Other | Admitting: Adult Health

## 2015-11-16 ENCOUNTER — Other Ambulatory Visit: Payer: Self-pay | Admitting: Neurology

## 2015-11-16 DIAGNOSIS — G35 Multiple sclerosis: Secondary | ICD-10-CM

## 2015-11-16 NOTE — Telephone Encounter (Signed)
Last OV 10/20/15 and f/u w/ Megan NP 04/23/16 Last refilled 10/06/15

## 2015-11-16 NOTE — Telephone Encounter (Signed)
Patient requesting refill of methylphenidate (RITALIN) 20 MG tablet °Pharmacy pick up ° ° °

## 2015-11-16 NOTE — Progress Notes (Signed)
HPI: FU CAD, hypertension, prolonged QT and mitral valve prolapse. Holter monitor in September of 2012 showed sinus rhythm with rare PVC. Echocardiogram in June of 2015 showed normal LV function and mild left atrial enlargement. There was trace mitral regurgitation. Patient had an abdominal CT for abdominal pain in December 2015. 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 Nuclear study January 2016 showed an ejection fraction of 56%, probable mild breast attenuation with possible mild mid anteroseptal ischemia. Cardiac catheterization February 2016 showed 80% LAD and 60-70% RCA. Ejection fraction 55-65%. The patient had PCI of the LAD and RCA; ultrasound March 2016 showed no DVT. Since she was last seen, She denies dyspnea, chest pain, palpitations or syncope.  Current Outpatient Prescriptions  Medication Sig Dispense Refill  . aspirin 81 MG tablet Take 1 tablet (81 mg total) by mouth daily. 30 tablet   . atorvastatin (LIPITOR) 40 MG tablet Take 1 tablet (40 mg total) by mouth daily at 6 PM. 90 tablet 3  . clonazePAM (KLONOPIN) 0.25 MG disintegrating tablet Take 1 tablet (0.25 mg total) by mouth 2 (two) times daily as needed. 60 tablet 1  . Dimethyl Fumarate (TECFIDERA) 240 MG CPDR Take 1 capsule (240 mg total) by mouth 2 (two) times daily. 180 capsule 1  . DULoxetine (CYMBALTA) 60 MG capsule TAKE 1 CAPSULE (60 MG TOTAL) BY MOUTH 2 (TWO) TIMES DAILY. 180 capsule 0  . ENABLEX 7.5 MG 24 hr tablet Take 1 tablet by mouth daily.    Marland Kitchen esomeprazole (NEXIUM) 40 MG capsule TAKE 1 CAPSULE (40 MG TOTAL) BY MOUTH DAILY AT 12 NOON. 30 capsule 6  . gabapentin (NEURONTIN) 100 MG capsule Take 100 mg by mouth as needed.    Marland Kitchen losartan-hydrochlorothiazide (HYZAAR) 100-12.5 MG tablet Take 1 tablet by mouth daily. 90 tablet 0  . methylphenidate (RITALIN) 20 MG tablet Take 1 tablet (20 mg total) by mouth daily. At noon 30 tablet 0  . metoprolol succinate  (TOPROL-XL) 50 MG 24 hr tablet Take 1 tablet (50 mg total) by mouth daily. PLEASE CONTACT OFFICE FOR ADDITIONAL REFILLS 90 tablet 1  . modafinil (PROVIGIL) 200 MG tablet Take 1 tablet (200 mg total) by mouth daily. 30 tablet 5  . NASONEX 50 MCG/ACT nasal spray Place 50 sprays into the nose daily.    Marland Kitchen oxyCODONE-acetaminophen (PERCOCET) 10-325 MG tablet Take 1 tablet by mouth every 4 (four) hours as needed for pain (one tablet every 6 hours prn foot pain). 30 tablet 0  . potassium chloride (K-DUR) 10 MEQ tablet Take 1 tablet (10 mEq total) by mouth daily. (Patient not taking: Reported on 11/18/2015) 20 tablet 0   No current facility-administered medications for this visit.     Past Medical History  Diagnosis Date  . Multiple sclerosis (HCC)   . Chronic fatigue   . Cervical dysplasia   . GERD (gastroesophageal reflux disease)   . Carpal tunnel syndrome   . Prolonged QT interval   . Hypertension   . Mitral valve prolapse   . Anxiety   . Arthritis   . Depression   . Pneumonia   . CAD (coronary artery disease)     a. 06/2014 abnl MV;  b. 06/2014 Cath: LM nl, LAD 10m (2.75x28 Promus DES), LCX nl, RCA 60-68m (FFR = 0.80 -> 3.0x24 Promus DES), EF 55-65%.  Marland Kitchen RLS (restless legs syndrome) 04/28/2015    Past Surgical History  Procedure Laterality Date  . Rectovaginal fistula  repair    . Abdominal hysterectomy    . Dilation and curettage of uterus    . Foot surgery Bilateral     For bunions  . Carpal tunnel release    . Bladder resuspension    . Hammer toe surgery Left   . Cervical spine surgery  10-16-2009  . Orif toe fracture Right   . Biceps tendon repair    . Prolong qtc      Heart  . Left heart catheterization with coronary angiogram N/A 06/29/2014    Procedure: LEFT HEART CATHETERIZATION WITH CORONARY ANGIOGRAM;  Surgeon: Micheline Chapman, MD;  Location: [facility] CATH LAB;  Service: Cardiovascular;  Laterality: N/A;  . Coronary angioplasty with stent placement      Social History    Social History  . Marital Status: Divorced    Spouse Name: N/A  . Number of Children: 2  . Years of Education: 13   Occupational History  . Disabled     Disability   Social History Main Topics  . Smoking status: Current Every Day Smoker -- 0.50 packs/day for 35 years    Types: Cigarettes    Last Attempt to Quit: 06/30/2014  . Smokeless tobacco: Never Used  . Alcohol Use: No  . Drug Use: No  . Sexual Activity: Not on file   Other Topics Concern  . Not on file   Social History Narrative   Patient does not drink caffeine.   Patient is right handed.    Family History  Problem Relation Age of Onset  . Melanoma Mother   . Breast cancer Maternal Grandmother   . Brain cancer Maternal Grandfather   . Lung cancer Maternal Grandfather   . Colon cancer Maternal Grandmother   . Colon polyps Mother   . Diabetes Mother   . Diabetes Maternal Grandfather   . Kidney disease Neg Hx   . Esophageal cancer Neg Hx   . Gallbladder disease Neg Hx     ROS: no fevers or chills, productive cough, hemoptysis, dysphasia, odynophagia, melena, hematochezia, dysuria, hematuria, rash, seizure activity, orthopnea, PND, pedal edema, claudication. Remaining systems are negative.  Physical Exam: Well-developed well-nourished in no acute distress.  Skin is warm and dry.  HEENT is normal.  Neck is supple.  Chest is clear to auscultation with normal expansion.  Cardiovascular exam is regular rate and rhythm.  Abdominal exam nontender or distended. No masses palpated. Extremities show no edema. neuro grossly intact  ECG Sinus rhythm at a rate of 64. No ST changes.  A/P  1 Coronary artery disease-continue aspirin, statin.  2 hypertension-blood pressure controlled. Continue present medications. Her potassium has been low. We will recheck an initiate supplement if needed.  3 hyperlipidemia-continue statin. Check lipids and liver.  4 tobacco abuse-patient counseled on discontinuing.  5  history of prolonged QT interval-continue present medications. No history of syncope.  Olga Millers, MD

## 2015-11-17 MED ORDER — METHYLPHENIDATE HCL 20 MG PO TABS: 20.0000 mg | ORAL_TABLET | Freq: Every day | ORAL | Status: DC

## 2015-11-17 NOTE — Telephone Encounter (Signed)
Rx printed, signed, up front for pick-up. 

## 2015-11-18 ENCOUNTER — Ambulatory Visit (INDEPENDENT_AMBULATORY_CARE_PROVIDER_SITE_OTHER): Payer: Medicare Other | Admitting: Cardiology

## 2015-11-18 ENCOUNTER — Encounter: Payer: Self-pay | Admitting: Cardiology

## 2015-11-18 VITALS — BP 136/84 | HR 64 | Ht 62.0 in | Wt 152.0 lb

## 2015-11-18 DIAGNOSIS — E785 Hyperlipidemia, unspecified: Secondary | ICD-10-CM

## 2015-11-18 DIAGNOSIS — I10 Essential (primary) hypertension: Secondary | ICD-10-CM | POA: Diagnosis not present

## 2015-11-18 DIAGNOSIS — Z720 Tobacco use: Secondary | ICD-10-CM

## 2015-11-18 DIAGNOSIS — I2510 Atherosclerotic heart disease of native coronary artery without angina pectoris: Secondary | ICD-10-CM | POA: Diagnosis not present

## 2015-11-18 LAB — HEPATIC FUNCTION PANEL
ALBUMIN: 4.4 g/dL (ref 3.6–5.1)
ALT: 16 U/L (ref 6–29)
AST: 15 U/L (ref 10–35)
Alkaline Phosphatase: 126 U/L (ref 33–130)
BILIRUBIN DIRECT: 0.1 mg/dL (ref <=0.2)
Indirect Bilirubin: 0.4 mg/dL (ref 0.2–1.2)
TOTAL PROTEIN: 6.7 g/dL (ref 6.1–8.1)
Total Bilirubin: 0.5 mg/dL (ref 0.2–1.2)

## 2015-11-18 LAB — BASIC METABOLIC PANEL
BUN: 7 mg/dL (ref 7–25)
CO2: 29 mmol/L (ref 20–31)
Calcium: 9.4 mg/dL (ref 8.6–10.4)
Chloride: 96 mmol/L — ABNORMAL LOW (ref 98–110)
Creat: 0.77 mg/dL (ref 0.50–1.05)
GLUCOSE: 83 mg/dL (ref 65–99)
POTASSIUM: 4.3 mmol/L (ref 3.5–5.3)
SODIUM: 133 mmol/L — AB (ref 135–146)

## 2015-11-18 LAB — LIPID PANEL
CHOL/HDL RATIO: 2.4 ratio (ref <=5.0)
CHOLESTEROL: 113 mg/dL — AB (ref 125–200)
HDL: 47 mg/dL (ref >=46)
LDL Cholesterol: 45 mg/dL (ref <130)
Triglycerides: 107 mg/dL (ref <150)
VLDL: 21 mg/dL (ref <30)

## 2015-11-18 NOTE — Patient Instructions (Signed)
Medication Instructions:   NO CHANGE  Labwork:  Your physician recommends that you HAVE LAB WORK TODAY  Follow-Up:  Your physician wants you to follow-up in: 6 MONTHS WITH DR CRENSHAW IN [place] You will receive a reminder letter in the mail two months in advance. If you don't receive a letter, please call our office to schedule the follow-up appointment.   If you need a refill on your cardiac medications before your next appointment, please call your pharmacy.    

## 2015-11-26 ENCOUNTER — Encounter (HOSPITAL_BASED_OUTPATIENT_CLINIC_OR_DEPARTMENT_OTHER): Payer: Self-pay

## 2015-11-26 ENCOUNTER — Emergency Department (HOSPITAL_BASED_OUTPATIENT_CLINIC_OR_DEPARTMENT_OTHER)
Admission: EM | Admit: 2015-11-26 | Discharge: 2015-11-26 | Discharge status: Against Medical Advice | Payer: Medicare Other | Attending: Emergency Medicine | Admitting: Emergency Medicine

## 2015-11-26 ENCOUNTER — Telehealth: Payer: Self-pay | Admitting: Neurology

## 2015-11-26 DIAGNOSIS — F329 Major depressive disorder, single episode, unspecified: Secondary | ICD-10-CM | POA: Insufficient documentation

## 2015-11-26 DIAGNOSIS — Z79899 Other long term (current) drug therapy: Secondary | ICD-10-CM | POA: Diagnosis not present

## 2015-11-26 DIAGNOSIS — I2510 Atherosclerotic heart disease of native coronary artery without angina pectoris: Secondary | ICD-10-CM | POA: Insufficient documentation

## 2015-11-26 DIAGNOSIS — M5442 Lumbago with sciatica, left side: Secondary | ICD-10-CM | POA: Insufficient documentation

## 2015-11-26 DIAGNOSIS — F17210 Nicotine dependence, cigarettes, uncomplicated: Secondary | ICD-10-CM | POA: Insufficient documentation

## 2015-11-26 DIAGNOSIS — M5432 Sciatica, left side: Secondary | ICD-10-CM

## 2015-11-26 DIAGNOSIS — Z7982 Long term (current) use of aspirin: Secondary | ICD-10-CM | POA: Diagnosis not present

## 2015-11-26 DIAGNOSIS — I10 Essential (primary) hypertension: Secondary | ICD-10-CM | POA: Diagnosis not present

## 2015-11-26 DIAGNOSIS — M545 Low back pain: Secondary | ICD-10-CM | POA: Diagnosis present

## 2015-11-26 MED ORDER — HYDROMORPHONE HCL 1 MG/ML IJ SOLN
1.0000 mg | Freq: Once | INTRAMUSCULAR | Status: AC
  Administered 2015-11-26: 1 mg via INTRAMUSCULAR

## 2015-11-26 MED ORDER — HYDROMORPHONE HCL 1 MG/ML IJ SOLN
INTRAMUSCULAR | Status: AC
  Filled 2015-11-26: qty 1

## 2015-11-26 MED ORDER — HYDROMORPHONE HCL 1 MG/ML IJ SOLN
1.0000 mg | Freq: Once | INTRAMUSCULAR | Status: AC
  Administered 2015-11-26: 1 mg via INTRAMUSCULAR
  Filled 2015-11-26: qty 1

## 2015-11-26 MED ORDER — GABAPENTIN 100 MG PO CAPS: 100.0000 mg | ORAL_CAPSULE | Freq: Three times a day (TID) | ORAL | Status: DC

## 2015-11-26 MED ORDER — DEXAMETHASONE SODIUM PHOSPHATE 10 MG/ML IJ SOLN
10.0000 mg | Freq: Once | INTRAMUSCULAR | Status: AC
  Administered 2015-11-26: 10 mg via INTRAMUSCULAR
  Filled 2015-11-26: qty 1

## 2015-11-26 NOTE — Telephone Encounter (Signed)
Patient called to request refill of gabapentin (NEURONTIN) 100 MG capsule and Gabapentin 300 MG to [facility] [address], [place].

## 2015-11-26 NOTE — ED Notes (Signed)
Pt arrived via [facility]. Pt c/o low back pain that radiates down L leg. Pt has a hx of MS and Sciatica. Pt denies recent injury.

## 2015-11-26 NOTE — ED Notes (Signed)
Pt yelling out " im  in pain and need something else "

## 2015-11-26 NOTE — ED Notes (Signed)
MD at bedside. 

## 2015-11-26 NOTE — ED Notes (Addendum)
Pt states she has a ride and is leaving, nurse apologized for the  Wait, pt states"  i im not going to get a script anyway" pt amb w/o difficulty

## 2015-11-26 NOTE — ED Notes (Signed)
Pt states she took a Percocet 5-325 this AM and valium at 15:00 today. Pt states the valium was expired.

## 2015-11-26 NOTE — ED Provider Notes (Signed)
CSN: [id number]     Arrival date & time 11/26/15  1739 History  By signing my name below, I, Freida Busman, attest that this documentation has been prepared under the direction and in the presence of Rolan Bucco, MD . Electronically Signed: Freida Busman, Scribe. 11/26/2015. 6:34 PM.    Chief Complaint  Patient presents with  . Back Pain    The history is provided by the patient. No language interpreter was used.   HPI Comments:  Summer Henderson is a 55 y.o. female who presents to the Emergency Department complaining of 10/10 back pain which began 6 days ago. Pt notes 6 days ago her pain began on right side; this am, she woke up with pain on her left side which radiates down her LLE, below the left knee. Pt has a h/o sciatica and notes her pain today is similar past episode. She denies weakness in the extremity. She denies recent fall/injury and recent bowel/bladder incontinence. Pt has a h/o MS denies acute symptoms due to her MS.  She has taken percocet and valium without relief.   Past Medical History  Diagnosis Date  . Multiple sclerosis (HCC)   . Chronic fatigue   . Cervical dysplasia   . GERD (gastroesophageal reflux disease)   . Carpal tunnel syndrome   . Prolonged QT interval   . Hypertension   . Mitral valve prolapse   . Anxiety   . Arthritis   . Depression   . Pneumonia   . CAD (coronary artery disease)     a. 06/2014 abnl MV;  b. 06/2014 Cath: LM nl, LAD 61m (2.75x28 Promus DES), LCX nl, RCA 60-3m (FFR = 0.80 -> 3.0x24 Promus DES), EF 55-65%.  Marland Kitchen RLS (restless legs syndrome) 04/28/2015   Past Surgical History  Procedure Laterality Date  . Rectovaginal fistula repair    . Abdominal hysterectomy    . Dilation and curettage of uterus    . Foot surgery Bilateral     For bunions  . Carpal tunnel release    . Bladder resuspension    . Hammer toe surgery Left   . Cervical spine surgery  10-16-2009  . Orif toe fracture Right   . Biceps tendon repair    . Prolong qtc       Heart  . Left heart catheterization with coronary angiogram N/A 06/29/2014    Procedure: LEFT HEART CATHETERIZATION WITH CORONARY ANGIOGRAM;  Surgeon: Micheline Chapman, MD;  Location[facility] CATH LAB;  Service: Cardiovascular;  Laterality: N/A;  . Coronary angioplasty with stent placement     Family History  Problem Relation Age of Onset  . Melanoma Mother   . Breast cancer Maternal Grandmother   . Brain cancer Maternal Grandfather   . Lung cancer Maternal Grandfather   . Colon cancer Maternal Grandmother   . Colon polyps Mother   . Diabetes Mother   . Diabetes Maternal Grandfather   . Kidney disease Neg Hx   . Esophageal cancer Neg Hx   . Gallbladder disease Neg Hx    Social History  Substance Use Topics  . Smoking status: Current Every Day Smoker -- 0.50 packs/day for 35 years    Types: Cigarettes    Last Attempt to Quit: 06/30/2014  . Smokeless tobacco: Never Used  . Alcohol Use: No   OB History    No data available     Review of Systems  Constitutional: Negative for fever and chills.  Respiratory: Negative for shortness of breath.  Cardiovascular: Negative for chest pain.  Musculoskeletal: Positive for back pain.  Neurological: Negative for weakness and numbness.  All other systems reviewed and are negative.  Allergies  Demerol; Erythromycin; Morphine and related; Sulfa antibiotics; Diflucan; Other; Sulfacetamide sodium; Erythromycin base; Fesoterodine; Tape; and Toviaz  Home Medications   Prior to Admission medications   Medication Sig Start Date End Date Taking? Authorizing Provider  aspirin 81 MG tablet Take 1 tablet (81 mg total) by mouth daily. 07/24/14   Lewayne Bunting, MD  atorvastatin (LIPITOR) 40 MG tablet Take 1 tablet (40 mg total) by mouth daily at 6 PM. 09/27/15   Lewayne Bunting, MD  clonazePAM (KLONOPIN) 0.25 MG disintegrating tablet Take 1 tablet (0.25 mg total) by mouth 2 (two) times daily as needed. 10/11/12   York Spaniel, MD  Dimethyl Fumarate  (TECFIDERA) 240 MG CPDR Take 1 capsule (240 mg total) by mouth 2 (two) times daily. 06/16/15   York Spaniel, MD  DULoxetine (CYMBALTA) 60 MG capsule TAKE 1 CAPSULE (60 MG TOTAL) BY MOUTH 2 (TWO) TIMES DAILY. 09/13/15   York Spaniel, MD  ENABLEX 7.5 MG 24 hr tablet Take 1 tablet by mouth daily. 06/18/14   Historical Provider, MD  esomeprazole (NEXIUM) 40 MG capsule TAKE 1 CAPSULE (40 MG TOTAL) BY MOUTH DAILY AT 12 NOON. 07/14/15   Lewayne Bunting, MD  gabapentin (NEURONTIN) 100 MG capsule Take 1 capsule (100 mg total) by mouth 3 (three) times daily. 11/26/15   York Spaniel, MD  losartan-hydrochlorothiazide (HYZAAR) 100-12.5 MG tablet Take 1 tablet by mouth daily. 08/03/15   Lewayne Bunting, MD  methylphenidate (RITALIN) 20 MG tablet Take 1 tablet (20 mg total) by mouth daily. At noon 11/17/15   York Spaniel, MD  metoprolol succinate (TOPROL-XL) 50 MG 24 hr tablet Take 1 tablet (50 mg total) by mouth daily. PLEASE CONTACT OFFICE FOR ADDITIONAL REFILLS 09/24/15   Lewayne Bunting, MD  modafinil (PROVIGIL) 200 MG tablet Take 1 tablet (200 mg total) by mouth daily. 07/27/14   York Spaniel, MD  NASONEX 50 MCG/ACT nasal spray Place 50 sprays into the nose daily. 09/16/12   Historical Provider, MD  oxyCODONE-acetaminophen (PERCOCET) 10-325 MG tablet Take 1 tablet by mouth every 4 (four) hours as needed for pain (one tablet every 6 hours prn foot pain). 09/09/15   Lenn Sink, DPM  potassium chloride (K-DUR) 10 MEQ tablet Take 1 tablet (10 mEq total) by mouth daily. Patient not taking: Reported on 11/18/2015 06/26/15   Jerelyn Scott, MD   BP 121/81 mmHg  Pulse 99  Temp(Src) 98.4 F (36.9 C) (Oral)  Resp 18  Ht  (1.702 m)  Wt 147 lb (66.679 kg)  BMI 23.02 kg/m2  SpO2 94% Physical Exam  Constitutional: She is oriented to person, place, and time. She appears well-developed and well-nourished. No distress.  HENT:  Head: Normocephalic and atraumatic.  Eyes: EOM are normal.  Neck: Normal  range of motion.  Cardiovascular: Normal rate, regular rhythm and normal heart sounds.   Pulmonary/Chest: Effort normal and breath sounds normal.  Abdominal: Soft. She exhibits no distension. There is no tenderness.  Musculoskeletal: Normal range of motion.  Positive tenderness over the left lumbar area and over the left sciatic area. There is no spinal tenderness. She has normal motor function and sensation in the extremities. Negative straight leg raise.  Neurological: She is alert and oriented to person, place, and time.  Skin: Skin is warm and  dry.  Psychiatric: She has a normal mood and affect. Judgment normal.  Nursing note and vitals reviewed.   ED Course  Procedures   DIAGNOSTIC STUDIES:  Oxygen Saturation is 98% on RA, normal by my interpretation.    COORDINATION OF CARE:  6:34 PM Will order pain meds and steroids. Discussed treatment plan with pt at bedside and pt agreed to plan.    MDM   Final diagnoses:  Sciatica of left side    Patient presents with radicular pain, consistent with sciatica. She has no neurologic deficits or signs of cauda equina. She has urinary incontinence related to her MS but this is unchanged from her baseline. She was treated with Dilaudid in the ED. She became impatient and it is of leaving prior to completion of her treatment and discharge. She has been ambulating around the ED without difficulty.  I personally performed the services described in this documentation, which was scribed in my presence.  The recorded information has been reviewed and considered.    Rolan Bucco, MD 11/26/15 [phone]

## 2015-11-26 NOTE — ED Notes (Signed)
Pt's mother called to check on patient, per pt's request, updated mom that pt was doing fine and has seen the doctor.

## 2015-11-26 NOTE — ED Notes (Signed)
Pt appears to be resting  texting on phone

## 2015-11-26 NOTE — Telephone Encounter (Signed)
I called patient. She was given gabapentin through this office in 2013, she just recently found a bottle of the medication as she is having more neuro half a pain in the hips and legs over the last several days. I will call in a prescription for gabapentin taking 100 mg 3 times daily. In the past, she could not take 300 mg during the daytime secondary to drowsiness.

## 2015-11-26 NOTE — ED Notes (Signed)
Pt c/o severe back pain ," those shots hasn't done anything for me im leaving " , instructed pt i would relay message to MD , MD made aware , Charge nurse also made aware

## 2015-11-26 NOTE — ED Notes (Signed)
Pt walking to BR with one assist

## 2015-11-26 NOTE — ED Notes (Signed)
Pt states she  Is going to leave if "I is not getting anymore pain medication or prescriptions to go home with" informed pt that MD will be in as soon as she can

## 2015-11-29 MED ORDER — OXYCODONE-ACETAMINOPHEN 5-325 MG PO TABS: 1.0000 | ORAL_TABLET | Freq: Four times a day (QID) | ORAL | 0 refills | Status: DC | PRN

## 2015-11-29 MED ORDER — METHOCARBAMOL 500 MG PO TABS: 500.0000 mg | ORAL_TABLET | Freq: Three times a day (TID) | ORAL | 1 refills | Status: DC

## 2015-11-29 NOTE — Telephone Encounter (Signed)
Roxicet rx printed, signed, up front for pick-up.

## 2015-11-29 NOTE — Telephone Encounter (Signed)
Pt called said she went to ED on Saturday 11/27/15 the pain in the hip was so bad. She said the ED said she had sciatic nerve problems. She was given 2 pain shots but these did not help. Please call

## 2015-11-29 NOTE — Telephone Encounter (Signed)
I called patient. The patient has had worsening of the back pain and leg discomfort, she went to the ER for an evaluation. She is to have thumb surgery this week, she indicates that she cannot take steroids. We will give her another prescription for oxycodone, and methocarbamol. If the pain continues, we will need to see her in the office, consider MRI of the lumbar spine. She is to call if she does not improve. She apparently has had this similar problem in the past.

## 2015-11-29 NOTE — Addendum Note (Signed)
Addended by: Stephanie Acre on: 11/29/2015 01:19 PM   Modules accepted: Orders

## 2015-12-08 IMAGING — CR DG RIBS W/ CHEST 3+V*L*
3 series · 3 of 3 positions shown · non-contrast
Comparison: Chest radiographs 10/05/2009

CLINICAL DATA: Left-sided rib pain.

EXAM:
LEFT RIBS AND CHEST - 3+ VIEW

[w chest pa]
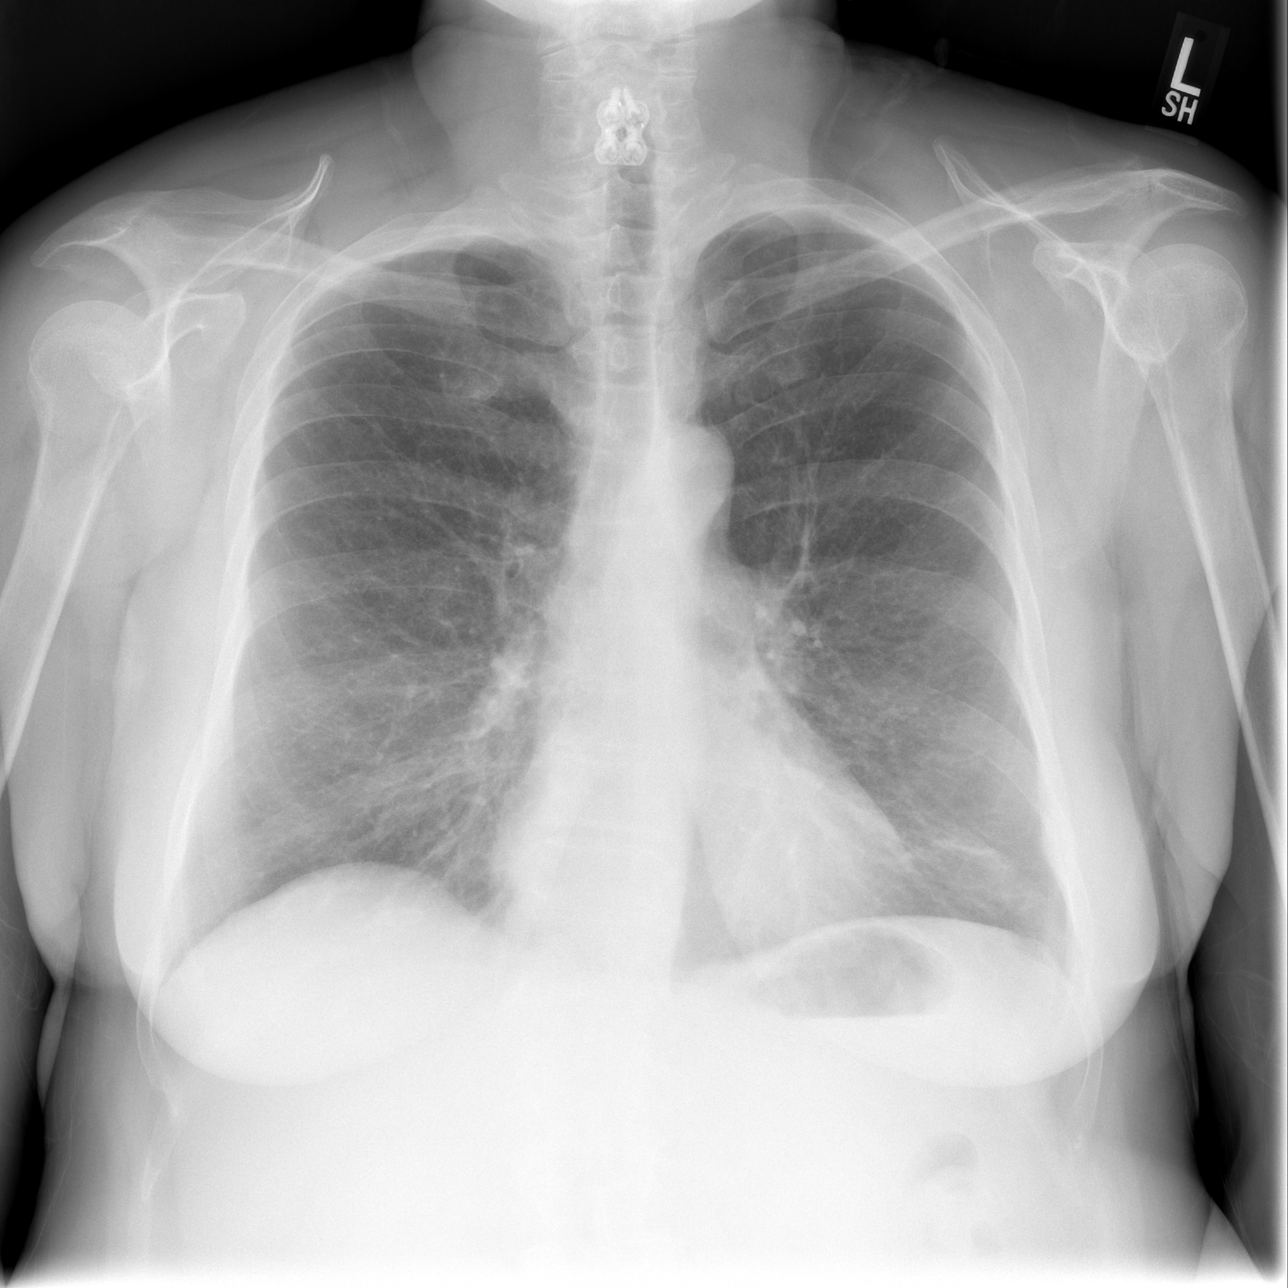

[w ribs ap/pa upper left]
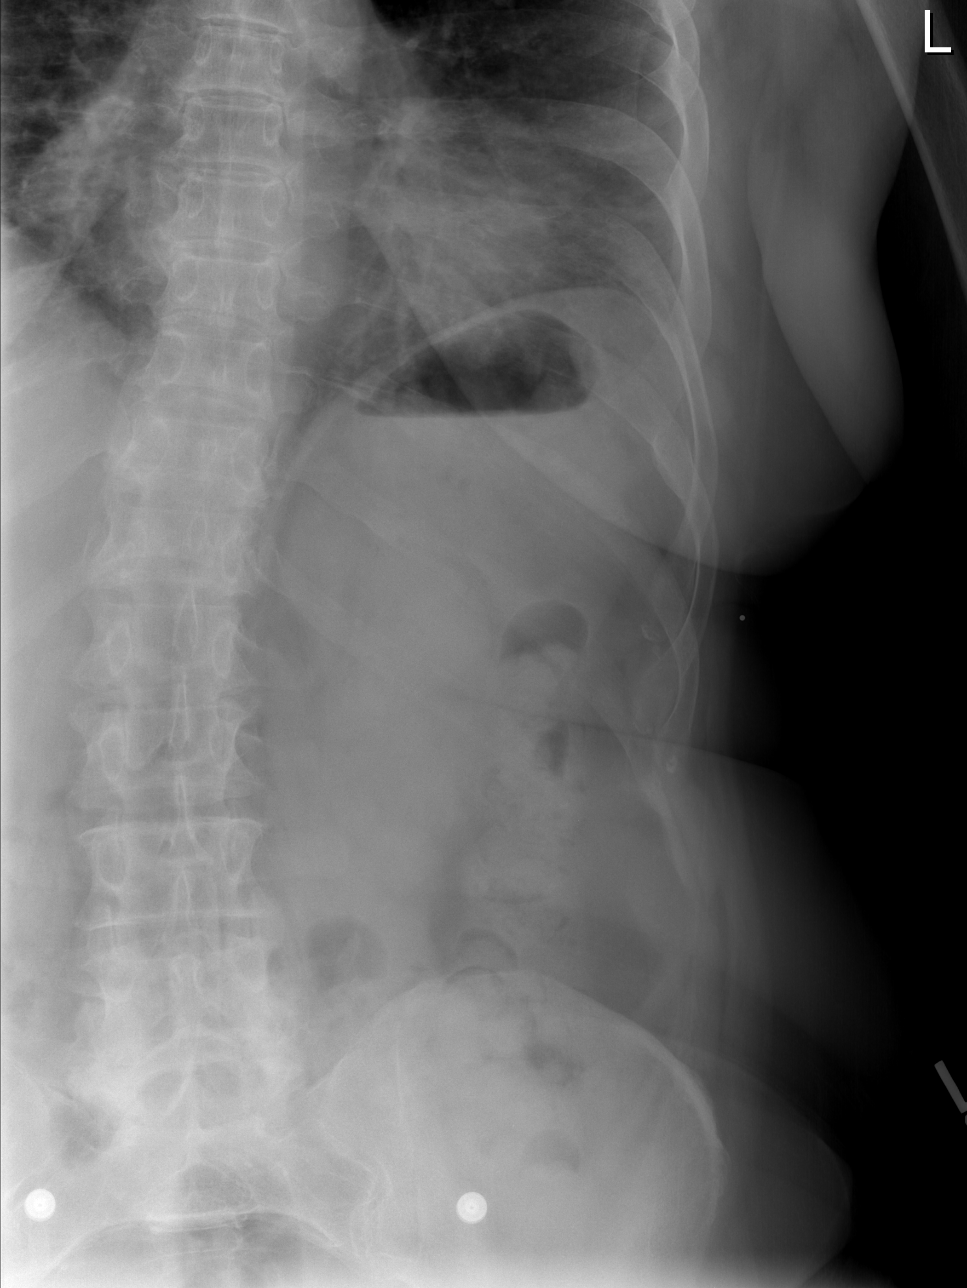

[w ribs ap/pa lower left]
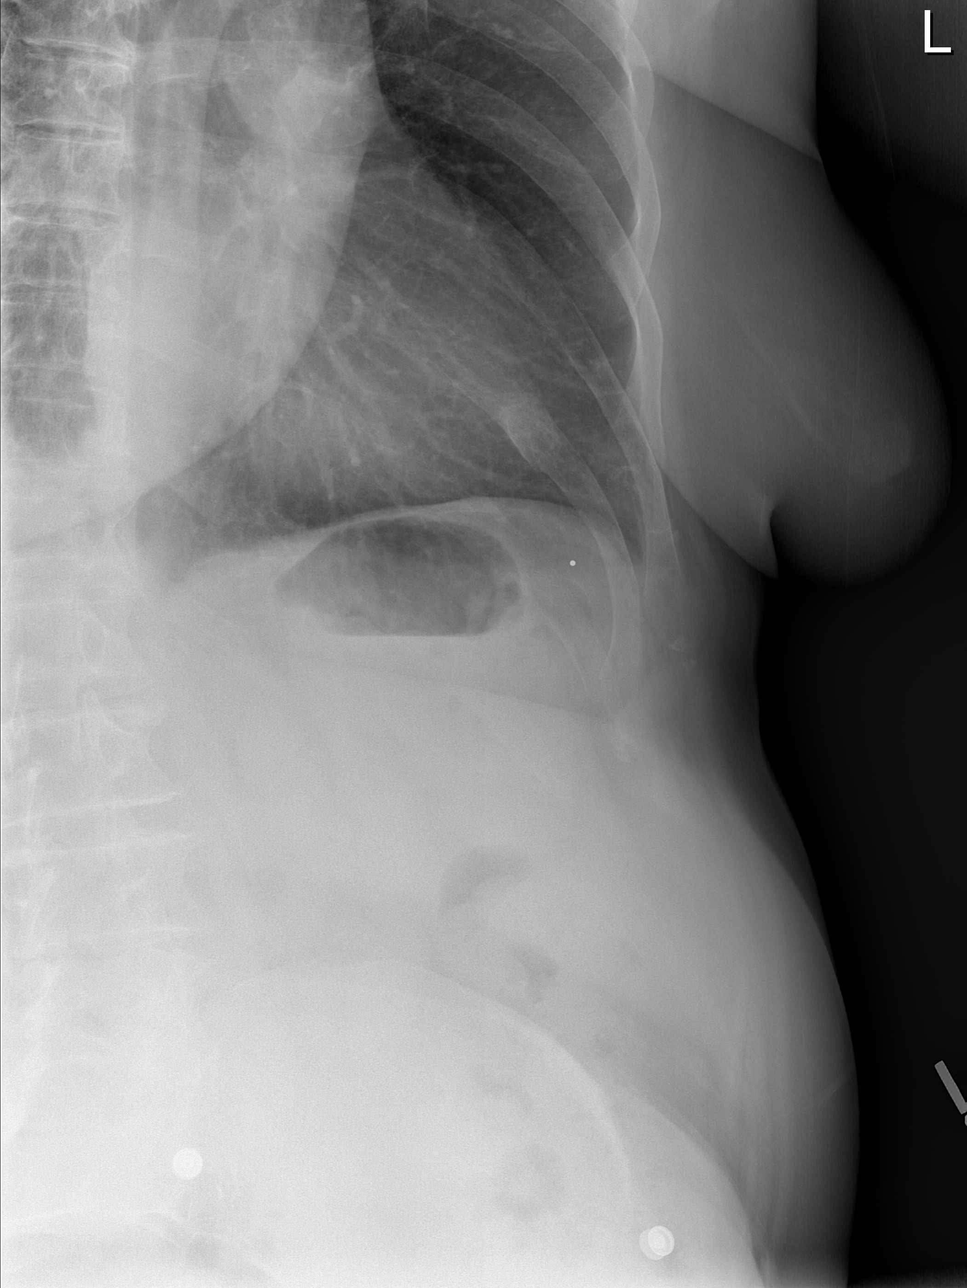

[3 of 3 positions shown; findings below may reference images not displayed]

FINDINGS: The cardiomediastinal silhouette is within normal limits. Linear
opacity in the left lung base is unchanged, likely scarring. Mild
central bronchitic changes are again seen. No pleural effusion or
pneumothorax is identified. There is an old, healed left posterior
lateral ninth rib fracture. No acute rib fracture is identified.
IMPRESSION: Old left ninth rib fracture.  No acute fracture identified.

## 2015-12-21 ENCOUNTER — Telehealth: Payer: Self-pay | Admitting: Cardiology

## 2015-12-21 DIAGNOSIS — E876 Hypokalemia: Secondary | ICD-10-CM

## 2015-12-21 NOTE — Telephone Encounter (Signed)
Spoke with pt, she has had surgery on her tumb and is having to take IV antibiotics at the hospital. The nurse told her today her potassium was 3.1. I am unable to see the results in my chart. I will call [facility] tomorrow morning to get the lab results. [phone].

## 2015-12-21 NOTE — Telephone Encounter (Signed)
Summer Henderson is calling because her potassium is down to 3.1 and she has been feeling really tired . Also when she saw him last month and they did lab work and she has not heard anything about the results . Please call   Thanks

## 2015-12-22 ENCOUNTER — Other Ambulatory Visit: Payer: Self-pay | Admitting: Neurology

## 2015-12-22 DIAGNOSIS — G35 Multiple sclerosis: Secondary | ICD-10-CM

## 2015-12-22 MED ORDER — METHYLPHENIDATE HCL 20 MG PO TABS: 20.0000 mg | ORAL_TABLET | Freq: Every day | ORAL | 0 refills | Status: DC

## 2015-12-22 NOTE — Telephone Encounter (Signed)
OV 10/20/15 FU 04/20/16 RF 11/17/15

## 2015-12-22 NOTE — Telephone Encounter (Signed)
Labs received and sent for dr crenshaw's review. Pt made aware.

## 2015-12-22 NOTE — Telephone Encounter (Signed)
Patient requesting refill ofmethylphenidate (RITALIN) 20 MG tablet   Pt request to pick up tomorrow afternoon

## 2015-12-22 NOTE — Telephone Encounter (Signed)
Spoke with constance at [facility], they are going to fax the lab results to me.

## 2015-12-22 NOTE — Addendum Note (Signed)
Addended by: Donnelly AngelicaHOGAN, Anne Boltz L on: 12/22/2015 04:56 PM   Modules accepted: Orders

## 2015-12-23 NOTE — Telephone Encounter (Signed)
Rx printed, signed, up front for pick-up. 

## 2015-12-27 MED ORDER — POTASSIUM CHLORIDE CRYS ER 20 MEQ PO TBCR: EXTENDED_RELEASE_TABLET | ORAL | 0 refills | Status: DC

## 2015-12-27 NOTE — Telephone Encounter (Signed)
F/u     Pt calling to report that her potassium is at 2.8. She states that she is tired and would like to speak with the nurse. Please call.

## 2015-12-27 NOTE — Telephone Encounter (Signed)
Returned patient call-pt reports she had lab work completed today and her Potassium is 2.8.  Reports feeling fatigued.    Contacted [facility] and spoke to [place].  Idell PicklesDella to fax lab results to [phone].    Will take to DOD once results received.   Pt aware of plan and verbalized understanding.    Spoke with MD [place], DOD.  Recommended 40 meq potassium daily x 5 days followed by 20meq potassium daily.  Repeat BMET in 2-3 weeks.  Rx sent to pharmacy-lab work order placed.    Pt aware and verbalized understanding.  Pt also requesting RN to send MD Crenshaw a message and request him to call patient if possible.    Will route to MD Crenshaw to make aware.

## 2016-01-12 LAB — BASIC METABOLIC PANEL
BUN: 6 mg/dL — AB (ref 7–25)
CO2: 26 mmol/L (ref 20–31)
Calcium: 9.1 mg/dL (ref 8.6–10.4)
Chloride: 99 mmol/L (ref 98–110)
Creat: 0.71 mg/dL (ref 0.50–1.05)
GLUCOSE: 100 mg/dL — AB (ref 65–99)
POTASSIUM: 4.1 mmol/L (ref 3.5–5.3)
Sodium: 131 mmol/L — ABNORMAL LOW (ref 135–146)

## 2016-01-31 ENCOUNTER — Other Ambulatory Visit: Payer: Self-pay | Admitting: Neurology

## 2016-01-31 DIAGNOSIS — G35 Multiple sclerosis: Secondary | ICD-10-CM

## 2016-01-31 MED ORDER — METHYLPHENIDATE HCL 20 MG PO TABS: 20.0000 mg | ORAL_TABLET | Freq: Every day | ORAL | 0 refills | Status: DC

## 2016-01-31 NOTE — Telephone Encounter (Signed)
Rx printed, signed, up front for pick-up. 

## 2016-01-31 NOTE — Telephone Encounter (Signed)
Patient requesting refill of methylphenidate (RITALIN) 20 MG tablet °Pharmacy pick up ° ° °

## 2016-01-31 NOTE — Telephone Encounter (Signed)
Last OV was in June and pt has f/u appt scheduled w/ Megan NP in Dec. Last rx was 12/22/15.

## 2016-02-14 ENCOUNTER — Other Ambulatory Visit: Payer: Self-pay | Admitting: Cardiology

## 2016-02-14 NOTE — Telephone Encounter (Signed)
Rx request sent to pharmacy.  

## 2016-02-16 ENCOUNTER — Other Ambulatory Visit: Payer: Self-pay

## 2016-02-16 ENCOUNTER — Telehealth: Payer: Self-pay | Admitting: Neurology

## 2016-02-16 MED ORDER — DIMETHYL FUMARATE 240 MG PO CPDR: 240.0000 mg | DELAYED_RELEASE_CAPSULE | Freq: Two times a day (BID) | ORAL | 3 refills | Status: DC

## 2016-02-16 NOTE — Telephone Encounter (Signed)
Patient called, states she received a call from [facility] [address] that refill for Dimethyl Fumarate (TECFIDERA) 240 MG CPDR was received, they have advised patient they are going to delete this refill request, this Rx is filled through [facility].

## 2016-02-16 NOTE — Telephone Encounter (Signed)
Rx re[facility] to [facility].

## 2016-02-29 ENCOUNTER — Telehealth: Payer: Self-pay

## 2016-02-29 MED ORDER — GABAPENTIN 100 MG PO CAPS: 100.0000 mg | ORAL_CAPSULE | Freq: Three times a day (TID) | ORAL | 0 refills | Status: DC

## 2016-02-29 NOTE — Telephone Encounter (Signed)
90 day refill request

## 2016-03-07 ENCOUNTER — Other Ambulatory Visit: Payer: Self-pay | Admitting: Neurology

## 2016-03-07 DIAGNOSIS — G35 Multiple sclerosis: Secondary | ICD-10-CM

## 2016-03-07 MED ORDER — METHYLPHENIDATE HCL 20 MG PO TABS: 20.0000 mg | ORAL_TABLET | Freq: Every day | ORAL | 0 refills | Status: DC

## 2016-03-07 NOTE — Telephone Encounter (Signed)
Pt was last seen in June by Aundra Millet NP and has a f/u scheduled in Dec. Last rx was written 01/31/16.

## 2016-03-07 NOTE — Telephone Encounter (Signed)
Rx printed, signed, up front for pick-up. 

## 2016-03-07 NOTE — Telephone Encounter (Signed)
Patient requesting refill of methylphenidate (RITALIN) 20 MG tablet °Pharmacy pick up ° ° °

## 2016-03-09 ENCOUNTER — Other Ambulatory Visit: Payer: Self-pay | Admitting: Neurology

## 2016-04-02 ENCOUNTER — Other Ambulatory Visit: Payer: Self-pay | Admitting: Cardiology

## 2016-04-03 NOTE — Telephone Encounter (Signed)
REFILL 

## 2016-04-10 ENCOUNTER — Other Ambulatory Visit: Payer: Self-pay | Admitting: Neurology

## 2016-04-10 DIAGNOSIS — G35 Multiple sclerosis: Secondary | ICD-10-CM

## 2016-04-10 MED ORDER — METHYLPHENIDATE HCL 20 MG PO TABS: 20.0000 mg | ORAL_TABLET | Freq: Every day | ORAL | 0 refills | Status: DC

## 2016-04-10 NOTE — Telephone Encounter (Signed)
Pt was seen in June for OV and has follow-up scheduled this month. Last rx written 03/07/16.

## 2016-04-10 NOTE — Addendum Note (Signed)
Addended by: Donnelly AngelicaHOGAN, JENNIFER L on: 04/10/2016 09:37 AM   Modules accepted: Orders

## 2016-04-10 NOTE — Telephone Encounter (Signed)
Patient requesting refill of methylphenidate (RITALIN) 20 MG tablet. I advised the Rx will be ready in 24 hours unless the nurse advises otherwise.

## 2016-04-20 ENCOUNTER — Ambulatory Visit: Payer: Medicare Other | Admitting: Adult Health

## 2016-04-23 ENCOUNTER — Other Ambulatory Visit: Payer: Self-pay | Admitting: Cardiology

## 2016-05-16 ENCOUNTER — Telehealth: Payer: Self-pay | Admitting: Neurology

## 2016-05-16 DIAGNOSIS — G35 Multiple sclerosis: Secondary | ICD-10-CM

## 2016-05-16 MED ORDER — MODAFINIL 200 MG PO TABS: 200.0000 mg | ORAL_TABLET | Freq: Every day | ORAL | 5 refills | Status: DC

## 2016-05-16 MED ORDER — METHYLPHENIDATE HCL 20 MG PO TABS: 20.0000 mg | ORAL_TABLET | Freq: Every day | ORAL | 0 refills | Status: DC

## 2016-05-16 NOTE — Telephone Encounter (Signed)
Pt requesting refill for methylphenidate (RITALIN) 20 MG tablet and modafinil (PROVIGIL) 200 MG tablet.

## 2016-05-16 NOTE — Telephone Encounter (Signed)
Refills for Provigil and Ritalin were given.

## 2016-05-16 NOTE — Addendum Note (Signed)
Addended by: Stephanie Acre on: 05/16/2016 05:01 PM   Modules accepted: Orders

## 2016-05-17 NOTE — Telephone Encounter (Signed)
Rx printed, signed, up front for pick-up. 

## 2016-05-18 ENCOUNTER — Telehealth: Payer: Self-pay

## 2016-05-18 NOTE — Telephone Encounter (Signed)
PA started on Modafinil in Coverm[employer]eds.  Key: Summer Henderson

## 2016-05-18 NOTE — Telephone Encounter (Signed)
Approved today  Request Reference Number: [id number]. MODAFINIL TAB 200MG  is approved through 11/15/2016. For further questions, call [phone].

## 2016-05-25 ENCOUNTER — Ambulatory Visit: Payer: Medicare Other | Admitting: Adult Health

## 2016-06-07 ENCOUNTER — Ambulatory Visit (INDEPENDENT_AMBULATORY_CARE_PROVIDER_SITE_OTHER): Payer: Medicare Other | Admitting: Adult Health

## 2016-06-07 ENCOUNTER — Encounter: Payer: Self-pay | Admitting: Adult Health

## 2016-06-07 VITALS — BP 130/82 | HR 88 | Ht 67.0 in | Wt 149.4 lb

## 2016-06-07 DIAGNOSIS — Z5181 Encounter for therapeutic drug level monitoring: Secondary | ICD-10-CM | POA: Diagnosis not present

## 2016-06-07 DIAGNOSIS — G35 Multiple sclerosis: Secondary | ICD-10-CM | POA: Diagnosis not present

## 2016-06-07 DIAGNOSIS — R269 Unspecified abnormalities of gait and mobility: Secondary | ICD-10-CM

## 2016-06-07 NOTE — Progress Notes (Signed)
I have read the note, and I agree with the clinical assessment and plan.  David Towson KEITH   

## 2016-06-07 NOTE — Progress Notes (Signed)
PATIENT: Summer Henderson DOB: 1961-02-09  REASON FOR VISIT: follow up- multiple sclerosis HISTORY FROM: patient  HISTORY OF PRESENT ILLNESS: Ms. Summer Henderson is a 56 year old female with a history multiple sclerosis. She returns today for follow-up. She remains on Tecfidera. Although she does report missing doses occasionally. She denies any new numbness or weakness. She feels that over time her gait and balance has gotten slightly worse. Denies any falls. Denies any changes with her vision. She reports that she has noticed a droopy eyelid on the right. However she has only noticed this in pictures. Denies any diplopia or blurry vision. Denies any significant changes with bowels or bladder. She continues on Cymbalta with good benefit. She is no longer using clonazepam. She uses Robaxin for muscle spasms as needed. Reports that Ritalin works well for her daytime sleepiness and fatigue. If she does not use Ritalin she will use Provigil. Patient returns today for an evaluation.  HISTORY 10/20/15: Ms. Summer Henderson is a 56 year old female with a history of multiple sclerosis. She returns today for follow-up. She is currently on Tecfidera and tolerating it well. She denies any new neurological symptoms. She does state that she had a fall at [facility] and fractured her foot. Since then her foot has healed. She has not had any additional falls. She reports that she does have AFO braces but she does not use them. She states that they do not fit in most of her shoes. She denies any new numbness or weakness. She does state that she has significant fatigue throughout the day.. Ritalin has helped. She states occasionally she will take Provigil midday but she has to be careful taking this medication as it does keep her awake at night. She does not use an assistive device when ambulating. She does have a cane but does not use it. She is adamant that a cane or walker will not prevent her falls. She returns today for an  evaluation.  HISTORY 04/28/15 (WILLIS): Ms. Summer Henderson is a 56 year old right-handed white female with a history of multiple sclerosis. The patient is on Tecfidera, she is tolerating this drug fairly well, she will have occasional hot flashes at times. The patient has had recent blood work done that included a liver profile that was unremarkable, but a CBC was not done. The patient last had MRI evaluation the brain in June 2014. She is having some new issues. She has noted that she is having some brief episodes of dizziness and a feeling of instability when she first stands up. She will note this if she has been sitting a long time, she generally does not have problems when she first gets up in the morning. The patient has not had any falls because of this. She did fall on 2 occasions in the summertime associated with significant heat exposure. The patient does have some gait instability, but she has not been regularly using a cane or walking stick when she is outside the house. She indicates that she has difficulty maintaining balance when she is standing still. She has a neurogenic bladder, she takes medication for this. She also reports some issues with restless leg syndrome that have been bothersome for her recently. She has gabapentin at home that she no longer uses. She was using this for neuromuscular pain. The patient continues to note some chronic issues with fatigue. She has gone back to smoking cigarettes again  REVIEW OF SYSTEMS: Out of a complete 14 system review of symptoms, the patient complains only  of the following symptoms, and all other reviewed systems are negative.  See history of present illness  ALLERGIES: Allergies  Allergen Reactions  . Demerol [Meperidine] Other (See Comments)    [derm] rash, swelling, itching Other reaction(s): Other (See Comments) [derm] rash, swelling, itching  . Erythromycin   . Morphine And Related   . Sulfa Antibiotics   . Diflucan [Fluconazole] Other  (See Comments)    States she has prolonged QT and cannot take this.  . Other     Other reaction(s): Other (See Comments) Z-pak causes EKG changes, was told by cardiologist not to take this.  . Sulfacetamide Sodium   . Erythromycin Base Rash    [Derm] rash  . Fesoterodine Palpitations  . Tape     Adhesive tape  . Toviaz [Fesoterodine Fumarate Er] Palpitations    HOME MEDICATIONS: Outpatient Medications Prior to Visit  Medication Sig Dispense Refill  . aspirin 81 MG tablet Take 1 tablet (81 mg total) by mouth daily. 30 tablet   . atorvastatin (LIPITOR) 40 MG tablet Take 1 tablet (40 mg total) by mouth daily at 6 PM. 90 tablet 3  . clonazePAM (KLONOPIN) 0.25 MG disintegrating tablet Take 1 tablet (0.25 mg total) by mouth 2 (two) times daily as needed. 60 tablet 1  . Dimethyl Fumarate (TECFIDERA) 240 MG CPDR Take 1 capsule (240 mg total) by mouth 2 (two) times daily. 180 capsule 3  . DULoxetine (CYMBALTA) 60 MG capsule TAKE 1 CAPSULE (60 MG TOTAL) BY MOUTH 2 (TWO) TIMES DAILY. 180 capsule 1  . ENABLEX 7.5 MG 24 hr tablet Take 1 tablet by mouth daily.    Marland Kitchen esomeprazole (NEXIUM) 40 MG capsule TAKE 1 CAPSULE (40 MG TOTAL) BY MOUTH DAILY AT 12 NOON. 30 capsule 6  . gabapentin (NEURONTIN) 100 MG capsule Take 1 capsule (100 mg total) by mouth 3 (three) times daily. 270 capsule 0  . losartan-hydrochlorothiazide (HYZAAR) 100-12.5 MG tablet TAKE 1 TABLET BY MOUTH DAILY. 90 tablet 1  . methocarbamol (ROBAXIN) 500 MG tablet Take 1 tablet (500 mg total) by mouth 3 (three) times daily. 30 tablet 1  . methylphenidate (RITALIN) 20 MG tablet Take 1 tablet (20 mg total) by mouth daily. At noon 30 tablet 0  . metoprolol succinate (TOPROL-XL) 50 MG 24 hr tablet Take 1 tablet (50 mg total) by mouth daily. NEED OV. 90 tablet 0  . modafinil (PROVIGIL) 200 MG tablet Take 1 tablet (200 mg total) by mouth daily. 30 tablet 5  . NASONEX 50 MCG/ACT nasal spray Place 50 sprays into the nose daily.    Marland Kitchen  oxyCODONE-acetaminophen (PERCOCET) 10-325 MG tablet Take 1 tablet by mouth every 4 (four) hours as needed for pain (one tablet every 6 hours prn foot pain). 30 tablet 0  . oxyCODONE-acetaminophen (ROXICET) 5-325 MG tablet Take 1 tablet by mouth every 6 (six) hours as needed for severe pain. 60 tablet 0  . potassium chloride (K-DUR) 10 MEQ tablet Take 1 tablet (10 mEq total) by mouth daily. (Patient not taking: Reported on 11/18/2015) 20 tablet 0  . potassium chloride SA (K-DUR,KLOR-CON) 20 MEQ tablet Take 2 tablets (40 meq) for 5 days, then take 1 tablet ( ) daily. 45 tablet 0   No facility-administered medications prior to visit.     PAST MEDICAL HISTORY: Past Medical History:  Diagnosis Date  . Anxiety   . Arthritis   . CAD (coronary artery disease)    a. 06/2014 abnl MV;  b. 06/2014 Cath: LM nl, LAD  67m (2.75x28 Promus DES), LCX nl, RCA 60-32m (FFR = 0.80 -> 3.0x24 Promus DES), EF 55-65%.  . Carpal tunnel syndrome   . Cervical dysplasia   . Chronic fatigue   . Depression   . GERD (gastroesophageal reflux disease)   . Hypertension   . Mitral valve prolapse   . Multiple sclerosis (HCC)   . Pneumonia   . Prolonged QT interval   . RLS (restless legs syndrome) 04/28/2015    PAST SURGICAL HISTORY: Past Surgical History:  Procedure Laterality Date  . ABDOMINAL HYSTERECTOMY    . BICEPS TENDON REPAIR    . bladder resuspension    . CARPAL TUNNEL RELEASE    . CERVICAL SPINE SURGERY  10-16-2009  . CORONARY ANGIOPLASTY WITH STENT PLACEMENT    . DILATION AND CURETTAGE OF UTERUS    . FOOT SURGERY Bilateral    For bunions  . HAMMER TOE SURGERY Left   . LEFT HEART CATHETERIZATION WITH CORONARY ANGIOGRAM N/A 06/29/2014   Procedure: LEFT HEART CATHETERIZATION WITH CORONARY ANGIOGRAM;  Surgeon: Micheline Chapman, MD;  Location: [facility] CATH LAB;  Service: Cardiovascular;  Laterality: N/A;  . ORIF TOE FRACTURE Right   . Prolong QTC     Heart  . rectovaginal fistula repair      FAMILY  HISTORY: Family History  Problem Relation Age of Onset  . Melanoma Mother   . Breast cancer Maternal Grandmother   . Brain cancer Maternal Grandfather   . Lung cancer Maternal Grandfather   . Colon cancer Maternal Grandmother   . Colon polyps Mother   . Diabetes Mother   . Diabetes Maternal Grandfather   . Kidney disease Neg Hx   . Esophageal cancer Neg Hx   . Gallbladder disease Neg Hx     SOCIAL HISTORY: Social History   Social History  . Marital status: Divorced    Spouse name: N/A  . Number of children: 2  . Years of education: 13   Occupational History  . Disabled     Disability   Social History Main Topics  . Smoking status: Current Every Day Smoker    Packs/day: 0.50    Years: 35.00    Types: Cigarettes    Last attempt to quit: 06/30/2014  . Smokeless tobacco: Never Used  . Alcohol use No  . Drug use: No  . Sexual activity: Not on file   Other Topics Concern  . Not on file   Social History Narrative   Patient does not drink caffeine.   Patient is right handed.      PHYSICAL EXAM  Vitals:   06/07/16 1409  BP: 130/82  Pulse: 88  Weight: 149 lb 6.4 oz (67.8 kg)  Height: 5\' 7"  (1.702 m)   Body mass index is 23.4 kg/m.  Generalized: Well developed, in no acute distress   Neurological examination  Mentation: Alert oriented to time, place, history taking. Follows all commands speech and language fluent Cranial nerve II-XII: Pupils were equal round reactive to light. Extraocular movements were full, visual field were full on confrontational test. Facial sensation and strength were normal. Uvula tongue midline. Head turning and shoulder shrug  were normal and symmetric. Motor: The motor testing reveals 5 over 5 strength of all 4 extremities. Good symmetric motor tone is noted throughout.  Sensory: Sensory testing is intact to soft touch on all 4 extremities. No evidence of extinction is noted.  Coordination: Cerebellar testing reveals good  finger-nose-finger and heel-to-shin bilaterally.  Gait and station: Gait  is slightly unsteady. Tandem gait unsteady. Romberg is unsteady. Reflexes: Deep tendon reflexes are symmetric and normal bilaterally.   DIAGNOSTIC DATA (LABS, IMAGING, TESTING) - I reviewed patient records, labs, notes, testing and imaging myself where available.  Lab Results  Component Value Date   WBC 7.4 10/20/2015   HGB 11.3 (L) 06/26/2015   HCT 35.1 10/20/2015   MCV 80 10/20/2015   PLT 462 (H) 10/20/2015      Component Value Date/Time   NA 131 (L) 01/11/2016 1502   NA 136 10/20/2015 1338   K 4.1 01/11/2016 1502   CL 99 01/11/2016 1502   CO2 26 01/11/2016 1502   GLUCOSE 100 (H) 01/11/2016 1502   BUN 6 (L) 01/11/2016 1502   BUN 6 10/20/2015 1338   CREATININE 0.71 01/11/2016 1502   CALCIUM 9.1 01/11/2016 1502   PROT 6.7 11/18/2015 1123   PROT 6.5 10/20/2015 1338   ALBUMIN 4.4 11/18/2015 1123   ALBUMIN 4.3 10/20/2015 1338   AST 15 11/18/2015 1123   ALT 16 11/18/2015 1123   ALKPHOS 126 11/18/2015 1123   BILITOT 0.5 11/18/2015 1123   BILITOT 0.4 10/20/2015 1338   GFRNONAA 84 10/20/2015 1338   GFRNONAA >89 06/17/2014 1111   GFRAA 97 10/20/2015 1338   GFRAA >89 06/17/2014 1111   Lab Results  Component Value Date   CHOL 113 (L) 11/18/2015   HDL 47 11/18/2015   LDLCALC 45 11/18/2015   TRIG 107 11/18/2015   CHOLHDL 2.4 11/18/2015     ASSESSMENT AND PLAN 56 y.o. year old female  has a past medical history of Anxiety; Arthritis; CAD (coronary artery disease); Carpal tunnel syndrome; Cervical dysplasia; Chronic fatigue; Depression; GERD (gastroesophageal reflux disease); Hypertension; Mitral valve prolapse; Multiple sclerosis (HCC); Pneumonia; Prolonged QT interval; and RLS (restless legs syndrome) (04/28/2015). here with:  1. Multiple sclerosis 2. Gait abnormality  Overall the patient is doing well. She will continue taking Tecfidera. I will check blood work today. The patient never had her MRI  of the brain that was ordered at the last visit. This will be reordered for the patient. Patient advised that her symptoms worsen she should let us know. The patient did not have ptosis on exam today. I did discuss with Dr. Anne Hahn and it would be very unusual that multiple sclerosis would cause ptosis. Patient voiced understanding. She will follow-up in 6 months or sooner if needed.     Butch Penny, MSN, NP-C 06/07/2016, 2:12 PM [facility] [address] [place], [place] [address] [phone]

## 2016-06-07 NOTE — Patient Instructions (Signed)
Continue Tecfidera Blood work today MRI order will be sent to [facility] If your symptoms worsen or you develop new symptoms please let us know.

## 2016-06-08 ENCOUNTER — Telehealth: Payer: Self-pay | Admitting: Adult Health

## 2016-06-08 LAB — CBC WITH DIFFERENTIAL/PLATELET
BASOS ABS: 0 10*3/uL (ref 0.0–0.2)
Basos: 0 %
EOS (ABSOLUTE): 0 10*3/uL (ref 0.0–0.4)
Eos: 0 %
HEMOGLOBIN: 12.7 g/dL (ref 11.1–15.9)
Hematocrit: 37.6 % (ref 34.0–46.6)
IMMATURE GRANULOCYTES: 0 %
Immature Grans (Abs): 0 10*3/uL (ref 0.0–0.1)
LYMPHS ABS: 3 10*3/uL (ref 0.7–3.1)
Lymphs: 31 %
MCH: 28 pg (ref 26.6–33.0)
MCHC: 33.8 g/dL (ref 31.5–35.7)
MCV: 83 fL (ref 79–97)
MONOCYTES: 7 %
MONOS ABS: 0.7 10*3/uL (ref 0.1–0.9)
NEUTROS PCT: 62 %
Neutrophils Absolute: 6.1 10*3/uL (ref 1.4–7.0)
Platelets: 438 10*3/uL — ABNORMAL HIGH (ref 150–379)
RBC: 4.54 x10E6/uL (ref 3.77–5.28)
RDW: 16.5 % — AB (ref 12.3–15.4)
WBC: 9.8 10*3/uL (ref 3.4–10.8)

## 2016-06-08 LAB — COMPREHENSIVE METABOLIC PANEL
ALBUMIN: 4.4 g/dL (ref 3.5–5.5)
ALK PHOS: 138 IU/L — AB (ref 39–117)
ALT: 19 IU/L (ref 0–32)
AST: 18 IU/L (ref 0–40)
Albumin/Globulin Ratio: 1.7 (ref 1.2–2.2)
BILIRUBIN TOTAL: 0.4 mg/dL (ref 0.0–1.2)
BUN / CREAT RATIO: 7 — AB (ref 9–23)
BUN: 5 mg/dL — AB (ref 6–24)
CO2: 24 mmol/L (ref 18–29)
Calcium: 9.4 mg/dL (ref 8.7–10.2)
Chloride: 92 mmol/L — ABNORMAL LOW (ref 96–106)
Creatinine, Ser: 0.76 mg/dL (ref 0.57–1.00)
GFR calc Af Amer: 104 mL/min/{1.73_m2} (ref >59)
GFR calc non Af Amer: 90 mL/min/{1.73_m2} (ref >59)
GLUCOSE: 66 mg/dL (ref 65–99)
Globulin, Total: 2.6 g/dL (ref 1.5–4.5)
Potassium: 4.1 mmol/L (ref 3.5–5.2)
Sodium: 135 mmol/L (ref 134–144)
Total Protein: 7 g/dL (ref 6.0–8.5)

## 2016-06-08 MED ORDER — ALPRAZOLAM 0.5 MG PO TABS: ORAL_TABLET | ORAL | 0 refills | Status: DC

## 2016-06-08 NOTE — Telephone Encounter (Signed)
I called patient. I will call in a small prescription for alprazolam for the MRI study. She will have somebody else drive her back from the exam.

## 2016-06-08 NOTE — Telephone Encounter (Signed)
The patient is schedule to have her MRI at [facility] in [place] for 06/15/16 and she informed me that she is claustrophobia and needs something to calm her nerves.. The best number to reach patient is [phone]

## 2016-06-09 ENCOUNTER — Telehealth: Payer: Self-pay | Admitting: Adult Health

## 2016-06-09 NOTE — Telephone Encounter (Deleted)
-----   Message from Butch Penny, NP sent at 06/09/2016 12:49 PM EST -----   ----- Message ----- From: Guy Begin, RN Sent: 06/09/2016  12:08 PM To: York Spaniel, MD    ----- Message ----- From: Interface, [facility] Lab Results In Sent: 06/08/2016   5:42 AM To: Butch Penny, NP

## 2016-06-09 NOTE — Telephone Encounter (Signed)
-----   Message from Correen Bubolz, NP sent at 06/09/2016 12:49 PM EST -----   ----- Message ----- From: Sandra S Young, RN Sent: 06/09/2016  12:08 PM To: Charles K Willis, MD    ----- Message ----- From: Interface, [facility] Lab Results In Sent: 06/08/2016   5:42 AM To: Zafar Debrosse, NP   

## 2016-06-09 NOTE — Telephone Encounter (Signed)
Lab work consistent with previous lab work. Alkaline phosphatase is slightly elevated. We will monitor. Patient voiced understanding.

## 2016-06-12 IMAGING — CR DG CHEST 2V
2 series · 2 of 2 positions shown · non-contrast
Comparison: 12/12/2013 and 10/05/2009

CLINICAL DATA: Pre operative respiratory exam. Coronary artery
disease.

EXAM:
CHEST  2 VIEW

[w chest pa]
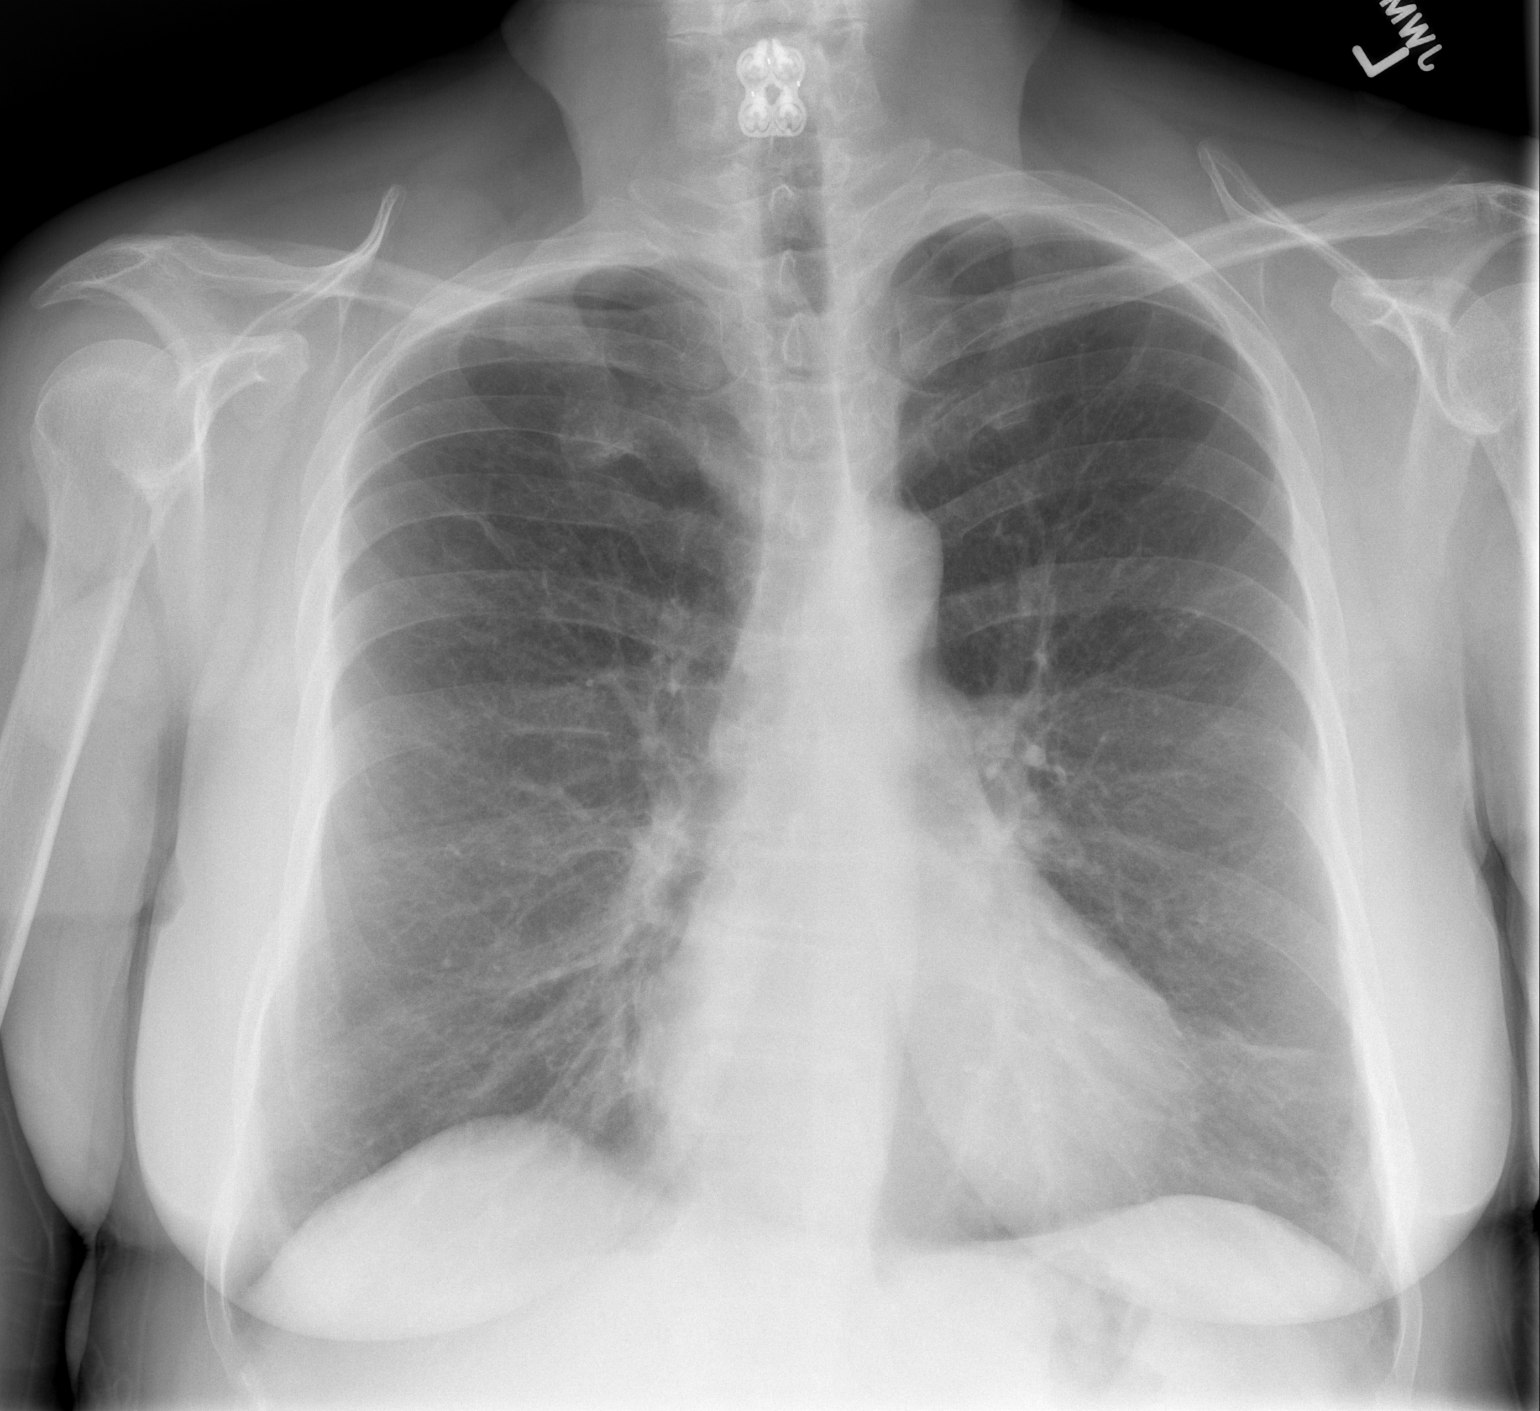

[w chest lat]
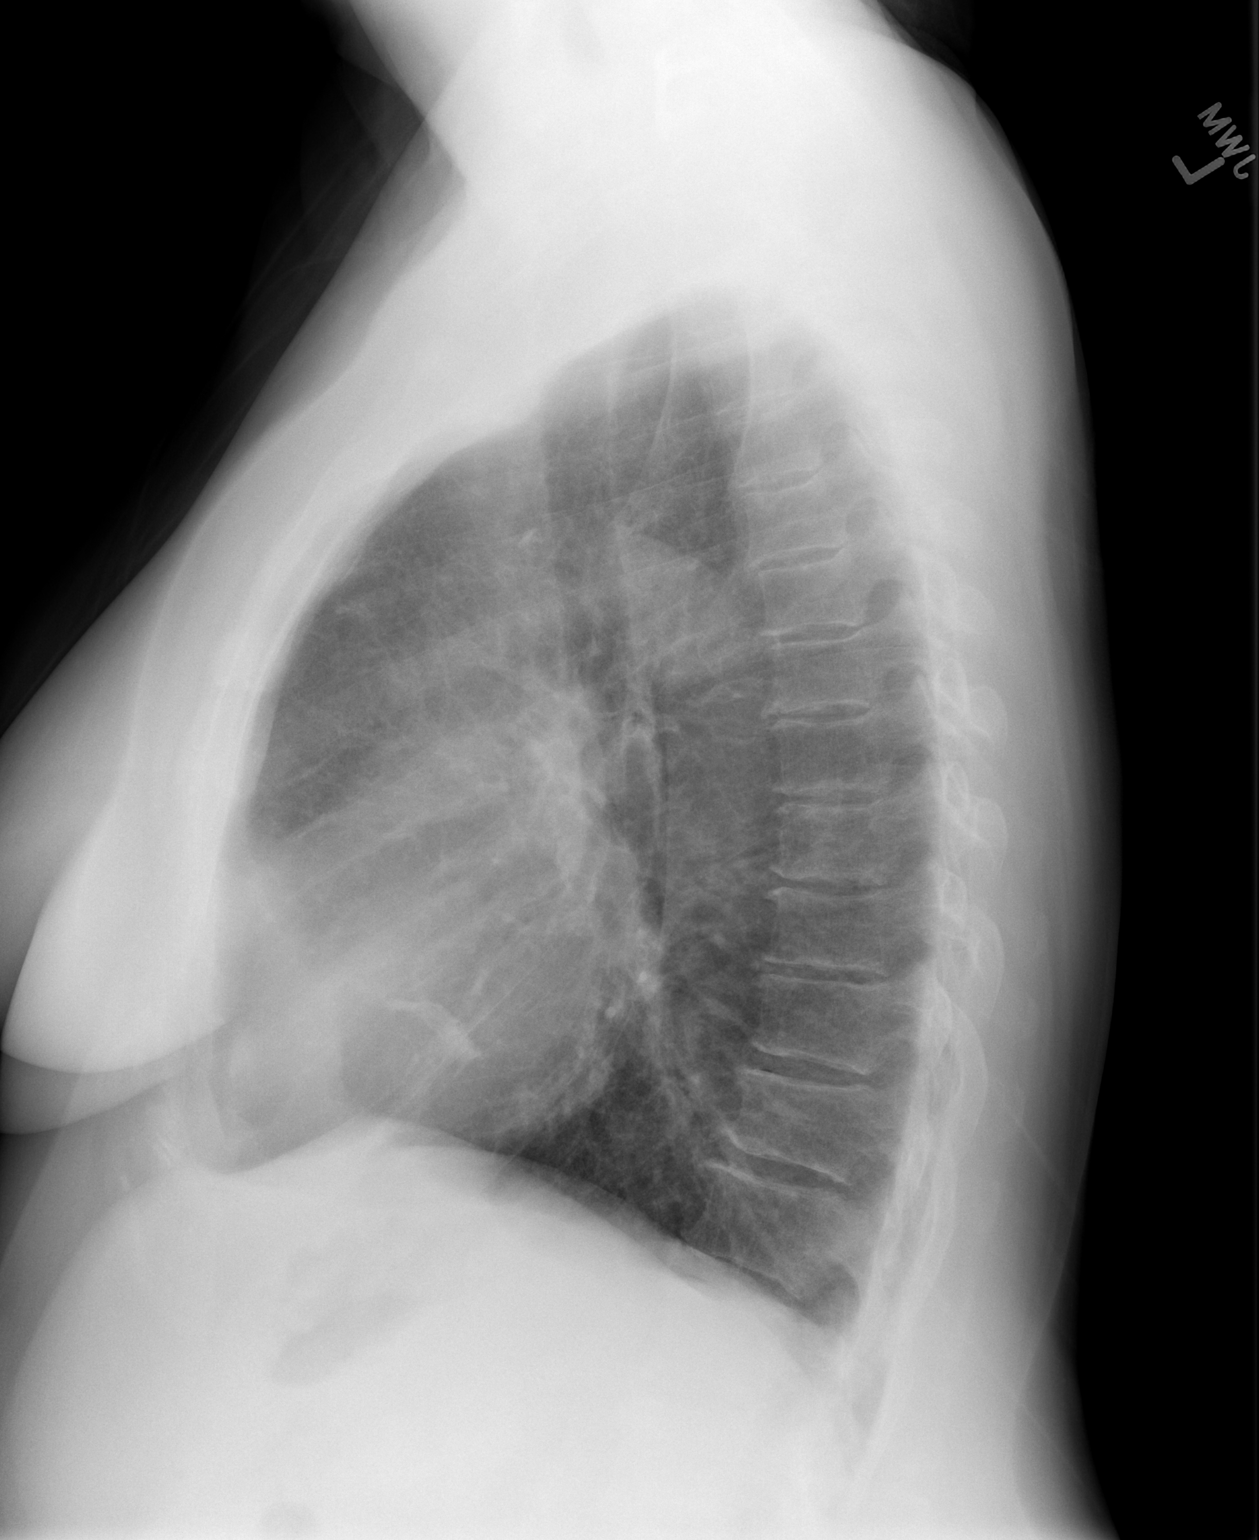

[2 of 2 positions shown; findings below may reference images not displayed]

FINDINGS: Heart size and pulmonary vascularity are normal. Focal area of
scarring in the lingula, unchanged. No infiltrates or effusions. No
acute osseous abnormality. Slight thoracic scoliosis.
IMPRESSION: No active cardiopulmonary disease.

## 2016-06-14 ENCOUNTER — Other Ambulatory Visit: Payer: Self-pay | Admitting: Adult Health

## 2016-06-14 DIAGNOSIS — G35 Multiple sclerosis: Secondary | ICD-10-CM

## 2016-06-14 MED ORDER — METHYLPHENIDATE HCL 20 MG PO TABS: 20.0000 mg | ORAL_TABLET | Freq: Every day | ORAL | 0 refills | Status: DC

## 2016-06-14 NOTE — Addendum Note (Signed)
Addended byHermenia Fiscal on: 06/14/2016 04:40 PM   Modules accepted: Orders

## 2016-06-14 NOTE — Telephone Encounter (Signed)
Pt request refill for methylphenidate (RITALIN) 20 MG tablet . Pt is wanting to pick up RX tomorrow, said she has 1 pill left for tomorrow morning.

## 2016-06-15 NOTE — Telephone Encounter (Signed)
Rn call patient that her ritalin prescription will be left at the front desk. Rn advised pt to have her ID to pick up the prescription. Pt stated she is having a MRI and will be dropping off the desk when she picks up the rx.

## 2016-06-19 ENCOUNTER — Telehealth: Payer: Self-pay | Admitting: Neurology

## 2016-06-19 NOTE — Telephone Encounter (Signed)
I have reviewed the MRI the brain, shows a mild amount of plaque burden, I do not have another scan to compare to. There is an enhancing lesion in the right parietal area consistent with a more recent event on the MRI from 2014, I do not have the report from this most recent scan, I will have the report faxed over.

## 2016-06-20 NOTE — Telephone Encounter (Signed)
Gave request to Hoy Morn in medical records

## 2016-06-21 NOTE — Telephone Encounter (Signed)
Dr Anne Hahn- just received report. Gave to you for review, thank you

## 2016-06-21 NOTE — Telephone Encounter (Signed)
I called the patient. MRI the brain was done showing minimal progression from a study back in 2009. No enhancing lesions are seen on this evaluation. The brain findings are relatively minimal, and relatively stable over time. I discussed this with the patient.

## 2016-06-21 NOTE — Telephone Encounter (Signed)
Checked with Stanton Kidney, she will call again to request records. Have not received yet.

## 2016-07-19 ENCOUNTER — Telehealth: Payer: Self-pay | Admitting: Neurology

## 2016-07-19 DIAGNOSIS — G35 Multiple sclerosis: Secondary | ICD-10-CM

## 2016-07-19 MED ORDER — METHYLPHENIDATE HCL 20 MG PO TABS: 20.0000 mg | ORAL_TABLET | Freq: Every day | ORAL | 0 refills | Status: DC

## 2016-07-19 NOTE — Addendum Note (Signed)
Addended by: Stephanie Acre on: 07/19/2016 09:40 AM   Modules accepted: Orders

## 2016-07-19 NOTE — Telephone Encounter (Signed)
Pt requesting refill on  °  °methylphenidate (RITALIN) 20 MG tablet  ° ° °

## 2016-07-19 NOTE — Telephone Encounter (Signed)
I will refill the Ritalin. 

## 2016-07-20 ENCOUNTER — Other Ambulatory Visit: Payer: Self-pay | Admitting: *Deleted

## 2016-07-20 DIAGNOSIS — G35D Multiple sclerosis, unspecified: Secondary | ICD-10-CM

## 2016-07-20 DIAGNOSIS — G35 Multiple sclerosis: Secondary | ICD-10-CM

## 2016-07-20 MED ORDER — METHYLPHENIDATE HCL 20 MG PO TABS: 20.0000 mg | ORAL_TABLET | Freq: Every day | ORAL | 0 refills | Status: DC

## 2016-07-27 ENCOUNTER — Telehealth: Payer: Self-pay | Admitting: Cardiology

## 2016-07-27 DIAGNOSIS — E785 Hyperlipidemia, unspecified: Secondary | ICD-10-CM

## 2016-07-27 MED ORDER — ESOMEPRAZOLE MAGNESIUM 40 MG PO CPDR: DELAYED_RELEASE_CAPSULE | ORAL | 0 refills | Status: DC

## 2016-07-27 MED ORDER — LOSARTAN POTASSIUM-HCTZ 100-12.5 MG PO TABS: 1.0000 | ORAL_TABLET | Freq: Every day | ORAL | 0 refills | Status: DC

## 2016-07-27 MED ORDER — ATORVASTATIN CALCIUM 40 MG PO TABS: 40.0000 mg | ORAL_TABLET | Freq: Every day | ORAL | 0 refills | Status: DC

## 2016-07-27 MED ORDER — METOPROLOL SUCCINATE ER 50 MG PO TB24: 50.0000 mg | ORAL_TABLET | Freq: Every day | ORAL | 0 refills | Status: DC

## 2016-07-27 NOTE — Telephone Encounter (Signed)
#  30 refill sent to pharmacy as requested to last until appt

## 2016-07-27 NOTE — Telephone Encounter (Signed)
New message    Pt states she will be out of medication Sunday. She made an appt on 4/11 with Dr. Jens Som. She says she was waiting on her recall, but she never got it to schedule her appt. She is asking that RN call her.  *STAT* If patient is at the pharmacy, call can be transferred to refill team.   1. Which medications need to be refilled? (please list name of each medication and dose if known) toprolol XL 50 mg, losartan 100-12.5mg , lipitor 40 mg, nexium 40 mg  2. Which pharmacy/location (including street and city if local pharmacy) is medication to be sent to? [facility] [address] in [place]   3. Do they need a 30 day or 90 day supply? 90 day

## 2016-08-03 ENCOUNTER — Encounter: Payer: Self-pay | Admitting: Cardiology

## 2016-08-08 NOTE — Progress Notes (Signed)
HPI: FU CAD, hypertension, prolonged QT and mitral valve prolapse. Holter monitor in September of 2012 showed sinus rhythm with rare PVC. Echocardiogram in June of 2015 showed normal LV function and mild left atrial enlargement. There was trace mitral regurgitation. Patient had an abdominal CT for abdominal pain in December 2015. 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 Nuclear study January 2016 showed an ejection fraction of 56%, probable mild breast attenuation with possible mild mid anteroseptal ischemia. Cardiac catheterization February 2016 showed 80% LAD and 60-70% RCA. Ejection fraction 55-65%. The patient had PCI of the LAD and RCA; ultrasound March 2016 showed no DVT. Since she was last seen, she denies dyspnea, chest pain, palpitations or syncope.  Current Outpatient Prescriptions  Medication Sig Dispense Refill  . aspirin 81 MG tablet Take 1 tablet (81 mg total) by mouth daily. 30 tablet   . atorvastatin (LIPITOR) 40 MG tablet Take 1 tablet (40 mg total) by mouth daily at 6 PM. 30 tablet 0  . Dimethyl Fumarate (TECFIDERA) 240 MG CPDR Take 1 capsule (240 mg total) by mouth 2 (two) times daily. 180 capsule 3  . DULoxetine (CYMBALTA) 60 MG capsule TAKE 1 CAPSULE (60 MG TOTAL) BY MOUTH 2 (TWO) TIMES DAILY. 180 capsule 1  . ENABLEX 7.5 MG 24 hr tablet Take 1 tablet by mouth daily.    Marland Kitchen esomeprazole (NEXIUM) 40 MG capsule TAKE 1 CAPSULE (40 MG TOTAL) BY MOUTH DAILY AT 12 NOON. 30 capsule 0  . gabapentin (NEURONTIN) 100 MG capsule Take 1 capsule (100 mg total) by mouth 3 (three) times daily. 270 capsule 0  . losartan-hydrochlorothiazide (HYZAAR) 100-12.5 MG tablet Take 1 tablet by mouth daily. 30 tablet 0  . methocarbamol (ROBAXIN) 500 MG tablet Take 1 tablet (500 mg total) by mouth 3 (three) times daily. 30 tablet 1  . methylphenidate (RITALIN) 20 MG tablet Take 1 tablet (20 mg total) by mouth daily. At noon 30 tablet 0  . metoprolol  succinate (TOPROL-XL) 50 MG 24 hr tablet Take 1 tablet (50 mg total) by mouth daily. NEED OV. 30 tablet 0  . modafinil (PROVIGIL) 200 MG tablet Take 1 tablet (200 mg total) by mouth daily. 30 tablet 5  . NASONEX 50 MCG/ACT nasal spray Place 50 sprays into the nose daily.     No current facility-administered medications for this visit.      Past Medical History:  Diagnosis Date  . Anxiety   . Arthritis   . CAD (coronary artery disease)    a. 06/2014 abnl MV;  b. 06/2014 Cath: LM nl, LAD 43m (2.75x28 Promus DES), LCX nl, RCA 60-57m (FFR = 0.80 -> 3.0x24 Promus DES), EF 55-65%.  . Carpal tunnel syndrome   . Cervical dysplasia   . Chronic fatigue   . Depression   . GERD (gastroesophageal reflux disease)   . Hypertension   . Mitral valve prolapse   . Multiple sclerosis (HCC)   . Pneumonia   . Prolonged QT interval   . RLS (restless legs syndrome) 04/28/2015    Past Surgical History:  Procedure Laterality Date  . ABDOMINAL HYSTERECTOMY    . BICEPS TENDON REPAIR    . bladder resuspension    . CARPAL TUNNEL RELEASE    . CERVICAL SPINE SURGERY  10-16-2009  . CORONARY ANGIOPLASTY WITH STENT PLACEMENT    . DILATION AND CURETTAGE OF UTERUS    . FOOT SURGERY Bilateral    For bunions  .  HAMMER TOE SURGERY Left   . LEFT HEART CATHETERIZATION WITH CORONARY ANGIOGRAM N/A 06/29/2014   Procedure: LEFT HEART CATHETERIZATION WITH CORONARY ANGIOGRAM;  Surgeon: Micheline Chapman, MD;  Location: [facility] CATH LAB;  Service: Cardiovascular;  Laterality: N/A;  . ORIF TOE FRACTURE Right   . Prolong QTC     Heart  . rectovaginal fistula repair      Social History   Social History  . Marital status: Divorced    Spouse name: N/A  . Number of children: 2  . Years of education: 13   Occupational History  . Disabled     Disability   Social History Main Topics  . Smoking status: Current Every Day Smoker    Packs/day: 0.50    Years: 35.00    Types: Cigarettes    Last attempt to quit: 06/30/2014  .  Smokeless tobacco: Never Used  . Alcohol use No  . Drug use: No  . Sexual activity: Not on file   Other Topics Concern  . Not on file   Social History Narrative   Patient does not drink caffeine.   Patient is right handed.    Family History  Problem Relation Age of Onset  . Melanoma Mother   . Colon polyps Mother   . Diabetes Mother   . Breast cancer Maternal Grandmother   . Colon cancer Maternal Grandmother   . Brain cancer Maternal Grandfather   . Lung cancer Maternal Grandfather   . Diabetes Maternal Grandfather   . Kidney disease Neg Hx   . Esophageal cancer Neg Hx   . Gallbladder disease Neg Hx     ROS: no fevers or chills, productive cough, hemoptysis, dysphasia, odynophagia, melena, hematochezia, dysuria, hematuria, rash, seizure activity, orthopnea, PND, pedal edema, claudication. Remaining systems are negative.  Physical Exam: Well-developed well-nourished in no acute distress.  Skin is warm and dry.  HEENT is normal.  Neck is supple. No bruits Chest is clear to auscultation with normal expansion.  Cardiovascular exam is regular rate and rhythm.  Abdominal exam nontender or distended. No masses palpated. Extremities show no edema. neuro grossly intact  ECG- Sinus rhythm at a rate of 74. No ST changes. personally reviewed  A/P  1 Coronary artery disease-continue aspirin and statin.  2 hypertension-blood pressure controlled. Continue present medications. History of hypokalemia. Check potassium and renal function.  3 hyperlipidemia-continue statin. Recent alkaline phosphatase elevated. Repeat liver functions, GGT and 5'-nucleotidase.  4 tobacco abuse-patient counseled on discontinuing.  5 prolonged QT interval-no history of syncope.  Olga Millers, MD

## 2016-08-16 ENCOUNTER — Encounter: Payer: Self-pay | Admitting: Cardiology

## 2016-08-16 ENCOUNTER — Ambulatory Visit (INDEPENDENT_AMBULATORY_CARE_PROVIDER_SITE_OTHER): Payer: Medicare Other | Admitting: Cardiology

## 2016-08-16 VITALS — BP 132/87 | HR 74 | Ht 67.0 in | Wt 148.1 lb

## 2016-08-16 DIAGNOSIS — I10 Essential (primary) hypertension: Secondary | ICD-10-CM

## 2016-08-16 DIAGNOSIS — E785 Hyperlipidemia, unspecified: Secondary | ICD-10-CM | POA: Diagnosis not present

## 2016-08-16 DIAGNOSIS — I2510 Atherosclerotic heart disease of native coronary artery without angina pectoris: Secondary | ICD-10-CM

## 2016-08-16 MED ORDER — LOSARTAN POTASSIUM-HCTZ 100-12.5 MG PO TABS: 1.0000 | ORAL_TABLET | Freq: Every day | ORAL | 3 refills | Status: DC

## 2016-08-16 MED ORDER — METOPROLOL SUCCINATE ER 50 MG PO TB24: 50.0000 mg | ORAL_TABLET | Freq: Every day | ORAL | 3 refills | Status: DC

## 2016-08-16 MED ORDER — ESOMEPRAZOLE MAGNESIUM 40 MG PO CPDR: DELAYED_RELEASE_CAPSULE | ORAL | 3 refills | Status: DC

## 2016-08-16 MED ORDER — ATORVASTATIN CALCIUM 40 MG PO TABS: 40.0000 mg | ORAL_TABLET | Freq: Every day | ORAL | 3 refills | Status: DC

## 2016-08-16 NOTE — Patient Instructions (Signed)
Medication Instructions:   Refill sent to the pharmacy electronically.   Labwork:  Your physician recommends that you HAVE LAB WORK TODAY  Follow-Up:  Your physician wants you to follow-up in: 6 MONTHS WITH DR CRENSHAW You will receive a reminder letter in the mail two months in advance. If you don't receive a letter, please call our office to schedule the follow-up appointment.   If you need a refill on your cardiac medications before your next appointment, please call your pharmacy.    

## 2016-08-17 LAB — SPECIMEN STATUS

## 2016-08-18 LAB — NUCLEOTIDASE, 5', BLOOD: 5 NUCLEOTIDASE: 4 IU/L (ref 0–10)

## 2016-08-18 LAB — BASIC METABOLIC PANEL WITH GFR
BUN/Creatinine Ratio: 10 (ref 9–23)
BUN: 7 mg/dL (ref 6–24)
CO2: 23 mmol/L (ref 18–29)
Calcium: 9.3 mg/dL (ref 8.7–10.2)
Chloride: 101 mmol/L (ref 96–106)
Creatinine, Ser: 0.73 mg/dL (ref 0.57–1.00)
GFR calc Af Amer: 108 mL/min/{1.73_m2}
GFR calc non Af Amer: 94 mL/min/{1.73_m2}
Glucose: 92 mg/dL (ref 65–99)
Potassium: 3.9 mmol/L (ref 3.5–5.2)
Sodium: 141 mmol/L (ref 134–144)

## 2016-08-18 LAB — HEPATIC FUNCTION PANEL
ALBUMIN: 4.1 g/dL (ref 3.5–5.5)
ALT: 17 IU/L (ref 0–32)
AST: 19 IU/L (ref 0–40)
Alkaline Phosphatase: 144 IU/L — ABNORMAL HIGH (ref 39–117)
Bilirubin Total: 0.3 mg/dL (ref 0.0–1.2)
Bilirubin, Direct: 0.08 mg/dL (ref 0.00–0.40)
Total Protein: 6.5 g/dL (ref 6.0–8.5)

## 2016-08-18 LAB — LIPID PANEL
Chol/HDL Ratio: 2.5 ratio (ref 0.0–4.4)
Cholesterol, Total: 111 mg/dL (ref 100–199)
HDL: 45 mg/dL
LDL Calculated: 51 mg/dL (ref 0–99)
Triglycerides: 76 mg/dL (ref 0–149)
VLDL Cholesterol Cal: 15 mg/dL (ref 5–40)

## 2016-08-18 LAB — GAMMA GT: GGT: 7 IU/L (ref 0–60)

## 2016-08-22 ENCOUNTER — Encounter: Payer: Self-pay | Admitting: *Deleted

## 2016-08-23 ENCOUNTER — Telehealth: Payer: Self-pay | Admitting: Neurology

## 2016-08-23 DIAGNOSIS — G35 Multiple sclerosis: Secondary | ICD-10-CM

## 2016-08-23 MED ORDER — METHYLPHENIDATE HCL 20 MG PO TABS: 20.0000 mg | ORAL_TABLET | Freq: Every day | ORAL | 0 refills | Status: DC

## 2016-08-23 NOTE — Telephone Encounter (Signed)
Printed/signed rx up front for patient pick up. 

## 2016-08-23 NOTE — Telephone Encounter (Signed)
Patient requesting refill of methylphenidate (RITALIN) 20 MG tablet. ° ° °

## 2016-08-23 NOTE — Addendum Note (Signed)
Addended by: York Spaniel on: 08/23/2016 11:55 AM   Modules accepted: Orders

## 2016-08-23 NOTE — Telephone Encounter (Signed)
Ritalin prescription will be refilled

## 2016-09-26 ENCOUNTER — Telehealth: Payer: Self-pay | Admitting: Neurology

## 2016-09-26 DIAGNOSIS — G35 Multiple sclerosis: Secondary | ICD-10-CM

## 2016-09-26 NOTE — Telephone Encounter (Signed)
Pt calling for refill of methylphenidate (RITALIN) 20 MG tablet °

## 2016-09-27 MED ORDER — METHYLPHENIDATE HCL 20 MG PO TABS: 20.0000 mg | ORAL_TABLET | Freq: Every day | ORAL | 0 refills | Status: DC

## 2016-09-27 NOTE — Addendum Note (Signed)
Addended by: York Spaniel on: 09/27/2016 07:26 AM   Modules accepted: Orders

## 2016-09-27 NOTE — Telephone Encounter (Signed)
Placed printed/signed rx up front for patient pick up.  

## 2016-09-27 NOTE — Telephone Encounter (Signed)
The Ritalin prescription was refilled.

## 2016-10-14 ENCOUNTER — Other Ambulatory Visit: Payer: Self-pay | Admitting: Neurology

## 2016-10-30 ENCOUNTER — Telehealth: Payer: Self-pay | Admitting: Neurology

## 2016-10-30 DIAGNOSIS — G35 Multiple sclerosis: Secondary | ICD-10-CM

## 2016-10-30 MED ORDER — METHYLPHENIDATE HCL 20 MG PO TABS: 20.0000 mg | ORAL_TABLET | Freq: Every day | ORAL | 0 refills | Status: DC

## 2016-10-30 NOTE — Telephone Encounter (Signed)
Placed printed/signed rx Ritalin up front for patient pick up.  

## 2016-10-30 NOTE — Addendum Note (Signed)
Addended by: York Spaniel on: 10/30/2016 01:07 PM   Modules accepted: Orders

## 2016-10-30 NOTE — Telephone Encounter (Signed)
Pt calling for refill of methylphenidate (RITALIN) 20 MG tablet °

## 2016-10-30 NOTE — Telephone Encounter (Signed)
The prescription for Ritalin will be written.

## 2016-12-04 ENCOUNTER — Telehealth: Payer: Self-pay | Admitting: Neurology

## 2016-12-04 DIAGNOSIS — G35 Multiple sclerosis: Secondary | ICD-10-CM

## 2016-12-04 MED ORDER — METHYLPHENIDATE HCL 20 MG PO TABS: 20.0000 mg | ORAL_TABLET | Freq: Every day | ORAL | 0 refills | Status: DC

## 2016-12-04 NOTE — Addendum Note (Signed)
Addended by: York Spaniel on: 12/04/2016 04:39 PM   Modules accepted: Orders

## 2016-12-04 NOTE — Telephone Encounter (Signed)
I will write a prescription for Ritalin.

## 2016-12-04 NOTE — Telephone Encounter (Signed)
Patient called office requesting refill for methylphenidate (RITALIN) 20 MG tablet.  °

## 2016-12-05 NOTE — Telephone Encounter (Signed)
Placed printed/signed rx ritalin up front for patient pick up.

## 2016-12-06 ENCOUNTER — Telehealth: Payer: Self-pay | Admitting: *Deleted

## 2016-12-06 ENCOUNTER — Ambulatory Visit (INDEPENDENT_AMBULATORY_CARE_PROVIDER_SITE_OTHER): Payer: Medicare Other | Admitting: Neurology

## 2016-12-06 ENCOUNTER — Encounter: Payer: Self-pay | Admitting: Neurology

## 2016-12-06 VITALS — BP 138/88 | HR 78 | Ht 67.0 in | Wt 146.5 lb

## 2016-12-06 DIAGNOSIS — Z5181 Encounter for therapeutic drug level monitoring: Secondary | ICD-10-CM | POA: Diagnosis not present

## 2016-12-06 DIAGNOSIS — R269 Unspecified abnormalities of gait and mobility: Secondary | ICD-10-CM

## 2016-12-06 DIAGNOSIS — G35 Multiple sclerosis: Secondary | ICD-10-CM | POA: Diagnosis not present

## 2016-12-06 MED ORDER — MODAFINIL 200 MG PO TABS: 200.0000 mg | ORAL_TABLET | Freq: Every day | ORAL | 3 refills | Status: DC

## 2016-12-06 NOTE — Progress Notes (Signed)
Faxed printed/signed rx modafinil to pt pharmacy. Fax: [phone]. Received confirmation.

## 2016-12-06 NOTE — Progress Notes (Signed)
Reason for visit: Multiple sclerosis  Summer Henderson is an 56 y.o. female  History of present illness:  Summer Henderson is a 56 year old right-handed white female with a history of multiple sclerosis. The patient had been on Tecfidera, but at some point she stopped the medication. The patient claims that she was having excessive flushing on the medication. The patient continues to have ongoing problems with a neurogenic bladder, difficulty with balance, she will stumble on occasion. She reports severe chronic fatigue which is the most disabling aspect of her multiple sclerosis. The patient takes Provigil 200 mg tablets, but she claims that she cannot sleep at night if she takes it earlier in the day. She takes Ritalin in the morning, but the effects of the medication wear off after noon. The patient does have some troubles with memory, she is having difficulty keeping up with her medications. She returns to this office for an evaluation. She has not noted any new vision changes or any new numbness or weakness. The patient has significant issues with heat intolerance.  Past Medical History:  Diagnosis Date  . Anxiety   . Arthritis   . CAD (coronary artery disease)    a. 06/2014 abnl MV;  b. 06/2014 Cath: LM nl, LAD 68m (2.75x28 Promus DES), LCX nl, RCA 60-29m (FFR = 0.80 -> 3.0x24 Promus DES), EF 55-65%.  . Carpal tunnel syndrome   . Cervical dysplasia   . Chronic fatigue   . Depression   . GERD (gastroesophageal reflux disease)   . Hypertension   . Mitral valve prolapse   . Multiple sclerosis (HCC)   . Pneumonia   . Prolonged QT interval   . RLS (restless legs syndrome) 04/28/2015    Past Surgical History:  Procedure Laterality Date  . ABDOMINAL HYSTERECTOMY    . BICEPS TENDON REPAIR    . bladder resuspension    . CARPAL TUNNEL RELEASE    . CERVICAL SPINE SURGERY  10-16-2009  . CORONARY ANGIOPLASTY WITH STENT PLACEMENT    . DILATION AND CURETTAGE OF UTERUS    . FOOT SURGERY Bilateral      For bunions  . HAMMER TOE SURGERY Left   . LEFT HEART CATHETERIZATION WITH CORONARY ANGIOGRAM N/A 06/29/2014   Procedure: LEFT HEART CATHETERIZATION WITH CORONARY ANGIOGRAM;  Surgeon: Micheline Chapman, MD;  [facility];  Service: Cardiovascular;  Laterality: N/A;  . ORIF TOE FRACTURE Right   . Prolong QTC     Heart  . rectovaginal fistula repair      Family History  Problem Relation Age of Onset  . Melanoma Mother   . Colon polyps Mother   . Diabetes Mother   . Breast cancer Maternal Grandmother   . Colon cancer Maternal Grandmother   . Brain cancer Maternal Grandfather   . Lung cancer Maternal Grandfather   . Diabetes Maternal Grandfather   . Kidney disease Neg Hx   . Esophageal cancer Neg Hx   . Gallbladder disease Neg Hx     Social history:  reports that she has been smoking Cigarettes.  She has a 17.50 pack-year smoking history. She has never used smokeless tobacco. She reports that she does not drink alcohol or use drugs.    Allergies  Allergen Reactions  . Demerol [Meperidine] Other (See Comments)    [derm] rash, swelling, itching Other reaction(s): Other (See Comments) [derm] rash, swelling, itching  . Erythromycin   . Morphine And Related   . Sulfa Antibiotics   .  Diflucan [Fluconazole] Other (See Comments)    States she has prolonged QT and cannot take this.  . Other     Other reaction(s): Other (See Comments) Z-pak causes EKG changes, was told by cardiologist not to take this.  . Sulfacetamide Sodium   . Erythromycin Base Rash    [Derm] rash  . Fesoterodine Palpitations  . Tape     Adhesive tape  . Toviaz [Fesoterodine Fumarate Er] Palpitations    Medications:  Prior to Admission medications   Medication Sig Start Date End Date Taking? Authorizing Provider  aspirin 81 MG tablet Take 1 tablet (81 mg total) by mouth daily. 07/24/14  Yes Lewayne Bunting, MD  atorvastatin (LIPITOR) 40 MG tablet Take 1 tablet (40 mg total) by mouth daily at 6  PM. 08/16/16  Yes Crenshaw, Madolyn Frieze, MD  Dimethyl Fumarate (TECFIDERA) 240 MG CPDR Take 1 capsule (240 mg total) by mouth 2 (two) times daily. 02/16/16  Yes York Spaniel, MD  DULoxetine (CYMBALTA) 60 MG capsule TAKE 1 CAPSULE (60 MG TOTAL) BY MOUTH 2 (TWO) TIMES DAILY. 10/17/16  Yes York Spaniel, MD  ENABLEX 7.5 MG 24 hr tablet Take 1 tablet by mouth daily. 06/18/14  Yes [provider]  esomeprazole (NEXIUM) 40 MG capsule TAKE 1 CAPSULE (40 MG TOTAL) BY MOUTH DAILY AT 12 NOON. 08/16/16  Yes Lewayne Bunting, MD  gabapentin (NEURONTIN) 100 MG capsule Take 1 capsule (100 mg total) by mouth 3 (three) times daily. 02/29/16  Yes York Spaniel, MD  losartan-hydrochlorothiazide (HYZAAR) 100-12.5 MG tablet Take 1 tablet by mouth daily. 08/16/16  Yes Lewayne Bunting, MD  methocarbamol (ROBAXIN) 500 MG tablet Take 1 tablet (500 mg total) by mouth 3 (three) times daily. 11/29/15  Yes York Spaniel, MD  methylphenidate (RITALIN) 20 MG tablet Take 1 tablet (20 mg total) by mouth daily. At noon 12/04/16  Yes York Spaniel, MD  metoprolol succinate (TOPROL-XL) 50 MG 24 hr tablet Take 1 tablet (50 mg total) by mouth daily. 08/16/16  Yes Lewayne Bunting, MD  modafinil (PROVIGIL) 200 MG tablet Take 1 tablet (200 mg total) by mouth daily. 05/16/16  Yes York Spaniel, MD  NASONEX 50 MCG/ACT nasal spray Place 50 sprays into the nose daily. 09/16/12  Yes [provider]    ROS:  Out of a complete 14 system review of symptoms, the patient complains only of the following symptoms, and all other reviewed systems are negative.  Fatigue Heat intolerance  Blood pressure 138/88, pulse 78, height 5\' 7"  (1.702 m), weight 146 lb 8 oz (66.5 kg).  Physical Exam  General: The patient is alert and cooperative at the time of the examination.  Skin: No significant peripheral edema is noted.   Neurologic Exam  Mental status: The patient is alert and oriented x 3 at the time of the  examination. The patient has apparent normal recent and remote memory, with an apparently normal attention span and concentration ability.   Cranial nerves: Facial symmetry is present. Speech is normal, no aphasia or dysarthria is noted. Extraocular movements are full. Visual fields are full. Pupils are equal, round, and reactive to light. Discs are flat bilaterally.  Motor: The patient has good strength in all 4 extremities.  Sensory examination: Soft touch sensation is symmetric on the face, arms, and legs.  Coordination: The patient has good finger-nose-finger and heel-to-shin bilaterally.  Gait and station: The patient has a wide-based, unsteady gait. Tandem gait is very  unsteady. Romberg is positive.  Reflexes: Deep tendon reflexes are symmetric.   Assessment/Plan:  1. Multiple sclerosis  2. Chronic fatigue  3. Gait disorder  4. Neurogenic bladder  The patient currently is not on any disease modifying agents. She did not tolerate the Tecfidera well. The patient will be sent for blood work today, we will consider the use of Tysabri or Ocrevus in the future. The patient will follow-up in about 3-4 months.   Marlan Palau MD 12/06/2016 12:21 PM  [facility] [address] [place], [place] [address]  Phone [phone] Fax [phone]

## 2016-12-06 NOTE — Telephone Encounter (Signed)
Called [facility] and scheduled routine pick up, lock box for [facility] lab. Confirmation number: [id number].

## 2016-12-07 ENCOUNTER — Telehealth: Payer: Self-pay | Admitting: *Deleted

## 2016-12-07 LAB — CBC WITH DIFFERENTIAL/PLATELET
BASOS: 0 %
Basophils Absolute: 0 10*3/uL (ref 0.0–0.2)
EOS (ABSOLUTE): 0.1 10*3/uL (ref 0.0–0.4)
EOS: 1 %
HEMATOCRIT: 40.2 % (ref 34.0–46.6)
Hemoglobin: 13.2 g/dL (ref 11.1–15.9)
IMMATURE GRANS (ABS): 0 10*3/uL (ref 0.0–0.1)
IMMATURE GRANULOCYTES: 0 %
LYMPHS: 33 %
Lymphocytes Absolute: 2.7 10*3/uL (ref 0.7–3.1)
MCH: 29 pg (ref 26.6–33.0)
MCHC: 32.8 g/dL (ref 31.5–35.7)
MCV: 88 fL (ref 79–97)
MONOS ABS: 0.6 10*3/uL (ref 0.1–0.9)
Monocytes: 7 %
NEUTROS PCT: 59 %
Neutrophils Absolute: 4.8 10*3/uL (ref 1.4–7.0)
PLATELETS: 419 10*3/uL — AB (ref 150–379)
RBC: 4.55 x10E6/uL (ref 3.77–5.28)
RDW: 15.1 % (ref 12.3–15.4)
WBC: 8.1 10*3/uL (ref 3.4–10.8)

## 2016-12-07 LAB — COMPREHENSIVE METABOLIC PANEL
ALK PHOS: 167 IU/L — AB (ref 39–117)
ALT: 20 IU/L (ref 0–32)
AST: 17 IU/L (ref 0–40)
Albumin/Globulin Ratio: 1.5 (ref 1.2–2.2)
Albumin: 4.3 g/dL (ref 3.5–5.5)
BUN/Creatinine Ratio: 11 (ref 9–23)
BUN: 8 mg/dL (ref 6–24)
Bilirubin Total: 0.4 mg/dL (ref 0.0–1.2)
CO2: 27 mmol/L (ref 20–29)
CREATININE: 0.75 mg/dL (ref 0.57–1.00)
Calcium: 9.7 mg/dL (ref 8.7–10.2)
Chloride: 101 mmol/L (ref 96–106)
GFR calc Af Amer: 105 mL/min/{1.73_m2} (ref >59)
GFR, EST NON AFRICAN AMERICAN: 91 mL/min/{1.73_m2} (ref >59)
GLUCOSE: 73 mg/dL (ref 65–99)
Globulin, Total: 2.8 g/dL (ref 1.5–4.5)
Potassium: 4.5 mmol/L (ref 3.5–5.2)
SODIUM: 144 mmol/L (ref 134–144)
Total Protein: 7.1 g/dL (ref 6.0–8.5)

## 2016-12-07 LAB — HEPATITIS B SURFACE ANTIGEN: HEP B S AG: NEGATIVE

## 2016-12-07 LAB — VARICELLA ZOSTER ANTIBODY, IGG: Varicella zoster IgG: 3659 index (ref >165)

## 2016-12-07 LAB — HEPATITIS C ANTIBODY: Hep C Virus Ab: <0.1 s/co ratio (ref 0.0–0.9)

## 2016-12-07 LAB — HEPATITIS B CORE ANTIBODY, TOTAL: HEP B C TOTAL AB: NEGATIVE

## 2016-12-07 NOTE — Telephone Encounter (Signed)
Submitted PA modafinil to covermymeds. [employer] [place] - PA Case ID: [id number] - Rx #: [id number]. Awaiting response from insurance.

## 2016-12-07 NOTE — Telephone Encounter (Signed)
Received fax notification from [employer] that PA modafinil approved effective until 06/09/2017 under patient's Medicare Part D benefit.   Faxed notice of approval to [facility] pharmacy at [phone]. Received confirmation.

## 2016-12-11 ENCOUNTER — Telehealth: Payer: Self-pay | Admitting: Neurology

## 2016-12-11 LAB — QUANTIFERON IN TUBE
QFT TB AG MINUS NIL VALUE: 0 [IU]/mL
QUANTIFERON MITOGEN VALUE: >10 IU/mL
QUANTIFERON NIL VALUE: 0.02 [IU]/mL
QUANTIFERON TB AG VALUE: 0.02 IU/mL
QUANTIFERON TB GOLD: NEGATIVE

## 2016-12-11 LAB — QUANTIFERON TB GOLD ASSAY (BLOOD)

## 2016-12-11 NOTE — Telephone Encounter (Signed)
I called patient. The JC virus antibody is negative. We will initiate Tysabri.  She is to come in and sign the form.

## 2016-12-20 ENCOUNTER — Telehealth: Payer: Self-pay | Admitting: *Deleted

## 2016-12-20 NOTE — Telephone Encounter (Signed)
Called patient to remind her to come in and sign form to start her on Tysabri. She stated she has not had a chance to come by office yet. She is bringing son to appt tomorrow and she will try and stop by to sign forms. Advised I will put up front for her to sign. She verbalized understanding.

## 2016-12-21 NOTE — Telephone Encounter (Signed)
Patient came and signed form in office today. Gave completed/signed forms to [facility] to process.

## 2017-01-10 ENCOUNTER — Telehealth: Payer: Self-pay | Admitting: Neurology

## 2017-01-10 DIAGNOSIS — G35 Multiple sclerosis: Secondary | ICD-10-CM

## 2017-01-10 MED ORDER — METHYLPHENIDATE HCL 20 MG PO TABS: 20.0000 mg | ORAL_TABLET | Freq: Every day | ORAL | 0 refills | Status: DC

## 2017-01-10 NOTE — Telephone Encounter (Signed)
Patient called office requesting refill for methylphenidate (RITALIN) 20 MG tablet.  °

## 2017-01-10 NOTE — Addendum Note (Signed)
Addended by: York Spaniel on: 01/10/2017 12:01 PM   Modules accepted: Orders

## 2017-01-10 NOTE — Telephone Encounter (Signed)
Placed printed/signed rx up front for patient pick up.  

## 2017-01-10 NOTE — Telephone Encounter (Signed)
The Ritalin prescription will be refilled. 

## 2017-02-09 ENCOUNTER — Telehealth: Payer: Self-pay | Admitting: Neurology

## 2017-02-09 NOTE — Telephone Encounter (Signed)
David/[employer] [phone] called appeal for tysabri has been approved 01/17/17-05/07/18  [id number]

## 2017-02-09 NOTE — Telephone Encounter (Signed)
Noted, gave information to Tina/Mindy with [facility].

## 2017-02-13 ENCOUNTER — Telehealth: Payer: Self-pay | Admitting: Neurology

## 2017-02-13 DIAGNOSIS — G35 Multiple sclerosis: Secondary | ICD-10-CM

## 2017-02-13 MED ORDER — METHYLPHENIDATE HCL 20 MG PO TABS: 20.0000 mg | ORAL_TABLET | Freq: Every day | ORAL | 0 refills | Status: DC

## 2017-02-13 NOTE — Telephone Encounter (Signed)
Patient requesting refill of methylphenidate (RITALIN) 20 MG tablet. ° ° °

## 2017-02-13 NOTE — Addendum Note (Signed)
Addended by: York Spaniel on: 02/13/2017 05:17 PM   Modules accepted: Orders

## 2017-02-13 NOTE — Telephone Encounter (Signed)
The prescription for Ritalin was refilled.

## 2017-02-14 NOTE — Telephone Encounter (Signed)
Placed printed/signed rx Ritalin up front for patient pick up.

## 2017-02-19 ENCOUNTER — Telehealth: Payer: Self-pay | Admitting: *Deleted

## 2017-02-19 NOTE — Telephone Encounter (Signed)
Received fax notification from [employer] that rx Tysabri appeal aproved. Authorization number [id number]. Effective from 01/17/2017-05/07/18.  Gave notification to [facility] for their records.

## 2017-02-21 NOTE — Progress Notes (Signed)
HPI: FU CAD, hypertension, prolonged QT and mitral valve prolapse. Holter monitor in September of 2012 showed sinus rhythm with rare PVC. Echocardiogram in June of 2015 showed normal LV function and mild left atrial enlargement. There was trace mitral regurgitation. Patient had an abdominal CT for abdominal pain in December 2015. 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 Nuclear study January 2016 showed an ejection fraction of 56%, probable mild breast attenuation with possible mild mid anteroseptal ischemia. Cardiac catheterization February 2016 showed 80% LAD and 60-70% RCA. Ejection fraction 55-65%. The patient had PCI of the LAD and RCA; ultrasound March 2016 showed no DVT. Since she was last seen,   Current Outpatient Prescriptions  Medication Sig Dispense Refill  . aspirin 81 MG tablet Take 1 tablet (81 mg total) by mouth daily. 30 tablet   . atorvastatin (LIPITOR) 40 MG tablet Take 1 tablet (40 mg total) by mouth daily at 6 PM. 90 tablet 3  . Dimethyl Fumarate (TECFIDERA) 240 MG CPDR Take 1 capsule (240 mg total) by mouth 2 (two) times daily. 180 capsule 3  . DULoxetine (CYMBALTA) 60 MG capsule TAKE 1 CAPSULE (60 MG TOTAL) BY MOUTH 2 (TWO) TIMES DAILY. 180 capsule 1  . ENABLEX 7.5 MG 24 hr tablet Take 1 tablet by mouth daily.    Marland Kitchen esomeprazole (NEXIUM) 40 MG capsule TAKE 1 CAPSULE (40 MG TOTAL) BY MOUTH DAILY AT 12 NOON. 90 capsule 3  . gabapentin (NEURONTIN) 100 MG capsule Take 1 capsule (100 mg total) by mouth 3 (three) times daily. 270 capsule 0  . losartan-hydrochlorothiazide (HYZAAR) 100-12.5 MG tablet Take 1 tablet by mouth daily. 90 tablet 3  . methocarbamol (ROBAXIN) 500 MG tablet Take 1 tablet (500 mg total) by mouth 3 (three) times daily. 30 tablet 1  . methylphenidate (RITALIN) 20 MG tablet Take 1 tablet (20 mg total) by mouth daily. At noon 30 tablet 0  . metoprolol succinate (TOPROL-XL) 50 MG 24 hr tablet Take 1 tablet  (50 mg total) by mouth daily. 90 tablet 3  . modafinil (PROVIGIL) 200 MG tablet Take 1 tablet (200 mg total) by mouth daily. 90 tablet 3  . NASONEX 50 MCG/ACT nasal spray Place 50 sprays into the nose daily.    . natalizumab (TYSABRI) 300 MG/15ML injection Inject into the vein.     No current facility-administered medications for this visit.      Past Medical History:  Diagnosis Date  . Anxiety   . Arthritis   . CAD (coronary artery disease)    a. 06/2014 abnl MV;  b. 06/2014 Cath: LM nl, LAD 2m (2.75x28 Promus DES), LCX nl, RCA 60-68m (FFR = 0.80 -> 3.0x24 Promus DES), EF 55-65%.  . Carpal tunnel syndrome   . Cervical dysplasia   . Chronic fatigue   . Depression   . GERD (gastroesophageal reflux disease)   . Hypertension   . Mitral valve prolapse   . Multiple sclerosis (HCC)   . Pneumonia   . Prolonged QT interval   . RLS (restless legs syndrome) 04/28/2015    Past Surgical History:  Procedure Laterality Date  . ABDOMINAL HYSTERECTOMY    . BICEPS TENDON REPAIR    . bladder resuspension    . CARPAL TUNNEL RELEASE    . CERVICAL SPINE SURGERY  10-16-2009  . CORONARY ANGIOPLASTY WITH STENT PLACEMENT    . DILATION AND CURETTAGE OF UTERUS    . FOOT SURGERY Bilateral  For bunions  . HAMMER TOE SURGERY Left   . LEFT HEART CATHETERIZATION WITH CORONARY ANGIOGRAM N/A 06/29/2014   Procedure: LEFT HEART CATHETERIZATION WITH CORONARY ANGIOGRAM;  Surgeon: Micheline ChapmanMichael D Cooper, MD;  [facility];  Service: Cardiovascular;  Laterality: N/A;  . ORIF TOE FRACTURE Right   . Prolong QTC     Heart  . rectovaginal fistula repair      Social History   Social History  . Marital status: Divorced    Spouse name: N/A  . Number of children: 2  . Years of education: 13   Occupational History  . Disabled     Disability   Social History Main Topics  . Smoking status: Current Every Day Smoker    Packs/day: 0.50    Years: 35.00    Types: Cigarettes    Last attempt to quit:  06/30/2014  . Smokeless tobacco: Never Used  . Alcohol use No  . Drug use: No  . Sexual activity: Not on file   Other Topics Concern  . Not on file   Social History Narrative   Patient does not drink caffeine.   Patient is right handed.    Family History  Problem Relation Age of Onset  . Melanoma Mother   . Colon polyps Mother   . Diabetes Mother   . Breast cancer Maternal Grandmother   . Colon cancer Maternal Grandmother   . Brain cancer Maternal Grandfather   . Lung cancer Maternal Grandfather   . Diabetes Maternal Grandfather   . Kidney disease Neg Hx   . Esophageal cancer Neg Hx   . Gallbladder disease Neg Hx     ROS: no fevers or chills, productive cough, hemoptysis, dysphasia, odynophagia, melena, hematochezia, dysuria, hematuria, rash, seizure activity, orthopnea, PND, pedal edema, claudication. Remaining systems are negative.  Physical Exam: Well-developed well-nourished in no acute distress.  Skin is warm and dry.  HEENT is normal.  Neck is supple.  Chest is clear to auscultation with normal expansion.  Cardiovascular exam is regular rate and rhythm.  Abdominal exam nontender or distended. No masses palpated. Extremities show no edema. neuro grossly intact  ECG- personally reviewed  A/P  1  Olga MillersBrian Jaquez Farrington, MD

## 2017-02-22 ENCOUNTER — Ambulatory Visit (INDEPENDENT_AMBULATORY_CARE_PROVIDER_SITE_OTHER): Payer: Medicare Other | Admitting: Cardiology

## 2017-02-22 ENCOUNTER — Encounter: Payer: Self-pay | Admitting: Cardiology

## 2017-02-22 VITALS — Ht 67.0 in

## 2017-02-22 DIAGNOSIS — I2510 Atherosclerotic heart disease of native coronary artery without angina pectoris: Secondary | ICD-10-CM

## 2017-03-05 ENCOUNTER — Telehealth: Payer: Self-pay | Admitting: *Deleted

## 2017-03-05 NOTE — Telephone Encounter (Signed)
I believe that an $42 co-pay is not unreasonable for medication that costs almost $8000 a month.  If the patient does not wish to pay the co-pay, she will not get treated.

## 2017-03-05 NOTE — Telephone Encounter (Signed)
Called and spoke with patient. Advised I received message from [facility] that she has a 8 dollar copay for rx Tysabri. Needs to pay this prior to pharmacy shipping to our office.   Pt stated that she has never had a copay and gets drug assistance for all medications. When she spoke with someone from the pharmacy a couple weeks ago, they told her they did not have [place] medicaid on file for her and was going to look into this and call her back. She states they never did. She does not want to pay copay. Advised I will look more into this and call back if I need anything further. She verbalized understanding.

## 2017-03-05 NOTE — Telephone Encounter (Signed)
Called and LVM for pt to call back.  Was going to advise her to f/u with [facility] at [phone] about copay. We do not have control over the 8 dollar copay. This is up to her insurance company. Also recommend she contact case worker with Medicaid to see if they can help her as well.   We recommend treatment with Tysabri and in order to have med shipped to our office to start treatment and avoid progression of disease, she needs to pay the copay.

## 2017-03-05 NOTE — Telephone Encounter (Addendum)
Pt called office back. I relayed message below. Advised it is important she start treatment ASAP to prevent disease progression. She proceeded to laugh and replied "you don't think I know that?".  She stated she did not tell the pharmacy she refused to pay copay. She stated that she told them she never had to pay a copay and did not understand why she has a copay now. They told her they did not have [place] medicaid on file and they would look into it and call her back. She never got return call. I recommended she contact the pharmacy to discuss and also her medicaid case Production [employer], [employer]tele[employer]film. She continued to talk over me.  She had several disrespectful comments. She states she plans on waiting until her disability check comes on 03/10/17 and she can pay copay if she needs to. When I asked if that is what her plan was and I could relay this to [facility], she did not give me a direct answer. I again advised her to f/u with pharamcy and Medicaid case manager if she needs further assistance with copay. She stated she should have told the specialty pharmacy that she was "butt broke". I advised that they may have misunderstood her when she was speaking with them and this is why it is important to call them and clear up any confusion so we are all on the same page. I continued to try and help patient, however, she continued to talk over me. I asked her to let me try and explain and she she ended call in middle of conversation.

## 2017-03-08 ENCOUNTER — Encounter: Payer: Self-pay | Admitting: Neurology

## 2017-03-08 ENCOUNTER — Ambulatory Visit (INDEPENDENT_AMBULATORY_CARE_PROVIDER_SITE_OTHER): Payer: Medicare Other | Admitting: Neurology

## 2017-03-08 VITALS — BP 117/76 | HR 94 | Ht 62.0 in | Wt 142.2 lb

## 2017-03-08 DIAGNOSIS — Z5181 Encounter for therapeutic drug level monitoring: Secondary | ICD-10-CM

## 2017-03-08 DIAGNOSIS — G35 Multiple sclerosis: Secondary | ICD-10-CM | POA: Diagnosis not present

## 2017-03-08 DIAGNOSIS — R269 Unspecified abnormalities of gait and mobility: Secondary | ICD-10-CM | POA: Diagnosis not present

## 2017-03-08 MED ORDER — METHYLPHENIDATE HCL 20 MG PO TABS: 20.0000 mg | ORAL_TABLET | Freq: Every day | ORAL | 0 refills | Status: DC

## 2017-03-08 NOTE — Progress Notes (Signed)
Reason for visit: Multiple sclerosis  Summer Henderson is an 56 y.o. female  History of present illness:  Summer Henderson is a 56 year old right-handed white female with a history of multiple sclerosis.  The patient has chronic fatigue and gait instability associated with this.  She did not tolerate Tecfidera previously, she is being switched to Tysabri but she has not yet received her first dose.  The patient apparently has suffered a delay getting the medication through her specialty pharmacy, they are trying to verify her Medicaid eligibility, but it has been 2 weeks and she has not gotten verification yet.  The patient has reported some troubles with worsening balance since last seen.  She denies any changes in bowel or bladder control, no new numbness or weakness of the extremities, and no visual changes.  The patient has not sustained any falls.  She returns for an evaluation.  Past Medical History:  Diagnosis Date  . Anxiety   . Arthritis   . CAD (coronary artery disease)    a. 06/2014 abnl MV;  b. 06/2014 Cath: LM nl, LAD 8613m (2.75x28 Promus DES), LCX nl, RCA 60-6675m (FFR = 0.80 -> 3.0x24 Promus DES), EF 55-65%.  . Carpal tunnel syndrome   . Cervical dysplasia   . Chronic fatigue   . Depression   . GERD (gastroesophageal reflux disease)   . Hypertension   . Mitral valve prolapse   . Multiple sclerosis (HCC)   . Pneumonia   . Prolonged QT interval   . RLS (restless legs syndrome) 04/28/2015    Past Surgical History:  Procedure Laterality Date  . ABDOMINAL HYSTERECTOMY    . BICEPS TENDON REPAIR    . bladder resuspension    . CARPAL TUNNEL RELEASE    . CERVICAL SPINE SURGERY  10-16-2009  . CORONARY ANGIOPLASTY WITH STENT PLACEMENT    . DILATION AND CURETTAGE OF UTERUS    . FOOT SURGERY Bilateral    For bunions  . HAMMER TOE SURGERY Left   . LEFT HEART CATHETERIZATION WITH CORONARY ANGIOGRAM N/A 06/29/2014   Procedure: LEFT HEART CATHETERIZATION WITH CORONARY ANGIOGRAM;  Surgeon:  Micheline ChapmanMichael D Cooper, MD;  [facility];  Service: Cardiovascular;  Laterality: N/A;  . ORIF TOE FRACTURE Right   . Prolong QTC     Heart  . rectovaginal fistula repair      Family History  Problem Relation Age of Onset  . Melanoma Mother   . Colon polyps Mother   . Diabetes Mother   . Breast cancer Maternal Grandmother   . Colon cancer Maternal Grandmother   . Brain cancer Maternal Grandfather   . Lung cancer Maternal Grandfather   . Diabetes Maternal Grandfather   . Kidney disease Neg Hx   . Esophageal cancer Neg Hx   . Gallbladder disease Neg Hx     Social history:  reports that she has been smoking Cigarettes.  She has a 17.50 pack-year smoking history. She has never used smokeless tobacco. She reports that she does not drink alcohol or use drugs.    Allergies  Allergen Reactions  . Demerol [Meperidine] Other (See Comments)    [derm] rash, swelling, itching Other reaction(s): Other (See Comments) [derm] rash, swelling, itching  . Erythromycin   . Morphine And Related   . Sulfa Antibiotics   . Diflucan [Fluconazole] Other (See Comments)    States she has prolonged QT and cannot take this.  . Other     Other reaction(s): Other (See  Comments) Z-pak causes EKG changes, was told by cardiologist not to take this.  . Sulfacetamide Sodium   . Erythromycin Base Rash    [Derm] rash  . Fesoterodine Palpitations  . Tape     Adhesive tape  . Toviaz [Fesoterodine Fumarate Er] Palpitations    Medications:  Prior to Admission medications   Medication Sig Start Date End Date Taking? Authorizing Provider  aspirin 81 MG tablet Take 1 tablet (81 mg total) by mouth daily. 07/24/14  Yes Lewayne Bunting, MD  atorvastatin (LIPITOR) 40 MG tablet Take 1 tablet (40 mg total) by mouth daily at 6 PM. 08/16/16  Yes Crenshaw, Madolyn Frieze, MD  DULoxetine (CYMBALTA) 60 MG capsule TAKE 1 CAPSULE (60 MG TOTAL) BY MOUTH 2 (TWO) TIMES DAILY. 10/17/16  Yes York Spaniel, MD  ENABLEX 7.5 MG 24  hr tablet Take 1 tablet by mouth daily. 06/18/14  Yes [provider]  esomeprazole (NEXIUM) 40 MG capsule TAKE 1 CAPSULE (40 MG TOTAL) BY MOUTH DAILY AT 12 NOON. 08/16/16  Yes Lewayne Bunting, MD  gabapentin (NEURONTIN) 100 MG capsule Take 1 capsule (100 mg total) by mouth 3 (three) times daily. 02/29/16  Yes York Spaniel, MD  losartan-hydrochlorothiazide (HYZAAR) 100-12.5 MG tablet Take 1 tablet by mouth daily. 08/16/16  Yes Lewayne Bunting, MD  methocarbamol (ROBAXIN) 500 MG tablet Take 1 tablet (500 mg total) by mouth 3 (three) times daily. 11/29/15  Yes York Spaniel, MD  methylphenidate (RITALIN) 20 MG tablet Take 1 tablet (20 mg total) by mouth daily. At noon 02/13/17  Yes York Spaniel, MD  metoprolol succinate (TOPROL-XL) 50 MG 24 hr tablet Take 1 tablet (50 mg total) by mouth daily. 08/16/16  Yes Lewayne Bunting, MD  modafinil (PROVIGIL) 200 MG tablet Take 1 tablet (200 mg total) by mouth daily. 12/06/16  Yes York Spaniel, MD  NASONEX 50 MCG/ACT nasal spray Place 50 sprays into the nose daily. 09/16/12  Yes [provider]  natalizumab (TYSABRI) 300 MG/15ML injection Inject into the vein.   Yes [provider]    ROS:  Out of a complete 14 system review of symptoms, the patient complains only of the following symptoms, and all other reviewed systems are negative.  Fatigue Gait disturbance  Blood pressure 117/76, pulse 94, height 5\' 2"  (1.575 m), weight 142 lb 4 oz (64.5 kg).  Physical Exam  General: The patient is alert and cooperative at the time of the examination.  Skin: No significant peripheral edema is noted.   Neurologic Exam  Mental status: The patient is alert and oriented x 3 at the time of the examination. The patient has apparent normal recent and remote memory, with an apparently normal attention span and concentration ability.   Cranial nerves: Facial symmetry is present. Speech is normal, no aphasia or dysarthria is  noted. Extraocular movements are full. Visual fields are full.  Pupils are equal, round, and reactive to light.  Discs are flat bilaterally.  Motor: The patient has good strength in all 4 extremities.  Sensory examination: Soft touch sensation is symmetric on the face, arms, and legs.  Coordination: The patient has good finger-nose-finger and heel-to-shin bilaterally.  Gait and station: The patient has a normal gait. Tandem gait is unsteady.  Romberg is positive, patient goes backwards.  Reflexes: Deep tendon reflexes are symmetric.   Assessment/Plan:  1.  Multiple sclerosis  2.  Chronic fatigue  3.  Gait disturbance  Hopefully the patient will  be starting her Tysabri in the near future.  The patient is to call her Medicaid representative to expedite the approval of the medication through her specialty pharmacy.  The patient otherwise will follow-up in 6 months.  Marlan Palau MD 03/08/2017 11:26 AM  [facility] [address] [place], [place] [address]  Phone [phone] Fax [phone]

## 2017-03-09 NOTE — Telephone Encounter (Signed)
Pt called said she spoke with her specialty pharmacy and the drug will be delivered on Tuesday., she said she is assuming. She is wanting to know what is the procedure once the drug is delivered. I told her intrafusion would call to schedule and appt, she then asked to be transferred to intrafusion.   FYI

## 2017-03-09 NOTE — Telephone Encounter (Signed)
Noted  

## 2017-03-26 ENCOUNTER — Telehealth: Payer: Self-pay | Admitting: Neurology

## 2017-03-26 NOTE — Telephone Encounter (Signed)
Patient calling to discuss infusion of Tysabri she got a week ago. After infusion she was more fatigued, bad headache  And her balance was worse Please call and discuss.

## 2017-03-26 NOTE — Telephone Encounter (Signed)
I called the patient.  The patient received her first injection of Tysabri, she had some increase in fatigue and headache the next day, this has resolved.  Hopefully, the patient will become acclimated to the drug and this will be less of a problem in the future.  No change in drug therapy for now.

## 2017-04-18 ENCOUNTER — Telehealth: Payer: Self-pay | Admitting: Neurology

## 2017-04-18 DIAGNOSIS — G35 Multiple sclerosis: Secondary | ICD-10-CM

## 2017-04-18 MED ORDER — METHYLPHENIDATE HCL 20 MG PO TABS: 20.0000 mg | ORAL_TABLET | Freq: Every day | ORAL | 0 refills | Status: DC

## 2017-04-18 NOTE — Telephone Encounter (Signed)
Placed printed/signed rx up front for patient pick up.  

## 2017-04-18 NOTE — Telephone Encounter (Signed)
Pt calling for a refill of methylphenidate (RITALIN) 20 MG tablet

## 2017-04-18 NOTE — Telephone Encounter (Signed)
The Ritalin prescription will be given.

## 2017-04-18 NOTE — Addendum Note (Signed)
Addended by: York Spaniel on: 04/18/2017 12:31 PM   Modules accepted: Orders

## 2017-05-16 ENCOUNTER — Telehealth: Payer: Self-pay | Admitting: Neurology

## 2017-05-16 DIAGNOSIS — G35 Multiple sclerosis: Secondary | ICD-10-CM

## 2017-05-16 MED ORDER — METHYLPHENIDATE HCL 20 MG PO TABS: 20.0000 mg | ORAL_TABLET | Freq: Every day | ORAL | 0 refills | Status: DC

## 2017-05-16 NOTE — Telephone Encounter (Signed)
Pt request refill for methylphenidate (RITALIN) 20 MG tablet. Pt has infusion appt tomorrow at 11:00, she would like to pick up then

## 2017-05-16 NOTE — Telephone Encounter (Signed)
The Adderall prescription will be written.

## 2017-05-17 NOTE — Telephone Encounter (Signed)
Placed printed/signed rx up front for patient pick up.  

## 2017-05-23 NOTE — Progress Notes (Signed)
HPI: FU CAD, hypertension, prolonged QT and mitral valve prolapse. Holter monitor in September of 2012 showed sinus rhythm with rare PVC. Echocardiogram in June of 2015 showed normal LV function and mild left atrial enlargement. There was trace mitral regurgitation. Patient had an abdominal CT for abdominal pain in December 2015. 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 Nuclear study January 2016 showed an ejection fraction of 56%, probable mild breast attenuation with possible mild mid anteroseptal ischemia. Cardiac catheterization February 2016 showed 80% LAD and 60-70% RCA. Ejection fraction 55-65%. The patient had PCI of the LAD and RCA; ultrasound March 2016 showed no DVT. Since she was last seen,  patient denies dyspnea, chest pain, palpitations or syncope.  Current Outpatient Medications  Medication Sig Dispense Refill  . aspirin 81 MG tablet Take 1 tablet (81 mg total) by mouth daily. 30 tablet   . atorvastatin (LIPITOR) 40 MG tablet Take 1 tablet (40 mg total) by mouth daily at 6 PM. 90 tablet 3  . DULoxetine (CYMBALTA) 60 MG capsule TAKE 1 CAPSULE (60 MG TOTAL) BY MOUTH 2 (TWO) TIMES DAILY. 180 capsule 1  . ENABLEX 7.5 MG 24 hr tablet Take 1 tablet by mouth daily.    Marland Kitchen esomeprazole (NEXIUM) 40 MG capsule TAKE 1 CAPSULE (40 MG TOTAL) BY MOUTH DAILY AT 12 NOON. 90 capsule 3  . gabapentin (NEURONTIN) 100 MG capsule Take 1 capsule (100 mg total) by mouth 3 (three) times daily. 270 capsule 0  . losartan-hydrochlorothiazide (HYZAAR) 100-12.5 MG tablet Take 1 tablet by mouth daily. 90 tablet 3  . methocarbamol (ROBAXIN) 500 MG tablet Take 1 tablet (500 mg total) by mouth 3 (three) times daily. 30 tablet 1  . methylphenidate (RITALIN) 20 MG tablet Take 1 tablet (20 mg total) by mouth daily. At noon 30 tablet 0  . metoprolol succinate (TOPROL-XL) 50 MG 24 hr tablet Take 1 tablet (50 mg total) by mouth daily. 90 tablet 3  . modafinil  (PROVIGIL) 200 MG tablet Take 1 tablet (200 mg total) by mouth daily. 90 tablet 3  . NASONEX 50 MCG/ACT nasal spray Place 50 sprays into the nose daily.    . natalizumab (TYSABRI) 300 MG/15ML injection Inject into the vein.     No current facility-administered medications for this visit.      Past Medical History:  Diagnosis Date  . Anxiety   . Arthritis   . CAD (coronary artery disease)    a. 06/2014 abnl MV;  b. 06/2014 Cath: LM nl, LAD 37m (2.75x28 Promus DES), LCX nl, RCA 60-24m (FFR = 0.80 -> 3.0x24 Promus DES), EF 55-65%.  . Carpal tunnel syndrome   . Cervical dysplasia   . Chronic fatigue   . Depression   . GERD (gastroesophageal reflux disease)   . Hypertension   . Mitral valve prolapse   . Multiple sclerosis (HCC)   . Pneumonia   . Prolonged QT interval   . RLS (restless legs syndrome) 04/28/2015    Past Surgical History:  Procedure Laterality Date  . ABDOMINAL HYSTERECTOMY    . BICEPS TENDON REPAIR    . bladder resuspension    . CARPAL TUNNEL RELEASE    . CERVICAL SPINE SURGERY  10-16-2009  . CORONARY ANGIOPLASTY WITH STENT PLACEMENT    . DILATION AND CURETTAGE OF UTERUS    . FOOT SURGERY Bilateral    For bunions  . HAMMER TOE SURGERY Left   . LEFT HEART CATHETERIZATION  WITH CORONARY ANGIOGRAM N/A 06/29/2014   Procedure: LEFT HEART CATHETERIZATION WITH CORONARY ANGIOGRAM;  Surgeon: Micheline Chapman, MD;  Location: [facility] CATH LAB;  Service: Cardiovascular;  Laterality: N/A;  . ORIF TOE FRACTURE Right   . Prolong QTC     Heart  . rectovaginal fistula repair      Social History   Socioeconomic History  . Marital status: Divorced    Spouse name: Not on file  . Number of children: 2  . Years of education: 68  . Highest education level: Not on file  Social Needs  . Financial resource strain: Not on file  . Food insecurity - worry: Not on file  . Food insecurity - inability: Not on file  . Transportation needs - medical: Not on file  . Transportation needs -  non-medical: Not on file  Occupational History  . Occupation: Disabled    Comment: Disability  Tobacco Use  . Smoking status: Current Every Day Smoker    Packs/day: 0.50    Years: 35.00    Pack years: 17.50    Types: Cigarettes    Last attempt to quit: 06/30/2014    Years since quitting: 2.9  . Smokeless tobacco: Never Used  Substance and Sexual Activity  . Alcohol use: No    Alcohol/week: 0.0 oz  . Drug use: No  . Sexual activity: Not on file  Other Topics Concern  . Not on file  Social History Narrative   Patient does not drink caffeine.   Patient is right handed.    Family History  Problem Relation Age of Onset  . Melanoma Mother   . Colon polyps Mother   . Diabetes Mother   . Breast cancer Maternal Grandmother   . Colon cancer Maternal Grandmother   . Brain cancer Maternal Grandfather   . Lung cancer Maternal Grandfather   . Diabetes Maternal Grandfather   . Kidney disease Neg Hx   . Esophageal cancer Neg Hx   . Gallbladder disease Neg Hx     ROS: Fatigue but no fevers or chills, productive cough, hemoptysis, dysphasia, odynophagia, melena, hematochezia, dysuria, hematuria, rash, seizure activity, orthopnea, PND, pedal edema, claudication. Remaining systems are negative.  Physical Exam: Well-developed well-nourished in no acute distress.  Skin is warm and dry.  HEENT is normal.  Neck is supple.  Chest is clear to auscultation with normal expansion.  Cardiovascular exam is regular rate and rhythm.  Abdominal exam nontender or distended. No masses palpated. Extremities show no edema. neuro grossly intact  ECG-sinus rhythm at a rate of 62.  No ST changes.  Personally reviewed  A/P  1 coronary artery disease-patient denies chest pain.  Continue medical therapy with aspirin and statin.  2 hypertension-blood pressure is controlled.  Continue present medications.  Check potassium and renal function.  3 hyperlipidemia-continue statin.  Check lipids and  liver.  4 tobacco abuse-patient counseled on discontinuing.  5 history of prolonged QT interval-no history of syncope.  Olga Millers, MD

## 2017-05-30 ENCOUNTER — Encounter: Payer: Self-pay | Admitting: Cardiology

## 2017-05-30 ENCOUNTER — Ambulatory Visit (INDEPENDENT_AMBULATORY_CARE_PROVIDER_SITE_OTHER): Payer: Medicare Other | Admitting: Cardiology

## 2017-05-30 VITALS — BP 119/80 | HR 69 | Ht 61.0 in | Wt 149.4 lb

## 2017-05-30 DIAGNOSIS — E785 Hyperlipidemia, unspecified: Secondary | ICD-10-CM

## 2017-05-30 DIAGNOSIS — I10 Essential (primary) hypertension: Secondary | ICD-10-CM

## 2017-05-30 DIAGNOSIS — I2510 Atherosclerotic heart disease of native coronary artery without angina pectoris: Secondary | ICD-10-CM

## 2017-05-30 DIAGNOSIS — Z720 Tobacco use: Secondary | ICD-10-CM | POA: Diagnosis not present

## 2017-05-30 MED ORDER — LOSARTAN POTASSIUM-HCTZ 100-12.5 MG PO TABS: 1.0000 | ORAL_TABLET | Freq: Every day | ORAL | 3 refills | Status: DC

## 2017-05-30 MED ORDER — METOPROLOL SUCCINATE ER 50 MG PO TB24: 50.0000 mg | ORAL_TABLET | Freq: Every day | ORAL | 3 refills | Status: DC

## 2017-05-30 MED ORDER — ATORVASTATIN CALCIUM 40 MG PO TABS
40.0000 mg | ORAL_TABLET | Freq: Every day | ORAL | 3 refills | Status: DC
Start: 2017-05-30 — End: 2018-06-19

## 2017-05-30 NOTE — Patient Instructions (Signed)
Your physician wants you to follow-up in: ONE YEAR WITH DR CRENSHAW You will receive a reminder letter in the mail two months in advance. If you don't receive a letter, please call our office to schedule the follow-up appointment.   If you need a refill on your cardiac medications before your next appointment, please call your pharmacy.  

## 2017-05-31 ENCOUNTER — Encounter: Payer: Self-pay | Admitting: *Deleted

## 2017-05-31 LAB — COMPREHENSIVE METABOLIC PANEL
A/G RATIO: 1.8 (ref 1.2–2.2)
ALT: 17 IU/L (ref 0–32)
AST: 17 IU/L (ref 0–40)
Albumin: 4.4 g/dL (ref 3.5–5.5)
Alkaline Phosphatase: 138 IU/L — ABNORMAL HIGH (ref 39–117)
BILIRUBIN TOTAL: 0.4 mg/dL (ref 0.0–1.2)
BUN/Creatinine Ratio: 6 — ABNORMAL LOW (ref 9–23)
BUN: 4 mg/dL — AB (ref 6–24)
CHLORIDE: 97 mmol/L (ref 96–106)
CO2: 24 mmol/L (ref 20–29)
Calcium: 9.2 mg/dL (ref 8.7–10.2)
Creatinine, Ser: 0.72 mg/dL (ref 0.57–1.00)
GFR calc Af Amer: 110 mL/min/{1.73_m2} (ref >59)
GFR calc non Af Amer: 95 mL/min/{1.73_m2} (ref >59)
GLUCOSE: 72 mg/dL (ref 65–99)
Globulin, Total: 2.5 g/dL (ref 1.5–4.5)
POTASSIUM: 4 mmol/L (ref 3.5–5.2)
Sodium: 138 mmol/L (ref 134–144)
Total Protein: 6.9 g/dL (ref 6.0–8.5)

## 2017-05-31 LAB — LIPID PANEL
CHOL/HDL RATIO: 2.5 ratio (ref 0.0–4.4)
Cholesterol, Total: 126 mg/dL (ref 100–199)
HDL: 51 mg/dL (ref >39)
LDL CALC: 53 mg/dL (ref 0–99)
TRIGLYCERIDES: 109 mg/dL (ref 0–149)
VLDL Cholesterol Cal: 22 mg/dL (ref 5–40)

## 2017-07-05 ENCOUNTER — Telehealth: Payer: Self-pay | Admitting: Cardiology

## 2017-07-05 DIAGNOSIS — R601 Generalized edema: Secondary | ICD-10-CM

## 2017-07-05 NOTE — Telephone Encounter (Signed)
Pt c/o swelling: STAT is pt has developed SOB within 24 hours  1) How much weight have you gained and in what time span? Unknown   2) If swelling, where is the swelling located? ankles  Are you currently taking a fluid pill? no 3) Are you currently SOB? no  4) Do you have a log of your daily weights (if so, list)? 149  5) Have you gained 3 pounds in a day or 5 pounds in a week? Unknown   Have you traveled recently? no

## 2017-07-05 NOTE — Telephone Encounter (Signed)
Returned call to patient of Dr. Jens Som who reports ankle swelling since 5am this morning when she woke up. Denies weight gain of 3lbs in 24 hours or 5lbs in 1 week. She denies shortness of breath. Patient states she eats salt even though she knows she is not supposed to. She states she has not had any swelling in a very long time. She had previously been on lasix but threw this out a few month d/t expiration. Explained that since she does not have active Rx for diuretic I would need to get recommendations from MD on how to proceed with addressing her concerns.   Routed to MD/RN

## 2017-07-06 NOTE — Telephone Encounter (Signed)
Lasix 20 mg daily as needed for edema or weight gain of 2-3 lbs bmet 2 weeks Summer Henderson

## 2017-07-09 MED ORDER — FUROSEMIDE 20 MG PO TABS: ORAL_TABLET | ORAL | 6 refills | Status: DC

## 2017-07-09 NOTE — Telephone Encounter (Signed)
Left detailed message for the patient. New script sent to the pharmacy and Lab orders mailed to the pt.

## 2017-07-31 ENCOUNTER — Telehealth: Payer: Self-pay | Admitting: *Deleted

## 2017-07-31 ENCOUNTER — Telehealth: Payer: Self-pay | Admitting: Neurology

## 2017-07-31 DIAGNOSIS — G35 Multiple sclerosis: Secondary | ICD-10-CM

## 2017-07-31 MED ORDER — METHYLPHENIDATE HCL 20 MG PO TABS: 20.0000 mg | ORAL_TABLET | Freq: Every day | ORAL | 0 refills | Status: DC

## 2017-07-31 MED ORDER — MODAFINIL 200 MG PO TABS: 200.0000 mg | ORAL_TABLET | Freq: Every day | ORAL | 3 refills | Status: DC

## 2017-07-31 NOTE — Telephone Encounter (Signed)
A prescription was sent in for the Provigil and for the Ritalin.

## 2017-07-31 NOTE — Telephone Encounter (Signed)
Initiated PA modafinil on covermymeds. Key: [id number]. In process of completing.

## 2017-07-31 NOTE — Telephone Encounter (Signed)
Submitted PA. Waiting on determination.  "[facility] is reviewing your PA request. Typically an electronic response will be received within 72 hours" 

## 2017-07-31 NOTE — Telephone Encounter (Signed)
Pt request refill for methylphenidate (RITALIN) 20 MG tablet and modafinil (PROVIGIL) 200 MG tablet sent to [facility]

## 2017-08-01 NOTE — Telephone Encounter (Signed)
[id number]. MODAFINIL TAB 200MG  is approved through 01/31/2018. For further questions, call [phone]. Faxed notification to [facility], [place] at [phone]. Received fax confirmation.

## 2017-08-02 ENCOUNTER — Telehealth: Payer: Self-pay | Admitting: *Deleted

## 2017-08-02 DIAGNOSIS — G35 Multiple sclerosis: Secondary | ICD-10-CM

## 2017-08-02 LAB — BASIC METABOLIC PANEL
BUN / CREAT RATIO: 6 — AB (ref 9–23)
BUN: 4 mg/dL — ABNORMAL LOW (ref 6–24)
CO2: 26 mmol/L (ref 20–29)
Calcium: 9.1 mg/dL (ref 8.7–10.2)
Chloride: 93 mmol/L — ABNORMAL LOW (ref 96–106)
Creatinine, Ser: 0.71 mg/dL (ref 0.57–1.00)
GFR, EST AFRICAN AMERICAN: 111 mL/min/{1.73_m2} (ref >59)
GFR, EST NON AFRICAN AMERICAN: 96 mL/min/{1.73_m2} (ref >59)
Glucose: 100 mg/dL — ABNORMAL HIGH (ref 65–99)
POTASSIUM: 3.5 mmol/L (ref 3.5–5.2)
SODIUM: 134 mmol/L (ref 134–144)

## 2017-08-02 MED ORDER — METHYLPHENIDATE HCL 20 MG PO TABS: 20.0000 mg | ORAL_TABLET | Freq: Every day | ORAL | 0 refills | Status: DC

## 2017-08-02 NOTE — Telephone Encounter (Signed)
Took call from phone staff. Spoke with Summer Henderson from [facility] in [place], [place]. He has tried ordering Ritalin the past few weeks but they do not have in stock. Wondering if Dr. Anne Hahn can send new rx to: [phone] [place], [place] - [address] [phone] (Phone) [phone] (Fax)  They have some in stock at their location. Once he sees rx called in at that location he will cx rx on his end. Advised I will send MD a request.

## 2017-08-02 NOTE — Telephone Encounter (Signed)
The Ritalin prescription was sent in.

## 2017-08-30 ENCOUNTER — Other Ambulatory Visit: Payer: Self-pay | Admitting: Neurology

## 2017-08-30 DIAGNOSIS — Z5181 Encounter for therapeutic drug level monitoring: Secondary | ICD-10-CM

## 2017-08-31 ENCOUNTER — Telehealth: Payer: Self-pay | Admitting: *Deleted

## 2017-08-31 NOTE — Telephone Encounter (Signed)
Faxed completed/signed Tysabri pt status report and reauth questionnaire to [place] [facility] prescribing program at [phone]. Received fax confirmation.  Patient last infusion was on 08/08/17. Her next scheduled infusion is on 09/06/17. She will also need blood work when she comes that day which includes: CMP, CBC, JCV. Placed her on the lab schedule.

## 2017-08-31 NOTE — Telephone Encounter (Signed)
Received fax from touch prescribing program that patient re-authorized from 09/21/17-03/22/18.  Pt enrollment number: [id number]. Site: [facility], Auth# [id number].

## 2017-09-06 ENCOUNTER — Other Ambulatory Visit: Payer: Self-pay

## 2017-09-07 ENCOUNTER — Other Ambulatory Visit: Payer: Self-pay | Admitting: Neurology

## 2017-09-07 ENCOUNTER — Other Ambulatory Visit: Payer: Self-pay | Admitting: Cardiology

## 2017-09-07 DIAGNOSIS — G35 Multiple sclerosis: Secondary | ICD-10-CM

## 2017-09-07 MED ORDER — METHYLPHENIDATE HCL 20 MG PO TABS: 20.0000 mg | ORAL_TABLET | Freq: Every day | ORAL | 0 refills | Status: DC

## 2017-09-07 NOTE — Telephone Encounter (Signed)
Pt called she is requesting refill for methylphenidate (RITALIN) 20 MG tablet sent to [facility]. Pt said [facility] month [facility] to [facility] to [facility]. Pt said [facility] this morning and they have it in stock. She is asking if this can be done today. She is aware the clinic closes at noon. [facility] will [facility] later this afternoon.

## 2017-09-10 NOTE — Addendum Note (Signed)
Addended by: Tamera Stands D on: 09/10/2017 03:06 PM   Modules accepted: Orders

## 2017-09-11 LAB — COMPREHENSIVE METABOLIC PANEL
A/G RATIO: 1.8 (ref 1.2–2.2)
ALT: 15 IU/L (ref 0–32)
AST: 14 IU/L (ref 0–40)
Albumin: 4.1 g/dL (ref 3.5–5.5)
Alkaline Phosphatase: 140 IU/L — ABNORMAL HIGH (ref 39–117)
BUN/Creatinine Ratio: 5 — ABNORMAL LOW (ref 9–23)
BUN: 4 mg/dL — ABNORMAL LOW (ref 6–24)
Bilirubin Total: 0.3 mg/dL (ref 0.0–1.2)
CO2: 23 mmol/L (ref 20–29)
Calcium: 9 mg/dL (ref 8.7–10.2)
Chloride: 95 mmol/L — ABNORMAL LOW (ref 96–106)
Creatinine, Ser: 0.77 mg/dL (ref 0.57–1.00)
GFR calc Af Amer: 101 mL/min/{1.73_m2} (ref >59)
GFR calc non Af Amer: 87 mL/min/{1.73_m2} (ref >59)
GLOBULIN, TOTAL: 2.3 g/dL (ref 1.5–4.5)
Glucose: 103 mg/dL — ABNORMAL HIGH (ref 65–99)
POTASSIUM: 3.6 mmol/L (ref 3.5–5.2)
SODIUM: 135 mmol/L (ref 134–144)
Total Protein: 6.4 g/dL (ref 6.0–8.5)

## 2017-09-11 LAB — CBC WITH DIFFERENTIAL/PLATELET
Basophils Absolute: 0 10*3/uL (ref 0.0–0.2)
Basos: 0 %
EOS (ABSOLUTE): 0.3 10*3/uL (ref 0.0–0.4)
Eos: 3 %
HEMATOCRIT: 36 % (ref 34.0–46.6)
Hemoglobin: 12.4 g/dL (ref 11.1–15.9)
Immature Grans (Abs): 0 10*3/uL (ref 0.0–0.1)
Immature Granulocytes: 0 %
Lymphocytes Absolute: 4.7 10*3/uL — ABNORMAL HIGH (ref 0.7–3.1)
Lymphs: 44 %
MCH: 28.9 pg (ref 26.6–33.0)
MCHC: 34.4 g/dL (ref 31.5–35.7)
MCV: 84 fL (ref 79–97)
MONOCYTES: 7 %
Monocytes Absolute: 0.8 10*3/uL (ref 0.1–0.9)
Neutrophils Absolute: 5 10*3/uL (ref 1.4–7.0)
Neutrophils: 46 %
Platelets: 387 10*3/uL — ABNORMAL HIGH (ref 150–379)
RBC: 4.29 x10E6/uL (ref 3.77–5.28)
RDW: 15.7 % — AB (ref 12.3–15.4)
WBC: 10.7 10*3/uL (ref 3.4–10.8)

## 2017-09-13 ENCOUNTER — Telehealth: Payer: Self-pay | Admitting: *Deleted

## 2017-09-13 ENCOUNTER — Encounter: Payer: Self-pay | Admitting: Neurology

## 2017-09-13 ENCOUNTER — Telehealth: Payer: Self-pay | Admitting: Neurology

## 2017-09-13 ENCOUNTER — Ambulatory Visit (INDEPENDENT_AMBULATORY_CARE_PROVIDER_SITE_OTHER): Payer: Medicare Other | Admitting: Neurology

## 2017-09-13 ENCOUNTER — Telehealth: Payer: Self-pay | Admitting: Radiology

## 2017-09-13 DIAGNOSIS — G35 Multiple sclerosis: Secondary | ICD-10-CM | POA: Diagnosis not present

## 2017-09-13 MED ORDER — ALPRAZOLAM 0.5 MG PO TABS: ORAL_TABLET | ORAL | 0 refills | Status: DC

## 2017-09-13 NOTE — Telephone Encounter (Signed)
Pt called she is wanting MRI here at [facility] or GI but she really wants it done here in the trailer. When she had infusion RN's were telling her about the trailer coming 2 days/week. Please call, please LVM to call you back if she is not available.

## 2017-09-13 NOTE — Progress Notes (Signed)
Reason for visit: Multiple sclerosis  Summer Henderson is an 57 y.o. female  History of present illness:  Summer Henderson is a 57 year old right-handed white female with history of multiple sclerosis.  The patient is on Tysabri.  She has not noted any new definite MS exacerbations.  She has noted within the last several months that her left leg is slightly weaker, she has difficulty lifting the leg at times.  The patient has had 2 episodes where she felt weak all over, she may have to crawl on the floor, this will last several minutes and then resolves.  She did not feel dizzy or fainty during these events.  The patient has been told she has cataracts, she does have some blurriness of vision, she has difficulty driving at night.  She returns to this office for an evaluation.  Past Medical History:  Diagnosis Date  . Anxiety   . Arthritis   . CAD (coronary artery disease)    a. 06/2014 abnl MV;  b. 06/2014 Cath: LM nl, LAD 14m (2.75x28 Promus DES), LCX nl, RCA 60-51m (FFR = 0.80 -> 3.0x24 Promus DES), EF 55-65%.  . Carpal tunnel syndrome   . Cervical dysplasia   . Chronic fatigue   . Depression   . GERD (gastroesophageal reflux disease)   . Hypertension   . Mitral valve prolapse   . Multiple sclerosis (HCC)   . Pneumonia   . Prolonged QT interval   . RLS (restless legs syndrome) 04/28/2015    Past Surgical History:  Procedure Laterality Date  . ABDOMINAL HYSTERECTOMY    . BICEPS TENDON REPAIR    . bladder resuspension    . CARPAL TUNNEL RELEASE    . CERVICAL SPINE SURGERY  10-16-2009  . CORONARY ANGIOPLASTY WITH STENT PLACEMENT    . DILATION AND CURETTAGE OF UTERUS    . FOOT SURGERY Bilateral    For bunions  . HAMMER TOE SURGERY Left   . LEFT HEART CATHETERIZATION WITH CORONARY ANGIOGRAM N/A 06/29/2014   Procedure: LEFT HEART CATHETERIZATION WITH CORONARY ANGIOGRAM;  Surgeon: Micheline Chapman, MD;  Location: [facility] CATH LAB;  Service: Cardiovascular;  Laterality: N/A;  . ORIF TOE FRACTURE  Right   . Prolong QTC     Heart  . rectovaginal fistula repair      Family History  Problem Relation Age of Onset  . Melanoma Mother   . Colon polyps Mother   . Diabetes Mother   . Breast cancer Maternal Grandmother   . Colon cancer Maternal Grandmother   . Brain cancer Maternal Grandfather   . Lung cancer Maternal Grandfather   . Diabetes Maternal Grandfather   . Kidney disease Neg Hx   . Esophageal cancer Neg Hx   . Gallbladder disease Neg Hx     Social history:  reports that she has been smoking cigarettes.  She has a 17.50 pack-year smoking history. She has never used smokeless tobacco. She reports that she does not drink alcohol or use drugs.    Allergies  Allergen Reactions  . Demerol [Meperidine] Other (See Comments)    [derm] rash, swelling, itching Other reaction(s): Other (See Comments) [derm] rash, swelling, itching  . Erythromycin   . Morphine And Related   . Sulfa Antibiotics   . Diflucan [Fluconazole] Other (See Comments)    States she has prolonged QT and cannot take this.  . Other     Other reaction(s): Other (See Comments) Z-pak causes EKG changes, was told by cardiologist  not to take this.  . Sulfacetamide Sodium   . Erythromycin Base Rash    [Derm] rash  . Fesoterodine Palpitations  . Tape     Adhesive tape  . Toviaz [Fesoterodine Fumarate Er] Palpitations    Medications:  Prior to Admission medications   Medication Sig Start Date End Date Taking? Authorizing Provider  aspirin 81 MG tablet Take 1 tablet (81 mg total) by mouth daily. 07/24/14  Yes Lewayne Bunting, MD  atorvastatin (LIPITOR) 40 MG tablet Take 1 tablet (40 mg total) by mouth daily at 6 PM. 05/30/17  Yes Crenshaw, Madolyn Frieze, MD  DULoxetine (CYMBALTA) 60 MG capsule TAKE 1 CAPSULE (60 MG TOTAL) BY MOUTH 2 (TWO) TIMES DAILY. 10/17/16  Yes York Spaniel, MD  ENABLEX 7.5 MG 24 hr tablet Take 1 tablet by mouth daily. 06/18/14  Yes [provider]  esomeprazole (NEXIUM) 40 MG  capsule TAKE 1 CAPSULE (40 MG TOTAL) BY MOUTH DAILY AT 12 NOON. 09/07/17  Yes Crenshaw, Madolyn Frieze, MD  furosemide (LASIX) 20 MG tablet ONE TABLET DAILY AS NEEDED FOR SWELLING 07/09/17  Yes Crenshaw, Madolyn Frieze, MD  gabapentin (NEURONTIN) 100 MG capsule Take 1 capsule (100 mg total) by mouth 3 (three) times daily. 02/29/16  Yes York Spaniel, MD  losartan-hydrochlorothiazide (HYZAAR) 100-12.5 MG tablet Take 1 tablet by mouth daily. 05/30/17  Yes Lewayne Bunting, MD  methocarbamol (ROBAXIN) 500 MG tablet Take 1 tablet (500 mg total) by mouth 3 (three) times daily. 11/29/15  Yes York Spaniel, MD  methylphenidate (RITALIN) 20 MG tablet Take 1 tablet (20 mg total) by mouth daily. At noon 09/07/17  Yes York Spaniel, MD  metoprolol succinate (TOPROL-XL) 50 MG 24 hr tablet Take 1 tablet (50 mg total) by mouth daily. 05/30/17  Yes Lewayne Bunting, MD  modafinil (PROVIGIL) 200 MG tablet Take 1 tablet (200 mg total) by mouth daily. 07/31/17  Yes York Spaniel, MD  NASONEX 50 MCG/ACT nasal spray Place 50 sprays into the nose daily. 09/16/12  Yes [provider]  natalizumab (TYSABRI) 300 MG/15ML injection Inject into the vein.   Yes [provider]    ROS:  Out of a complete 14 system review of symptoms, the patient complains only of the following symptoms, and all other reviewed systems are negative.  Fatigue Heat intolerance Bruising easily  Blood pressure 118/78, pulse 76, height 5\' 1"  (1.549 m), weight 151 lb 8 oz (68.7 kg), SpO2 97 %.  Physical Exam  General: The patient is alert and cooperative at the time of the examination.  Skin: No significant peripheral edema is noted.   Neurologic Exam  Mental status: The patient is alert and oriented x 3 at the time of the examination. The patient has apparent normal recent and remote memory, with an apparently normal attention span and concentration ability.   Cranial nerves: Facial symmetry is present. Speech is normal, no  aphasia or dysarthria is noted. Extraocular movements are full. Visual fields are full.  Pupils are equal, round, and reactive to light.  Discs are flat bilaterally.  Motor: The patient has good strength in all 4 extremities.  Sensory examination: Soft touch sensation is symmetric on the face, arms, and legs.  Coordination: The patient has good finger-nose-finger and heel-to-shin bilaterally.  Gait and station: The patient has a normal gait. Tandem gait is unsteady. Romberg is negative, but is unsteady. No drift is seen.  Reflexes: Deep tendon reflexes are symmetric.   Assessment/Plan:  1.  Multiple sclerosis  2.  Mild gait instability  The patient is on Tysabri.  The patient has already had blood work done, her last MRI of the brain was done in February 2018.  We will repeat MRI of the brain.  She will follow-up in 6 months.  Marlan Palau MD 09/13/2017 11:31 AM  [facility] [address] [place], [place] [address]  Phone [phone] Fax [phone]

## 2017-09-13 NOTE — Telephone Encounter (Signed)
Spoke w/ Shelba Flake and [facility] placed in [facility] lock box for routine pick up on 09/10/17. Results pending.

## 2017-09-13 NOTE — Telephone Encounter (Signed)
[facility] they will reach out to patient to schedule.  Medicare auth: NPR Medicaid auth: NPR Ref # Summer Henderson at 11:31 AM [place].

## 2017-09-13 NOTE — Telephone Encounter (Signed)
I spoke to the patient and informed her because of her [facility] medicare/medicaid she couldn't have her MRI here but I informed her that I sent the order to [facility] and that they are apart of [facility] so we would be able to see everything.. She stated that she will call them back and schedule it there..  She also informed me that she is claustrophobic and will need something to help her.

## 2017-09-13 NOTE — Telephone Encounter (Signed)
I will send in a prescription for alprazolam for the MRI.

## 2017-09-14 ENCOUNTER — Emergency Department (HOSPITAL_BASED_OUTPATIENT_CLINIC_OR_DEPARTMENT_OTHER)
Admission: EM | Admit: 2017-09-14 | Discharge: 2017-09-14 | Disposition: A | Discharge status: Home / Self Care | Payer: Medicare Other | Attending: Emergency Medicine | Admitting: Emergency Medicine

## 2017-09-14 ENCOUNTER — Emergency Department (HOSPITAL_BASED_OUTPATIENT_CLINIC_OR_DEPARTMENT_OTHER): Payer: Medicare Other

## 2017-09-14 ENCOUNTER — Encounter (HOSPITAL_BASED_OUTPATIENT_CLINIC_OR_DEPARTMENT_OTHER): Payer: Self-pay | Admitting: *Deleted

## 2017-09-14 ENCOUNTER — Other Ambulatory Visit: Payer: Self-pay

## 2017-09-14 DIAGNOSIS — Z79899 Other long term (current) drug therapy: Secondary | ICD-10-CM | POA: Insufficient documentation

## 2017-09-14 DIAGNOSIS — Z7982 Long term (current) use of aspirin: Secondary | ICD-10-CM | POA: Diagnosis not present

## 2017-09-14 DIAGNOSIS — Z955 Presence of coronary angioplasty implant and graft: Secondary | ICD-10-CM | POA: Diagnosis not present

## 2017-09-14 DIAGNOSIS — I2510 Atherosclerotic heart disease of native coronary artery without angina pectoris: Secondary | ICD-10-CM | POA: Diagnosis not present

## 2017-09-14 DIAGNOSIS — R0789 Other chest pain: Secondary | ICD-10-CM | POA: Insufficient documentation

## 2017-09-14 DIAGNOSIS — F17210 Nicotine dependence, cigarettes, uncomplicated: Secondary | ICD-10-CM | POA: Insufficient documentation

## 2017-09-14 DIAGNOSIS — F419 Anxiety disorder, unspecified: Secondary | ICD-10-CM | POA: Diagnosis not present

## 2017-09-14 DIAGNOSIS — I10 Essential (primary) hypertension: Secondary | ICD-10-CM | POA: Insufficient documentation

## 2017-09-14 DIAGNOSIS — F329 Major depressive disorder, single episode, unspecified: Secondary | ICD-10-CM | POA: Insufficient documentation

## 2017-09-14 DIAGNOSIS — R0781 Pleurodynia: Secondary | ICD-10-CM

## 2017-09-14 MED ORDER — NAPROXEN 500 MG PO TABS: 500.0000 mg | ORAL_TABLET | Freq: Two times a day (BID) | ORAL | 0 refills | Status: DC | PRN

## 2017-09-14 MED ORDER — HYDROCODONE-ACETAMINOPHEN 5-325 MG PO TABS: 1.0000 | ORAL_TABLET | Freq: Four times a day (QID) | ORAL | 0 refills | Status: DC | PRN

## 2017-09-14 MED ORDER — METHOCARBAMOL 500 MG PO TABS: 500.0000 mg | ORAL_TABLET | Freq: Three times a day (TID) | ORAL | 1 refills | Status: DC | PRN

## 2017-09-14 MED FILL — NAPROXEN 500 MG TABLET: 500 | 15 days supply | Qty: 30 | Fill #0

## 2017-09-14 MED FILL — METHOCARBAMOL 500 MG TABLET: 500 | 10 days supply | Qty: 30 | Fill #0

## 2017-09-14 MED FILL — HYDROCODON-APAP 5-325: 5-325 | 4 days supply | Qty: 6 | Fill #0

## 2017-09-14 NOTE — Discharge Instructions (Signed)
It was my pleasure taking care of you today!   Naproxen as needed for mild to moderate pain.  Robaxin is your muscle relaxer.  I have given you a very short course of stronger pain medication to take only as needed for severe pain.  Ice or heat to the affected area can help as well.  Please follow-up with your primary care doctor if you are not feeling improved over the next week.  Return to the ER for new or worsening symptoms, any additional concerns.

## 2017-09-14 NOTE — ED Triage Notes (Signed)
Pt c/o right rib pain after heavy lifting x 3 days ago

## 2017-09-14 NOTE — ED Provider Notes (Signed)
[facility] EMERGENCY DEPARTMENT Provider Note   CSN: [id number] Arrival date & time: 09/14/17  1539     History   Chief Complaint Chief Complaint  Patient presents with  . RIb pain    HPI Summer Henderson is a 57 y.o. female.  The history is provided by the patient and medical records. No language interpreter was used.   Summer Henderson is a 57 y.o. female  with a PMH as listed below who presents to the Emergency Department complaining of right-sided rib cage pain for the last 3 days.  She states that 4 days ago, she was lifting her signs electric power wheelchair to place in a vehicle.  She knows that she overdid it started to feel tightness to the right rib cage area.  When she awoke the next morning, she had severe pain which was worse with coughing and taking breaths.  Also worse with palpation.  She had 2 Percocet left over from several years ago but she took and did dull the pain.  She also tried a muscle relaxer.  Denies any difficulty breathing.   Past Medical History:  Diagnosis Date  . Anxiety   . Arthritis   . CAD (coronary artery disease)    a. 06/2014 abnl MV;  b. 06/2014 Cath: LM nl, LAD 63m (2.75x28 Promus DES), LCX nl, RCA 60-68m (FFR = 0.80 -> 3.0x24 Promus DES), EF 55-65%.  . Carpal tunnel syndrome   . Cervical dysplasia   . Chronic fatigue   . Depression   . GERD (gastroesophageal reflux disease)   . Hypertension   . Mitral valve prolapse   . Multiple sclerosis (HCC)   . Pneumonia   . Prolonged QT interval   . RLS (restless legs syndrome) 04/28/2015    Patient Active Problem List   Diagnosis Date Noted  . Synovitis and tenosynovitis 10/18/2015  . Long term current use of systemic steroids 08/12/2015  . Recurrent major depressive disorder, in partial remission (HCC) 07/06/2015  . Closed fracture of base of fifth metatarsal bone 07/05/2015  . Foot sprain 07/05/2015  . RLS (restless legs syndrome) 04/28/2015  . Hyperlipidemia 08/12/2014  .  Triggering of digit 07/22/2014  . CAD S/P LAD and RCA DES 06/29/14 06/30/2014  . Arteriosclerosis of coronary artery 06/30/2014  . Angina pectoris (HCC) 06/29/2014  . Abnormal cardiovascular function study 06/17/2014  . Rectal bleeding 05/13/2014  . Special screening for malignant neoplasms, colon 05/13/2014  . Abnormal CAT scan 04/22/2014  . 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 Vaginal pain 04/22/2014  . Hypokalemia 04/22/2014  . Purulent vaginal discharge   . Triceps tendinitis 03/09/2014  . Abnormal finding on mammography 12/31/2013  . Allergic rhinitis 12/31/2013  . Anxiety disorder 12/31/2013  . Breast discharge 12/31/2013  . Carpal tunnel syndrome 12/31/2013  . Chronic obstructive pulmonary disease (HCC) 12/31/2013  . Diastolic dysfunction 12/31/2013  . Disease of tricuspid valve 12/31/2013  . Enthesopathy of ankle and tarsus 12/31/2013  . Acid reflux 12/31/2013  . Adnexal pain 12/31/2013  . Bloodgood disease 12/31/2013  . Incomplete bladder emptying 12/31/2013  . Mild episode of recurrent major depressive disorder (HCC) 12/31/2013  . Disorder of mitral valve 12/31/2013  . Interdigital neuralgia 12/31/2013  . 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 Arthritis, degenerative 12/31/2013  . Osteopenia 12/31/2013  . Pain in urethra 12/31/2013  . Disturbance in sleep behavior 12/31/2013  . Deafness, sensorineural 12/31/2013  . Smokers' cough (HCC) 12/31/2013  . Compulsive tobacco user syndrome 12/31/2013  . Absence  of bladder continence 12/31/2013  . Discharge from the vagina 12/31/2013  . Edema 06/04/2013  . Biceps tendinitis 05/21/2013  . Backhand tennis elbow 05/21/2013  . Cyst of finger 05/12/2013  . Epicondylitis elbow, medial 04/01/2013  . Mitral valve prolapse 02/20/2013  . Essential hypertension 02/20/2013  . Prolonged Q-T interval on ECG 02/20/2013  . Tobacco abuse 02/20/2013  . Acquired trigger finger 06/04/2012  . Abnormality of gait 11/27/2011  . Encounter for  therapeutic drug monitoring 11/27/2011  . Other malaise and fatigue 11/27/2011  . Multiple sclerosis (HCC) 11/27/2011    Past Surgical History:  Procedure Laterality Date  . ABDOMINAL HYSTERECTOMY    . BICEPS TENDON REPAIR    . bladder resuspension    . CARPAL TUNNEL RELEASE    . CERVICAL SPINE SURGERY  10-16-2009  . CORONARY ANGIOPLASTY WITH STENT PLACEMENT    . DILATION AND CURETTAGE OF UTERUS    . FOOT SURGERY Bilateral    For bunions  . HAMMER TOE SURGERY Left   . LEFT HEART CATHETERIZATION WITH CORONARY ANGIOGRAM N/A 06/29/2014   Procedure: LEFT HEART CATHETERIZATION WITH CORONARY ANGIOGRAM;  Surgeon: Micheline Chapman, MD;  Location: [facility] CATH LAB;  Service: Cardiovascular;  Laterality: N/A;  . ORIF TOE FRACTURE Right   . Prolong QTC     Heart  . rectovaginal fistula repair       OB History   None      Home Medications    Prior to Admission medications   Medication Sig Start Date End Date Taking? Authorizing Provider  ALPRAZolam Prudy Feeler) 0.5 MG tablet Take 2 tablets approximately 45 minutes prior to the MRI study, take a third tablet if needed. 09/13/17   York Spaniel, MD  aspirin 81 MG tablet Take 1 tablet (81 mg total) by mouth daily. 07/24/14   Lewayne Bunting, MD  atorvastatin (LIPITOR) 40 MG tablet Take 1 tablet (40 mg total) by mouth daily at 6 PM. 05/30/17   Crenshaw, Madolyn Frieze, MD  DULoxetine (CYMBALTA) 60 MG capsule TAKE 1 CAPSULE (60 MG TOTAL) BY MOUTH 2 (TWO) TIMES DAILY. 10/17/16   York Spaniel, MD  ENABLEX 7.5 MG 24 hr tablet Take 1 tablet by mouth daily. 06/18/14   [provider]  esomeprazole (NEXIUM) 40 MG capsule TAKE 1 CAPSULE (40 MG TOTAL) BY MOUTH DAILY AT 12 NOON. 09/07/17   Lewayne Bunting, MD  furosemide (LASIX) 20 MG tablet ONE TABLET DAILY AS NEEDED FOR SWELLING 07/09/17   Lewayne Bunting, MD  gabapentin (NEURONTIN) 100 MG capsule Take 1 capsule (100 mg total) by mouth 3 (three) times daily. 02/29/16   York Spaniel, MD    HYDROcodone-acetaminophen (NORCO/VICODIN) 5-325 MG tablet Take 1 tablet by mouth every 6 (six) hours as needed for severe pain. 09/14/17   Ward, Chase Picket, PA-C  losartan-hydrochlorothiazide (HYZAAR) 100-12.5 MG tablet Take 1 tablet by mouth daily. 05/30/17   Lewayne Bunting, MD  methocarbamol (ROBAXIN) 500 MG tablet Take 1 tablet (500 mg total) by mouth every 8 (eight) hours as needed for muscle spasms. 09/14/17   Ward, Chase Picket, PA-C  methylphenidate (RITALIN) 20 MG tablet Take 1 tablet (20 mg total) by mouth daily. At noon 09/07/17   York Spaniel, MD  metoprolol succinate (TOPROL-XL) 50 MG 24 hr tablet Take 1 tablet (50 mg total) by mouth daily. 05/30/17   Lewayne Bunting, MD  modafinil (PROVIGIL) 200 MG tablet Take 1 tablet (200 mg total) by mouth daily. 07/31/17  York Spaniel, MD  naproxen (NAPROSYN) 500 MG tablet Take 1 tablet (500 mg total) by mouth 2 (two) times daily as needed for moderate pain. 09/14/17   Ward, Chase Picket, PA-C  NASONEX 50 MCG/ACT nasal spray Place 50 sprays into the nose daily. 09/16/12   [provider]  natalizumab (TYSABRI) 300 MG/15ML injection Inject into the vein.    [provider]    Family History Family History  Problem Relation Age of Onset  . Melanoma Mother   . Colon polyps Mother   . Diabetes Mother   . Breast cancer Maternal Grandmother   . Colon cancer Maternal Grandmother   . Brain cancer Maternal Grandfather   . Lung cancer Maternal Grandfather   . Diabetes Maternal Grandfather   . Kidney disease Neg Hx   . Esophageal cancer Neg Hx   . Gallbladder disease Neg Hx     Social History Social History   Tobacco Use  . Smoking status: Current Every Day Smoker    Packs/day: 0.50    Years: 35.00    Pack years: 17.50    Types: Cigarettes    Last attempt to quit: 06/30/2014    Years since quitting: 3.2  . Smokeless tobacco: Never Used  Substance Use Topics  . Alcohol use: No    Alcohol/week: 0.0 oz  .  Drug use: No     Allergies   Demerol [meperidine]; Erythromycin; Morphine and related; Sulfa antibiotics; Diflucan [fluconazole]; Other; Sulfacetamide sodium; Erythromycin base; Fesoterodine; Tape; and Toviaz [fesoterodine fumarate er]   Review of Systems Review of Systems  Constitutional: Negative for fever.  Respiratory: Negative for shortness of breath.   Musculoskeletal: Positive for arthralgias and myalgias. Negative for back pain.  Skin: Negative for color change.  Neurological: Negative for weakness.     Physical Exam Updated Vital Signs BP 114/75   Pulse 73   Temp 98.2 F (36.8 C)   Resp 16   Ht 5\' 1"  (1.549 m)   Wt 68.5 kg (151 lb)   SpO2 100%   BMI 28.53 kg/m   Physical Exam  Constitutional: She appears well-developed and well-nourished. No distress.  HENT:  Head: Normocephalic and atraumatic.  Neck: Neck supple.  Cardiovascular: Normal rate, regular rhythm and normal heart sounds.  No murmur heard. Pulmonary/Chest: Effort normal and breath sounds normal. No respiratory distress.  Lungs clear to auscultation bilaterally.  Musculoskeletal: Normal range of motion.  Tenderness to palpation of right mid to lateral rib cage area with no overlying skin changes.  Neurological: She is alert.  Skin: Skin is warm and dry.  Nursing note and vitals reviewed.    ED Treatments / Results  Labs (all labs ordered are listed, but only abnormal results are displayed) Labs Reviewed - No data to display  EKG None  Radiology Dg Ribs Unilateral W/chest Right  Result Date: 09/14/2017 CLINICAL DATA:  Acute RIGHT rib pain following injury 3 days ago. Initial encounter. EXAM: RIGHT RIBS AND CHEST - 3+ VIEW COMPARISON:  06/26/2015 and prior radiographs FINDINGS: The cardiomediastinal silhouette is unremarkable. Coronary stent is noted. There is no evidence of focal airspace disease, pulmonary edema, suspicious pulmonary nodule/mass, pleural effusion, or pneumothorax. No acute  bony abnormalities are identified. Remote RIGHT rib fractures are identified. IMPRESSION: No acute rib fracture.  Remote RIGHT rib fractures are identified. No evidence of acute cardiopulmonary disease. Electronically Signed   By: Harmon Pier M.D.   On: 09/14/2017 16:34    Procedures Procedures (including critical care  time)  Medications Ordered in ED Medications - No data to display   Initial Impression / Assessment and Plan / ED Course  I have reviewed the triage vital signs and the nursing notes.  Pertinent labs & imaging results that were available during my care of the patient were reviewed by me and considered in my medical decision making (see chart for details).    Summer Henderson is a 57 y.o. female who presents to ED for right mid to lateral rib cage pain over the last 3 days after heavy lifting.  Lungs clear to auscultation bilaterally.  Tenderness to the affected area without overlying skin changes.  X-ray with no acute findings.  Will treat symptomatically and have patient follow-up with PCP if no improvement.  Reasons to return to ER discussed and all questions answered.    Final Clinical Impressions(s) / ED Diagnoses   Final diagnoses:  Rib pain on right side    ED Discharge Orders        Ordered    methocarbamol (ROBAXIN) 500 MG tablet  Every 8 hours PRN     09/14/17 1711    naproxen (NAPROSYN) 500 MG tablet  2 times daily PRN     09/14/17 1711    HYDROcodone-acetaminophen (NORCO/VICODIN) 5-325 MG tablet  Every 6 hours PRN     09/14/17 1711       Ward, Chase Picket, PA-C 09/14/17 1715    Melene Plan, DO 09/14/17 2114

## 2017-09-14 NOTE — ED Notes (Signed)
Pt teaching provided on medications that may cause drowsiness. Pt instructed not to drive or operate heavy machinery while taking the prescribed medication. Pt verbalized understanding.   

## 2017-09-17 ENCOUNTER — Telehealth: Payer: Self-pay | Admitting: Neurology

## 2017-09-17 NOTE — Telephone Encounter (Signed)
JC viral antibody panel is negative, index value is 0.13.

## 2017-09-22 ENCOUNTER — Ambulatory Visit (HOSPITAL_BASED_OUTPATIENT_CLINIC_OR_DEPARTMENT_OTHER)
Admission: RE | Admit: 2017-09-22 | Discharge: 2017-09-22 | Disposition: A | Discharge status: Home / Self Care | Payer: Medicare Other | Source: Ambulatory Visit | Attending: Neurology | Admitting: Neurology

## 2017-09-22 DIAGNOSIS — G35 Multiple sclerosis: Secondary | ICD-10-CM | POA: Insufficient documentation

## 2017-09-22 MED ORDER — GADOBENATE DIMEGLUMINE 529 MG/ML IV SOLN
10.0000 mL | Freq: Once | INTRAVENOUS | Status: AC | PRN
  Administered 2017-09-22: 10 mL via INTRAVENOUS

## 2017-09-23 ENCOUNTER — Telehealth: Payer: Self-pay | Admitting: Neurology

## 2017-09-23 NOTE — Telephone Encounter (Signed)
I called the patient.  MRI of the brain did not show progression from prior study.  I discussed this with patient.   MRI brain 09/23/17:  IMPRESSION: Multiple sclerosis without visible progression. Stable, normal brain volume.

## 2017-11-02 ENCOUNTER — Encounter (HOSPITAL_BASED_OUTPATIENT_CLINIC_OR_DEPARTMENT_OTHER): Payer: Self-pay | Admitting: Emergency Medicine

## 2017-11-02 ENCOUNTER — Other Ambulatory Visit: Payer: Self-pay

## 2017-11-02 ENCOUNTER — Emergency Department (HOSPITAL_BASED_OUTPATIENT_CLINIC_OR_DEPARTMENT_OTHER)
Admission: EM | Admit: 2017-11-02 | Discharge: 2017-11-02 | Disposition: A | Discharge status: Home / Self Care | Payer: Medicare Other | Attending: Emergency Medicine | Admitting: Emergency Medicine

## 2017-11-02 ENCOUNTER — Emergency Department (HOSPITAL_BASED_OUTPATIENT_CLINIC_OR_DEPARTMENT_OTHER): Payer: Medicare Other

## 2017-11-02 DIAGNOSIS — Y998 Other external cause status: Secondary | ICD-10-CM | POA: Diagnosis not present

## 2017-11-02 DIAGNOSIS — F17210 Nicotine dependence, cigarettes, uncomplicated: Secondary | ICD-10-CM | POA: Diagnosis not present

## 2017-11-02 DIAGNOSIS — I10 Essential (primary) hypertension: Secondary | ICD-10-CM | POA: Insufficient documentation

## 2017-11-02 DIAGNOSIS — S91119A Laceration without foreign body of unspecified toe without damage to nail, initial encounter: Secondary | ICD-10-CM | POA: Diagnosis not present

## 2017-11-02 DIAGNOSIS — W19XXXA Unspecified fall, initial encounter: Secondary | ICD-10-CM | POA: Insufficient documentation

## 2017-11-02 DIAGNOSIS — S92532B Displaced fracture of distal phalanx of left lesser toe(s), initial encounter for open fracture: Secondary | ICD-10-CM | POA: Diagnosis not present

## 2017-11-02 DIAGNOSIS — S99922A Unspecified injury of left foot, initial encounter: Secondary | ICD-10-CM | POA: Diagnosis present

## 2017-11-02 DIAGNOSIS — Y9389 Activity, other specified: Secondary | ICD-10-CM | POA: Diagnosis not present

## 2017-11-02 DIAGNOSIS — Z79899 Other long term (current) drug therapy: Secondary | ICD-10-CM | POA: Diagnosis not present

## 2017-11-02 DIAGNOSIS — Z23 Encounter for immunization: Secondary | ICD-10-CM | POA: Insufficient documentation

## 2017-11-02 DIAGNOSIS — Z7982 Long term (current) use of aspirin: Secondary | ICD-10-CM | POA: Insufficient documentation

## 2017-11-02 DIAGNOSIS — Z955 Presence of coronary angioplasty implant and graft: Secondary | ICD-10-CM | POA: Diagnosis not present

## 2017-11-02 DIAGNOSIS — Y9289 Other specified places as the place of occurrence of the external cause: Secondary | ICD-10-CM | POA: Diagnosis not present

## 2017-11-02 DIAGNOSIS — I2510 Atherosclerotic heart disease of native coronary artery without angina pectoris: Secondary | ICD-10-CM | POA: Diagnosis not present

## 2017-11-02 DIAGNOSIS — T148XXA Other injury of unspecified body region, initial encounter: Secondary | ICD-10-CM | POA: Diagnosis not present

## 2017-11-02 MED ORDER — LIDOCAINE HCL 2 % IJ SOLN
INTRAMUSCULAR | Status: AC
  Filled 2017-11-02: qty 20

## 2017-11-02 MED ORDER — CEPHALEXIN 500 MG PO CAPS: 500.0000 mg | ORAL_CAPSULE | Freq: Four times a day (QID) | ORAL | 0 refills | Status: DC

## 2017-11-02 MED ORDER — PENTAFLUOROPROP-TETRAFLUOROETH EX AERO
INHALATION_SPRAY | CUTANEOUS | Status: AC
  Filled 2017-11-02: qty 30

## 2017-11-02 MED ORDER — PENTAFLUOROPROP-TETRAFLUOROETH EX AERO
INHALATION_SPRAY | Freq: Once | CUTANEOUS | Status: AC
  Administered 2017-11-02: 1 via TOPICAL

## 2017-11-02 MED ORDER — LIDOCAINE HCL 2 % IJ SOLN
10.0000 mL | Freq: Once | INTRAMUSCULAR | Status: AC
  Administered 2017-11-02: 200 mg

## 2017-11-02 MED ORDER — TETANUS-DIPHTH-ACELL PERTUSSIS 5-2.5-18.5 LF-MCG/0.5 IM SUSP
0.5000 mL | Freq: Once | INTRAMUSCULAR | Status: AC
  Administered 2017-11-02: 0.5 mL via INTRAMUSCULAR
  Filled 2017-11-02: qty 0.5

## 2017-11-02 MED ORDER — LIDOCAINE HCL (PF) 1 % IJ SOLN
5.0000 mL | Freq: Once | INTRAMUSCULAR | Status: DC
  Filled 2017-11-02: qty 5

## 2017-11-02 NOTE — Discharge Instructions (Signed)
Can take a shower but make sure you pat dry the cut and wrap it back up.  Make sure you are changing the dressing daily.

## 2017-11-02 NOTE — ED Provider Notes (Signed)
[facility] EMERGENCY DEPARTMENT Provider Note   CSN: [id number] Arrival date & time: 11/02/17  1627     History   Chief Complaint Chief Complaint  Patient presents with  . Fall    HPI Summer Henderson is a 57 y.o. female.  Patient is a 57 year old female with a history of MS, coronary artery disease,'s restless leg syndrome, chronic fatigue, with recurrent falls presenting today after a fall.  Patient states that she was outside cleaning her car when she became overheated.  She states she does not tolerate hot weather very well and often times has fallen in the past.  She was going to sit down but went to the back of her car first and fell forward rolling down the hill hitting her knees on the concrete and injuring her right second toe.  She denies head injury, loss of consciousness.  She has no neck pain.  No chest or abdominal pain.  Her left leg feels fine and she is able to move everything.  She was able to ambulate after this and denies any pain in her right hip.  She does have abrasions over her right knee with some bleeding but her biggest concern is her right second toe.  Tetanus shot is up-to-date.  Patient does not take anticoagulation.  The history is provided by the patient.    Past Medical History:  Diagnosis Date  . Anxiety   . Arthritis   . CAD (coronary artery disease)    a. 06/2014 abnl MV;  b. 06/2014 Cath: LM nl, LAD 92m (2.75x28 Promus DES), LCX nl, RCA 60-63m (FFR = 0.80 -> 3.0x24 Promus DES), EF 55-65%.  . Carpal tunnel syndrome   . Cervical dysplasia   . Chronic fatigue   . Depression   . GERD (gastroesophageal reflux disease)   . Hypertension   . Mitral valve prolapse   . Multiple sclerosis (HCC)   . Pneumonia   . Prolonged QT interval   . RLS (restless legs syndrome) 04/28/2015    Patient Active Problem List   Diagnosis Date Noted  . Synovitis and tenosynovitis 10/18/2015  . Long term current use of systemic steroids 08/12/2015  .  Recurrent major depressive disorder, in partial remission (HCC) 07/06/2015  . Closed fracture of base of fifth metatarsal bone 07/05/2015  . Foot sprain 07/05/2015  . RLS (restless legs syndrome) 04/28/2015  . Hyperlipidemia 08/12/2014  . Triggering of digit 07/22/2014  . CAD S/P LAD and RCA DES 06/29/14 06/30/2014  . Arteriosclerosis of coronary artery 06/30/2014  . Angina pectoris (HCC) 06/29/2014  . Abnormal cardiovascular function study 06/17/2014  . Rectal bleeding 05/13/2014  . Special screening for malignant neoplasms, colon 05/13/2014  . Abnormal CAT scan 04/22/2014  . 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 Vaginal pain 04/22/2014  . Hypokalemia 04/22/2014  . Purulent vaginal discharge   . Triceps tendinitis 03/09/2014  . Abnormal finding on mammography 12/31/2013  . Allergic rhinitis 12/31/2013  . Anxiety disorder 12/31/2013  . Breast discharge 12/31/2013  . Carpal tunnel syndrome 12/31/2013  . Chronic obstructive pulmonary disease (HCC) 12/31/2013  . Diastolic dysfunction 12/31/2013  . Disease of tricuspid valve 12/31/2013  . Enthesopathy of ankle and tarsus 12/31/2013  . Acid reflux 12/31/2013  . Adnexal pain 12/31/2013  . Bloodgood disease 12/31/2013  . Incomplete bladder emptying 12/31/2013  . Mild episode of recurrent major depressive disorder (HCC) 12/31/2013  . Disorder of mitral valve 12/31/2013  . Interdigital neuralgia 12/31/2013  . 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  Arthritis, degenerative 12/31/2013  . Osteopenia 12/31/2013  . Pain in urethra 12/31/2013  . Disturbance in sleep behavior 12/31/2013  . Deafness, sensorineural 12/31/2013  . Smokers' cough (HCC) 12/31/2013  . Compulsive tobacco user syndrome 12/31/2013  . Absence of bladder continence 12/31/2013  . Discharge from the vagina 12/31/2013  . Edema 06/04/2013  . Biceps tendinitis 05/21/2013  . Backhand tennis elbow 05/21/2013  . Cyst of finger 05/12/2013  . Epicondylitis elbow, medial 04/01/2013    . Mitral valve prolapse 02/20/2013  . Essential hypertension 02/20/2013  . Prolonged Q-T interval on ECG 02/20/2013  . Tobacco abuse 02/20/2013  . Acquired trigger finger 06/04/2012  . Abnormality of gait 11/27/2011  . Encounter for therapeutic drug monitoring 11/27/2011  . Other malaise and fatigue 11/27/2011  . Multiple sclerosis (HCC) 11/27/2011    Past Surgical History:  Procedure Laterality Date  . ABDOMINAL HYSTERECTOMY    . BICEPS TENDON REPAIR    . bladder resuspension    . CARPAL TUNNEL RELEASE    . CERVICAL SPINE SURGERY  10-16-2009  . CORONARY ANGIOPLASTY WITH STENT PLACEMENT    . DILATION AND CURETTAGE OF UTERUS    . FOOT SURGERY Bilateral    For bunions  . HAMMER TOE SURGERY Left   . LEFT HEART CATHETERIZATION WITH CORONARY ANGIOGRAM N/A 06/29/2014   Procedure: LEFT HEART CATHETERIZATION WITH CORONARY ANGIOGRAM;  Surgeon: Micheline Chapman, MD;  Location: [facility] CATH LAB;  Service: Cardiovascular;  Laterality: N/A;  . ORIF TOE FRACTURE Right   . Prolong QTC     Heart  . rectovaginal fistula repair       OB History   None      Home Medications    Prior to Admission medications   Medication Sig Start Date End Date Taking? Authorizing Provider  ALPRAZolam Prudy Feeler) 0.5 MG tablet Take 2 tablets approximately 45 minutes prior to the MRI study, take a third tablet if needed. 09/13/17   York Spaniel, MD  aspirin 81 MG tablet Take 1 tablet (81 mg total) by mouth daily. 07/24/14   Lewayne Bunting, MD  atorvastatin (LIPITOR) 40 MG tablet Take 1 tablet (40 mg total) by mouth daily at 6 PM. 05/30/17   Crenshaw, Madolyn Frieze, MD  DULoxetine (CYMBALTA) 60 MG capsule TAKE 1 CAPSULE (60 MG TOTAL) BY MOUTH 2 (TWO) TIMES DAILY. 10/17/16   York Spaniel, MD  ENABLEX 7.5 MG 24 hr tablet Take 1 tablet by mouth daily. 06/18/14   [provider]  esomeprazole (NEXIUM) 40 MG capsule TAKE 1 CAPSULE (40 MG TOTAL) BY MOUTH DAILY AT 12 NOON. 09/07/17   Lewayne Bunting, MD   furosemide (LASIX) 20 MG tablet ONE TABLET DAILY AS NEEDED FOR SWELLING 07/09/17   Lewayne Bunting, MD  gabapentin (NEURONTIN) 100 MG capsule Take 1 capsule (100 mg total) by mouth 3 (three) times daily. 02/29/16   York Spaniel, MD  HYDROcodone-acetaminophen (NORCO/VICODIN) 5-325 MG tablet Take 1 tablet by mouth every 6 (six) hours as needed for severe pain. 09/14/17   Ward, Chase Picket, PA-C  losartan-hydrochlorothiazide (HYZAAR) 100-12.5 MG tablet Take 1 tablet by mouth daily. 05/30/17   Lewayne Bunting, MD  methocarbamol (ROBAXIN) 500 MG tablet Take 1 tablet (500 mg total) by mouth every 8 (eight) hours as needed for muscle spasms. 09/14/17   Ward, Chase Picket, PA-C  methylphenidate (RITALIN) 20 MG tablet Take 1 tablet (20 mg total) by mouth daily. At noon 09/07/17   York Spaniel, MD  metoprolol succinate (TOPROL-XL) 50 MG 24 hr tablet Take 1 tablet (50 mg total) by mouth daily. 05/30/17   Lewayne Bunting, MD  modafinil (PROVIGIL) 200 MG tablet Take 1 tablet (200 mg total) by mouth daily. 07/31/17   York Spaniel, MD  naproxen (NAPROSYN) 500 MG tablet Take 1 tablet (500 mg total) by mouth 2 (two) times daily as needed for moderate pain. 09/14/17   Ward, Chase Picket, PA-C  NASONEX 50 MCG/ACT nasal spray Place 50 sprays into the nose daily. 09/16/12   [provider]  natalizumab (TYSABRI) 300 MG/15ML injection Inject into the vein.    [provider]    Family History Family History  Problem Relation Age of Onset  . Melanoma Mother   . Colon polyps Mother   . Diabetes Mother   . Breast cancer Maternal Grandmother   . Colon cancer Maternal Grandmother   . Brain cancer Maternal Grandfather   . Lung cancer Maternal Grandfather   . Diabetes Maternal Grandfather   . Kidney disease Neg Hx   . Esophageal cancer Neg Hx   . Gallbladder disease Neg Hx     Social History Social History   Tobacco Use  . Smoking status: Current Every Day Smoker    Packs/day:  0.50    Years: 35.00    Pack years: 17.50    Types: Cigarettes    Last attempt to quit: 06/30/2014    Years since quitting: 3.3  . Smokeless tobacco: Never Used  Substance Use Topics  . Alcohol use: No    Alcohol/week: 0.0 oz  . Drug use: No     Allergies   Demerol [meperidine]; Erythromycin; Morphine and related; Sulfa antibiotics; Diflucan [fluconazole]; Other; Sulfacetamide sodium; Erythromycin base; Fesoterodine; Tape; and Toviaz [fesoterodine fumarate er]   Review of Systems Review of Systems  All other systems reviewed and are negative.    Physical Exam Updated Vital Signs BP 140/84 (BP Location: Right Arm)   Pulse 89   Temp 97.8 F (36.6 C) (Oral)   Resp 18   Ht 5\' 1"  (1.549 m)   Wt 67.1 kg (148 lb)   SpO2 99%   BMI 27.96 kg/m   Physical Exam  Constitutional: She is oriented to person, place, and time. She appears well-developed and well-nourished. No distress.  HENT:  Head: Normocephalic and atraumatic.  Mouth/Throat: Oropharynx is clear and moist.  Eyes: Pupils are equal, round, and reactive to light. Conjunctivae and EOM are normal.  Neck: Normal range of motion. Neck supple.  Cardiovascular: Normal rate, regular rhythm and intact distal pulses.  No murmur heard. Pulmonary/Chest: Effort normal and breath sounds normal. No respiratory distress. She has no wheezes. She has no rales.  Abdominal: Soft. She exhibits no distension. There is no tenderness. There is no rebound and no guarding.  Musculoskeletal: Normal range of motion. She exhibits no edema.       Right hip: Normal.       Left hip: Normal.       Right knee: She exhibits ecchymosis. She exhibits normal range of motion, no swelling and no effusion. Tenderness found.       Left knee: Normal.       Left ankle: Normal.       Legs:      Feet:  Neurological: She is alert and oriented to person, place, and time.  Skin: Skin is warm and dry. No rash noted. No erythema.  Psychiatric: She has a normal  mood and affect.  Her behavior is normal.  Nursing note and vitals reviewed.    ED Treatments / Results  Labs (all labs ordered are listed, but only abnormal results are displayed) Labs Reviewed - No data to display  EKG None  Radiology Dg Toe 2nd Right  Result Date: 11/02/2017 CLINICAL DATA:  Fall today with second toe laceration EXAM: RIGHT SECOND TOE COMPARISON:  09/23/2015 FINDINGS: Initial encounter. Small fracture of the base of the second toe distal phalanx with mild joint widening on the lateral view. No dislocation. There has been arthrodesis of the PIP joint of the second and third digits. Postoperative first metatarsal. IMPRESSION: Small fracture at the base of the second distal phalanx. The associated DIP joint is widened. Electronically Signed   By: Marnee Spring M.D.   On: 11/02/2017 17:39    Procedures Procedures (including critical care time) LACERATION REPAIR Performed by: [facility] Authorized by: Gwyneth Sprout Consent: Verbal consent obtained. Risks and benefits: risks, benefits and alternatives were discussed Consent given by: patient Patient identity confirmed: provided demographic data Prepped and Draped in normal sterile fashion Wound explored  Laceration Location: Right second toe  Laceration Length: 2 cm  No Foreign Bodies seen or palpated  Anesthesia: Digital block Local anesthetic: lidocaine 2% without epinephrine  Anesthetic total: 3 ml  Irrigation method: syringe Amount of cleaning: standard  Skin closure: 4.0 vicryl  Number of sutures: 9  Technique: simple interrupted  Patient tolerance: Patient tolerated the procedure well with no immediate complications.   Medications Ordered in ED Medications  lidocaine (PF) (XYLOCAINE) 1 % injection 5 mL (has no administration in time range)     Initial Impression / Assessment and Plan / ED Course  I have reviewed the triage vital signs and the nursing notes.  Pertinent labs &  imaging results that were available during my care of the patient were reviewed by me and considered in my medical decision making (see chart for details).     Pt with mechanical fall today when her legs gave out when she became overheated.  Patient had no loss of consciousness or symptoms consistent with syncope.  She is awake alert and at baseline.  She does have deep abrasions to the right knee that just needs local wound care.  Low suspicion for fracture of the knee or tib-fib.  Right ankle is normal but the right second toe with deep laceration just proximal to the nailbed.  We will do imaging to ensure no fracture.  Repair as above.  Tetanus shot updated today.   Wound repair as above.  X-ray shows a small fracture at the base of the second distal phalanx.  This is also where the laceration is so we will assume an open fracture.  Patient was started on Keflex.  Final Clinical Impressions(s) / ED Diagnoses   Final diagnoses:  Fall, initial encounter  Abrasion  Displaced fracture of distal phalanx of left lesser toe(s), initial encounter for open fracture  Toe laceration with complication    ED Discharge Orders        Ordered    cephALEXin (KEFLEX) 500 MG capsule  4 times daily     11/02/17 1834       Gwyneth Sprout, MD 11/02/17 Paulo Fruit

## 2017-11-02 NOTE — ED Triage Notes (Addendum)
Reports fall down concrete hill.  Denies head injury.  Lacerations to both legs and injury to second right toe.  Reports history of MS and states she knew she needed to sit down but passed out before being able to.

## 2017-11-05 ENCOUNTER — Ambulatory Visit (INDEPENDENT_AMBULATORY_CARE_PROVIDER_SITE_OTHER): Payer: Medicare Other

## 2017-11-05 ENCOUNTER — Ambulatory Visit: Payer: Medicare Other

## 2017-11-05 ENCOUNTER — Ambulatory Visit (INDEPENDENT_AMBULATORY_CARE_PROVIDER_SITE_OTHER): Payer: Medicare Other | Admitting: Podiatry

## 2017-11-05 ENCOUNTER — Encounter: Payer: Self-pay | Admitting: Podiatry

## 2017-11-05 ENCOUNTER — Other Ambulatory Visit: Payer: Self-pay | Admitting: Podiatry

## 2017-11-05 DIAGNOSIS — G8929 Other chronic pain: Secondary | ICD-10-CM

## 2017-11-05 DIAGNOSIS — M79674 Pain in right toe(s): Secondary | ICD-10-CM

## 2017-11-05 DIAGNOSIS — R2689 Other abnormalities of gait and mobility: Secondary | ICD-10-CM

## 2017-11-05 DIAGNOSIS — G35 Multiple sclerosis: Secondary | ICD-10-CM

## 2017-11-05 DIAGNOSIS — M79675 Pain in left toe(s): Secondary | ICD-10-CM | POA: Diagnosis not present

## 2017-11-06 NOTE — Progress Notes (Signed)
Subjective:   Patient ID: Summer Henderson, female   DOB: 57 y.o.   MRN: [id number]   HPI Patient presents stating that her second and third toes in the right foot were traumatized and she is been having numerous balance issues with her MS and she has had several falls and just fell recently and traumatized her foot and her knee   ROS      Objective:  Physical Exam  Patient with long-standing MS which is progressing with structural damage to the second and third digit right of the left foot with stitches right second digit with balance issues and instability of the ankle     Assessment:  Long-term MS patient who is developing increasing instability issues with trauma to the left and right foot     Plan:  H&P conditions reviewed and at this point discussed her x-rays with her.  I then saw her with ped orthotist and were both in agreement she would benefit from balance bracing due to her increased episodes of falling over the last several years.  She is scheduled for consult consultation and bracing with ped orthotist  X-rays were negative for signs of fracture with good digital alignment secondary to previous foot surgery

## 2017-11-13 ENCOUNTER — Other Ambulatory Visit: Payer: Medicare Other | Admitting: Orthotics

## 2017-12-11 ENCOUNTER — Telehealth: Payer: Self-pay | Admitting: *Deleted

## 2017-12-11 DIAGNOSIS — G35 Multiple sclerosis: Secondary | ICD-10-CM

## 2017-12-11 MED ORDER — METHYLPHENIDATE HCL 20 MG PO TABS: 20.0000 mg | ORAL_TABLET | Freq: Every day | ORAL | 0 refills | Status: DC

## 2017-12-11 NOTE — Telephone Encounter (Signed)
Patient is scheduled to see Summer Henderson on November 21 but would like to see Dr. Anne Hahn that day instead.   Would like to be seen after 11AM that day if possible?  Please call.

## 2017-12-11 NOTE — Telephone Encounter (Signed)
Summer Henderson- yes you can offer her 12pm with Dr. Anne Hahn that day instead

## 2017-12-11 NOTE — Telephone Encounter (Signed)
Patient is in the office for an infusion today and is requesting an RX for her Ritalin.  Would like to be able to take it when she leaves today.

## 2017-12-22 ENCOUNTER — Other Ambulatory Visit: Payer: Self-pay | Admitting: Neurology

## 2017-12-24 ENCOUNTER — Telehealth: Payer: Self-pay | Admitting: Neurology

## 2017-12-24 MED ORDER — NATALIZUMAB 300 MG/15ML IV CONC: INTRAVENOUS | 5 refills | Status: DC

## 2017-12-24 NOTE — Addendum Note (Signed)
Addended by: Hillis Range on: 12/24/2017 01:42 PM   Modules accepted: Orders

## 2017-12-24 NOTE — Telephone Encounter (Signed)
I called [facility] back and spoke w/ Kennyth Arnold (pharmacist) and also gave VO for rx. They are aware we also e-scribed. Nothing further needed.

## 2017-12-24 NOTE — Telephone Encounter (Signed)
Spoke w/ tina in [facility]. Normally they call in VO for refills. Advised we received refill request earlier. We will send in refill to [facility] for them. She will place note in pt chart with [facility] so they are aware.

## 2017-12-24 NOTE — Telephone Encounter (Signed)
[facility] [phone] has called stating they are not in agreement with what Dr Anne Hahn said, Summer Henderson is asking for a call back at [phone] when calling back Summer Henderson states to ask for any pharmacist

## 2018-02-05 ENCOUNTER — Other Ambulatory Visit: Payer: Self-pay | Admitting: *Deleted

## 2018-02-05 DIAGNOSIS — G35 Multiple sclerosis: Secondary | ICD-10-CM

## 2018-02-05 MED ORDER — METHYLPHENIDATE HCL 20 MG PO TABS: 20.0000 mg | ORAL_TABLET | Freq: Every day | ORAL | 0 refills | Status: DC

## 2018-02-19 ENCOUNTER — Telehealth: Payer: Self-pay | Admitting: *Deleted

## 2018-02-19 NOTE — Telephone Encounter (Signed)
Received fax notification from [employer] prescribing program that pt authorized from 02/19/18-09/21/18.  Pt enrollment number: [id number]. Site Auth number: [id number].

## 2018-03-06 ENCOUNTER — Telehealth: Payer: Self-pay | Admitting: Neurology

## 2018-03-06 ENCOUNTER — Other Ambulatory Visit: Payer: Self-pay | Admitting: Neurology

## 2018-03-06 ENCOUNTER — Telehealth: Payer: Self-pay | Admitting: *Deleted

## 2018-03-06 DIAGNOSIS — G35 Multiple sclerosis: Secondary | ICD-10-CM

## 2018-03-06 MED ORDER — DULOXETINE HCL 60 MG PO CPEP: ORAL_CAPSULE | ORAL | 1 refills | Status: DC

## 2018-03-06 MED ORDER — METHYLPHENIDATE HCL 20 MG PO TABS: 20.0000 mg | ORAL_TABLET | Freq: Every day | ORAL | 0 refills | Status: DC

## 2018-03-06 NOTE — Telephone Encounter (Signed)
We received a request to r/f pt's Cymbalta, however the last time we r/f this was in June 2018, for a 90 day rx. plus one r/f.  If she is taking as rx'd, she should have been out of med in Dec. 2018. I spoke with pt. to confirm how she takes Cymbalta. She sts. she takes it most days in the am, taking for neuropathic pain, so does not need to be a scheduled med. Her last refill was in May 2019. Refill escribed to pharmacy/fim

## 2018-03-06 NOTE — Telephone Encounter (Signed)
Pt has called for a refill on her methylphenidate (RITALIN) 20 MG tablet [id number] [place], [address]

## 2018-03-06 NOTE — Telephone Encounter (Signed)
LMTC. We received a request to r/f Cymbalta, but the last time we r/f this for her was in June of 2018, so she should have been out of it for the last 9-10 mos.  Will ask her how she is taking this/fim

## 2018-03-06 NOTE — Addendum Note (Signed)
Addended by: York Spaniel on: 03/06/2018 01:22 PM   Modules accepted: Orders

## 2018-03-06 NOTE — Telephone Encounter (Signed)
The prescription for Ritalin was sent in. 

## 2018-03-13 ENCOUNTER — Telehealth: Payer: Self-pay | Admitting: *Deleted

## 2018-03-13 MED ORDER — HYDROCHLOROTHIAZIDE 12.5 MG PO CAPS: 12.5000 mg | ORAL_CAPSULE | Freq: Every day | ORAL | 0 refills | Status: DC

## 2018-03-13 MED ORDER — LOSARTAN POTASSIUM 100 MG PO TABS: 100.0000 mg | ORAL_TABLET | Freq: Every day | ORAL | 0 refills | Status: DC

## 2018-03-13 NOTE — Telephone Encounter (Signed)
Ok to split into separate prescriptions Summer Henderson

## 2018-03-13 NOTE — Telephone Encounter (Signed)
prescriptions sent to pharmacy: Losartan 100 mg daily HCTZ 12.5 mg daily

## 2018-03-13 NOTE — Telephone Encounter (Signed)
Pharmacy is requesting for Losartan-HCTZ to be split into separate prescriptions due to 'Indefinite Backorder'.

## 2018-03-28 ENCOUNTER — Other Ambulatory Visit: Payer: Self-pay

## 2018-03-28 ENCOUNTER — Encounter: Payer: Self-pay | Admitting: Neurology

## 2018-03-28 ENCOUNTER — Ambulatory Visit (INDEPENDENT_AMBULATORY_CARE_PROVIDER_SITE_OTHER): Payer: Medicare Other | Admitting: Neurology

## 2018-03-28 ENCOUNTER — Ambulatory Visit: Payer: Medicare Other | Admitting: Adult Health

## 2018-03-28 ENCOUNTER — Ambulatory Visit: Payer: Medicare Other | Admitting: Nurse Practitioner

## 2018-03-28 VITALS — BP 128/82 | HR 79 | Resp 16 | Ht 61.0 in | Wt 150.0 lb

## 2018-03-28 DIAGNOSIS — D72829 Elevated white blood cell count, unspecified: Secondary | ICD-10-CM

## 2018-03-28 DIAGNOSIS — G35 Multiple sclerosis: Secondary | ICD-10-CM | POA: Diagnosis not present

## 2018-03-28 DIAGNOSIS — Z5181 Encounter for therapeutic drug level monitoring: Secondary | ICD-10-CM | POA: Diagnosis not present

## 2018-03-28 NOTE — Progress Notes (Signed)
Reason for visit: Multiple sclerosis  Summer Henderson is an 57 y.o. female  History of present illness:  Summer Henderson is a 57 year old right-handed white female with a history of multiple sclerosis.  The patient is on Tysabri, she is tolerating the medication quite well.  She had a fall requiring an emergency room visit on 02 November 2017.  The patient had been walking outside in the heat and then fell.  The patient has had no further falls.  She does have a baseline problem with gait instability.  She reports no new symptoms of numbness, weakness, vision changes or bladder control changes.  The patient had MRI of the brain done in May 2019 with good stability on the study.  The patient returns to the office today for an evaluation.  She continues to complain of generalized fatigue.  Past Medical History:  Diagnosis Date  . Anxiety   . Arthritis   . CAD (coronary artery disease)    a. 06/2014 abnl MV;  b. 06/2014 Cath: LM nl, LAD 33m (2.75x28 Promus DES), LCX nl, RCA 60-83m (FFR = 0.80 -> 3.0x24 Promus DES), EF 55-65%.  . Carpal tunnel syndrome   . Cervical dysplasia   . Chronic fatigue   . Depression   . GERD (gastroesophageal reflux disease)   . Hypertension   . Mitral valve prolapse   . Multiple sclerosis (HCC)   . Pneumonia   . Prolonged QT interval   . RLS (restless legs syndrome) 04/28/2015    Past Surgical History:  Procedure Laterality Date  . ABDOMINAL HYSTERECTOMY    . BICEPS TENDON REPAIR    . bladder resuspension    . CARPAL TUNNEL RELEASE    . CERVICAL SPINE SURGERY  10-16-2009  . CORONARY ANGIOPLASTY WITH STENT PLACEMENT    . DILATION AND CURETTAGE OF UTERUS    . FOOT SURGERY Bilateral    For bunions  . HAMMER TOE SURGERY Left   . LEFT HEART CATHETERIZATION WITH CORONARY ANGIOGRAM N/A 06/29/2014   Procedure: LEFT HEART CATHETERIZATION WITH CORONARY ANGIOGRAM;  Surgeon: Micheline Chapman, MD;  Location: [facility] CATH LAB;  Service: Cardiovascular;  Laterality: N/A;  . ORIF TOE  FRACTURE Right   . Prolong QTC     Heart  . rectovaginal fistula repair      Family History  Problem Relation Age of Onset  . Melanoma Mother   . Colon polyps Mother   . Diabetes Mother   . Breast cancer Maternal Grandmother   . Colon cancer Maternal Grandmother   . Brain cancer Maternal Grandfather   . Lung cancer Maternal Grandfather   . Diabetes Maternal Grandfather   . Kidney disease Neg Hx   . Esophageal cancer Neg Hx   . Gallbladder disease Neg Hx     Social history:  reports that she has been smoking cigarettes. She has a 17.50 pack-year smoking history. She has never used smokeless tobacco. She reports that she does not drink alcohol or use drugs.    Allergies  Allergen Reactions  . Demerol [Meperidine] Other (See Comments)    [derm] rash, swelling, itching Other reaction(s): Other (See Comments) [derm] rash, swelling, itching  . Erythromycin   . Morphine And Related   . Sulfa Antibiotics   . Diflucan [Fluconazole] Other (See Comments)    States she has prolonged QT and cannot take this.  . Other     Other reaction(s): Other (See Comments) Z-pak causes EKG changes, was told by cardiologist not  to take this.  . Sulfacetamide Sodium   . Erythromycin Base Rash    [Derm] rash  . Fesoterodine Palpitations  . Tape Other (See Comments)    Adhesive tape And Steri Strips; Local reaction-burns skin  . Toviaz [Fesoterodine Fumarate Er] Palpitations    Medications:  Prior to Admission medications   Medication Sig Start Date End Date Taking? Authorizing Provider  ALPRAZolam Prudy Feeler) 0.5 MG tablet Take 2 tablets approximately 45 minutes prior to the MRI study, take a third tablet if needed. 09/13/17   York Spaniel, MD  aspirin 81 MG tablet Take 1 tablet (81 mg total) by mouth daily. 07/24/14   Lewayne Bunting, MD  atorvastatin (LIPITOR) 40 MG tablet Take 1 tablet (40 mg total) by mouth daily at 6 PM. 05/30/17   Crenshaw, Madolyn Frieze, MD  cephALEXin (KEFLEX) 500 MG  capsule Take 1 capsule (500 mg total) by mouth 4 (four) times daily. 11/02/17   Gwyneth Sprout, MD  DULoxetine (CYMBALTA) 60 MG capsule TAKE 1 CAPSULE (60 MG TOTAL) BY MOUTH 2 (TWO) TIMES DAILY. 03/06/18   York Spaniel, MD  ENABLEX 7.5 MG 24 hr tablet Take 1 tablet by mouth daily. 06/18/14   [provider]  esomeprazole (NEXIUM) 40 MG capsule TAKE 1 CAPSULE (40 MG TOTAL) BY MOUTH DAILY AT 12 NOON. 09/07/17   Lewayne Bunting, MD  furosemide (LASIX) 20 MG tablet ONE TABLET DAILY AS NEEDED FOR SWELLING 07/09/17   Lewayne Bunting, MD  gabapentin (NEURONTIN) 100 MG capsule Take 1 capsule (100 mg total) by mouth 3 (three) times daily. 02/29/16   York Spaniel, MD  hydrochlorothiazide (MICROZIDE) 12.5 MG capsule Take 1 capsule (12.5 mg total) by mouth daily. 03/13/18 06/11/18  Lewayne Bunting, MD  HYDROcodone-acetaminophen (NORCO/VICODIN) 5-325 MG tablet Take 1 tablet by mouth every 6 (six) hours as needed for severe pain. 09/14/17   Ward, Chase Picket, PA-C  losartan (COZAAR) 100 MG tablet Take 1 tablet (100 mg total) by mouth daily. 03/13/18 06/11/18  Lewayne Bunting, MD  methocarbamol (ROBAXIN) 500 MG tablet Take 1 tablet (500 mg total) by mouth every 8 (eight) hours as needed for muscle spasms. 09/14/17   Ward, Chase Picket, PA-C  methylphenidate (RITALIN) 20 MG tablet Take 1 tablet (20 mg total) by mouth daily. At noon 03/06/18   York Spaniel, MD  metoprolol succinate (TOPROL-XL) 50 MG 24 hr tablet Take 1 tablet (50 mg total) by mouth daily. 05/30/17   Lewayne Bunting, MD  modafinil (PROVIGIL) 200 MG tablet Take 1 tablet (200 mg total) by mouth daily. 07/31/17   York Spaniel, MD  naproxen (NAPROSYN) 500 MG tablet Take 1 tablet (500 mg total) by mouth 2 (two) times daily as needed for moderate pain. 09/14/17   Ward, Chase Picket, PA-C  NASONEX 50 MCG/ACT nasal spray Place 50 sprays into the nose daily. 09/16/12   [provider]  natalizumab (TYSABRI) 300 MG/15ML  injection Infuse 300mg  IV every 4 weeks as directed. (given at MD's office by intrafusion, discard after 1st use) 12/24/17   York Spaniel, MD    ROS:  Out of a complete 14 system review of symptoms, the patient complains only of the following symptoms, and all other reviewed systems are negative.  Fatigue Constipation Restless legs, snoring Incontinence of the bladder, frequency of urination, urinary urgency Bruising easily Weakness, tremors Depression anxiety  Blood pressure 128/82, pulse 79, resp. rate 16, height 5\' 1"  (1.549 m), weight 150  lb (68 kg).  Physical Exam  General: The patient is alert and cooperative at the time of the examination.  Skin: No significant peripheral edema is noted.   Neurologic Exam  Mental status: The patient is alert and oriented x 3 at the time of the examination. The patient has apparent normal recent and remote memory, with an apparently normal attention span and concentration ability.   Cranial nerves: Facial symmetry is present. Speech is normal, no aphasia or dysarthria is noted. Extraocular movements are full. Visual fields are full.  Pupils are equal, round, and reactive to light.  Discs are flat bilaterally.  Motor: The patient has good strength in all 4 extremities.  Sensory examination: Soft touch sensation is symmetric on the face, arms, and legs.  Coordination: The patient has good finger-nose-finger and heel-to-shin bilaterally.  Gait and station: The patient has a wide-based, unsteady gait.  Tandem gait is unsteady.  Romberg is positive, the patient goes backwards. No drift is seen.  Reflexes: Deep tendon reflexes are symmetric.   MRI brain 09/22/17:  IMPRESSION: Multiple sclerosis without visible progression. Stable, normal brain Volume.  * MRI scan images were reviewed online. I agree with the written report.    Assessment/Plan:  1.  Multiple sclerosis  2.  Chronic fatigue  3.  Gait disturbance  4.   Neurogenic bladder  The patient will continue Tysabri for now, she will get blood work done today.  She will follow-up in 6 months, we will repeat MRI of the brain at that time.  Marlan Palau MD 03/28/2018 12:09 PM  [facility] [address] [place], [place] [address]  Phone [phone] Fax [phone]

## 2018-03-29 ENCOUNTER — Telehealth: Payer: Self-pay | Admitting: Neurology

## 2018-03-29 DIAGNOSIS — Z5181 Encounter for therapeutic drug level monitoring: Secondary | ICD-10-CM

## 2018-03-29 DIAGNOSIS — D72829 Elevated white blood cell count, unspecified: Secondary | ICD-10-CM

## 2018-03-29 LAB — CBC WITH DIFFERENTIAL/PLATELET
BASOS: 1 %
Basophils Absolute: 0.1 10*3/uL (ref 0.0–0.2)
EOS (ABSOLUTE): 0.2 10*3/uL (ref 0.0–0.4)
EOS: 1 %
HEMATOCRIT: 40.4 % (ref 34.0–46.6)
HEMOGLOBIN: 13.6 g/dL (ref 11.1–15.9)
IMMATURE GRANS (ABS): 0 10*3/uL (ref 0.0–0.1)
IMMATURE GRANULOCYTES: 0 %
LYMPHS: 37 %
Lymphocytes Absolute: 5.2 10*3/uL — ABNORMAL HIGH (ref 0.7–3.1)
MCH: 28.8 pg (ref 26.6–33.0)
MCHC: 33.7 g/dL (ref 31.5–35.7)
MCV: 86 fL (ref 79–97)
MONOCYTES: 6 %
Monocytes Absolute: 0.8 10*3/uL (ref 0.1–0.9)
NEUTROS PCT: 55 %
NRBC: 1 % — ABNORMAL HIGH (ref 0–0)
Neutrophils Absolute: 7.6 10*3/uL — ABNORMAL HIGH (ref 1.4–7.0)
Platelets: 443 10*3/uL (ref 150–450)
RBC: 4.72 x10E6/uL (ref 3.77–5.28)
RDW: 15.3 % (ref 12.3–15.4)
WBC: 14 10*3/uL — AB (ref 3.4–10.8)

## 2018-03-29 LAB — COMPREHENSIVE METABOLIC PANEL
ALT: 16 IU/L (ref 0–32)
AST: 15 IU/L (ref 0–40)
Albumin/Globulin Ratio: 1.8 (ref 1.2–2.2)
Albumin: 4.4 g/dL (ref 3.5–5.5)
Alkaline Phosphatase: 137 IU/L — ABNORMAL HIGH (ref 39–117)
BUN/Creatinine Ratio: 9 (ref 9–23)
BUN: 7 mg/dL (ref 6–24)
Bilirubin Total: 0.3 mg/dL (ref 0.0–1.2)
CALCIUM: 9.4 mg/dL (ref 8.7–10.2)
CO2: 22 mmol/L (ref 20–29)
Chloride: 96 mmol/L (ref 96–106)
Creatinine, Ser: 0.74 mg/dL (ref 0.57–1.00)
GFR calc Af Amer: 105 mL/min/{1.73_m2} (ref >59)
GFR, EST NON AFRICAN AMERICAN: 91 mL/min/{1.73_m2} (ref >59)
GLOBULIN, TOTAL: 2.4 g/dL (ref 1.5–4.5)
Glucose: 76 mg/dL (ref 65–99)
Potassium: 3.5 mmol/L (ref 3.5–5.2)
Sodium: 135 mmol/L (ref 134–144)
Total Protein: 6.8 g/dL (ref 6.0–8.5)

## 2018-03-29 NOTE — Telephone Encounter (Signed)
I called the patient.  The patient had blood work done, the blood work shows elevation in the white blood count, etiology is not clear.  The patient is not running fevers, she has no dysuria, no cough or sinus drainage, no night sweats.  The patient has not been on steroids recently.   I will recheck the white blood count next week when she comes in for the Tysabri, we will check a urinalysis at that same time.

## 2018-04-02 ENCOUNTER — Other Ambulatory Visit: Payer: Self-pay | Admitting: *Deleted

## 2018-04-02 DIAGNOSIS — G35 Multiple sclerosis: Secondary | ICD-10-CM

## 2018-04-02 DIAGNOSIS — Z79899 Other long term (current) drug therapy: Secondary | ICD-10-CM

## 2018-04-02 NOTE — Addendum Note (Signed)
Addended by: Tamera Stands D on: 04/02/2018 01:27 PM   Modules accepted: Orders

## 2018-04-03 ENCOUNTER — Telehealth: Payer: Self-pay | Admitting: Neurology

## 2018-04-03 LAB — URINALYSIS, ROUTINE W REFLEX MICROSCOPIC
Bilirubin, UA: NEGATIVE
Glucose, UA: NEGATIVE
KETONES UA: NEGATIVE
NITRITE UA: NEGATIVE
PH UA: 6 (ref 5.0–7.5)
Protein, UA: NEGATIVE
RBC, UA: NEGATIVE
SPEC GRAV UA: 1.008 (ref 1.005–1.030)
Urobilinogen, Ur: 0.2 mg/dL (ref 0.2–1.0)

## 2018-04-03 LAB — CBC WITH DIFFERENTIAL/PLATELET
BASOS: 1 %
Basophils Absolute: 0.1 10*3/uL (ref 0.0–0.2)
EOS (ABSOLUTE): 0.3 10*3/uL (ref 0.0–0.4)
EOS: 3 %
HEMATOCRIT: 39 % (ref 34.0–46.6)
HEMOGLOBIN: 13.1 g/dL (ref 11.1–15.9)
Immature Grans (Abs): 0 10*3/uL (ref 0.0–0.1)
Immature Granulocytes: 0 %
LYMPHS ABS: 5.5 10*3/uL — AB (ref 0.7–3.1)
Lymphs: 44 %
MCH: 29.3 pg (ref 26.6–33.0)
MCHC: 33.6 g/dL (ref 31.5–35.7)
MCV: 87 fL (ref 79–97)
MONOS ABS: 0.8 10*3/uL (ref 0.1–0.9)
Monocytes: 6 %
NEUTROS ABS: 5.8 10*3/uL (ref 1.4–7.0)
NRBC: 1 % — ABNORMAL HIGH (ref 0–0)
Neutrophils: 46 %
Platelets: 422 10*3/uL (ref 150–450)
RBC: 4.47 x10E6/uL (ref 3.77–5.28)
RDW: 15.5 % — ABNORMAL HIGH (ref 12.3–15.4)
WBC: 12.6 10*3/uL — ABNORMAL HIGH (ref 3.4–10.8)

## 2018-04-03 LAB — MICROSCOPIC EXAMINATION
Bacteria, UA: NONE SEEN
CASTS: NONE SEEN /LPF
EPITHELIAL CELLS (NON RENAL): NONE SEEN /HPF (ref 0–10)
WBC, UA: NONE SEEN /hpf (ref 0–5)

## 2018-04-03 NOTE — Telephone Encounter (Signed)
I called the patient.  The white blood count still elevated but it is decreasing gradually over time.  Urinalysis is negative, the patient herself feels well, no fevers, chills, night sweats.

## 2018-04-10 ENCOUNTER — Telehealth: Payer: Self-pay | Admitting: Neurology

## 2018-04-10 ENCOUNTER — Telehealth: Payer: Self-pay | Admitting: *Deleted

## 2018-04-10 NOTE — Telephone Encounter (Signed)
JCV ab collected 04/02/18 is Positive at 4.24/fim

## 2018-04-10 NOTE — Telephone Encounter (Signed)
I called the patient.  The JC viral antibody panel is now positive at high titer, 4.24.  We will stop that the Tysabri, consider switch over to Ocrevus, I will get a revisit to go over with the patient the potential side effects of this medication.  We may need further blood work as well.

## 2018-04-10 NOTE — Telephone Encounter (Signed)
Spoke with pt. and offered appt. tomorrow 12N; she declined, stating she has another appt. tomorrow. Appt. made 04/24/18, arrival time of 7am for an 0730 appt/fim

## 2018-04-16 ENCOUNTER — Encounter: Payer: Self-pay | Admitting: Neurology

## 2018-04-16 ENCOUNTER — Ambulatory Visit (INDEPENDENT_AMBULATORY_CARE_PROVIDER_SITE_OTHER): Payer: Medicare Other | Admitting: Neurology

## 2018-04-16 VITALS — BP 124/81 | HR 78 | Ht 61.0 in | Wt 154.0 lb

## 2018-04-16 DIAGNOSIS — G35 Multiple sclerosis: Secondary | ICD-10-CM

## 2018-04-16 DIAGNOSIS — Z5181 Encounter for therapeutic drug level monitoring: Secondary | ICD-10-CM

## 2018-04-16 MED ORDER — METHYLPHENIDATE HCL 20 MG PO TABS: 20.0000 mg | ORAL_TABLET | Freq: Every day | ORAL | 0 refills | Status: DC

## 2018-04-16 NOTE — Progress Notes (Signed)
Reason for visit: Multiple sclerosis  Summer Henderson is an 57 y.o. female  History of present illness:  Ms. Summer Henderson is a 57 year old right-handed white female with a history of multiple sclerosis.  The patient has been on Tysabri which she tolerated quite well, her most recent blood test for the JC viral antibody was positive with a very high titer of 4.24.  The patient comes back in the office today to discuss the possibility of going on another medication for her multiple sclerosis.  The patient has had recent blood work showing a slightly high white blood count, a repeat study showed that the white blood count was still elevated but improving.  The patient returns for an evaluation.  Past Medical History:  Diagnosis Date  . Anxiety   . Arthritis   . CAD (coronary artery disease)    a. 06/2014 abnl MV;  b. 06/2014 Cath: LM nl, LAD 57m (2.75x28 Promus DES), LCX nl, RCA 60-86m (FFR = 0.80 -> 3.0x24 Promus DES), EF 55-65%.  . Carpal tunnel syndrome   . Cervical dysplasia   . Chronic fatigue   . Depression   . GERD (gastroesophageal reflux disease)   . Hypertension   . Mitral valve prolapse   . Multiple sclerosis (HCC)   . Pneumonia   . Prolonged QT interval   . RLS (restless legs syndrome) 04/28/2015    Past Surgical History:  Procedure Laterality Date  . ABDOMINAL HYSTERECTOMY    . BICEPS TENDON REPAIR    . bladder resuspension    . CARPAL TUNNEL RELEASE    . CERVICAL SPINE SURGERY  10-16-2009  . CORONARY ANGIOPLASTY WITH STENT PLACEMENT    . DILATION AND CURETTAGE OF UTERUS    . FOOT SURGERY Bilateral    For bunions  . HAMMER TOE SURGERY Left   . LEFT HEART CATHETERIZATION WITH CORONARY ANGIOGRAM N/A 06/29/2014   Procedure: LEFT HEART CATHETERIZATION WITH CORONARY ANGIOGRAM;  Surgeon: Micheline Chapman, MD;  Location: [facility] CATH LAB;  Service: Cardiovascular;  Laterality: N/A;  . ORIF TOE FRACTURE Right   . Prolong QTC     Heart  . rectovaginal fistula repair      Family  History  Problem Relation Age of Onset  . Melanoma Mother   . Colon polyps Mother   . Diabetes Mother   . Breast cancer Maternal Grandmother   . Colon cancer Maternal Grandmother   . Brain cancer Maternal Grandfather   . Lung cancer Maternal Grandfather   . Diabetes Maternal Grandfather   . Kidney disease Neg Hx   . Esophageal cancer Neg Hx   . Gallbladder disease Neg Hx     Social history:  reports that she has been smoking cigarettes. She has a 17.50 pack-year smoking history. She has never used smokeless tobacco. She reports that she does not drink alcohol or use drugs.    Allergies  Allergen Reactions  . Demerol [Meperidine] Other (See Comments)    [derm] rash, swelling, itching Other reaction(s): Other (See Comments) [derm] rash, swelling, itching  . Erythromycin   . Morphine And Related   . Sulfa Antibiotics   . Diflucan [Fluconazole] Other (See Comments)    States she has prolonged QT and cannot take this.  . Other     Other reaction(s): Other (See Comments) Z-pak causes EKG changes, was told by cardiologist not to take this.  . Sulfacetamide Sodium   . Erythromycin Base Rash    [Derm] rash  . Fesoterodine  Palpitations  . Tape Other (See Comments)    Adhesive tape And Steri Strips; Local reaction-burns skin  . Toviaz [Fesoterodine Fumarate Er] Palpitations    Medications:  Prior to Admission medications   Medication Sig Start Date End Date Taking? Authorizing Provider  ALPRAZolam Prudy Feeler) 0.5 MG tablet Take 2 tablets approximately 45 minutes prior to the MRI study, take a third tablet if needed. 09/13/17  Yes York Spaniel, MD  aspirin 81 MG tablet Take 1 tablet (81 mg total) by mouth daily. 07/24/14  Yes Lewayne Bunting, MD  atorvastatin (LIPITOR) 40 MG tablet Take 1 tablet (40 mg total) by mouth daily at 6 PM. 05/30/17  Yes Crenshaw, Madolyn Frieze, MD  DULoxetine (CYMBALTA) 60 MG capsule TAKE 1 CAPSULE (60 MG TOTAL) BY MOUTH 2 (TWO) TIMES DAILY. 03/06/18  Yes  York Spaniel, MD  ENABLEX 7.5 MG 24 hr tablet Take 1 tablet by mouth daily. 06/18/14  Yes [provider]  esomeprazole (NEXIUM) 40 MG capsule TAKE 1 CAPSULE (40 MG TOTAL) BY MOUTH DAILY AT 12 NOON. 09/07/17  Yes Crenshaw, Madolyn Frieze, MD  furosemide (LASIX) 20 MG tablet ONE TABLET DAILY AS NEEDED FOR SWELLING 07/09/17  Yes Crenshaw, Madolyn Frieze, MD  gabapentin (NEURONTIN) 100 MG capsule Take 1 capsule (100 mg total) by mouth 3 (three) times daily. 02/29/16  Yes York Spaniel, MD  hydrochlorothiazide (MICROZIDE) 12.5 MG capsule Take 1 capsule (12.5 mg total) by mouth daily. 03/13/18 06/11/18 Yes Lewayne Bunting, MD  HYDROcodone-acetaminophen (NORCO/VICODIN) 5-325 MG tablet Take 1 tablet by mouth every 6 (six) hours as needed for severe pain. 09/14/17  Yes Ward, Chase Picket, PA-C  losartan (COZAAR) 100 MG tablet Take 1 tablet (100 mg total) by mouth daily. 03/13/18 06/11/18 Yes Lewayne Bunting, MD  methocarbamol (ROBAXIN) 500 MG tablet Take 1 tablet (500 mg total) by mouth every 8 (eight) hours as needed for muscle spasms. 09/14/17  Yes Ward, Chase Picket, PA-C  methylphenidate (RITALIN) 20 MG tablet Take 1 tablet (20 mg total) by mouth daily. At noon 03/06/18  Yes York Spaniel, MD  metoprolol succinate (TOPROL-XL) 50 MG 24 hr tablet Take 1 tablet (50 mg total) by mouth daily. 05/30/17  Yes Lewayne Bunting, MD  modafinil (PROVIGIL) 200 MG tablet Take 1 tablet (200 mg total) by mouth daily. 07/31/17  Yes York Spaniel, MD  naproxen (NAPROSYN) 500 MG tablet Take 1 tablet (500 mg total) by mouth 2 (two) times daily as needed for moderate pain. 09/14/17  Yes Ward, Chase Picket, PA-C  NASONEX 50 MCG/ACT nasal spray Place 50 sprays into the nose daily. 09/16/12  Yes [provider]  natalizumab (TYSABRI) 300 MG/15ML injection Infuse 300mg  IV every 4 weeks as directed. (given at MD's office by intrafusion, discard after 1st use) 12/24/17  Yes York Spaniel, MD    ROS:  Out of a  complete 14 system review of symptoms, the patient complains only of the following symptoms, and all other reviewed systems are negative.  Fatigue  Blood pressure 124/81, pulse 78, height 5\' 1"  (1.549 m), weight 154 lb (69.9 kg).  Physical Exam  General: The patient is alert and cooperative at the time of the examination.  Skin: No significant peripheral edema is noted.   Neurologic Exam  Mental status: The patient is alert and oriented x 3 at the time of the examination. The patient has apparent normal recent and remote memory, with an apparently normal attention span and concentration ability.  Cranial nerves: Facial symmetry is present. Speech is normal, no aphasia or dysarthria is noted. Extraocular movements are full. Visual fields are full.  Motor: The patient has good strength in all 4 extremities.  Sensory examination: Soft touch sensation is symmetric on the face, arms, and legs.  Coordination: The patient has good finger-nose-finger and heel-to-shin bilaterally.  Gait and station: The patient has a normal gait. Tandem gait is slightly unsteady. Romberg is negative. No drift is seen.  Reflexes: Deep tendon reflexes are symmetric.   Assessment/Plan:  1.  Multiple sclerosis  The patient will be considered for Ocrevus therapy.  We have discussed this treatment in depth today.  She will have blood work done today, we will try to initiate the medication in mid January 2020.  The patient last had her Tysabri dose on 02 April 2018.  Greater than 50% of the visit was spent in counseling and coordination of care.  Face-to-face time with the patient was 25 minutes.   Marlan Palau MD 04/16/2018 1:38 PM  [facility] [address] [place], [place] [address]  Phone [phone] Fax [phone]

## 2018-04-18 ENCOUNTER — Telehealth: Payer: Self-pay

## 2018-04-18 NOTE — Telephone Encounter (Signed)
Ocrevus start form given to [facility] in intrafusion on 04/17/2018. Note placed on paper work Ocrevus to start mid to late January due to patient being switched from [place] to [place]. MB RN.

## 2018-04-23 LAB — HEPATITIS B SURFACE ANTIBODY,QUALITATIVE: Hep B Surface Ab, Qual: REACTIVE

## 2018-04-23 LAB — HEPATITIS B CORE ANTIBODY, TOTAL: Hep B Core Total Ab: NEGATIVE

## 2018-04-23 LAB — QUANTIFERON-TB GOLD PLUS
QUANTIFERON TB2 AG VALUE: 0.07 [IU]/mL
QUANTIFERON-TB GOLD PLUS: NEGATIVE
QuantiFERON Mitogen Value: >10 IU/mL
QuantiFERON Nil Value: 0.07 IU/mL
QuantiFERON TB1 Ag Value: 0.07 IU/mL

## 2018-04-23 LAB — VARICELLA ZOSTER ANTIBODY, IGG: Varicella zoster IgG: 2371 index (ref >165)

## 2018-04-23 LAB — HEPATITIS C ANTIBODY

## 2018-04-23 NOTE — Telephone Encounter (Signed)
Berneice Heinrich, RN received a e-mail from [place], [place] with intrafusion Requesting patient's Ocrevus start form, insurance card, demographics, office notes and most recent MRI. Documents faxed to [phone] with confirmation received. Note sent to [facility], stating Ocrevus should be started mid to late January, per Dr. Anne Hahn request.  MB RN.

## 2018-04-24 ENCOUNTER — Ambulatory Visit: Payer: Self-pay | Admitting: Neurology

## 2018-04-24 NOTE — Telephone Encounter (Signed)
I contacted Lennox Grumbles with [employer]. [employer] needed to confirm if the patient was d/c the Tysabri. I advised during the last office visit with Dr. Anne Hahn( 04/16/2018) patient would be d/c Tysabri (JC viral antibody was positive with a very high titer of 4.24) and will be started on Ocrevus. Patient's last infusion of Tysabri was 04/02/2018, due to the Dr. Anne Hahn is requesting the first dosage of Ocrevus to be given mid to late January.  Lennox Grumbles voiced understanding and stated she would advise Marcelino Duster with [employer] of this information. MB RN

## 2018-04-24 NOTE — Telephone Encounter (Signed)
Marcelino Duster with [employer] calling to confirm that patient no longer takes Tysabri.

## 2018-05-15 ENCOUNTER — Telehealth: Payer: Self-pay

## 2018-05-15 NOTE — Telephone Encounter (Signed)
Received fax from [employer] for d/c of Tysabri. Dr. Anne Hahn signed d/c form due to JCV antibodies present at last draw result (4.24). MB RN

## 2018-05-17 ENCOUNTER — Other Ambulatory Visit: Payer: Self-pay | Admitting: Neurology

## 2018-05-17 DIAGNOSIS — G35 Multiple sclerosis: Secondary | ICD-10-CM

## 2018-05-17 MED ORDER — METHYLPHENIDATE HCL 20 MG PO TABS: 20.0000 mg | ORAL_TABLET | Freq: Every day | ORAL | 0 refills | Status: DC

## 2018-05-17 NOTE — Addendum Note (Signed)
Addended by: Geronimo Running A on: 05/17/2018 11:27 AM   Modules accepted: Orders

## 2018-05-17 NOTE — Telephone Encounter (Signed)
Pt has called for a refill on methylphenidate (RITALIN) 20 MG tablet [id number] No changes to insurance

## 2018-05-17 NOTE — Telephone Encounter (Signed)
Pt is due for a refill on ritalin and is up to date on her appts. [place] [facility] checked. Will send to Dr. Anne Hahn for review.

## 2018-05-30 ENCOUNTER — Encounter: Payer: Self-pay | Admitting: Cardiology

## 2018-06-10 ENCOUNTER — Other Ambulatory Visit: Payer: Self-pay | Admitting: Cardiology

## 2018-06-10 NOTE — Telephone Encounter (Signed)
Rx request sent to pharmacy.  

## 2018-06-11 NOTE — Progress Notes (Signed)
HPI: FU CAD, hypertension, prolonged QT and mitral valve prolapse. Holter monitor in September of 2012 showed sinus rhythm with rare PVC. Echocardiogram in June of 2015 showed normal LV function and mild left atrial enlargement. There was trace mitral regurgitation. Patient had an abdominal CT for abdominal pain in December 2015. 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 Nuclear study January 2016 showed an ejection fraction of 56%, probable mild breast attenuation with possible mild mid anteroseptal ischemia. Cardiac catheterization February 2016 showed 80% LAD and 60-70% RCA. Ejection fraction 55-65%. The patient had PCI of the LAD and RCA; ultrasound March 2016 showed no DVT. Since she was last seen,the patient has dyspnea with more extreme activities but not with routine activities. It is relieved with rest. It is not associated with chest pain. There is no orthopnea, PND or pedal edema. There is no syncope or palpitations. There is no exertional chest pain.   Current Outpatient Medications  Medication Sig Dispense Refill  . ALPRAZolam (XANAX) 0.5 MG tablet Take 2 tablets approximately 45 minutes prior to the MRI study, take a third tablet if needed. 3 tablet 0  . aspirin 81 MG tablet Take 1 tablet (81 mg total) by mouth daily. 30 tablet   . atorvastatin (LIPITOR) 40 MG tablet Take 1 tablet (40 mg total) by mouth daily at 6 PM. 90 tablet 3  . DULoxetine (CYMBALTA) 60 MG capsule TAKE 1 CAPSULE (60 MG TOTAL) BY MOUTH 2 (TWO) TIMES DAILY. 180 capsule 1  . ENABLEX 7.5 MG 24 hr tablet Take 1 tablet by mouth daily.    Marland Kitchen. esomeprazole (NEXIUM) 40 MG capsule TAKE 1 CAPSULE (40 MG TOTAL) BY MOUTH DAILY AT 12 NOON. 90 capsule 3  . furosemide (LASIX) 20 MG tablet ONE TABLET DAILY AS NEEDED FOR SWELLING 30 tablet 6  . gabapentin (NEURONTIN) 100 MG capsule Take 1 capsule (100 mg total) by mouth 3 (three) times daily. 270 capsule 0  .  hydrochlorothiazide (HYDRODIURIL) 12.5 MG tablet TAKE 1 TABLET BY MOUTH EVERY DAY 90 tablet 1  . HYDROcodone-acetaminophen (NORCO/VICODIN) 5-325 MG tablet Take 1 tablet by mouth every 6 (six) hours as needed for severe pain. 6 tablet 0  . methocarbamol (ROBAXIN) 500 MG tablet Take 1 tablet (500 mg total) by mouth every 8 (eight) hours as needed for muscle spasms. 30 tablet 1  . methylphenidate (RITALIN) 20 MG tablet Take 1 tablet (20 mg total) by mouth daily. At noon 30 tablet 0  . metoprolol succinate (TOPROL-XL) 50 MG 24 hr tablet Take 1 tablet (50 mg total) by mouth daily. 90 tablet 3  . modafinil (PROVIGIL) 200 MG tablet Take 1 tablet (200 mg total) by mouth daily. 90 tablet 3  . naproxen (NAPROSYN) 500 MG tablet Take 1 tablet (500 mg total) by mouth 2 (two) times daily as needed for moderate pain. 30 tablet 0  . NASONEX 50 MCG/ACT nasal spray Place 50 sprays into the nose daily.    . hydrochlorothiazide (MICROZIDE) 12.5 MG capsule Take 1 capsule (12.5 mg total) by mouth daily. 90 capsule 0  . losartan (COZAAR) 100 MG tablet Take 1 tablet (100 mg total) by mouth daily. 90 tablet 0   No current facility-administered medications for this visit.      Past Medical History:  Diagnosis Date  . Anxiety   . Arthritis   . CAD (coronary artery disease)    a. 06/2014 abnl MV;  b. 06/2014 Cath: LM  nl, LAD 47m (2.75x28 Promus DES), LCX nl, RCA 60-32m (FFR = 0.80 -> 3.0x24 Promus DES), EF 55-65%.  . Carpal tunnel syndrome   . Cervical dysplasia   . Chronic fatigue   . Depression   . GERD (gastroesophageal reflux disease)   . Hypertension   . Mitral valve prolapse   . Multiple sclerosis (HCC)   . Pneumonia   . Prolonged QT interval   . RLS (restless legs syndrome) 04/28/2015    Past Surgical History:  Procedure Laterality Date  . ABDOMINAL HYSTERECTOMY    . BICEPS TENDON REPAIR    . bladder resuspension    . CARPAL TUNNEL RELEASE    . CERVICAL SPINE SURGERY  10-16-2009  . CORONARY  ANGIOPLASTY WITH STENT PLACEMENT    . DILATION AND CURETTAGE OF UTERUS    . FOOT SURGERY Bilateral    For bunions  . HAMMER TOE SURGERY Left   . LEFT HEART CATHETERIZATION WITH CORONARY ANGIOGRAM N/A 06/29/2014   Procedure: LEFT HEART CATHETERIZATION WITH CORONARY ANGIOGRAM;  Surgeon: Micheline Chapman, MD;  Location: [facility] CATH LAB;  Service: Cardiovascular;  Laterality: N/A;  . ORIF TOE FRACTURE Right   . Prolong QTC     Heart  . rectovaginal fistula repair      Social History   Socioeconomic History  . Marital status: Divorced    Spouse name: Not on file  . Number of children: 2  . Years of education: 67  . Highest education level: Not on file  Occupational History  . Occupation: Disabled    Comment: Disability  Social Needs  . Financial resource strain: Not on file  . Food insecurity:    Worry: Not on file    Inability: Not on file  . Transportation needs:    Medical: Not on file    Non-medical: Not on file  Tobacco Use  . Smoking status: Current Every Day Smoker    Packs/day: 0.50    Years: 35.00    Pack years: 17.50    Types: Cigarettes    Last attempt to quit: 06/30/2014    Years since quitting: 3.9  . Smokeless tobacco: Never Used  Substance and Sexual Activity  . Alcohol use: No    Alcohol/week: 0.0 standard drinks  . Drug use: No  . Sexual activity: Not on file  Lifestyle  . Physical activity:    Days per week: Not on file    Minutes per session: Not on file  . Stress: Not on file  Relationships  . Social connections:    Talks on phone: Not on file    Gets together: Not on file    Attends religious service: Not on file    Active member of club or organization: Not on file    Attends meetings of clubs or organizations: Not on file    Relationship status: Not on file  . Intimate partner violence:    Fear of current or ex partner: Not on file    Emotionally abused: Not on file    Physically abused: Not on file    Forced sexual activity: Not on file    Other Topics Concern  . Not on file  Social History Narrative   Patient does not drink caffeine.   Patient is right handed.    Family History  Problem Relation Age of Onset  . Melanoma Mother   . Colon polyps Mother   . Diabetes Mother   . Breast cancer Maternal Grandmother   .  Colon cancer Maternal Grandmother   . Brain cancer Maternal Grandfather   . Lung cancer Maternal Grandfather   . Diabetes Maternal Grandfather   . Kidney disease Neg Hx   . Esophageal cancer Neg Hx   . Gallbladder disease Neg Hx     ROS: Fatigue and depression but no fevers or chills, productive cough, hemoptysis, dysphasia, odynophagia, melena, hematochezia, dysuria, hematuria, rash, seizure activity, orthopnea, PND, pedal edema, claudication. Remaining systems are negative.  Physical Exam: Well-developed well-nourished in no acute distress.  Skin is warm and dry.  HEENT is normal.  Neck is supple.  Chest is clear to auscultation with normal expansion.  Cardiovascular exam is regular rate and rhythm.  Abdominal exam nontender or distended. No masses palpated. Extremities show no edema. neuro grossly intact  ECG-sinus rhythm at a rate of 70, no ST changes.  Personally reviewed  A/P  1 coronary artery disease-patient denies recurrent chest pain.  Plan to continue medical therapy with aspirin and statin.  2 tobacco abuse-patient counseled on discontinuing.  3 hypertension-patient's blood pressure is mildly elevated but she states typically controlled.  Continue present medications and follow.  She will bring her blood pressure cuff and we will correlated with ours and she will track at home.  4 hyperlipidemia-continue statin.  Check lipids.  Liver functions recently checked.  Normal with exception of mildly elevated alkaline phosphatase at 137.  5 history of prolonged QT interval-she has not had syncope.  Avoid QT prolonging medications.  Olga Millers, MD

## 2018-06-19 ENCOUNTER — Other Ambulatory Visit: Payer: Self-pay | Admitting: *Deleted

## 2018-06-19 ENCOUNTER — Encounter: Payer: Self-pay | Admitting: Cardiology

## 2018-06-19 ENCOUNTER — Ambulatory Visit (INDEPENDENT_AMBULATORY_CARE_PROVIDER_SITE_OTHER): Payer: Medicare Other | Admitting: Cardiology

## 2018-06-19 VITALS — BP 144/86 | HR 70 | Ht 61.0 in | Wt 158.8 lb

## 2018-06-19 DIAGNOSIS — R601 Generalized edema: Secondary | ICD-10-CM

## 2018-06-19 DIAGNOSIS — I10 Essential (primary) hypertension: Secondary | ICD-10-CM | POA: Diagnosis not present

## 2018-06-19 DIAGNOSIS — E7800 Pure hypercholesterolemia, unspecified: Secondary | ICD-10-CM | POA: Diagnosis not present

## 2018-06-19 DIAGNOSIS — I2510 Atherosclerotic heart disease of native coronary artery without angina pectoris: Secondary | ICD-10-CM | POA: Diagnosis not present

## 2018-06-19 DIAGNOSIS — E785 Hyperlipidemia, unspecified: Secondary | ICD-10-CM

## 2018-06-19 MED ORDER — METOPROLOL SUCCINATE ER 50 MG PO TB24: 50.0000 mg | ORAL_TABLET | Freq: Every day | ORAL | 3 refills | Status: DC

## 2018-06-19 MED ORDER — HYDROCHLOROTHIAZIDE 12.5 MG PO CAPS: 12.5000 mg | ORAL_CAPSULE | Freq: Every day | ORAL | 3 refills | Status: DC

## 2018-06-19 MED ORDER — ATORVASTATIN CALCIUM 40 MG PO TABS: 40.0000 mg | ORAL_TABLET | Freq: Every day | ORAL | 3 refills | Status: DC

## 2018-06-19 MED ORDER — LOSARTAN POTASSIUM 100 MG PO TABS: 100.0000 mg | ORAL_TABLET | Freq: Every day | ORAL | 3 refills | Status: DC

## 2018-06-19 MED ORDER — FUROSEMIDE 20 MG PO TABS: ORAL_TABLET | ORAL | 6 refills | Status: DC

## 2018-06-19 MED ORDER — HYDROCHLOROTHIAZIDE 12.5 MG PO TABS: 12.5000 mg | ORAL_TABLET | Freq: Every day | ORAL | 3 refills | Status: DC

## 2018-06-19 NOTE — Patient Instructions (Signed)
Medication Instructions:  NO CHANGE If you need a refill on your cardiac medications before your next appointment, please call your pharmacy.   Lab work: Your physician recommends that you HAVE LAB WORK TODAY If you have labs (blood work) drawn today and your tests are completely normal, you will receive your results only by: Marland Kitchen MyChart Message (if you have MyChart) OR . A paper copy in the mail If you have any lab test that is abnormal or we need to change your treatment, we will call you to review the results.   Follow-Up:  Your physician wants you to follow-up in: 1 YEAR WITH DR Jens Som You will receive a reminder letter in the mail two months in advance. If you don't receive a letter, please call our office to schedule the follow-up appointment.   CALL IN December TO SCHEDULE APPOINTMENT IN [place]

## 2018-06-20 ENCOUNTER — Telehealth: Payer: Self-pay | Admitting: Neurology

## 2018-06-20 ENCOUNTER — Telehealth: Payer: Self-pay | Admitting: Cardiology

## 2018-06-20 ENCOUNTER — Other Ambulatory Visit: Payer: Self-pay | Admitting: Neurology

## 2018-06-20 DIAGNOSIS — E785 Hyperlipidemia, unspecified: Secondary | ICD-10-CM

## 2018-06-20 DIAGNOSIS — G35 Multiple sclerosis: Secondary | ICD-10-CM

## 2018-06-20 LAB — LIPID PANEL
Chol/HDL Ratio: 3.2 ratio (ref 0.0–4.4)
Cholesterol, Total: 163 mg/dL (ref 100–199)
HDL: 51 mg/dL (ref >39)
LDL Calculated: 86 mg/dL (ref 0–99)
Triglycerides: 130 mg/dL (ref 0–149)
VLDL Cholesterol Cal: 26 mg/dL (ref 5–40)

## 2018-06-20 MED ORDER — ATORVASTATIN CALCIUM 80 MG PO TABS: 80.0000 mg | ORAL_TABLET | Freq: Every day | ORAL | 3 refills | Status: DC

## 2018-06-20 MED ORDER — METHYLPHENIDATE HCL 20 MG PO TABS: 20.0000 mg | ORAL_TABLET | Freq: Every day | ORAL | 0 refills | Status: DC

## 2018-06-20 NOTE — Telephone Encounter (Signed)
Spoke with pt, Aware of dr crenshaw's recommendations.  °

## 2018-06-20 NOTE — Addendum Note (Signed)
Addended by: Ann Maki T on: 06/20/2018 04:29 PM   Modules accepted: Orders

## 2018-06-20 NOTE — Telephone Encounter (Signed)
Pt has called for a refill on her methylphenidate (RITALIN) 20 MG tablet @ [id number]  

## 2018-06-20 NOTE — Telephone Encounter (Signed)
Pt does not have ocrevus infusion set up yet. She has called pharmacy and was advised they did not have anything. States she called infusion today but has not heard back. Patient said her infusion should have been the middle to end of January. Please call to advise

## 2018-06-20 NOTE — Telephone Encounter (Signed)
Patient returned call for lab results.  

## 2018-06-20 NOTE — Telephone Encounter (Addendum)
[name] with [facility] was able to confirm the patient has been approved for Ocrevus and she states [facility] has been trying to contact her and left the pt several vms. Almira Coaster advised she was able to speak with the pt today and advise her she has been approved.  Pt states she is battling having sinus/resp troubles(worried she could develop pneumonia) and at the end of the conversation advised Almira Coaster she did not want to pursue the Ocrevus infusion at this time. I advised Almira Coaster I would notify Dr. Anne Hahn of this encounter.

## 2018-06-20 NOTE — Telephone Encounter (Signed)
pt has called for the [facility] suite, call transferred °

## 2018-06-20 NOTE — Telephone Encounter (Signed)
I called the patient.  The patient is willing to go on Ocrevus, currently she is having some sinus issues, she will be seeing ENT physician soon, she will call us early next week to determine whether or not there may be an infectious issue, she should be in good health prior to starting the Ocrevus.

## 2018-06-20 NOTE — Telephone Encounter (Signed)
I have spoken to Almira Coaster, RN with [facility] regarding this. The pt's Ocrevus form was submitted directly to [place], RN on 04/23/18 with directions stating the pt should start this infuison mid to late January. See telephone note from 04/18/18.  Almira Coaster was provided with documentation/ocrevus start form and stated she would look in to see if this process was initiated.

## 2018-06-20 NOTE — Telephone Encounter (Signed)
[place] drug registry verified. Last refill was filled on 05/17/18 # 30 for a 30 day. Pt has been compliant with appts.

## 2018-07-04 ENCOUNTER — Telehealth: Payer: Self-pay

## 2018-07-04 NOTE — Telephone Encounter (Signed)
Received a fax from [facility] needing to verify if the pt had received her Ocrevus infusion. Pt had previously spoke with Dr. Anne Hahn on 06/20/18 and advised once she recovered from sinus issues.  I spoke with Almira Coaster with [facility] yesterday and she was able to confirm the pt has not received her infusion yet.  I sent a fax back to [facility] stating the pt has not yet received her infusion. Confirmation received.

## 2018-07-11 ENCOUNTER — Telehealth: Payer: Self-pay | Admitting: Neurology

## 2018-07-11 NOTE — Telephone Encounter (Signed)
Pt said RN in infusion advised her in addition to Ocrevus she would have to take an additional drugs with the infusion. It also included steroids, she does not want to take this due to causing yeast in the mouth. She is wanting to discuss this in detail. Please call to advise

## 2018-07-11 NOTE — Telephone Encounter (Signed)
I called the patient.  The premedication for the Ocrevus will include Benadryl and Tylenol only, not methylprednisolone.  If she does have an allergic reaction, methylprednisolone can be administered IV.

## 2018-07-11 NOTE — Telephone Encounter (Signed)
I contacted the pt. She states she has concerns about the Ocrevus infusion. She states when she has taken steroids in the past she always ends up with a horrible yeast infection. I explained to the pt the  methylprednisolone given with the Ocrevus is to help if an allergic type reaction started to take place. Pt states she understands this, but still has concerns and would like MD to follow up if possible.  Patient has been scheduled for her first Ocrevus infusion on 08/13/18 with [facility].

## 2018-07-26 ENCOUNTER — Other Ambulatory Visit: Payer: Self-pay | Admitting: Neurology

## 2018-07-26 DIAGNOSIS — G35 Multiple sclerosis: Secondary | ICD-10-CM

## 2018-07-26 MED ORDER — METHYLPHENIDATE HCL 20 MG PO TABS: 20.0000 mg | ORAL_TABLET | Freq: Every day | ORAL | 0 refills | Status: DC

## 2018-07-26 NOTE — Addendum Note (Signed)
Addended by: Geronimo Running A on: 07/26/2018 11:40 AM   Modules accepted: Orders

## 2018-07-26 NOTE — Telephone Encounter (Signed)
Pt has called for a refill on her methylphenidate (RITALIN) 20 MG tablet @ [id number]  

## 2018-07-26 NOTE — Telephone Encounter (Signed)
[place] Drug Registry checked. Ritalin last filled on 06/20/2018 for 30 days. Pt is up to date on her appts and is due for a refill.

## 2018-08-17 LAB — HEPATIC FUNCTION PANEL
ALT: 18 IU/L (ref 0–32)
AST: 16 IU/L (ref 0–40)
Albumin: 4.1 g/dL (ref 3.8–4.9)
Alkaline Phosphatase: 128 IU/L — ABNORMAL HIGH (ref 39–117)
Bilirubin Total: 0.3 mg/dL (ref 0.0–1.2)
Bilirubin, Direct: 0.1 mg/dL (ref 0.00–0.40)
Total Protein: 6.4 g/dL (ref 6.0–8.5)

## 2018-08-17 LAB — LIPID PANEL
Chol/HDL Ratio: 3 ratio (ref 0.0–4.4)
Cholesterol, Total: 124 mg/dL (ref 100–199)
HDL: 41 mg/dL (ref >39)
LDL Calculated: 59 mg/dL (ref 0–99)
Triglycerides: 121 mg/dL (ref 0–149)
VLDL Cholesterol Cal: 24 mg/dL (ref 5–40)

## 2018-08-19 ENCOUNTER — Encounter: Payer: Self-pay | Admitting: *Deleted

## 2018-08-29 ENCOUNTER — Other Ambulatory Visit: Payer: Self-pay | Admitting: Cardiology

## 2018-08-29 NOTE — Telephone Encounter (Signed)
Nexium 40 mg refilled.

## 2018-09-16 ENCOUNTER — Other Ambulatory Visit: Payer: Self-pay | Admitting: Neurology

## 2018-09-16 DIAGNOSIS — G35 Multiple sclerosis: Secondary | ICD-10-CM

## 2018-09-16 MED ORDER — METHYLPHENIDATE HCL 20 MG PO TABS: 20.0000 mg | ORAL_TABLET | Freq: Every day | ORAL | 0 refills | Status: DC

## 2018-10-02 ENCOUNTER — Telehealth: Payer: Self-pay | Admitting: Neurology

## 2018-10-02 NOTE — Telephone Encounter (Signed)
Due to current COVID 19 pandemic, our office is severely reducing in office visits until further notice, in order to minimize the risk to our patients and healthcare providers.  °  °Called patient and confirmed a virtual visit for her 6/2 appointment. Patient verbalized understanding of the doxy.me process and I have sent her an e-mail with link and directions as well as my name and office number/hours for reference. Patient understands that she will receive a call from RN to update chart. °  °Pt understands that although there may be some limitations with this type of visit, we will take all precautions to reduce any security or privacy concerns.  Pt understands that this will be treated like an in office visit and we will file with pt's insurance, and there may be a patient responsible charge related to this service. °  °

## 2018-10-07 NOTE — Telephone Encounter (Signed)
Requested a call back from the pt so we could complete he pre charting for 10/08/18 virtual visit.

## 2018-10-08 ENCOUNTER — Encounter: Payer: Self-pay | Admitting: Neurology

## 2018-10-08 ENCOUNTER — Ambulatory Visit (INDEPENDENT_AMBULATORY_CARE_PROVIDER_SITE_OTHER): Payer: Medicare Other | Admitting: Neurology

## 2018-10-08 ENCOUNTER — Other Ambulatory Visit: Payer: Self-pay

## 2018-10-08 DIAGNOSIS — R269 Unspecified abnormalities of gait and mobility: Secondary | ICD-10-CM

## 2018-10-08 DIAGNOSIS — G35 Multiple sclerosis: Secondary | ICD-10-CM | POA: Diagnosis not present

## 2018-10-08 NOTE — Telephone Encounter (Signed)
Noted, thank you

## 2018-10-08 NOTE — Telephone Encounter (Signed)
Pt returned call , stated meds havent changed and everything is still the same no new allergies

## 2018-10-08 NOTE — Progress Notes (Signed)
     Virtual Visit via Video Note  I connected with Summer Henderson on 10/08/18 at 12:00 PM EDT by a video enabled telemedicine application and verified that I am speaking with the correct person using two identifiers.  Location: Patient: The patient is at home Provider: Physician in office.   I discussed the limitations of evaluation and management by telemedicine and the availability of in person appointments. The patient expressed understanding and agreed to proceed.  History of Present Illness: Summer Henderson is a 58 year old right-handed white female with a history of multiple sclerosis.  The patient had been on Tysabri but developed very high levels of JC viral antibodies, she has been taken off of this medication and we are trying to get her on Ocrevus.  She has not yet gotten her first dose in part because of the COVID viral outbreak.  Patient has not had any new symptoms of numbness, weakness, balance changes, or changes in vision.  She does have problems with being farsighted and has difficulty reading things close up.  She has a chronic gait disturbance and a neurogenic bladder, she has not had any recent falls.  She has been followed through urology, they have discussed the possibility of Botox injections for her bladder.  She is followed through an ophthalmology physician.  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   Observations/Objective: The video evaluation reveals the patient is alert and cooperative.  She has normal speech pattern with no aphasia or dysarthria.  Extract movements are full.  She has a symmetric face, she is able to protrude the tongue in the midline with good lateral movement of the tongue.  She has good finger-nose-finger and heel shin bilaterally.  Gait is slightly wide-based, she can walk independently.  Tandem gait is unsteady, Romberg is unsteady, she has a tendency to fall.  Assessment and Plan: 1.  Multiple sclerosis  2.  Neurogenic bladder  3.  Gait  disorder  The patient will be having surgery on the left shoulder on 30 October 2018.  We will try to get the first Ocrevus injection done within 2 weeks or so after the surgery.  The patient will follow-up here in about 4 months, will check blood work at that time.  She will call for any concerns or issues.  Follow Up Instructions: 71-month follow-up with me.   I discussed the assessment and treatment plan with the patient. The patient was provided an opportunity to ask questions and all were answered. The patient agreed with the plan and demonstrated an understanding of the instructions.   The patient was advised to call back or seek an in-person evaluation if the symptoms worsen or if the condition fails to improve as anticipated.  I provided 25 minutes of non-face-to-face time during this encounter.   York Spaniel, MD

## 2018-10-09 ENCOUNTER — Telehealth: Payer: Self-pay

## 2018-10-09 NOTE — Telephone Encounter (Signed)
Left a message for the pt requesting her to call back so we could schedule her 4 month f/u with Dr. Anne Hahn

## 2018-10-22 ENCOUNTER — Other Ambulatory Visit: Payer: Self-pay | Admitting: Neurology

## 2018-10-22 DIAGNOSIS — G35 Multiple sclerosis: Secondary | ICD-10-CM

## 2018-10-22 MED ORDER — METHYLPHENIDATE HCL 20 MG PO TABS: 20.0000 mg | ORAL_TABLET | Freq: Every day | ORAL | 0 refills | Status: DC

## 2018-11-01 ENCOUNTER — Telehealth: Payer: Self-pay | Admitting: Cardiology

## 2018-11-01 DIAGNOSIS — R601 Generalized edema: Secondary | ICD-10-CM

## 2018-11-01 NOTE — Telephone Encounter (Signed)
Lasix 20 mg daily for 3 days and then as needed; bmet one week Kirk Ruths

## 2018-11-01 NOTE — Telephone Encounter (Signed)
Pt c/o swelling: STAT is pt has developed SOB within 24 hours  1) How much weight have you gained and in what time span? 4 lbs in 2 days   2) If swelling, where is the swelling located? All over her body   3) Are you currently taking a fluid pill? No   4) Are you currently SOB? No    5) Do you have a log of your daily weights (if so, list)? no  6) Have you gained 3 pounds in a day or 5 pounds in a week? No   7) Have you traveled recently? No   Patient did recently have rotator cuff surgery, she called the surgeons office they told her to call us.

## 2018-11-01 NOTE — Telephone Encounter (Signed)
Called patient and advised of note from MD and ordered blood work. Patient verbalized understanding and thankful for the help.

## 2018-11-01 NOTE — Telephone Encounter (Signed)
Called and spoke to patient, she states she had surgery on Wednesday at [facility] for her left rotator cuff. She states since then she has had increased swelling all over her body. She did wear compression stockings to see if it helped, and she states it did but the swelling is all over. She contact her surgeon office and they advises that it was not from the surgery, but could be heart related. Patient states she does not have SOB, or any CP, and I seen on medication list lasix 20 mg as needed for swelling, patient denies taking it. She has gained 4 lbs since the surgery, and just wants to know if she should be concerned.   Patient advised to take 20 mg lasix as needed, to see if it helps with swelling. Will route to MD if any other suggestions. Monitor weight and call back on Monday if continued issues.  Last blood work 7 months ago on file were good for kidney labs, none other noted.

## 2018-11-09 ENCOUNTER — Emergency Department (HOSPITAL_BASED_OUTPATIENT_CLINIC_OR_DEPARTMENT_OTHER)
Admission: EM | Admit: 2018-11-09 | Discharge: 2018-11-09 | Disposition: A | Discharge status: Home / Self Care | Payer: Medicare Other | Attending: Emergency Medicine | Admitting: Emergency Medicine

## 2018-11-09 ENCOUNTER — Emergency Department (HOSPITAL_BASED_OUTPATIENT_CLINIC_OR_DEPARTMENT_OTHER): Payer: Medicare Other

## 2018-11-09 ENCOUNTER — Other Ambulatory Visit: Payer: Self-pay

## 2018-11-09 ENCOUNTER — Encounter (HOSPITAL_BASED_OUTPATIENT_CLINIC_OR_DEPARTMENT_OTHER): Payer: Self-pay | Admitting: *Deleted

## 2018-11-09 DIAGNOSIS — Z7982 Long term (current) use of aspirin: Secondary | ICD-10-CM | POA: Diagnosis not present

## 2018-11-09 DIAGNOSIS — Y839 Surgical procedure, unspecified as the cause of abnormal reaction of the patient, or of later complication, without mention of misadventure at the time of the procedure: Secondary | ICD-10-CM | POA: Diagnosis not present

## 2018-11-09 DIAGNOSIS — R2689 Other abnormalities of gait and mobility: Secondary | ICD-10-CM | POA: Diagnosis not present

## 2018-11-09 DIAGNOSIS — R0602 Shortness of breath: Secondary | ICD-10-CM | POA: Insufficient documentation

## 2018-11-09 DIAGNOSIS — J449 Chronic obstructive pulmonary disease, unspecified: Secondary | ICD-10-CM | POA: Diagnosis not present

## 2018-11-09 DIAGNOSIS — F17210 Nicotine dependence, cigarettes, uncomplicated: Secondary | ICD-10-CM | POA: Insufficient documentation

## 2018-11-09 DIAGNOSIS — Z79899 Other long term (current) drug therapy: Secondary | ICD-10-CM | POA: Insufficient documentation

## 2018-11-09 DIAGNOSIS — R911 Solitary pulmonary nodule: Secondary | ICD-10-CM

## 2018-11-09 DIAGNOSIS — D649 Anemia, unspecified: Secondary | ICD-10-CM | POA: Insufficient documentation

## 2018-11-09 DIAGNOSIS — S20219D Contusion of unspecified front wall of thorax, subsequent encounter: Secondary | ICD-10-CM | POA: Insufficient documentation

## 2018-11-09 DIAGNOSIS — I2510 Atherosclerotic heart disease of native coronary artery without angina pectoris: Secondary | ICD-10-CM | POA: Insufficient documentation

## 2018-11-09 DIAGNOSIS — G35 Multiple sclerosis: Secondary | ICD-10-CM | POA: Diagnosis not present

## 2018-11-09 DIAGNOSIS — R0789 Other chest pain: Secondary | ICD-10-CM | POA: Diagnosis present

## 2018-11-09 LAB — CBC
HCT: 31.9 % — ABNORMAL LOW (ref 36.0–46.0)
Hemoglobin: 10.8 g/dL — ABNORMAL LOW (ref 12.0–15.0)
MCH: 30.2 pg (ref 26.0–34.0)
MCHC: 33.9 g/dL (ref 30.0–36.0)
MCV: 89.1 fL (ref 80.0–100.0)
Platelets: 499 10*3/uL — ABNORMAL HIGH (ref 150–400)
RBC: 3.58 MIL/uL — ABNORMAL LOW (ref 3.87–5.11)
RDW: 16.2 % — ABNORMAL HIGH (ref 11.5–15.5)
WBC: 10.3 10*3/uL (ref 4.0–10.5)
nRBC: 0 % (ref 0.0–0.2)

## 2018-11-09 LAB — COMPREHENSIVE METABOLIC PANEL
ALT: 17 U/L (ref 0–44)
AST: 18 U/L (ref 15–41)
Albumin: 3.6 g/dL (ref 3.5–5.0)
Alkaline Phosphatase: 139 U/L — ABNORMAL HIGH (ref 38–126)
Anion gap: 11 (ref 5–15)
BUN: 8 mg/dL (ref 6–20)
CO2: 27 mmol/L (ref 22–32)
Calcium: 8.7 mg/dL — ABNORMAL LOW (ref 8.9–10.3)
Chloride: 97 mmol/L — ABNORMAL LOW (ref 98–111)
Creatinine, Ser: 0.63 mg/dL (ref 0.44–1.00)
GFR calc Af Amer: >60 mL/min (ref >60)
GFR calc non Af Amer: >60 mL/min (ref >60)
Glucose, Bld: 97 mg/dL (ref 70–99)
Potassium: 2.8 mmol/L — ABNORMAL LOW (ref 3.5–5.1)
Sodium: 135 mmol/L (ref 135–145)
Total Bilirubin: 1 mg/dL (ref 0.3–1.2)
Total Protein: 6.6 g/dL (ref 6.5–8.1)

## 2018-11-09 LAB — PROTIME-INR
INR: 1 (ref 0.8–1.2)
Prothrombin Time: 12.6 seconds (ref 11.4–15.2)

## 2018-11-09 LAB — URINALYSIS, ROUTINE W REFLEX MICROSCOPIC
Bilirubin Urine: NEGATIVE
Glucose, UA: NEGATIVE mg/dL
Ketones, ur: NEGATIVE mg/dL
Leukocytes,Ua: NEGATIVE
Nitrite: NEGATIVE
Protein, ur: NEGATIVE mg/dL
Specific Gravity, Urine: <1.005 — ABNORMAL LOW (ref 1.005–1.030)
pH: 7 (ref 5.0–8.0)

## 2018-11-09 LAB — URINALYSIS, MICROSCOPIC (REFLEX)

## 2018-11-09 LAB — BRAIN NATRIURETIC PEPTIDE: B Natriuretic Peptide: 42.3 pg/mL (ref 0.0–100.0)

## 2018-11-09 LAB — MAGNESIUM: Magnesium: 1.8 mg/dL (ref 1.7–2.4)

## 2018-11-09 LAB — TROPONIN I (HIGH SENSITIVITY): Troponin I (High Sensitivity): 2 ng/L (ref <18)

## 2018-11-09 MED ORDER — POTASSIUM CHLORIDE CRYS ER 20 MEQ PO TBCR
40.0000 meq | EXTENDED_RELEASE_TABLET | Freq: Once | ORAL | Status: AC
  Administered 2018-11-09: 40 meq via ORAL
  Filled 2018-11-09: qty 2

## 2018-11-09 MED ORDER — IOHEXOL 350 MG/ML SOLN
100.0000 mL | Freq: Once | INTRAVENOUS | Status: AC | PRN
  Administered 2018-11-09: 72 mL via INTRAVENOUS

## 2018-11-09 NOTE — ED Provider Notes (Signed)
[facility] EMERGENCY DEPARTMENT Provider Note   CSN: [id number] Arrival date & time: 11/09/18  1405    History   Chief Complaint Chief Complaint  Patient presents with   Post-op Problem    HPI Summer Henderson is a 58 y.o. female.     58yo F w/ PMH including CAD, MS, anxiety/depression, COPD who p/w shortness of breath, chest pain, and swelling. Pt had L shoulder arthroscopy 6/24 and states she immediately felt short of breath when she woke up from surgery.  She notes that when she got changed into close, she noticed extensive swelling and tenderness on her left breast below the surgical site.  Over the next day, she developed extensive bruising on her chest wall and breasts.  She notes that she has had significant edema both in this area as well as her entire legs.  She called the surgery office and was referred to her cardiologist, Dr. Jens Somrenshaw, who instructed her to take Lasix for a few days.  She has been taking Lasix daily for a week.  She notes that she has been sleeping in her compression stockings and in the morning her leg swelling seems to be improved but it always gets worse as soon as she takes her stockings off.  Her shortness of breath was initially significant but seems to have eventually resolved.  She continues to have left chest pain that she describes as "deep in my chest" as well as breast tenderness.  She has balance problems at baseline due to her MS but feels like they have been worse recently.  No cough or fevers.  She has noticed dark urine.  The history is provided by the patient.    Past Medical History:  Diagnosis Date   Anxiety    Arthritis    CAD (coronary artery disease)    a. 06/2014 abnl MV;  b. 06/2014 Cath: LM nl, LAD 8932m (2.75x28 Promus DES), LCX nl, RCA 60-2232m (FFR = 0.80 -> 3.0x24 Promus DES), EF 55-65%.   Carpal tunnel syndrome    Cervical dysplasia    Chronic fatigue    Depression    GERD (gastroesophageal reflux disease)     Hypertension    Mitral valve prolapse    Multiple sclerosis (HCC)    Pneumonia    Prolonged QT interval    RLS (restless legs syndrome) 04/28/2015    Patient Active Problem List   Diagnosis Date Noted   Synovitis and tenosynovitis 10/18/2015   Long term current use of systemic steroids 08/12/2015   Recurrent major depressive disorder, in partial remission (HCC) 07/06/2015   Closed fracture of base of fifth metatarsal bone 07/05/2015   Foot sprain 07/05/2015   RLS (restless legs syndrome) 04/28/2015   Hyperlipidemia 08/12/2014   Triggering of digit 07/22/2014   CAD S/P LAD and RCA DES 06/29/14 06/30/2014   Arteriosclerosis of coronary artery 06/30/2014   Angina pectoris (HCC) 06/29/2014   Abnormal cardiovascular function study 06/17/2014   Rectal bleeding 05/13/2014   Special screening for malignant neoplasms, colon 05/13/2014   Abnormal CAT scan 04/22/2014   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   Vaginal pain 04/22/2014   Hypokalemia 04/22/2014   Purulent vaginal discharge    Triceps tendinitis 03/09/2014   Abnormal finding on mammography 12/31/2013   Allergic rhinitis 12/31/2013   Anxiety disorder 12/31/2013   Breast discharge 12/31/2013   Carpal tunnel syndrome 12/31/2013   Chronic obstructive pulmonary disease (HCC) 12/31/2013   Diastolic dysfunction 12/31/2013   Disease of tricuspid valve  12/31/2013   Enthesopathy of ankle and tarsus 12/31/2013   Acid reflux 12/31/2013   Adnexal pain 12/31/2013   Bloodgood disease 12/31/2013   Incomplete bladder emptying 12/31/2013   Mild episode of recurrent major depressive disorder (HCC) 12/31/2013   Disorder of mitral valve 12/31/2013   Interdigital neuralgia 12/31/2013   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   Arthritis, degenerative 12/31/2013   Osteopenia 12/31/2013   Pain in urethra 12/31/2013   Disturbance in sleep behavior 12/31/2013   Deafness, sensorineural 12/31/2013   Smokers'  cough (HCC) 12/31/2013   Compulsive tobacco user syndrome 12/31/2013   Absence of bladder continence 12/31/2013   Discharge from the vagina 12/31/2013   Edema 06/04/2013   Biceps tendinitis 05/21/2013   Backhand tennis elbow 05/21/2013   Cyst of finger 05/12/2013   Epicondylitis elbow, medial 04/01/2013   Mitral valve prolapse 02/20/2013   Essential hypertension 02/20/2013   Prolonged Q-T interval on ECG 02/20/2013   Tobacco abuse 02/20/2013   Acquired trigger finger 06/04/2012   Abnormality of gait 11/27/2011   Encounter for therapeutic drug monitoring 11/27/2011   Other malaise and fatigue 11/27/2011   Multiple sclerosis (HCC) 11/27/2011    Past Surgical History:  Procedure Laterality Date   ABDOMINAL HYSTERECTOMY     BICEPS TENDON REPAIR     bladder resuspension     CARPAL TUNNEL RELEASE     CERVICAL SPINE SURGERY  10-16-2009   CORONARY ANGIOPLASTY WITH STENT PLACEMENT     DILATION AND CURETTAGE OF UTERUS     FOOT SURGERY Bilateral    For bunions   HAMMER TOE SURGERY Left    LEFT HEART CATHETERIZATION WITH CORONARY ANGIOGRAM N/A 06/29/2014   Procedure: LEFT HEART CATHETERIZATION WITH CORONARY ANGIOGRAM;  Surgeon: Micheline Chapman, MD;  Location[facility] CATH LAB;  Service: Cardiovascular;  Laterality: N/A;   ORIF TOE FRACTURE Right    Prolong QTC     Heart   rectovaginal fistula repair     ROTATOR CUFF REPAIR Left      OB History   No obstetric history on file.      Home Medications    Prior to Admission medications   Medication Sig Start Date End Date Taking? Authorizing Provider  ALPRAZolam Prudy Feeler) 0.5 MG tablet Take 2 tablets approximately 45 minutes prior to the MRI study, take a third tablet if needed. 09/13/17  Yes York Spaniel, MD  aspirin 81 MG tablet Take 1 tablet (81 mg total) by mouth daily. 07/24/14  Yes Lewayne Bunting, MD  atorvastatin (LIPITOR) 80 MG tablet Take 1 tablet (80 mg total) by mouth daily at 6 PM. 06/20/18   Yes Crenshaw, Madolyn Frieze, MD  DULoxetine (CYMBALTA) 60 MG capsule TAKE 1 CAPSULE (60 MG TOTAL) BY MOUTH 2 (TWO) TIMES DAILY. 03/06/18  Yes York Spaniel, MD  ENABLEX 7.5 MG 24 hr tablet Take 1 tablet by mouth daily. 06/18/14  Yes [provider]  esomeprazole (NEXIUM) 40 MG capsule TAKE 1 CAPSULE (40 MG TOTAL) BY MOUTH DAILY AT 12 NOON. 08/29/18  Yes Crenshaw, Madolyn Frieze, MD  furosemide (LASIX) 20 MG tablet ONE TABLET DAILY AS NEEDED FOR SWELLING 06/19/18  Yes Crenshaw, Madolyn Frieze, MD  gabapentin (NEURONTIN) 100 MG capsule Take 1 capsule (100 mg total) by mouth 3 (three) times daily. 02/29/16  Yes York Spaniel, MD  hydrochlorothiazide (HYDRODIURIL) 12.5 MG tablet Take 1 tablet (12.5 mg total) by mouth daily. 06/19/18  Yes Lewayne Bunting, MD  HYDROcodone-acetaminophen (NORCO/VICODIN) 5-325 MG tablet  10/30/18  Yes [provider]  losartan (COZAAR) 100 MG tablet Take 1 tablet (100 mg total) by mouth daily. 06/19/18 11/09/18 Yes Lewayne Bunting, MD  methocarbamol (ROBAXIN) 500 MG tablet Take 1 tablet (500 mg total) by mouth every 8 (eight) hours as needed for muscle spasms. 09/14/17  Yes Ward, Chase Picket, PA-C  methylphenidate (RITALIN) 20 MG tablet Take 1 tablet (20 mg total) by mouth daily. At noon 10/22/18  Yes York Spaniel, MD  metoprolol succinate (TOPROL-XL) 50 MG 24 hr tablet Take 1 tablet (50 mg total) by mouth daily. 06/19/18  Yes Lewayne Bunting, MD  modafinil (PROVIGIL) 200 MG tablet Take 1 tablet (200 mg total) by mouth daily. 07/31/17  Yes York Spaniel, MD  NASONEX 50 MCG/ACT nasal spray Place 50 sprays into the nose daily. 09/16/12  Yes [provider]  hydrochlorothiazide (MICROZIDE) 12.5 MG capsule Take 1 capsule (12.5 mg total) by mouth daily. 06/19/18 09/17/18  Lewayne Bunting, MD  HYDROcodone-acetaminophen (NORCO/VICODIN) 5-325 MG tablet Take 1 tablet by mouth every 6 (six) hours as needed for severe pain. 09/14/17   Ward, Chase Picket, PA-C    naproxen (NAPROSYN) 500 MG tablet Take 1 tablet (500 mg total) by mouth 2 (two) times daily as needed for moderate pain. 09/14/17   Ward, Chase Picket, PA-C    Family History Family History  Problem Relation Age of Onset   Melanoma Mother    Colon polyps Mother    Diabetes Mother    Breast cancer Maternal Grandmother    Colon cancer Maternal Grandmother    Brain cancer Maternal Grandfather    Lung cancer Maternal Grandfather    Diabetes Maternal Grandfather    Kidney disease Neg Hx    Esophageal cancer Neg Hx    Gallbladder disease Neg Hx     Social History Social History   Tobacco Use   Smoking status: Current Every Day Smoker    Packs/day: 0.50    Years: 35.00    Pack years: 17.50    Types: Cigarettes    Last attempt to quit: 06/30/2014    Years since quitting: 4.3   Smokeless tobacco: Never Used  Substance Use Topics   Alcohol use: No    Alcohol/week: 0.0 standard drinks   Drug use: No     Allergies   Demerol [meperidine], Erythromycin, Morphine and related, Sulfa antibiotics, Diflucan [fluconazole], Other, Sulfacetamide sodium, Erythromycin base, Fesoterodine, Tape, and Toviaz [fesoterodine fumarate er]   Review of Systems Review of Systems All other systems reviewed and are negative except that which was mentioned in HPI   Physical Exam Updated Vital Signs BP 100/69    Pulse 79    Temp 98.3 F (36.8 C) (Oral)    Resp (!) 38    Ht  (1.575 m)    Wt 69.9 kg    SpO2 98%    BMI 28.17 kg/m   Physical Exam Vitals signs and nursing note reviewed.  Constitutional:      General: She is not in acute distress.    Appearance: She is well-developed.  HENT:     Head: Normocephalic and atraumatic.  Eyes:     Conjunctiva/sclera: Conjunctivae normal.  Neck:     Musculoskeletal: Neck supple.  Cardiovascular:     Rate and Rhythm: Normal rate and regular rhythm.     Heart sounds: Normal heart sounds. No murmur.  Pulmonary:     Effort: Pulmonary  effort is normal.     Breath sounds:  Normal breath sounds.  Chest:     Chest wall: Tenderness present.  Abdominal:     General: Bowel sounds are normal. There is no distension.     Palpations: Abdomen is soft.     Tenderness: There is no abdominal tenderness.  Musculoskeletal:     Right lower leg: No edema.     Left lower leg: No edema.     Comments: Edema of L shoulder and upper arm, mild generalized tenderness; sutures in place on anterior shoulder with incision clean and dry, no redness or drainage  Skin:    General: Skin is warm and dry.     Findings: Bruising present.     Comments: Extensive bruising already in stages of healing on chest wall including L anterior shoulder, L breast, central chest, and extending to R breast  Neurological:     Mental Status: She is alert and oriented to person, place, and time.     Comments: Fluent speech  Psychiatric:        Judgment: Judgment normal.      ED Treatments / Results  Labs (all labs ordered are listed, but only abnormal results are displayed) Labs Reviewed  CBC - Abnormal; Notable for the following components:      Result Value   RBC 3.58 (*)    Hemoglobin 10.8 (*)    HCT 31.9 (*)    RDW 16.2 (*)    Platelets 499 (*)    All other components within normal limits  COMPREHENSIVE METABOLIC PANEL - Abnormal; Notable for the following components:   Potassium 2.8 (*)    Chloride 97 (*)    Calcium 8.7 (*)    Alkaline Phosphatase 139 (*)    All other components within normal limits  URINALYSIS, ROUTINE W REFLEX MICROSCOPIC - Abnormal; Notable for the following components:   Specific Gravity, Urine <1.005 (*)    Hgb urine dipstick TRACE (*)    All other components within normal limits  URINALYSIS, MICROSCOPIC (REFLEX) - Abnormal; Notable for the following components:   Bacteria, UA RARE (*)    All other components within normal limits  PROTIME-INR  BRAIN NATRIURETIC PEPTIDE  TROPONIN I (HIGH SENSITIVITY)  MAGNESIUM    TROPONIN I (HIGH SENSITIVITY)    EKG EKG Interpretation  Date/Time:  Saturday November 09 2018 14:21:44 EDT Ventricular Rate:  82 PR Interval:    QRS Duration: 89 QT Interval:  417 QTC Calculation: 487 R Axis:   59 Text Interpretation:  Sinus rhythm Low voltage, precordial leads Abnormal R-wave progression, early transition Borderline prolonged QT interval No significant change since last tracing Confirmed by Frederick PeersLittle, Wilbern Pennypacker [phone]) on 11/09/2018 3:27:44 PM   Radiology Dg Chest 2 View  Result Date: 11/09/2018 CLINICAL DATA:  Rotator cuff surgery of left shoulder October 30, 2018. Swelling and bruising of left shoulder. EXAM: CHEST - 2 VIEW COMPARISON:  None. FINDINGS: The heart size and mediastinal contours are within normal limits. Both lungs are clear. The visualized skeletal structures are stable. IMPRESSION: No active cardiopulmonary disease. Electronically Signed   By: Sherian ReinWei-Chen  Lin M.D.   On: 11/09/2018 16:16   Ct Angio Chest Pe W/cm &/or Wo Cm  Result Date: 11/09/2018 CLINICAL DATA:  Dyspnea after left shoulder surgery. Chest wall bruising. EXAM: CT ANGIOGRAPHY CHEST WITH CONTRAST TECHNIQUE: Multidetector CT imaging of the chest was performed using the standard protocol during bolus administration of intravenous contrast. Multiplanar CT image reconstructions and MIPs were obtained to evaluate the vascular anatomy. CONTRAST:  72mL OMNIPAQUE  IOHEXOL 350 MG/ML SOLN COMPARISON:  Chest radiograph from earlier today. FINDINGS: Cardiovascular: The study is moderate quality for the evaluation of pulmonary embolism, limited by suboptimal contrast opacification and minimal motion. There are no filling defects in the central, lobar, segmental or subsegmental pulmonary artery branches to suggest acute pulmonary embolism. Mildly atherosclerotic nonaneurysmal thoracic aorta. Normal caliber pulmonary arteries. Normal heart size. No significant pericardial fluid/thickening. Left anterior descending and right  coronary atherosclerosis. Mediastinum/Nodes: No discrete thyroid nodules. Unremarkable esophagus. No pathologically enlarged axillary, mediastinal or hilar lymph nodes. Lungs/Pleura: No pneumothorax. No pleural effusion. Moderate to severe centrilobular emphysema with diffuse bronchial wall thickening. No central airway stenoses. Left lower lobe solid 5 mm nodule (series 7/image 80) is stable since 2009 CT, considered benign. Right upper lobe sub solid 7 mm nodule (series 7/image 47), not seen on prior. No acute consolidative airspace disease, lung masses or additional significant pulmonary nodules. Upper abdomen: Exophytic 2.4 cm hypodense renal cortical lesion in the posterior upper right kidney (series 6/image 110) with density 24 HU. Musculoskeletal: No aggressive appearing focal osseous lesions. Patchy fluid and soft tissue density within subcutaneous anterior left shoulder soft tissues. Moderate thoracic spondylosis. Status post ACDF C4-5. Review of the MIP images confirms the above findings. IMPRESSION: 1. No evidence of pulmonary embolism. 2. Two vessel coronary atherosclerosis. 3. Moderate to severe centrilobular emphysema with diffuse bronchial wall thickening, suggesting COPD. 4. Sub solid right upper lobe 7 mm pulmonary nodule. Follow-up non-contrast CT recommended at 3-6 months to confirm persistence. If unchanged, and solid component remains <6 mm, annual CT is recommended until 5 years of stability has been established. If persistent these nodules should be considered highly suspicious if the solid component of the nodule is 6 mm or greater in size and enlarging. This recommendation follows the consensus statement: Guidelines for Management of Incidental Pulmonary Nodules Detected on CT Images: From the Fleischner Society 2017; Radiology 2017; 284:228-243. 5. Indeterminate exophytic 2.4 cm posterior upper right renal cortical lesion. Short-term outpatient follow-up renal mass protocol MRI (preferred)  or CT abdomen without and with IV contrast is indicated to exclude renal cell carcinoma. 6. Patchy fluid in soft tissue density within anterior left shoulder subcutaneous soft tissues, presumably postsurgical change. Aortic Atherosclerosis (ICD10-I70.0) and Emphysema (ICD10-J43.9). Electronically Signed   By: Delbert PhenixJason A Poff M.D.   On: 11/09/2018 18:05    Procedures Procedures (including critical care time)  Medications Ordered in ED Medications  potassium chloride SA (K-DUR) CR tablet 40 mEq (40 mEq Oral Given 11/09/18 1542)  iohexol (OMNIPAQUE) 350 MG/ML injection 100 mL (72 mLs Intravenous Contrast Given 11/09/18 1648)     Initial Impression / Assessment and Plan / ED Course  I have reviewed the triage vital signs and the nursing notes.  Pertinent labs & imaging results that were available during my care of the patient were reviewed by me and considered in my medical decision making (see chart for details).       VS reassuring, normal WOB. Extensive hematoma on shoulder and chest wall.  DDx includes pulmonary edema, PE, diaphragm paralysis from nerve block, anemia from blood loss.  Labs show Hgb 10.9, dropped from 13 however not low enough to require transfusion. K 2.8, gave repletion. BNP and trop normal, CXR clear making CHF unlikely. Her symptoms have been going on for a while and overall improving, doubt ACS.   Obtained CTA chest to r/o PE. CT negative for PE, she does have incidental findings of pulmonary nodule and renal lesion. Discussed PCP  f/u for imaging for both findings. Instructed to contact neurologist for balance problems. She voiced understanding of plan.  Final Clinical Impressions(s) / ED Diagnoses   Final diagnoses:  Hematoma of chest wall, unspecified laterality, subsequent encounter  Anemia, unspecified type  Pulmonary nodule  Balance problems    ED Discharge Orders    None       Kellan Boehlke, Wenda Overland, MD 11/09/18 [phone]

## 2018-11-09 NOTE — ED Triage Notes (Signed)
Pt had rotator cuff repair on June 24. Pt reports she had significant bleeding and swelling in her chest during surgery. She saw her gyn this week (due to swelling of her left breast) who took pictures of the bruising who contacted her surgeon at [facility], but she got no response. She also has contacted her cardiologist since the procedure who advised her to take lasix for a few days. She is wearing compression socks but still has more swelling than normal. She reports pain in her  "whole chest cavity" and states her urine has been dark

## 2018-11-09 NOTE — ED Notes (Signed)
ED Provider at bedside. 

## 2018-11-10 ENCOUNTER — Telehealth: Payer: Self-pay | Admitting: Cardiology

## 2018-11-10 NOTE — Telephone Encounter (Signed)
Pt called to make sure Dr. Stanford Breed knew the results of [facility] lab results.  Reminded pt importance of following up on her CT scan with pulmonary nodule and lesion on kidney.  She will see urology.  Encouraged her to get PCP.  We were disconnected and I did not call back.

## 2018-11-12 ENCOUNTER — Encounter: Payer: Self-pay | Admitting: *Deleted

## 2018-11-12 ENCOUNTER — Telehealth: Payer: Self-pay | Admitting: *Deleted

## 2018-11-12 NOTE — Telephone Encounter (Signed)
-----   Message from Lelon Perla, MD sent at 11/11/2018  7:23 AM EDT ----- Please make sure pt has fu with primary care for lung nodule and urology for renal mass [facility]

## 2018-11-12 NOTE — Telephone Encounter (Addendum)
pt aware of results   ----- Message from Lelon Perla, MD sent at 11/11/2018  7:23 AM EDT ----- Please make sure pt has fu with primary care for lung nodule and urology for renal mass [facility]

## 2018-11-12 NOTE — Telephone Encounter (Signed)
This encounter was created in error - please disregard.

## 2018-12-03 ENCOUNTER — Other Ambulatory Visit: Payer: Self-pay | Admitting: Neurology

## 2018-12-03 DIAGNOSIS — G35 Multiple sclerosis: Secondary | ICD-10-CM

## 2018-12-03 MED ORDER — METHYLPHENIDATE HCL 20 MG PO TABS: 20.0000 mg | ORAL_TABLET | Freq: Every day | ORAL | 0 refills | Status: DC

## 2019-01-08 NOTE — Progress Notes (Signed)
Virtual Visit via Video Note   This visit type was conducted due to national recommendations for restrictions regarding the COVID-19 Pandemic (e.g. social distancing) in an effort to limit this patient's exposure and mitigate transmission in our community.  Due to her co-morbid illnesses, this patient is at least at moderate risk for complications without adequate follow up.  This format is felt to be most appropriate for this patient at this time.  All issues noted in this document were discussed and addressed.  A limited physical exam was performed with this format.  Please refer to the patient's chart for her consent to telehealth for [facility].   Date:  01/09/2019   ID:  Summer Henderson, DOB [birth year], MRN [id number]  Patient Location:Home Provider Location: Home  PCP:  Patient, No Pcp Per  Cardiologist:  Dr Jens Som  Evaluation Performed:  Follow-Up Visit  Chief Complaint:  FU CAD  History of Present Illness:    CAD, hypertension, prolonged QT and mitral valve prolapse. Holter monitor in September of 2012 showed sinus rhythm with rare PVC. Echocardiogram in June of 2015 showed normal LV function and mild left atrial enlargement. There was trace mitral regurgitation. Patient had an abdominal CT for abdominal pain in December 2015. 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 Nuclear study January 2016 showed an ejection fraction of 56%, probable mild breast attenuation with possible mild mid anteroseptal ischemia. Cardiac catheterization February 2016 showed 80% LAD and 60-70% RCA. Ejection fraction 55-65%. The patient had PCI of the LAD and RCA; ultrasound March 2016 showed no DVT.  Chest CT July 2020 showed no pulmonary embolus.  There was note of emphysema.  Right upper lobe nodule and follow-up recommended 3 to 6 months.  There was also note of a right renal cortical lesion and renal mass protocol MRI recommended.  Since she was last  seen, patient denies dyspnea or chest pain.  She has noticed bilateral lower extremity edema since her previous surgery.  Some improvement overnight.  No syncope.  The patient does not have symptoms concerning for COVID-19 infection (fever, chills, cough, or new shortness of breath).    Past Medical History:  Diagnosis Date  . Anxiety   . Arthritis   . CAD (coronary artery disease)    a. 06/2014 abnl MV;  b. 06/2014 Cath: LM nl, LAD 72m (2.75x28 Promus DES), LCX nl, RCA 60-81m (FFR = 0.80 -> 3.0x24 Promus DES), EF 55-65%.  . Carpal tunnel syndrome   . Cervical dysplasia   . Chronic fatigue   . Depression   . GERD (gastroesophageal reflux disease)   . Hypertension   . Mitral valve prolapse   . Multiple sclerosis (HCC)   . Pneumonia   . Prolonged QT interval   . RLS (restless legs syndrome) 04/28/2015   Past Surgical History:  Procedure Laterality Date  . ABDOMINAL HYSTERECTOMY    . BICEPS TENDON REPAIR    . bladder resuspension    . CARPAL TUNNEL RELEASE    . CERVICAL SPINE SURGERY  10-16-2009  . CORONARY ANGIOPLASTY WITH STENT PLACEMENT    . DILATION AND CURETTAGE OF UTERUS    . FOOT SURGERY Bilateral    For bunions  . HAMMER TOE SURGERY Left   . LEFT HEART CATHETERIZATION WITH CORONARY ANGIOGRAM N/A 06/29/2014   Procedure: LEFT HEART CATHETERIZATION WITH CORONARY ANGIOGRAM;  Surgeon: Micheline Chapman, MD;  Location: [facility] CATH LAB;  Service: Cardiovascular;  Laterality: N/A;  .  ORIF TOE FRACTURE Right   . Prolong QTC     Heart  . rectovaginal fistula repair    . ROTATOR CUFF REPAIR Left      Current Meds  Medication Sig  . aspirin 81 MG tablet Take 1 tablet (81 mg total) by mouth daily.  Marland Kitchen atorvastatin (LIPITOR) 80 MG tablet Take 1 tablet (80 mg total) by mouth daily at 6 PM.  . DULoxetine (CYMBALTA) 60 MG capsule TAKE 1 CAPSULE (60 MG TOTAL) BY MOUTH 2 (TWO) TIMES DAILY.  Marland Kitchen ENABLEX 7.5 MG 24 hr tablet Take 1 tablet by mouth daily.  Marland Kitchen esomeprazole (NEXIUM) 40 MG capsule  TAKE 1 CAPSULE (40 MG TOTAL) BY MOUTH DAILY AT 12 NOON.  Marland Kitchen gabapentin (NEURONTIN) 100 MG capsule Take 1 capsule (100 mg total) by mouth 3 (three) times daily.  . hydrochlorothiazide (HYDRODIURIL) 12.5 MG tablet Take 1 tablet (12.5 mg total) by mouth daily.  Marland Kitchen losartan (COZAAR) 100 MG tablet Take 1 tablet (100 mg total) by mouth daily.  . methylphenidate (RITALIN) 20 MG tablet Take 1 tablet (20 mg total) by mouth daily. At noon  . metoprolol succinate (TOPROL-XL) 50 MG 24 hr tablet Take 1 tablet (50 mg total) by mouth daily.  . modafinil (PROVIGIL) 200 MG tablet Take 1 tablet (200 mg total) by mouth daily.  Marland Kitchen NASONEX 50 MCG/ACT nasal spray Place 50 sprays into the nose daily.     Allergies:   Demerol [meperidine], Erythromycin, Morphine and related, Sulfa antibiotics, Diflucan [fluconazole], Other, Sulfacetamide sodium, Erythromycin base, Fesoterodine, Tape, and Toviaz [fesoterodine fumarate er]   Social History   Tobacco Use  . Smoking status: Current Every Day Smoker    Packs/day: 0.50    Years: 35.00    Pack years: 17.50    Types: Cigarettes    Last attempt to quit: 06/30/2014    Years since quitting: 4.5  . Smokeless tobacco: Never Used  Substance Use Topics  . Alcohol use: No    Alcohol/week: 0.0 standard drinks  . Drug use: No     Family Hx: The patient's family history includes Brain cancer in her maternal grandfather; Breast cancer in her maternal grandmother; Colon cancer in her maternal grandmother; Colon polyps in her mother; Diabetes in her maternal grandfather and mother; Lung cancer in her maternal grandfather; Melanoma in her mother. There is no history of Kidney disease, Esophageal cancer, or Gallbladder disease.  ROS:   Please see the history of present illness.    No Fever, chills  or productive cough All other systems reviewed and are negative.  Recent Labs: 11/09/2018: ALT 17; B Natriuretic Peptide 42.3; BUN 8; Creatinine, Ser 0.63; Hemoglobin 10.8; Magnesium  1.8; Platelets 499; Potassium 2.8; Sodium 135   Recent Lipid Panel Lab Results  Component Value Date/Time   CHOL 124 08/16/2018 01:28 PM   TRIG 121 08/16/2018 01:28 PM   HDL 41 08/16/2018 01:28 PM   CHOLHDL 3.0 08/16/2018 01:28 PM   CHOLHDL 2.4 11/18/2015 11:23 AM   LDLCALC 59 08/16/2018 01:28 PM    Wt Readings from Last 3 Encounters:  01/09/19 153 lb (69.4 kg)  11/09/18 154 lb (69.9 kg)  06/19/18 158 lb 12.8 oz (72 kg)     Objective:    Vital Signs:  BP (!) 150/99   Pulse 80   Ht 5' 1.5" (1.562 m)   Wt 153 lb (69.4 kg)   BMI 28.44 kg/m    VITAL SIGNS:  reviewed NAD Answers questions appropriately Normal affect Remainder  of physical examination not performed (telehealth visit; coronavirus pandemic)  ASSESSMENT & PLAN:    1. Coronary artery disease-no chest pain.  Continue medical therapy with aspirin and statin. 2. Hypertension-blood pressure is elevated.  However she states typically controlled.  I have asked her to follow this and we will add additional medications if needed. 3. Hyperlipidemia-continue statin. 4. Tobacco abuse-patient counseled on discontinuing. 5. History of prolonged QT-no syncope. 6. Pulmonary nodule-she is to establish with primary care this month.  I have asked her to discuss this further with them as she will need follow-up imaging. 7. Possible renal mass-I asked her to arrange clinic visit with Dr. Shaune SpittleEschew who is her urologist for further evaluation.  This was also relayed to her previously by phone at time of CT scan.  Patient understands. 8. Bilateral lower extremity edema-etiology unclear.  We will arrange echocardiogram to assess LV and RV function.  Given recent surgery I will perform bilateral venous Dopplers.  We have added Lasix 20 mg daily (I discontinued hydrochlorothiazide) and KCl 10 mEq daily.  Check potassium and renal function in 5 days.  COVID-19 Education: The importance of social distancing was discussed today.  Time:    Today, I have spent 40 minutes with the patient with telehealth technology discussing the above problems.     Medication Adjustments/Labs and Tests Ordered: Current medicines are reviewed at length with the patient today.  Concerns regarding medicines are outlined above.   Tests Ordered: No orders of the defined types were placed in this encounter.   Medication Changes: No orders of the defined types were placed in this encounter.   Follow Up:  Virtual Visit or In Person in 1 year(s)  Signed, Olga MillersBrian Ahriana Gunkel, MD  01/09/2019 11:02 AM    [facility]

## 2019-01-09 ENCOUNTER — Telehealth (INDEPENDENT_AMBULATORY_CARE_PROVIDER_SITE_OTHER): Payer: Medicare Other | Admitting: Cardiology

## 2019-01-09 ENCOUNTER — Encounter: Payer: Self-pay | Admitting: *Deleted

## 2019-01-09 ENCOUNTER — Encounter: Payer: Self-pay | Admitting: Cardiology

## 2019-01-09 VITALS — BP 150/99 | HR 80 | Ht 61.5 in | Wt 153.0 lb

## 2019-01-09 DIAGNOSIS — I10 Essential (primary) hypertension: Secondary | ICD-10-CM

## 2019-01-09 DIAGNOSIS — R601 Generalized edema: Secondary | ICD-10-CM | POA: Diagnosis not present

## 2019-01-09 DIAGNOSIS — I2510 Atherosclerotic heart disease of native coronary artery without angina pectoris: Secondary | ICD-10-CM | POA: Diagnosis not present

## 2019-01-09 DIAGNOSIS — E7800 Pure hypercholesterolemia, unspecified: Secondary | ICD-10-CM

## 2019-01-09 MED ORDER — POTASSIUM CHLORIDE ER 10 MEQ PO TBCR: 10.0000 meq | EXTENDED_RELEASE_TABLET | Freq: Every day | ORAL | 3 refills | Status: DC

## 2019-01-09 MED ORDER — FUROSEMIDE 20 MG PO TABS: 20.0000 mg | ORAL_TABLET | Freq: Every day | ORAL | 3 refills | Status: DC

## 2019-01-09 NOTE — Telephone Encounter (Signed)
Left message for office to call and schedule follow up appointment to follow up on renal mass seen on CT scan from 11-2018. Patient made aware.

## 2019-01-09 NOTE — Patient Instructions (Signed)
Medication Instructions:  STOP HCTZ  START FUROSEMIDE 20 MG ONCE DAILY  START POTASSIUM CHLORIDE 10 MEQ ONCE DAILY  If you need a refill on your cardiac medications before your next appointment, please call your pharmacy.   Lab work: Your physician recommends that you return for lab work Wednesday 01-15-2019  If you have labs (blood work) drawn today and your tests are completely normal, you will receive your results only by: Marland Kitchen MyChart Message (if you have MyChart) OR . A paper copy in the mail If you have any lab test that is abnormal or we need to change your treatment, we will call you to review the results.  Testing/Procedures: Your physician has requested that you have an echocardiogram. Echocardiography is a painless test that uses sound waves to create images of your heart. It provides your doctor with information about the size and shape of your heart and how well your heart's chambers and valves are working. This procedure takes approximately one hour. There are no restrictions for this procedure.AT THE [facility]  Your physician has requested that you have a lower extremity venous duplex. This test is an ultrasound of the veins in the legs. It looks at venous blood flow that carries blood from the heart to the legs or arms. Allow one hour for a Lower Venous exam. There are no restrictions or special instructions.AT THE [facility]  Follow-Up: At [facility], you and your health needs are our priority.  As part of our continuing mission to provide you with exceptional heart care, we have created designated Provider Care Teams.  These Care Teams include your primary Cardiologist (physician) and Advanced Practice Providers (APPs -  Physician Assistants and Nurse Practitioners) who all work together to provide you with the care you need, when you need it. Your physician recommends that you schedule a follow-up appointment in: [place]

## 2019-01-15 ENCOUNTER — Other Ambulatory Visit: Payer: Self-pay

## 2019-01-15 ENCOUNTER — Ambulatory Visit (HOSPITAL_BASED_OUTPATIENT_CLINIC_OR_DEPARTMENT_OTHER)
Admission: RE | Admit: 2019-01-15 | Discharge: 2019-01-15 | Disposition: A | Discharge status: Home / Self Care | Payer: Medicare Other | Source: Ambulatory Visit | Attending: Cardiology | Admitting: Cardiology

## 2019-01-15 DIAGNOSIS — R601 Generalized edema: Secondary | ICD-10-CM | POA: Insufficient documentation

## 2019-01-15 NOTE — Progress Notes (Signed)
  Echocardiogram 2D Echocardiogram has been performed.  Summer Henderson 01/15/2019, 4:09 PM

## 2019-01-15 NOTE — Progress Notes (Signed)
Bilateral lower extremity venous doppler performed  01/15/19 Cardell Peach RDCS, RVT

## 2019-01-16 LAB — BASIC METABOLIC PANEL
BUN/Creatinine Ratio: 7 — ABNORMAL LOW (ref 9–23)
BUN: 5 mg/dL — ABNORMAL LOW (ref 6–24)
CO2: 27 mmol/L (ref 20–29)
Calcium: 9.2 mg/dL (ref 8.7–10.2)
Chloride: 103 mmol/L (ref 96–106)
Creatinine, Ser: 0.74 mg/dL (ref 0.57–1.00)
GFR calc Af Amer: 105 mL/min/{1.73_m2} (ref >59)
GFR calc non Af Amer: 91 mL/min/{1.73_m2} (ref >59)
Glucose: 90 mg/dL (ref 65–99)
Potassium: 4.1 mmol/L (ref 3.5–5.2)
Sodium: 142 mmol/L (ref 134–144)

## 2019-01-27 ENCOUNTER — Other Ambulatory Visit: Payer: Self-pay | Admitting: Neurology

## 2019-01-27 ENCOUNTER — Encounter: Payer: Self-pay | Admitting: *Deleted

## 2019-01-27 DIAGNOSIS — G35 Multiple sclerosis: Secondary | ICD-10-CM

## 2019-01-27 MED ORDER — METHYLPHENIDATE HCL 20 MG PO TABS: 20.0000 mg | ORAL_TABLET | Freq: Every day | ORAL | 0 refills | Status: DC

## 2019-01-27 MED ORDER — MODAFINIL 200 MG PO TABS: 200.0000 mg | ORAL_TABLET | Freq: Every day | ORAL | 3 refills | Status: DC

## 2019-01-28 ENCOUNTER — Telehealth: Payer: Self-pay

## 2019-01-28 NOTE — Telephone Encounter (Signed)
This encounter was created in error - please disregard.

## 2019-01-28 NOTE — Telephone Encounter (Signed)
PA for Modafinil has been submitted via cover my meds.   (Key: [id number])  Your information has been sent to [employer].  Waiting on response.

## 2019-01-29 NOTE — Telephone Encounter (Signed)
PA has been approved through [employer] for modafinil. Approval effective 01/29/2019- 07/28/2019.

## 2019-02-10 NOTE — Progress Notes (Signed)
HPI: CAD, hypertension, prolonged QT and mitral valve prolapse. Holter monitor in September of 2012 showed sinus rhythm with rare PVC. Patient had an abdominal CT for abdominal pain in December 2015. 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 Nuclear study January 2016 showed an ejection fraction of 56%, probable mild breast attenuation with possible mild mid anteroseptal ischemia. Cardiac catheterization February 2016 showed 80% LAD and 60-70% RCA. Ejection fraction 55-65%. The patient had PCI of the LAD and RCA; ultrasound March 2016 showed no DVT.  Chest CT July 2020 showed no pulmonary embolus.  There was note of emphysema.  Right upper lobe nodule and follow-up recommended 3 to 6 months.  There was also note of a right renal cortical lesion and renal mass protocol MRI recommended.  Echocardiogram September 2020 showed normal LV function, grade 1 diastolic dysfunction.  Venous Dopplers September 2020 showed no DVT. Since she was last seen,  she denies dyspnea, chest pain, palpitations or syncope.  She describes occasional pedal edema.  Current Outpatient Medications  Medication Sig Dispense Refill  . aspirin 81 MG tablet Take 1 tablet (81 mg total) by mouth daily. 30 tablet   . atorvastatin (LIPITOR) 80 MG tablet Take 1 tablet (80 mg total) by mouth daily at 6 PM. 90 tablet 3  . clonazepam (KLONOPIN) 0.125 MG disintegrating tablet Take 0.125 mg by mouth as directed.    . DULoxetine (CYMBALTA) 60 MG capsule TAKE 1 CAPSULE (60 MG TOTAL) BY MOUTH 2 (TWO) TIMES DAILY. (Patient taking differently: 30 mg. TAKE 1 CAPSULE (60 MG TOTAL) BY MOUTH 2 (TWO) TIMES DAILY.) 180 capsule 1  . ENABLEX 7.5 MG 24 hr tablet Take 1 tablet by mouth daily.    Marland Kitchen esomeprazole (NEXIUM) 40 MG capsule TAKE 1 CAPSULE (40 MG TOTAL) BY MOUTH DAILY AT 12 NOON. 90 capsule 3  . furosemide (LASIX) 20 MG tablet Take 1 tablet (20 mg total) by mouth daily. 90 tablet 3  .  gabapentin (NEURONTIN) 100 MG capsule Take 1 capsule (100 mg total) by mouth 3 (three) times daily. (Patient taking differently: Take 100 mg by mouth daily as needed. ) 270 capsule 0  . hydrochlorothiazide (MICROZIDE) 12.5 MG capsule Take 12.5 mg by mouth daily.    Marland Kitchen losartan (COZAAR) 100 MG tablet Take 1 tablet (100 mg total) by mouth daily. 90 tablet 3  . methocarbamol (ROBAXIN) 500 MG tablet Take 500 mg by mouth every 8 (eight) hours as needed for muscle spasms.    . methylphenidate (RITALIN) 20 MG tablet Take 1 tablet (20 mg total) by mouth daily. At noon 30 tablet 0  . metoprolol succinate (TOPROL-XL) 50 MG 24 hr tablet Take 1 tablet (50 mg total) by mouth daily. 90 tablet 3  . modafinil (PROVIGIL) 200 MG tablet Take 1 tablet (200 mg total) by mouth daily. 90 tablet 3  . NASONEX 50 MCG/ACT nasal spray Place 50 sprays into the nose daily.    . potassium chloride (K-DUR) 10 MEQ tablet Take 1 tablet (10 mEq total) by mouth daily. 90 tablet 3   No current facility-administered medications for this visit.      Past Medical History:  Diagnosis Date  . Anxiety   . Arthritis   . CAD (coronary artery disease)    a. 06/2014 abnl MV;  b. 06/2014 Cath: LM nl, LAD 66m (2.75x28 Promus DES), LCX nl, RCA 60-46m (FFR = 0.80 -> 3.0x24 Promus DES), EF 55-65%.  Marland Kitchen  Carpal tunnel syndrome   . Cervical dysplasia   . Chronic fatigue   . Depression   . GERD (gastroesophageal reflux disease)   . Hypertension   . Mitral valve prolapse   . Multiple sclerosis ([place])   . Pneumonia   . Prolonged QT interval   . RLS (restless legs syndrome) 04/28/2015    Past Surgical History:  Procedure Laterality Date  . ABDOMINAL HYSTERECTOMY    . BICEPS TENDON REPAIR    . bladder resuspension    . CARPAL TUNNEL RELEASE    . CERVICAL SPINE SURGERY  10-16-2009  . CORONARY ANGIOPLASTY WITH STENT PLACEMENT    . DILATION AND CURETTAGE OF UTERUS    . FOOT SURGERY Bilateral    For bunions  . HAMMER TOE SURGERY Left   .  LEFT HEART CATHETERIZATION WITH CORONARY ANGIOGRAM N/A 06/29/2014   Procedure: LEFT HEART CATHETERIZATION WITH CORONARY ANGIOGRAM;  Surgeon: Blane Ohara, MD;  [facility] C[facility] LAB;  Service: Cardiovascular;  Laterality: N/A;  . ORIF TOE FRACTURE Right   . Prolong QTC     Heart  . rectovaginal fistula repair    . ROTATOR CUFF REPAIR Left     Social History   Socioeconomic History  . Marital status: Divorced    Spouse name: Not on file  . Number of children: 2  . Years of education: 90  . Highest education level: Not on file  Occupational History  . Occupation: Disabled    Comment: Disability  Social Needs  . Financial resource strain: Not on file  . Food insecurity    Worry: Not on file    Inability: Not on file  . Transportation needs    Medical: Not on file    Non-medical: Not on file  Tobacco Use  . Smoking status: Current Every Day Smoker    Packs/day: 0.50    Years: 35.00    Pack years: 17.50    Types: Cigarettes    Last attempt to quit: 06/30/2014    Years since quitting: 4.6  . Smokeless tobacco: Never Used  Substance and Sexual Activity  . Alcohol use: No    Alcohol/week: 0.0 standard drinks  . Drug use: No  . Sexual activity: Not on file  Lifestyle  . Physical activity    Days per week: Not on file    Minutes per session: Not on file  . Stress: Not on file  Relationships  . Social Herbalist on phone: Not on file    Gets together: Not on file    Attends religious service: Not on file    Active member of club or organization: Not on file    Attends meetings of clubs or organizations: Not on file    Relationship status: Not on file  . Intimate partner violence    Fear of current or ex partner: Not on file    Emotionally abused: Not on file    Physically abused: Not on file    Forced sexual activity: Not on file  Other Topics Concern  . Not on file  Social History Narrative   Patient does not drink caffeine.   Patient is right handed.     Family History  Problem Relation Age of Onset  . Melanoma Mother   . Colon polyps Mother   . Diabetes Mother   . Breast cancer Maternal Grandmother   . Colon cancer Maternal Grandmother   . Brain cancer Maternal Grandfather   . Lung  cancer Maternal Grandfather   . Diabetes Maternal Grandfather   . Kidney disease Neg Hx   . Esophageal cancer Neg Hx   . Gallbladder disease Neg Hx     ROS: Some imbalance from MS but no fevers or chills, productive cough, hemoptysis, dysphasia, odynophagia, melena, hematochezia, dysuria, hematuria, rash, seizure activity, orthopnea, PND, pedal edema, claudication. Remaining systems are negative.  Physical Exam: Well-developed well-nourished in no acute distress.  Skin is warm and dry.  HEENT is normal.  Neck is supple.  Chest is clear to auscultation with normal expansion.  Cardiovascular exam is regular rate and rhythm.  Abdominal exam nontender or distended. No masses palpated. Extremities show no edema. neuro grossly intact  ECG-normal sinus rhythm, prolonged QT interval.  Personally reviewed  A/P  1 CAD-Pt denies CP; continue ASA and statin.  2 hypertension-patient's blood pressure is mildly elevated; add amlodipine 5 mg daily and follow.  3 hyperlipidemia-continue statin.  4 tobacco abuse-patient previously counseled on discontinuing.  5 history of prolonged QT interval-no history of syncope.  6 pulmonary nodule-patient to follow-up with primary care for this issue.  7 possible renal mass-she has followed up with urology.  8 chronic diastolic congestive heart failure/lower extremity edema-continue Lasix at present dose.  Check potassium and renal function.  Olga Millers, MD

## 2019-02-19 ENCOUNTER — Encounter: Payer: Self-pay | Admitting: Cardiology

## 2019-02-19 ENCOUNTER — Ambulatory Visit (INDEPENDENT_AMBULATORY_CARE_PROVIDER_SITE_OTHER): Payer: Medicare Other | Admitting: Cardiology

## 2019-02-19 ENCOUNTER — Other Ambulatory Visit: Payer: Self-pay

## 2019-02-19 VITALS — BP 148/88 | HR 77 | Ht 61.5 in | Wt 156.4 lb

## 2019-02-19 DIAGNOSIS — R601 Generalized edema: Secondary | ICD-10-CM | POA: Diagnosis not present

## 2019-02-19 DIAGNOSIS — I10 Essential (primary) hypertension: Secondary | ICD-10-CM

## 2019-02-19 DIAGNOSIS — I2510 Atherosclerotic heart disease of native coronary artery without angina pectoris: Secondary | ICD-10-CM | POA: Diagnosis not present

## 2019-02-19 MED ORDER — AMLODIPINE BESYLATE 5 MG PO TABS: 5.0000 mg | ORAL_TABLET | Freq: Every day | ORAL | 3 refills | Status: DC

## 2019-02-19 NOTE — Patient Instructions (Signed)
Medication Instructions:  START AMLODIPINE 5 MG ONCE DAILY  If you need a refill on your cardiac medications before your next appointment, please call your pharmacy.   Lab work: Your physician recommends that you HAVE LAB WORK TODAY If you have labs (blood work) drawn today and your tests are completely normal, you will receive your results only by: Marland Kitchen MyChart Message (if you have MyChart) OR . A paper copy in the mail If you have any lab test that is abnormal or we need to change your treatment, we will call you to review the results.  Follow-Up: At [facility], you and your health needs are our priority.  As part of our continuing mission to provide you with exceptional heart care, we have created designated Provider Care Teams.  These Care Teams include your primary Cardiologist (physician) and Advanced Practice Providers (APPs -  Physician Assistants and Nurse Practitioners) who all work together to provide you with the care you need, when you need it. Your physician wants you to follow-up in: [place] will receive a reminder letter in the mail two months in advance. If you don't receive a letter, please call our office to schedule the follow-up appointment.

## 2019-02-20 ENCOUNTER — Telehealth: Payer: Self-pay | Admitting: *Deleted

## 2019-02-20 DIAGNOSIS — R601 Generalized edema: Secondary | ICD-10-CM

## 2019-02-20 LAB — BASIC METABOLIC PANEL
BUN/Creatinine Ratio: 6 — ABNORMAL LOW (ref 9–23)
BUN: 4 mg/dL — ABNORMAL LOW (ref 6–24)
CO2: 25 mmol/L (ref 20–29)
Calcium: 9.2 mg/dL (ref 8.7–10.2)
Chloride: 105 mmol/L (ref 96–106)
Creatinine, Ser: 0.71 mg/dL (ref 0.57–1.00)
GFR calc Af Amer: 110 mL/min/{1.73_m2} (ref >59)
GFR calc non Af Amer: 96 mL/min/{1.73_m2} (ref >59)
Glucose: 90 mg/dL (ref 65–99)
Potassium: 3.6 mmol/L (ref 3.5–5.2)
Sodium: 143 mmol/L (ref 134–144)

## 2019-02-20 NOTE — Telephone Encounter (Addendum)
-----   Message from Lelon Perla, MD sent at 02/20/2019  7:20 AM EDT ----- No change in meds [facility] with pt, aware of results. She does not feel comfortable about waiting 6 months. She is not able to recognize the symptoms of low or elevated potassium due to her MS. Will send her paperwork for recheck in 3 months. Lab orders mailed to the pt

## 2019-02-24 ENCOUNTER — Other Ambulatory Visit: Payer: Self-pay | Admitting: Neurology

## 2019-02-26 ENCOUNTER — Ambulatory Visit (INDEPENDENT_AMBULATORY_CARE_PROVIDER_SITE_OTHER): Payer: Medicare Other | Admitting: Neurology

## 2019-02-26 ENCOUNTER — Encounter: Payer: Self-pay | Admitting: Neurology

## 2019-02-26 ENCOUNTER — Other Ambulatory Visit: Payer: Self-pay

## 2019-02-26 VITALS — BP 141/93 | HR 106 | Temp 97.9°F | Ht 61.0 in | Wt 153.0 lb

## 2019-02-26 DIAGNOSIS — R269 Unspecified abnormalities of gait and mobility: Secondary | ICD-10-CM

## 2019-02-26 DIAGNOSIS — G35 Multiple sclerosis: Secondary | ICD-10-CM

## 2019-02-26 MED ORDER — METHYLPHENIDATE HCL 20 MG PO TABS: 20.0000 mg | ORAL_TABLET | Freq: Every day | ORAL | 0 refills | Status: DC

## 2019-02-26 NOTE — Progress Notes (Signed)
Reason for visit: Multiple sclerosis  Summer Henderson is an 58 y.o. female  History of present illness:  Summer Henderson is a 58 year old right-handed white female with history of multiple sclerosis.  The patient has had to stop Tysabri as she had developed JC viral antibodies.  She was to go on Ocrevus in June 2020, but she had left shoulder surgery but had complications of bruising and bleeding following the surgery.  The patient has not been on any disease modifying agents in at least 8 or 9 months.  The patient has had some worsening in her walking and balance, she has had occasional falls.  She does not use a cane for ambulation.  She denies any new vision complaints or any new numbness or weakness of the face, arms, or legs.  She has had some issues with depression.  She returns to the office today for an evaluation.  Past Medical History:  Diagnosis Date   Anxiety    Arthritis    CAD (coronary artery disease)    a. 06/2014 abnl MV;  b. 06/2014 Cath: LM nl, LAD 70m (2.75x28 Promus DES), LCX nl, RCA 60-74m (FFR = 0.80 -> 3.0x24 Promus DES), EF 55-65%.   Carpal tunnel syndrome    Cervical dysplasia    Chronic fatigue    Depression    GERD (gastroesophageal reflux disease)    Hypertension    Mitral valve prolapse    Multiple sclerosis (HCC)    Pneumonia    Prolonged QT interval    RLS (restless legs syndrome) 04/28/2015    Past Surgical History:  Procedure Laterality Date   ABDOMINAL HYSTERECTOMY     BICEPS TENDON REPAIR     bladder resuspension     CARPAL TUNNEL RELEASE     CERVICAL SPINE SURGERY  10-16-2009   CORONARY ANGIOPLASTY WITH STENT PLACEMENT     DILATION AND CURETTAGE OF UTERUS     FOOT SURGERY Bilateral    For bunions   HAMMER TOE SURGERY Left    LEFT HEART CATHETERIZATION WITH CORONARY ANGIOGRAM N/A 06/29/2014   Procedure: LEFT HEART CATHETERIZATION WITH CORONARY ANGIOGRAM;  Surgeon: Blane Ohara, MD;  Location: [facility] CATH LAB;  Service:  Cardiovascular;  Laterality: N/A;   ORIF TOE FRACTURE Right    Prolong QTC     Heart   rectovaginal fistula repair     ROTATOR CUFF REPAIR Left     Family History  Problem Relation Age of Onset   Melanoma Mother    Colon polyps Mother    Diabetes Mother    Breast cancer Maternal Grandmother    Colon cancer Maternal Grandmother    Brain cancer Maternal Grandfather    Lung cancer Maternal Grandfather    Diabetes Maternal Grandfather    Kidney disease Neg Hx    Esophageal cancer Neg Hx    Gallbladder disease Neg Hx     Social history:  reports that she has been smoking cigarettes. She has a 17.50 pack-year smoking history. She has never used smokeless tobacco. She reports that she does not drink alcohol or use drugs.    Allergies  Allergen Reactions   Demerol [Meperidine] Other (See Comments)    [derm] rash, swelling, itching Other reaction(s): Other (See Comments) [derm] rash, swelling, itching   Erythromycin    Morphine And Related    Sulfa Antibiotics    Diflucan [Fluconazole] Other (See Comments)    States she has prolonged QT and cannot take this.   Other  Other reaction(s): Other (See Comments) Z-pak causes EKG changes, was told by cardiologist not to take this.   Sulfacetamide Sodium    Erythromycin Base Rash    [Derm] rash   Fesoterodine Palpitations   Tape Other (See Comments)    Adhesive tape And Steri Strips; Local reaction-burns skin   Toviaz [Fesoterodine Fumarate Er] Palpitations    Medications:  Prior to Admission medications   Medication Sig Start Date End Date Taking? Authorizing Provider  amLODipine (NORVASC) 5 MG tablet Take 1 tablet (5 mg total) by mouth daily. 02/19/19 05/20/19 Yes Lewayne Bunting, MD  aspirin 81 MG tablet Take 1 tablet (81 mg total) by mouth daily. 07/24/14  Yes Lewayne Bunting, MD  atorvastatin (LIPITOR) 80 MG tablet Take 1 tablet (80 mg total) by mouth daily at 6 PM. 06/20/18  Yes Crenshaw,  Madolyn Frieze, MD  clonazepam (KLONOPIN) 0.125 MG disintegrating tablet Take 0.125 mg by mouth as directed.   Yes [provider]  DULoxetine (CYMBALTA) 60 MG capsule TAKE 1 CAPSULE (60 MG TOTAL) BY MOUTH 2 (TWO) TIMES DAILY. 02/24/19  Yes York Spaniel, MD  ENABLEX 7.5 MG 24 hr tablet Take 1 tablet by mouth daily. 06/18/14  Yes [provider]  esomeprazole (NEXIUM) 40 MG capsule TAKE 1 CAPSULE (40 MG TOTAL) BY MOUTH DAILY AT 12 NOON. 08/29/18  Yes Crenshaw, Madolyn Frieze, MD  furosemide (LASIX) 20 MG tablet Take 1 tablet (20 mg total) by mouth daily. 01/09/19 04/09/19 Yes Lewayne Bunting, MD  gabapentin (NEURONTIN) 100 MG capsule Take 1 capsule (100 mg total) by mouth 3 (three) times daily. Patient taking differently: Take 100 mg by mouth daily as needed.  02/29/16  Yes York Spaniel, MD  hydrochlorothiazide (MICROZIDE) 12.5 MG capsule Take 12.5 mg by mouth daily.   Yes [provider]  methocarbamol (ROBAXIN) 500 MG tablet Take 500 mg by mouth every 8 (eight) hours as needed for muscle spasms.   Yes [provider]  methylphenidate (RITALIN) 20 MG tablet Take 1 tablet (20 mg total) by mouth daily. At noon 01/27/19  Yes York Spaniel, MD  metoprolol succinate (TOPROL-XL) 50 MG 24 hr tablet Take 1 tablet (50 mg total) by mouth daily. 06/19/18  Yes Lewayne Bunting, MD  modafinil (PROVIGIL) 200 MG tablet Take 1 tablet (200 mg total) by mouth daily. 01/27/19  Yes York Spaniel, MD  NASONEX 50 MCG/ACT nasal spray Place 50 sprays into the nose daily. 09/16/12  Yes [provider]  potassium chloride (K-DUR) 10 MEQ tablet Take 1 tablet (10 mEq total) by mouth daily. 01/09/19 04/09/19 Yes Lewayne Bunting, MD  losartan (COZAAR) 100 MG tablet Take 1 tablet (100 mg total) by mouth daily. 06/19/18 02/19/19  Lewayne Bunting, MD    ROS:  Out of a complete 14 system review of symptoms, the patient complains only of the following symptoms, and all other reviewed  systems are negative.  Walking difficulty Swelling in the legs  Blood pressure (!) 141/93, pulse (!) 106, temperature 97.9 F (36.6 C), temperature source Temporal, height 5\' 1"  (1.549 m), weight 153 lb (69.4 kg).  Physical Exam  General: The patient is alert and cooperative at the time of the examination.  Skin: No significant peripheral edema is noted.   Neurologic Exam  Mental status: The patient is alert and oriented x 3 at the time of the examination. The patient has apparent normal recent and remote memory, with an apparently normal attention span  and concentration ability.   Cranial nerves: Facial symmetry is present. Speech is normal, no aphasia or dysarthria is noted. Extraocular movements are full. Visual fields are full.  Pupils are equal, round, and reactive to light.  Discs are flat bilaterally.  Motor: The patient has good strength in all 4 extremities.  Sensory examination: Soft touch sensation is symmetric on the face, arms, and legs.  Coordination: The patient has good finger-nose-finger and heel-to-shin bilaterally.  Gait and station: The patient has an unstable gait, she can walk independently.  Tandem gait is very unsteady.  Romberg is negative but is unsteady.  Reflexes: Deep tendon reflexes are symmetric.   Assessment/Plan:  1.  Multiple sclerosis  2.  Gait disturbance  The patient should be ready to start Ocrevus at this point, I will give her name to the infusion nurse.  She will follow-up here in 6 months.  She is encouraged to use a cane for ambulation when outside of the house.  Marlan Palau MD 02/26/2019 12:59 PM  [facility] [address] [place], [place] [address]  Phone [phone] Fax [phone]

## 2019-04-07 ENCOUNTER — Other Ambulatory Visit: Payer: Self-pay | Admitting: Neurology

## 2019-04-07 ENCOUNTER — Other Ambulatory Visit: Payer: Self-pay | Admitting: Cardiology

## 2019-04-07 DIAGNOSIS — G35 Multiple sclerosis: Secondary | ICD-10-CM

## 2019-04-07 MED ORDER — METHYLPHENIDATE HCL 20 MG PO TABS: 20.0000 mg | ORAL_TABLET | Freq: Every day | ORAL | 0 refills | Status: DC

## 2019-04-17 ENCOUNTER — Telehealth: Payer: Self-pay | Admitting: Neurology

## 2019-04-17 NOTE — Telephone Encounter (Signed)
[email or web address]  has called asking for RN Jinny Blossom re: a referral that was sent over for pt.  Verdis Frederickson is asking for a call on the infusion line @ [phone] or on her cell phone [phone]

## 2019-04-17 NOTE — Telephone Encounter (Signed)
Pt will hopefully be received ocrevus in home through [facility].  I submitted clinical information this am to fax # [phone]. I reached back out to Lelan Pons and she requested I sent the most recent o/v note has well.  I have submitted the requested information.  Burlene Arnt will be in touch with further information as well.

## 2019-04-18 NOTE — Telephone Encounter (Signed)
Received orders from [facility] for the ocrevus home infusion. Dr. Jannifer Franklin signed and I have faxed the orders to [phone]. Confirmation has been received.

## 2019-04-28 MED ORDER — CLOTRIMAZOLE 10 MG MT TROC: 10.0000 mg | Freq: Three times a day (TID) | OROMUCOSAL | 0 refills | Status: DC

## 2019-04-28 NOTE — Telephone Encounter (Addendum)
Received a call from [facility] with [facility]. She is in the process of setting the pt up for her home infusion of ocrevus. While speaking with the pt about her appointment the pt expressed concerns on taking the pre med prednisone. Pt expressed to [facility] in the past prednisone has caused terrible yeast infections of the mouth for her.  Pt really does not want to take the prednisone but was opened to it if we could rx a med to help with the oral yeast infection.   Estill Bamberg call back # is [phone].

## 2019-04-28 NOTE — Telephone Encounter (Signed)
I called and talk with Estill Bamberg.  The patient will be given a small prescription for clotrimazole oral tablets for oral thrush.

## 2019-04-28 NOTE — Addendum Note (Signed)
Addended by: Kathrynn Ducking on: 04/28/2019 03:12 PM   Modules accepted: Orders

## 2019-05-14 ENCOUNTER — Other Ambulatory Visit: Payer: Self-pay | Admitting: *Deleted

## 2019-05-14 DIAGNOSIS — G35 Multiple sclerosis: Secondary | ICD-10-CM

## 2019-05-15 ENCOUNTER — Telehealth: Payer: Self-pay | Admitting: Neurology

## 2019-05-15 MED ORDER — METHYLPHENIDATE HCL 20 MG PO TABS: 20.0000 mg | ORAL_TABLET | Freq: Every day | ORAL | 0 refills | Status: DC

## 2019-05-15 NOTE — Telephone Encounter (Signed)
Patient had new insurance go into effect for new year. She has received the 1st dose and is due for the 2nd dose 300 mg. I have completed PA paperwork that was faxed to be completed and have faxed it back [facility] to submit for the pt.

## 2019-05-16 ENCOUNTER — Other Ambulatory Visit: Payer: Self-pay | Admitting: Cardiology

## 2019-05-20 NOTE — Telephone Encounter (Addendum)
I reached out to [place]. We received the response from [facility] on the PA for ocrevus.  Patient was denied coverage through part D but approved through part B until 05/07/20  Essentially the is approved for the medication but can only receive from a short stay clinic of physicians office, she cannot receive through home health. Pt just received her first ocrevus infusion on 05/07/2019 through home health agency. I advised Marchelle Folks on this information and she verbalized understanding.  She requested I fax the denial from [facility] to her at # [phone]. I have faxed. Marchelle Folks sts she is going to call [employer] and see if an appeal could be made or other options.   Marchelle Folks will keep [place] up to date on this.

## 2019-05-20 NOTE — Telephone Encounter (Signed)
[facility] a notification from  [facility] stating that "after receiving new information from your provider about a recent request for ocrevus, it was determined there is no longer a need for an authorization and your request for an authorization has been withdrawn"

## 2019-05-20 NOTE — Telephone Encounter (Signed)
Marchelle Folks [employer] has called asking this message be sent as a high priotity to [place], Charity fundraiser.  [email or web address] states a fax was sent from Cover My Meds  That the provider(Dr Anne Hahn) needs to sign and click submit to plan(grey button at bottom of the page) Pt is due for infusion on tomorrow, they are unable to get approved until dr signs off and approves.  Marchelle Folks states this needs to be done ASAP she can be reached at [phone].  Marchelle Folks states the reason for the change is due to pt having new insurance as of 05-09-19

## 2019-05-20 NOTE — Telephone Encounter (Signed)
AVW:[id number]. PA sent to [facility]. Will wait for determination

## 2019-06-10 ENCOUNTER — Encounter: Payer: Self-pay | Admitting: *Deleted

## 2019-06-17 ENCOUNTER — Other Ambulatory Visit: Payer: Self-pay

## 2019-06-17 DIAGNOSIS — G35 Multiple sclerosis: Secondary | ICD-10-CM

## 2019-06-17 NOTE — Telephone Encounter (Signed)
1) Medication(s) Requested (by name): methylphenidate (RITALIN) 20 MG table  2) Pharmacy of Choice: [id number] [place] - [address], [address] AT [facility] [address]  [address], [address], [place] [address]

## 2019-06-18 MED ORDER — METHYLPHENIDATE HCL 20 MG PO TABS: 20.0000 mg | ORAL_TABLET | Freq: Every day | ORAL | 0 refills | Status: DC

## 2019-06-18 NOTE — Telephone Encounter (Signed)
Check drug registry last refill May 15, 2019. Please refill for Dr. Anne Hahn thanks

## 2019-06-18 NOTE — Telephone Encounter (Signed)
Refill done for pt by Dr.SEthi.

## 2019-07-03 ENCOUNTER — Other Ambulatory Visit: Payer: Self-pay | Admitting: Cardiology

## 2019-07-03 DIAGNOSIS — E785 Hyperlipidemia, unspecified: Secondary | ICD-10-CM

## 2019-07-10 ENCOUNTER — Telehealth: Payer: Self-pay | Admitting: Neurology

## 2019-07-10 NOTE — Telephone Encounter (Signed)
The patient had an ophthalmologic evaluation through Dr. Alben Spittle on 04 February 2019.  The patient is on Ocrevus currently.  No disc edema or pallor was noted.  Some degree of hypertensive retinopathy was noted.

## 2019-07-24 ENCOUNTER — Other Ambulatory Visit: Payer: Self-pay | Admitting: Neurology

## 2019-07-24 DIAGNOSIS — G35 Multiple sclerosis: Secondary | ICD-10-CM

## 2019-07-24 MED ORDER — METHYLPHENIDATE HCL 20 MG PO TABS: 20.0000 mg | ORAL_TABLET | Freq: Every day | ORAL | 0 refills | Status: DC

## 2019-07-24 NOTE — Telephone Encounter (Signed)
Refill sent to Dr. Anne Hahn.

## 2019-07-24 NOTE — Telephone Encounter (Signed)
Pt called needing a refill on her methylphenidate (RITALIN) 20 MG tablet [facility] [address] [facility]

## 2019-08-12 ENCOUNTER — Other Ambulatory Visit: Payer: Self-pay | Admitting: Cardiology

## 2019-08-20 ENCOUNTER — Other Ambulatory Visit: Payer: Self-pay | Admitting: Cardiology

## 2019-08-20 ENCOUNTER — Other Ambulatory Visit: Payer: Self-pay | Admitting: Neurology

## 2019-09-01 ENCOUNTER — Other Ambulatory Visit: Payer: Self-pay | Admitting: Neurology

## 2019-09-01 DIAGNOSIS — G35 Multiple sclerosis: Secondary | ICD-10-CM

## 2019-09-01 MED ORDER — METHYLPHENIDATE HCL 20 MG PO TABS: 20.0000 mg | ORAL_TABLET | Freq: Every day | ORAL | 0 refills | Status: DC

## 2019-09-01 NOTE — Telephone Encounter (Signed)
A prescription for Ritalin was sent in.

## 2019-09-01 NOTE — Telephone Encounter (Signed)
Pt has called for a refill on her methylphenidate (RITALIN) 20 MG tablet [facility] [phone]

## 2019-09-01 NOTE — Telephone Encounter (Signed)
Drug registry last refill 07/24/2019.

## 2019-09-01 NOTE — Addendum Note (Signed)
Addended by: York Spaniel on: 09/01/2019 06:20 PM   Modules accepted: Orders

## 2019-09-02 ENCOUNTER — Telehealth: Payer: Self-pay

## 2019-09-02 ENCOUNTER — Other Ambulatory Visit: Payer: Self-pay

## 2019-09-02 MED ORDER — OCREVUS 300 MG/10ML IV SOLN: 600.0000 mg | Freq: Once | INTRAVENOUS | 1 refills | Status: AC

## 2019-09-02 NOTE — Telephone Encounter (Signed)
I spoke with Summer Henderson with [employer] foundation at  [phone]. I stated pt is currently getting her ocrevus at home with another pharmacy and is needing to get it for free or some pt assistance. He stated if pt wants assistance with medication she would have to get medication with their foundation and would not be able to use [facility].He explained pt will have to filled out the enrollment form and sign, and MD would have to complete the provider form.

## 2019-09-02 NOTE — Telephone Encounter (Signed)
I receive call from pt that we are trying to get her free pt assistance with ocrevus through [employer]. I got her family household and estimation income. I stated she will continue to receive at home but get meds with this foundation. I fax form, med list, prescription insurance card, and demographic sheet to [phone] once and confirmed.

## 2019-09-02 NOTE — Telephone Encounter (Signed)
I called [facility] who does pt home infusion. I explain leanne stated pt would need to get approve for free ocrevus at home because of a high co payment. Marchelle Folks RN who does the PA stated they dont do patient assistance for the pt and to try [employer].

## 2019-09-02 NOTE — Telephone Encounter (Signed)
Infusion nurse Alesia Banda stated pt is receiving Ocrevus at home with [facility] and will have a high copayment. She recommend to use pt gets it for free with the [employer] patient foundation.

## 2019-09-03 NOTE — Telephone Encounter (Signed)
I refax new format of patient enrollment form and pre scriber foundation form twice to [phone] along with office notes, demographic sheet, prescription, med list allergy list twice and confirmed.

## 2019-09-03 NOTE — Telephone Encounter (Signed)
I called [employer] patient foundation back because they sent pt enrollment form and prescriber form that was already fax. They stated I fax the old version of the form and they need both forms of newer version filled out again and fax back. I stated pt is home bound and takes care of her disable son and will need the authorization formed mailed to her. They stated I can call pt to giver verbal consent to sign form and documented it and dated if she is unable to come in office.

## 2019-09-03 NOTE — Telephone Encounter (Signed)
I called pt and explain I need verbal consent to fax in the enrollment form for home ocrevus infusion.Pt gave RN verbal consent to send in enrollment form for pt foundation of ocrevus at home.

## 2019-09-04 ENCOUNTER — Encounter: Payer: Self-pay | Admitting: Neurology

## 2019-09-04 ENCOUNTER — Other Ambulatory Visit: Payer: Self-pay

## 2019-09-04 ENCOUNTER — Ambulatory Visit (INDEPENDENT_AMBULATORY_CARE_PROVIDER_SITE_OTHER): Payer: Medicare PPO | Admitting: Neurology

## 2019-09-04 VITALS — BP 122/74 | HR 77 | Temp 97.6°F | Wt 164.5 lb

## 2019-09-04 DIAGNOSIS — Z5181 Encounter for therapeutic drug level monitoring: Secondary | ICD-10-CM | POA: Diagnosis not present

## 2019-09-04 DIAGNOSIS — R269 Unspecified abnormalities of gait and mobility: Secondary | ICD-10-CM | POA: Diagnosis not present

## 2019-09-04 DIAGNOSIS — G35 Multiple sclerosis: Secondary | ICD-10-CM

## 2019-09-04 NOTE — Progress Notes (Signed)
Reason for visit: Multiple sclerosis, gait disorder  Summer Henderson is an 59 y.o. female  History of present illness:  Summer Henderson is a 59 year old right-handed white female with history of multiple sclerosis, secondary progressive.  The patient is on Ocrevus, she is tolerating the medication well, her next injection is due in June 2021.  The patient has gotten her Covid virus vaccination.  She has a neurogenic bladder, she has Botox injections for this and has to do in and out catheterizations.  She does have some weakness in the legs, she reports a gait disorder, her last fall was around Thanksgiving of 2020.  The patient has not had any other new symptoms with numbness or weakness or with vision changes.  The patient returns for an evaluation.  Past Medical History:  Diagnosis Date  . Anxiety   . Arthritis   . CAD (coronary artery disease)    a. 06/2014 abnl MV;  b. 06/2014 Cath: LM nl, LAD 34m (2.75x28 Promus DES), LCX nl, RCA 60-34m (FFR = 0.80 -> 3.0x24 Promus DES), EF 55-65%.  . Carpal tunnel syndrome   . Cervical dysplasia   . Chronic fatigue   . Depression   . GERD (gastroesophageal reflux disease)   . Hypertension   . Mitral valve prolapse   . Multiple sclerosis (HCC)   . Pneumonia   . Prolonged QT interval   . RLS (restless legs syndrome) 04/28/2015    Past Surgical History:  Procedure Laterality Date  . ABDOMINAL HYSTERECTOMY    . BICEPS TENDON REPAIR    . bladder resuspension    . CARPAL TUNNEL RELEASE    . CERVICAL SPINE SURGERY  10-16-2009  . CORONARY ANGIOPLASTY WITH STENT PLACEMENT    . DILATION AND CURETTAGE OF UTERUS    . FOOT SURGERY Bilateral    For bunions  . HAMMER TOE SURGERY Left   . LEFT HEART CATHETERIZATION WITH CORONARY ANGIOGRAM N/A 06/29/2014   Procedure: LEFT HEART CATHETERIZATION WITH CORONARY ANGIOGRAM;  Surgeon: Micheline Chapman, MD;  [facility] CATH [facility];  Service: Cardiovascular;  Laterality: N/A;  . ORIF TOE FRACTURE Right   . Prolong QTC      Heart  . rectovaginal fistula repair    . ROTATOR CUFF REPAIR Left     Family History  Problem Relation Age of Onset  . Melanoma Mother   . Colon polyps Mother   . Diabetes Mother   . Breast cancer Maternal Grandmother   . Colon cancer Maternal Grandmother   . Brain cancer Maternal Grandfather   . Lung cancer Maternal Grandfather   . Diabetes Maternal Grandfather   . Kidney disease Neg Hx   . Esophageal cancer Neg Hx   . Gallbladder disease Neg Hx     Social history:  reports that she has been smoking cigarettes. She has a 17.50 pack-year smoking history. She has never used smokeless tobacco. She reports that she does not drink alcohol or use drugs.    Allergies  Allergen Reactions  . Demerol [Meperidine] Other (See Comments)    [derm] rash, swelling, itching Other reaction(s): Other (See Comments) [derm] rash, swelling, itching  . Erythromycin   . Morphine And Related   . Sulfa Antibiotics   . Diflucan [Fluconazole] Other (See Comments)    States she has prolonged QT and cannot take this.  . Other     Other reaction(s): Other (See Comments) Z-pak causes EKG changes, was told by cardiologist not to take this.  Marland Kitchen  Sulfacetamide Sodium   . Erythromycin Base Rash    [Derm] rash  . Fesoterodine Palpitations  . Tape Other (See Comments)    Adhesive tape And Steri Strips; Local reaction-burns skin  . Toviaz [Fesoterodine Fumarate Er] Palpitations    Medications:  Prior to Admission medications   Medication Sig Start Date End Date Taking? Authorizing Provider  aspirin 81 MG tablet Take 1 tablet (81 mg total) by mouth daily. 07/24/14  Yes Lewayne Bunting, MD  atorvastatin (LIPITOR) 80 MG tablet TAKE 1 TABLET (80 MG TOTAL) BY MOUTH DAILY AT 6 PM. 07/03/19  Yes Crenshaw, Madolyn Frieze, MD  clonazepam (KLONOPIN) 0.125 MG disintegrating tablet Take 0.125 mg by mouth as directed.   Yes [provider]  clotrimazole (MYCELEX) 10 MG troche Take 1 tablet (10 mg total) by  mouth 3 (three) times daily. 04/28/19  Yes York Spaniel, MD  DULoxetine (CYMBALTA) 60 MG capsule TAKE 1 CAPSULE (60 MG TOTAL) BY MOUTH 2 (TWO) TIMES DAILY. 08/20/19  Yes York Spaniel, MD  ENABLEX 7.5 MG 24 hr tablet Take 1 tablet by mouth daily. 06/18/14  Yes [provider]  esomeprazole (NEXIUM) 40 MG capsule TAKE 1 CAPSULE (40 MG TOTAL) BY MOUTH DAILY AT 12 NOON. 08/21/19  Yes Lewayne Bunting, MD  furosemide (LASIX) 20 MG tablet Take 1 tablet (20 mg total) by mouth daily. 01/09/19 09/04/19 Yes Lewayne Bunting, MD  gabapentin (NEURONTIN) 100 MG capsule Take 1 capsule (100 mg total) by mouth 3 (three) times daily. Patient taking differently: Take 100 mg by mouth daily as needed.  02/29/16  Yes York Spaniel, MD  hydrochlorothiazide (MICROZIDE) 12.5 MG capsule Take 12.5 mg by mouth daily.   Yes [provider]  losartan (COZAAR) 100 MG tablet TAKE 1 TABLET BY MOUTH EVERY DAY 08/14/19  Yes Crenshaw, Madolyn Frieze, MD  methocarbamol (ROBAXIN) 500 MG tablet Take 500 mg by mouth every 8 (eight) hours as needed for muscle spasms.   Yes [provider]  methylphenidate (RITALIN) 20 MG tablet Take 1 tablet (20 mg total) by mouth daily. At noon 09/01/19  Yes York Spaniel, MD  metoprolol succinate (TOPROL-XL) 50 MG 24 hr tablet TAKE 1 TABLET BY MOUTH EVERY DAY 05/16/19  Yes Lewayne Bunting, MD  modafinil (PROVIGIL) 200 MG tablet Take 1 tablet (200 mg total) by mouth daily. 01/27/19  Yes York Spaniel, MD  NASONEX 50 MCG/ACT nasal spray Place 50 sprays into the nose daily. 09/16/12  Yes [provider]  ocrelizumab 600 mg in sodium chloride 0.9 % 500 mL Inject 600 mg into the vein every 6 (six) months.   Yes [provider]  potassium chloride (K-DUR) 10 MEQ tablet Take 1 tablet (10 mEq total) by mouth daily. 01/09/19 09/04/19 Yes Lewayne Bunting, MD  amLODipine (NORVASC) 5 MG tablet Take 1 tablet (5 mg total) by mouth daily. 02/19/19 05/20/19  Lewayne Bunting, MD    ROS:  Out of a complete 14 system review of symptoms, the patient complains only of the following symptoms, and all other reviewed systems are negative.  Walking difficulty Fatigue Leg weakness Bladder control problems  Blood pressure 122/74, pulse 77, temperature 97.6 F (36.4 C), weight 164 lb 8 oz (74.6 kg).  Physical Exam  General: The patient is alert and cooperative at the time of the examination.  The patient is moderately obese.  Skin: No significant peripheral edema is noted.   Neurologic Exam  Mental status:  The patient is alert and oriented x 3 at the time of the examination. The patient has apparent normal recent and remote memory, with an apparently normal attention span and concentration ability.   Cranial nerves: Facial symmetry is present. Speech is normal, no aphasia or dysarthria is noted. Extraocular movements are full. Visual fields are full.  Motor: The patient has good strength in all 4 extremities.  Sensory examination: Soft touch sensation is symmetric on the face, arms, and legs.  Coordination: The patient has good finger-nose-finger and heel-to-shin bilaterally.  Gait and station: The patient has a wide-based, unsteady gait.  Tandem gait is very unsteady.  Romberg positive, the patient will fall backwards.  When walking out to get blood work however, the patient's ability to walk seems to be much better, she is able to walk without staggering but the gait is still slightly wide-based.  Reflexes: Deep tendon reflexes are symmetric.   Assessment/Plan:  1.  Multiple sclerosis  2.  Gait disorder  3.  Neurogenic bladder  The patient will be set up for physical therapy for her gait.  She will continue the Ocrevus, she will have blood work done today, she will follow-up in 6 months.  Jill Alexanders MD 09/04/2019 2:49 PM  [facility] [address] [place], [place] [address]  Phone [phone]  Fax [phone]

## 2019-09-07 LAB — CBC WITH DIFFERENTIAL/PLATELET
Basophils Absolute: 0 10*3/uL (ref 0.0–0.2)
Basos: 0 %
EOS (ABSOLUTE): 0.1 10*3/uL (ref 0.0–0.4)
Eos: 1 %
Hematocrit: 39.6 % (ref 34.0–46.6)
Hemoglobin: 12.9 g/dL (ref 11.1–15.9)
Immature Grans (Abs): 0 10*3/uL (ref 0.0–0.1)
Immature Granulocytes: 0 %
Lymphocytes Absolute: 2.5 10*3/uL (ref 0.7–3.1)
Lymphs: 24 %
MCH: 28.7 pg (ref 26.6–33.0)
MCHC: 32.6 g/dL (ref 31.5–35.7)
MCV: 88 fL (ref 79–97)
Monocytes Absolute: 0.6 10*3/uL (ref 0.1–0.9)
Monocytes: 6 %
Neutrophils Absolute: 7.1 10*3/uL — ABNORMAL HIGH (ref 1.4–7.0)
Neutrophils: 69 %
Platelets: 476 10*3/uL — ABNORMAL HIGH (ref 150–450)
RBC: 4.49 x10E6/uL (ref 3.77–5.28)
RDW: 14.4 % (ref 11.7–15.4)
WBC: 10.4 10*3/uL (ref 3.4–10.8)

## 2019-09-07 LAB — COMPREHENSIVE METABOLIC PANEL
ALT: 13 IU/L (ref 0–32)
AST: 16 IU/L (ref 0–40)
Albumin/Globulin Ratio: 1.7 (ref 1.2–2.2)
Albumin: 4.2 g/dL (ref 3.8–4.9)
Alkaline Phosphatase: 208 IU/L — ABNORMAL HIGH (ref 39–117)
BUN/Creatinine Ratio: 13 (ref 9–23)
BUN: 10 mg/dL (ref 6–24)
Bilirubin Total: 0.3 mg/dL (ref 0.0–1.2)
CO2: 25 mmol/L (ref 20–29)
Calcium: 9.5 mg/dL (ref 8.7–10.2)
Chloride: 100 mmol/L (ref 96–106)
Creatinine, Ser: 0.75 mg/dL (ref 0.57–1.00)
GFR calc Af Amer: 102 mL/min/{1.73_m2} (ref >59)
GFR calc non Af Amer: 89 mL/min/{1.73_m2} (ref >59)
Globulin, Total: 2.5 g/dL (ref 1.5–4.5)
Glucose: 64 mg/dL — ABNORMAL LOW (ref 65–99)
Potassium: 4.2 mmol/L (ref 3.5–5.2)
Sodium: 138 mmol/L (ref 134–144)
Total Protein: 6.7 g/dL (ref 6.0–8.5)

## 2019-09-07 LAB — CD19 AND CD20, FLOW CYTOMETRY

## 2019-09-11 NOTE — Telephone Encounter (Signed)
I receive a fax from [place] [employer] that they are unable to get in contact with pt. I call pt and left vm and left their number to call about pt assistance for ocrevus to be continue at home with pt assistance. I also sent pt a mychart message to contact them. I stated the case will be closed in three days if she does not contact them.

## 2019-09-22 NOTE — Telephone Encounter (Signed)
Fax approval from [facility] to [facility] at [phone] for pt to continue home infusion.

## 2019-09-22 NOTE — Telephone Encounter (Signed)
Receive fax from [employer] that authorization status is approve effective 05/21/2019 to 05/07/2020. Authorization number is [id number].

## 2019-10-07 ENCOUNTER — Other Ambulatory Visit: Payer: Self-pay | Admitting: Neurology

## 2019-10-07 DIAGNOSIS — G35 Multiple sclerosis: Secondary | ICD-10-CM

## 2019-10-07 MED ORDER — METHYLPHENIDATE HCL 20 MG PO TABS: 20.0000 mg | ORAL_TABLET | Freq: Every day | ORAL | 0 refills | Status: DC

## 2019-10-07 NOTE — Addendum Note (Signed)
Addended by: Hildred Alamin on: 10/07/2019 04:57 PM   Modules accepted: Orders

## 2019-10-07 NOTE — Telephone Encounter (Signed)
Drug registry check last refill September 03, 2019.

## 2019-10-07 NOTE — Telephone Encounter (Signed)
1) Medication(s) Requested (by name): methylphenidate (RITALIN) 20 MG tablet   2) Pharmacy [facility]: [id number] [place], [place] - [address] AT [facility] [address]  [address], [address], [place] [id number]

## 2019-10-08 ENCOUNTER — Other Ambulatory Visit: Payer: Self-pay | Admitting: Cardiology

## 2019-10-08 DIAGNOSIS — R601 Generalized edema: Secondary | ICD-10-CM

## 2019-10-10 ENCOUNTER — Telehealth: Payer: Self-pay | Admitting: Cardiology

## 2019-10-10 DIAGNOSIS — R601 Generalized edema: Secondary | ICD-10-CM

## 2019-10-10 MED ORDER — POTASSIUM CHLORIDE ER 10 MEQ PO TBCR: 10.0000 meq | EXTENDED_RELEASE_TABLET | Freq: Every day | ORAL | 2 refills | Status: DC

## 2019-10-10 NOTE — Telephone Encounter (Signed)
Refill sent to the pharmacy electronically.  

## 2019-10-10 NOTE — Telephone Encounter (Signed)
New message   Patient needs a new prescription for potassium chloride (KLOR-CON) 10 MEQ tablet sent to [id number] - [place], [place] - [address], [address] AT [facility] [address]

## 2019-10-23 NOTE — Progress Notes (Deleted)
HPI: FU CAD, hypertension, prolonged QT and mitral valve prolapse. Holter monitor in September of 2012 showed sinus rhythm with rare PVC. Patient had an abdominal CT for abdominal pain in December 2015. 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 Nuclear study January 2016 showed an ejection fraction of 56%, probable mild breast attenuation with possible mild mid anteroseptal ischemia. Cardiac catheterization February 2016 showed 80% LAD and 60-70% RCA. Ejection fraction 55-65%. The patient had PCI of the LAD and RCA; ultrasound March 2016 showed no DVT.Echocardiogram September 2020 showed normal LV function, grade 1 diastolic dysfunction.  Venous Dopplers September 2020 showed no DVT. Since she was last seen,   Current Outpatient Medications  Medication Sig Dispense Refill  . amLODipine (NORVASC) 5 MG tablet Take 1 tablet (5 mg total) by mouth daily. 90 tablet 3  . aspirin 81 MG tablet Take 1 tablet (81 mg total) by mouth daily. 30 tablet   . atorvastatin (LIPITOR) 80 MG tablet TAKE 1 TABLET (80 MG TOTAL) BY MOUTH DAILY AT 6 PM. 90 tablet 2  . clonazepam (KLONOPIN) 0.125 MG disintegrating tablet Take 0.125 mg by mouth as directed.    . clotrimazole (MYCELEX) 10 MG troche Take 1 tablet (10 mg total) by mouth 3 (three) times daily. 15 Troche 0  . DULoxetine (CYMBALTA) 60 MG capsule TAKE 1 CAPSULE (60 MG TOTAL) BY MOUTH 2 (TWO) TIMES DAILY. 180 capsule 1  . ENABLEX 7.5 MG 24 hr tablet Take 1 tablet by mouth daily.    Marland Kitchen esomeprazole (NEXIUM) 40 MG capsule TAKE 1 CAPSULE (40 MG TOTAL) BY MOUTH DAILY AT 12 NOON. 90 capsule 1  . furosemide (LASIX) 20 MG tablet Take 1 tablet (20 mg total) by mouth daily. 90 tablet 3  . gabapentin (NEURONTIN) 100 MG capsule Take 1 capsule (100 mg total) by mouth 3 (three) times daily. (Patient taking differently: Take 100 mg by mouth daily as needed. ) 270 capsule 0  . hydrochlorothiazide (MICROZIDE) 12.5 MG  capsule Take 12.5 mg by mouth daily.    Marland Kitchen losartan (COZAAR) 100 MG tablet TAKE 1 TABLET BY MOUTH EVERY DAY 90 tablet 1  . methocarbamol (ROBAXIN) 500 MG tablet Take 500 mg by mouth every 8 (eight) hours as needed for muscle spasms.    . methylphenidate (RITALIN) 20 MG tablet Take 1 tablet (20 mg total) by mouth daily. At noon 30 tablet 0  . metoprolol succinate (TOPROL-XL) 50 MG 24 hr tablet TAKE 1 TABLET BY MOUTH EVERY DAY 90 tablet 0  . modafinil (PROVIGIL) 200 MG tablet Take 1 tablet (200 mg total) by mouth daily. 90 tablet 3  . NASONEX 50 MCG/ACT nasal spray Place 50 sprays into the nose daily.    Marland Kitchen ocrelizumab 600 mg in sodium chloride 0.9 % 500 mL Inject 600 mg into the vein every 6 (six) months.    . potassium chloride (KLOR-CON) 10 MEQ tablet Take 1 tablet (10 mEq total) by mouth daily. 90 tablet 2   No current facility-administered medications for this visit.     Past Medical History:  Diagnosis Date  . Anxiety   . Arthritis   . CAD (coronary artery disease)    a. 06/2014 abnl MV;  b. 06/2014 Cath: LM nl, LAD 4m (2.75x28 Promus DES), LCX nl, RCA 60-44m (FFR = 0.80 -> 3.0x24 Promus DES), EF 55-65%.  . Carpal tunnel syndrome   . Cervical dysplasia   . Chronic fatigue   .  Depression   . GERD (gastroesophageal reflux disease)   . Hypertension   . Mitral valve prolapse   . Multiple sclerosis (HCC)   . Pneumonia   . Prolonged QT interval   . RLS (restless legs syndrome) 04/28/2015    Past Surgical History:  Procedure Laterality Date  . ABDOMINAL HYSTERECTOMY    . BICEPS TENDON REPAIR    . bladder resuspension    . CARPAL TUNNEL RELEASE    . CERVICAL SPINE SURGERY  10-16-2009  . CORONARY ANGIOPLASTY WITH STENT PLACEMENT    . DILATION AND CURETTAGE OF UTERUS    . FOOT SURGERY Bilateral    For bunions  . HAMMER TOE SURGERY Left   . LEFT HEART CATHETERIZATION WITH CORONARY ANGIOGRAM N/A 06/29/2014   Procedure: LEFT HEART CATHETERIZATION WITH CORONARY ANGIOGRAM;  Surgeon:  Micheline Chapman, MD;  Location: [facility] CATH LAB;  Service: Cardiovascular;  Laterality: N/A;  . ORIF TOE FRACTURE Right   . Prolong QTC     Heart  . rectovaginal fistula repair    . ROTATOR CUFF REPAIR Left     Social History   Socioeconomic History  . Marital status: Divorced    Spouse name: Not on file  . Number of children: 2  . Years of education: 55  . Highest education level: Not on file  Occupational History  . Occupation: Disabled    Comment: Disability  Tobacco Use  . Smoking status: Current Every Day Smoker    Packs/day: 0.50    Years: 35.00    Pack years: 17.50    Types: Cigarettes    Last attempt to quit: 06/30/2014    Years since quitting: 5.3  . Smokeless tobacco: Never Used  Vaping Use  . Vaping Use: Never used  Substance and Sexual Activity  . Alcohol use: No    Alcohol/week: 0.0 standard drinks  . Drug use: No  . Sexual activity: Not on file  Other Topics Concern  . Not on file  Social History Narrative   Patient does not drink caffeine.   Patient is right handed.   Social Determinants of Health   Financial Resource Strain:   . Difficulty of Paying Living Expenses:   Food Insecurity:   . Worried About Programme researcher, broadcasting/film/video in the Last Year:   . Barista in the Last Year:   Transportation Needs:   . Freight forwarder (Medical):   Marland Kitchen Lack of Transportation (Non-Medical):   Physical Activity:   . Days of Exercise per Week:   . Minutes of Exercise per Session:   Stress:   . Feeling of Stress :   Social Connections:   . Frequency of Communication with Friends and Family:   . Frequency of Social Gatherings with Friends and Family:   . Attends Religious Services:   . Active Member of Clubs or Organizations:   . Attends Banker Meetings:   Marland Kitchen Marital Status:   Intimate Partner Violence:   . Fear of Current or Ex-Partner:   . Emotionally Abused:   Marland Kitchen Physically Abused:   . Sexually Abused:     Family History  Problem  Relation Age of Onset  . Melanoma Mother   . Colon polyps Mother   . Diabetes Mother   . Breast cancer Maternal Grandmother   . Colon cancer Maternal Grandmother   . Brain cancer Maternal Grandfather   . Lung cancer Maternal Grandfather   . Diabetes Maternal Grandfather   . Kidney  disease Neg Hx   . Esophageal cancer Neg Hx   . Gallbladder disease Neg Hx     ROS: no fevers or chills, productive cough, hemoptysis, dysphasia, odynophagia, melena, hematochezia, dysuria, hematuria, rash, seizure activity, orthopnea, PND, pedal edema, claudication. Remaining systems are negative.  Physical Exam: Well-developed well-nourished in no acute distress.  Skin is warm and dry.  HEENT is normal.  Neck is supple.  Chest is clear to auscultation with normal expansion.  Cardiovascular exam is regular rate and rhythm.  Abdominal exam nontender or distended. No masses palpated. Extremities show no edema. neuro grossly intact  ECG- personally reviewed  A/P  1 CAD-no CP; continue ASA and statin.  2 Hypertension-BP controlled; continue present meds and follow.  3 hyperlipidemia-continue statin.  4 tobacco abuse-pt again counseled on discontinuing.  5 chronic diastolic CHF-euvolemic on exam; continue lasix; check BMET.  6 H/o prolonged QT-no h/o syncope.  Olga Millers, MD

## 2019-10-29 ENCOUNTER — Ambulatory Visit: Payer: Medicare Other | Admitting: Cardiology

## 2019-11-07 ENCOUNTER — Other Ambulatory Visit: Payer: Self-pay | Admitting: Neurology

## 2019-11-07 DIAGNOSIS — G35 Multiple sclerosis: Secondary | ICD-10-CM

## 2019-11-07 MED ORDER — METHYLPHENIDATE HCL 20 MG PO TABS: 20.0000 mg | ORAL_TABLET | Freq: Every day | ORAL | 0 refills | Status: DC

## 2019-11-07 NOTE — Progress Notes (Signed)
I received a message from answering service that this patient had called requesting a refill for her Ritalin.  I reviewed the patient's chart and authorize refill for Ritalin 20 mg daily 30 tablets no refills.  I called patient and spoke to her and let her know.  She voiced understanding

## 2019-11-20 ENCOUNTER — Other Ambulatory Visit: Payer: Self-pay | Admitting: Cardiology

## 2019-12-10 ENCOUNTER — Telehealth: Payer: Self-pay | Admitting: Neurology

## 2019-12-10 NOTE — Telephone Encounter (Signed)
I faxed this phone note along with pt's most recent med list. Pt most recently saw a [facility] physician on 8/2 so I printed the care everywhere medication list. All of this has been faxed back to [facility] with [facility]. Received a receipt of confirmation.

## 2019-12-10 NOTE — Telephone Encounter (Signed)
This patient has been diagnosed with secondary progressive multiple sclerosis.  She is being converted to Ocre[employer], she will remain on the medication on an indefinite basis as long as she has good tolerance and a good response to the medication.  No need for I/O catheterization due to neurogenic bladder associated with multiple sclerosis will be an ongoing need, lifetime duration.

## 2019-12-12 ENCOUNTER — Telehealth: Payer: Self-pay | Admitting: Neurology

## 2019-12-12 ENCOUNTER — Other Ambulatory Visit: Payer: Self-pay | Admitting: Cardiology

## 2019-12-12 DIAGNOSIS — G35 Multiple sclerosis: Secondary | ICD-10-CM

## 2019-12-12 MED ORDER — METHYLPHENIDATE HCL 20 MG PO TABS: 20.0000 mg | ORAL_TABLET | Freq: Every day | ORAL | 0 refills | Status: DC

## 2019-12-12 NOTE — Telephone Encounter (Signed)
I will send in a refill for the Ritalin.

## 2019-12-12 NOTE — Telephone Encounter (Signed)
Pt is needing a refill on her methylphenidate (RITALIN) 20 MG tablet [facility] [address]

## 2019-12-25 ENCOUNTER — Other Ambulatory Visit: Payer: Self-pay | Admitting: Cardiology

## 2019-12-25 DIAGNOSIS — R601 Generalized edema: Secondary | ICD-10-CM

## 2020-01-13 ENCOUNTER — Other Ambulatory Visit: Payer: Self-pay | Admitting: Cardiology

## 2020-01-14 ENCOUNTER — Other Ambulatory Visit: Payer: Self-pay | Admitting: Neurology

## 2020-01-14 DIAGNOSIS — G35 Multiple sclerosis: Secondary | ICD-10-CM

## 2020-01-14 MED ORDER — METHYLPHENIDATE HCL 20 MG PO TABS: 20.0000 mg | ORAL_TABLET | Freq: Every day | ORAL | 0 refills | Status: DC

## 2020-01-14 NOTE — Telephone Encounter (Signed)
Pt called needing a refill on her methylphenidate (RITALIN) 20 MG tablet [facility] on [address]

## 2020-02-09 ENCOUNTER — Other Ambulatory Visit: Payer: Self-pay | Admitting: Cardiology

## 2020-02-16 ENCOUNTER — Other Ambulatory Visit: Payer: Self-pay | Admitting: Neurology

## 2020-02-16 DIAGNOSIS — G35 Multiple sclerosis: Secondary | ICD-10-CM

## 2020-02-16 MED ORDER — METHYLPHENIDATE HCL 20 MG PO TABS: 20.0000 mg | ORAL_TABLET | Freq: Every day | ORAL | 0 refills | Status: DC

## 2020-02-27 MED ORDER — ESOMEPRAZOLE MAGNESIUM 40 MG PO CPDR: 40.0000 mg | DELAYED_RELEASE_CAPSULE | Freq: Every day | ORAL | 0 refills | Status: DC

## 2020-03-04 ENCOUNTER — Ambulatory Visit: Payer: Medicare PPO | Admitting: Neurology

## 2020-03-08 NOTE — Progress Notes (Signed)
HPI: CAD, hypertension, prolonged QT and mitral valve prolapse. Holter monitor in September of 2012 showed sinus rhythm with rare PVC. Patient had an abdominal CT for abdominal pain in December 2015. 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 Nuclear study January 2016 showed an ejection fraction of 56%, probable mild breast attenuation with possible mild mid anteroseptal ischemia. Cardiac catheterization February 2016 showed 80% LAD and 60-70% RCA. Ejection fraction 55-65%. The patient had PCI of the LAD and RCA.Echocardiogram September 2020 showed normal LV function, grade 1 diastolic dysfunction.  Venous Dopplers September 2020 showed no DVT. Since she was last seen,there is no increased dyspnea, chest pain, palpitations or syncope.  She does note continued bilateral lower extremity edema.  Current Outpatient Medications  Medication Sig Dispense Refill   amLODipine (NORVASC) 5 MG tablet TAKE 1 TABLET BY MOUTH EVERY DAY 30 tablet 1   aspirin 81 MG tablet Take 1 tablet (81 mg total) by mouth daily. 30 tablet    atorvastatin (LIPITOR) 80 MG tablet TAKE 1 TABLET (80 MG TOTAL) BY MOUTH DAILY AT 6 PM. 90 tablet 2   clonazepam (KLONOPIN) 0.125 MG disintegrating tablet Take 0.125 mg by mouth as directed.     clotrimazole (MYCELEX) 10 MG troche Take 1 tablet (10 mg total) by mouth 3 (three) times daily. 15 Troche 0   DULoxetine (CYMBALTA) 60 MG capsule TAKE 1 CAPSULE (60 MG TOTAL) BY MOUTH 2 (TWO) TIMES DAILY. 180 capsule 1   ENABLEX 7.5 MG 24 hr tablet Take 1 tablet by mouth daily.     esomeprazole (NEXIUM) 40 MG capsule Take 1 capsule (40 mg total) by mouth daily at 12 noon. 90 capsule 0   furosemide (LASIX) 20 MG tablet TAKE 1 TABLET BY MOUTH EVERY DAY 90 tablet 1   gabapentin (NEURONTIN) 100 MG capsule Take 1 capsule (100 mg total) by mouth 3 (three) times daily. (Patient taking differently: Take 100 mg by mouth daily as needed. )  270 capsule 0   hydrochlorothiazide (MICROZIDE) 12.5 MG capsule Take 12.5 mg by mouth daily.     losartan (COZAAR) 100 MG tablet TAKE 1 TABLET BY MOUTH EVERY DAY 30 tablet 1   methocarbamol (ROBAXIN) 500 MG tablet Take 500 mg by mouth every 8 (eight) hours as needed for muscle spasms.     methylphenidate (RITALIN) 20 MG tablet Take 1 tablet (20 mg total) by mouth daily. At noon 30 tablet 0   metoprolol succinate (TOPROL-XL) 50 MG 24 hr tablet TAKE 1 TABLET BY MOUTH EVERY DAY 90 tablet 3   modafinil (PROVIGIL) 200 MG tablet Take 1 tablet (200 mg total) by mouth daily. 90 tablet 3   NASONEX 50 MCG/ACT nasal spray Place 50 sprays into the nose daily.     ocrelizumab 600 mg in sodium chloride 0.9 % 500 mL Inject 600 mg into the vein every 6 (six) months.     potassium chloride (KLOR-CON) 10 MEQ tablet Take 1 tablet (10 mEq total) by mouth daily. 90 tablet 2   No current facility-administered medications for this visit.     Past Medical History:  Diagnosis Date   Anxiety    Arthritis    CAD (coronary artery disease)    a. 06/2014 abnl MV;  b. 06/2014 Cath: LM nl, LAD 20m (2.75x28 Promus DES), LCX nl, RCA 60-52m (FFR = 0.80 -> 3.0x24 Promus DES), EF 55-65%.   Carpal tunnel syndrome    Cervical dysplasia  Chronic fatigue    Depression    GERD (gastroesophageal reflux disease)    Hypertension    Mitral valve prolapse    Multiple sclerosis (HCC)    Pneumonia    Prolonged QT interval    RLS (restless legs syndrome) 04/28/2015    Past Surgical History:  Procedure Laterality Date   ABDOMINAL HYSTERECTOMY     BICEPS TENDON REPAIR     bladder resuspension     CARPAL TUNNEL RELEASE     CERVICAL SPINE SURGERY  10-16-2009   CORONARY ANGIOPLASTY WITH STENT PLACEMENT     DILATION AND CURETTAGE OF UTERUS     FOOT SURGERY Bilateral    For bunions   HAMMER TOE SURGERY Left    LEFT HEART CATHETERIZATION WITH CORONARY ANGIOGRAM N/A 06/29/2014   Procedure: LEFT  HEART CATHETERIZATION WITH CORONARY ANGIOGRAM;  Surgeon: Micheline Chapman, MD;  Location: [facility] CATH LAB;  Service: Cardiovascular;  Laterality: N/A;   ORIF TOE FRACTURE Right    Prolong QTC     Heart   rectovaginal fistula repair     ROTATOR CUFF REPAIR Left     Social History   Socioeconomic History   Marital status: Divorced    Spouse name: Not on file   Number of children: 2   Years of education: 34   Highest education level: Not on file  Occupational History   Occupation: Disabled    Comment: Disability  Tobacco Use   Smoking status: Current Every Day Smoker    Packs/day: 0.50    Years: 35.00    Pack years: 17.50    Types: Cigarettes    Last attempt to quit: 06/30/2014    Years since quitting: 5.7   Smokeless tobacco: Never Used  Vaping Use   Vaping Use: Never used  Substance and Sexual Activity   Alcohol use: No    Alcohol/week: 0.0 standard drinks   Drug use: No   Sexual activity: Not on file  Other Topics Concern   Not on file  Social History Narrative   Patient does not drink caffeine.   Patient is right handed.   Social Determinants of Health   Financial Resource Strain:    Difficulty of Paying Living Expenses: Not on file  Food Insecurity:    Worried About Programme researcher, broadcasting/film/video in the Last Year: Not on file   [employer] of Food in the Last Year: Not on file  Transportation Needs:    Lack of Transportation (Medical): Not on file   Lack of Transportation (Non-Medical): Not on file  Physical Activity:    Days of Exercise per Week: Not on file   Minutes of Exercise per Session: Not on file  Stress:    Feeling of Stress : Not on file  Social Connections:    Frequency of Communication with Friends and Family: Not on file   Frequency of Social Gatherings with Friends and Family: Not on file   Attends Religious Services: Not on file   Active Member of Clubs or Organizations: Not on file   Attends Banker Meetings: Not on  file   Marital Status: Not on file  Intimate Partner Violence:    Fear of Current or Ex-Partner: Not on file   Emotionally Abused: Not on file   Physically Abused: Not on file   Sexually Abused: Not on file    Family History  Problem Relation Age of Onset   Melanoma Mother    Colon polyps Mother  Diabetes Mother    Breast cancer Maternal Grandmother    Colon cancer Maternal Grandmother    Brain cancer Maternal Grandfather    Lung cancer Maternal Grandfather    Diabetes Maternal Grandfather    Kidney disease Neg Hx    Esophageal cancer Neg Hx    Gallbladder disease Neg Hx     ROS: no fevers or chills, productive cough, hemoptysis, dysphasia, odynophagia, melena, hematochezia, dysuria, hematuria, rash, seizure activity, orthopnea, PND, claudication. Remaining systems are negative.  Physical Exam: Well-developed well-nourished in no acute distress.  Skin is warm and dry.  HEENT is normal.  Neck is supple.  Chest is clear to auscultation with normal expansion.  Cardiovascular exam is regular rate and rhythm.  Abdominal exam nontender or distended. No masses palpated. Extremities show no edema. neuro grossly intact  ECG-normal sinus rhythm at a rate of 76, prolonged QT interval.  Personally reviewed  A/P  1 coronary artery disease-plan to continue medical therapy with aspirin and statin.  2 hypertension-pressure controlled. Continue present medications and follow.  3 hyperlipidemia-continue statin.  Check lipids and liver.  4 chronic diastolic congestive heart failure/lower extremity edema-she does not appear to be volume overloaded on examination but states she does have worsening edema at times.  Discontinue hydrochlorothiazide.  Increase Lasix to 40 mg daily.  In 1 week we will check potassium, renal function and BNP.  5 tobacco abuse-patient counseled on discontinuing.  6 prolonged QT interval-she denies any history of syncope.   Olga Millers,  MD

## 2020-03-13 ENCOUNTER — Other Ambulatory Visit: Payer: Self-pay | Admitting: Cardiology

## 2020-03-13 DIAGNOSIS — E785 Hyperlipidemia, unspecified: Secondary | ICD-10-CM

## 2020-03-15 ENCOUNTER — Other Ambulatory Visit: Payer: Self-pay

## 2020-03-15 ENCOUNTER — Encounter: Payer: Self-pay | Admitting: Cardiology

## 2020-03-15 ENCOUNTER — Ambulatory Visit (INDEPENDENT_AMBULATORY_CARE_PROVIDER_SITE_OTHER): Payer: Medicare PPO | Admitting: Cardiology

## 2020-03-15 VITALS — BP 115/60 | HR 76 | Temp 97.3°F | Ht 62.0 in | Wt 166.8 lb

## 2020-03-15 DIAGNOSIS — I2510 Atherosclerotic heart disease of native coronary artery without angina pectoris: Secondary | ICD-10-CM

## 2020-03-15 DIAGNOSIS — I10 Essential (primary) hypertension: Secondary | ICD-10-CM

## 2020-03-15 DIAGNOSIS — R601 Generalized edema: Secondary | ICD-10-CM | POA: Diagnosis not present

## 2020-03-15 DIAGNOSIS — E785 Hyperlipidemia, unspecified: Secondary | ICD-10-CM

## 2020-03-15 MED ORDER — FUROSEMIDE 20 MG PO TABS: 40.0000 mg | ORAL_TABLET | Freq: Every day | ORAL | 3 refills | Status: DC

## 2020-03-15 NOTE — Patient Instructions (Addendum)
STOP HCTZ  INCREASE FUROSEMIDE TO 40 MG ONCE DAILY= 2 OF THE 20 MG TABLETS ONCE DAILY  Lab Work:  Your physician recommends that you return for lab work FASTING-IN ONE WEEK  If you have labs (blood work) drawn today and your tests are completely normal, you will receive your results only by: Marland Kitchen MyChart Message (if you have MyChart) OR . A paper copy in the mail If you have any lab test that is abnormal or we need to change your treatment, we will call you to review the results.   Follow-Up: At [facility], you and your health needs are our priority.  As part of our continuing mission to provide you with exceptional heart care, we have created designated Provider Care Teams.  These Care Teams include your primary Cardiologist (physician) and Advanced Practice Providers (APPs -  Physician Assistants and Nurse Practitioners) who all work together to provide you with the care you need, when you need it.  We recommend signing up for the patient portal called "MyChart".  Sign up information is provided on this After Visit Summary.  MyChart is used to connect with patients for Virtual Visits (Telemedicine).  Patients are able to view lab/test results, encounter notes, upcoming appointments, etc.  Non-urgent messages can be sent to your provider as well.   To learn more about what you can do with MyChart, go to [email or web address].    Your next appointment:   6 month(s)  The format for your next appointment:   In Person  Provider:   Olga Millers, MD

## 2020-03-17 ENCOUNTER — Ambulatory Visit: Payer: Medicare PPO | Admitting: Neurology

## 2020-03-24 ENCOUNTER — Telehealth: Payer: Self-pay | Admitting: Neurology

## 2020-03-24 DIAGNOSIS — G35 Multiple sclerosis: Secondary | ICD-10-CM

## 2020-03-24 NOTE — Telephone Encounter (Signed)
Pt request refill methylphenidate (RITALIN) [facility] tablet at [id number]  

## 2020-03-25 ENCOUNTER — Encounter: Payer: Self-pay | Admitting: *Deleted

## 2020-03-25 LAB — COMPREHENSIVE METABOLIC PANEL
ALT: 15 IU/L (ref 0–32)
AST: 14 IU/L (ref 0–40)
Albumin/Globulin Ratio: 1.7 (ref 1.2–2.2)
Albumin: 4.3 g/dL (ref 3.8–4.9)
Alkaline Phosphatase: 208 IU/L — ABNORMAL HIGH (ref 44–121)
BUN/Creatinine Ratio: 9 (ref 9–23)
BUN: 7 mg/dL (ref 6–24)
Bilirubin Total: 0.3 mg/dL (ref 0.0–1.2)
CO2: 26 mmol/L (ref 20–29)
Calcium: 9.3 mg/dL (ref 8.7–10.2)
Chloride: 100 mmol/L (ref 96–106)
Creatinine, Ser: 0.78 mg/dL (ref 0.57–1.00)
GFR calc Af Amer: 98 mL/min/{1.73_m2} (ref >59)
GFR calc non Af Amer: 85 mL/min/{1.73_m2} (ref >59)
Globulin, Total: 2.6 g/dL (ref 1.5–4.5)
Glucose: 108 mg/dL — ABNORMAL HIGH (ref 65–99)
Potassium: 3.5 mmol/L (ref 3.5–5.2)
Sodium: 141 mmol/L (ref 134–144)
Total Protein: 6.9 g/dL (ref 6.0–8.5)

## 2020-03-25 LAB — LIPID PANEL
Chol/HDL Ratio: 2.4 ratio (ref 0.0–4.4)
Cholesterol, Total: 128 mg/dL (ref 100–199)
HDL: 53 mg/dL (ref >39)
LDL Chol Calc (NIH): 57 mg/dL (ref 0–99)
Triglycerides: 99 mg/dL (ref 0–149)
VLDL Cholesterol Cal: 18 mg/dL (ref 5–40)

## 2020-03-25 LAB — PRO B NATRIURETIC PEPTIDE: NT-Pro BNP: 74 pg/mL (ref 0–287)

## 2020-03-25 NOTE — Telephone Encounter (Signed)
Patient requesting refill for Ritalin 20mg  [place] Drug Registry Checked, last filled 02/16/20, 30 tabs, 0 refills, Dr. 04/17/20 Last visit 09/03/2019, cancelled appointment 03/04/20, next visit scheduled 09/02/2020

## 2020-03-26 MED ORDER — METHYLPHENIDATE HCL 20 MG PO TABS: 20.0000 mg | ORAL_TABLET | Freq: Every day | ORAL | 0 refills | Status: DC

## 2020-03-26 NOTE — Addendum Note (Signed)
Addended by: York Spaniel on: 03/26/2020 04:57 PM   Modules accepted: Orders

## 2020-03-26 NOTE — Telephone Encounter (Signed)
The prescription for Ritalin will be filled.

## 2020-04-27 ENCOUNTER — Telehealth: Payer: Self-pay | Admitting: Neurology

## 2020-04-27 NOTE — Telephone Encounter (Signed)
I called Dr. Conley Rolls.  This patient has been having increased problems with depression.  She is on Cymbalta 60 mg twice daily currently, they discussed other potential options for treatment such as Wellbutrin but she claims that she cannot tolerate this.  The patient is already followed through psychology, the psychologist is linked to a larger group with psychiatrists, possibly seeing a psychiatrist may be of some benefit.  I have no problem with changing the current regimen for depression.

## 2020-04-27 NOTE — Telephone Encounter (Signed)
Dr Conley Rolls has concerns she's like to discuss with Dr Anne Hahn re: pt.

## 2020-04-28 ENCOUNTER — Telehealth: Payer: Self-pay

## 2020-04-28 NOTE — Telephone Encounter (Signed)
Received paper pharmacy request for amlodipine 5 mg daily. I see it was taken off but it says error. Is this pt on this medication? Please advise.

## 2020-04-28 NOTE — Telephone Encounter (Signed)
Please refill if pt taking this med Olga Millers

## 2020-04-29 MED ORDER — AMLODIPINE BESYLATE 5 MG PO TABS: 5.0000 mg | ORAL_TABLET | Freq: Every day | ORAL | 3 refills | Status: DC

## 2020-04-29 NOTE — Telephone Encounter (Signed)
Called pt who confirmed she is currently taking amlodpine 5 mg daily. New refill sent to the pharmacy.

## 2020-05-04 ENCOUNTER — Telehealth: Payer: Self-pay | Admitting: *Deleted

## 2020-05-04 NOTE — Telephone Encounter (Signed)
Faxed back completed/signed PA for [employer]cre[employer] to [employer] at [phone]. Had Dr. Pearlean Brownie sign since Dr. Anne Hahn is out. Received fax confirmation.

## 2020-05-05 ENCOUNTER — Telehealth: Payer: Self-pay | Admitting: Neurology

## 2020-05-05 ENCOUNTER — Other Ambulatory Visit: Payer: Self-pay | Admitting: Emergency Medicine

## 2020-05-05 DIAGNOSIS — G35 Multiple sclerosis: Secondary | ICD-10-CM

## 2020-05-05 NOTE — Telephone Encounter (Signed)
Pt called and stated that she needs a refill on her Ritalin prescription. Best call back is [phone].

## 2020-05-05 NOTE — Telephone Encounter (Signed)
Summer Henderson from [place] called today to get a prior authorization for medication. Best call back is [phone].

## 2020-05-05 NOTE — Telephone Encounter (Signed)
Case # [id number]

## 2020-05-06 ENCOUNTER — Other Ambulatory Visit: Payer: Self-pay | Admitting: Emergency Medicine

## 2020-05-06 DIAGNOSIS — G35 Multiple sclerosis: Secondary | ICD-10-CM

## 2020-05-06 MED ORDER — METHYLPHENIDATE HCL 20 MG PO TABS: 20.0000 mg | ORAL_TABLET | Freq: Every day | ORAL | 0 refills | Status: DC

## 2020-05-06 NOTE — Telephone Encounter (Signed)
We received a denial from [facility] for the patient's Ocrevus because additional information was not received by the deadline. I have submitted an expedited appeal, faxed to [phone], and awaiting determination from [facility].

## 2020-05-06 NOTE — Telephone Encounter (Signed)
Called patient and LVM informing her that her prescription was forwarded to Dr. Frances Furbish for refill.  Informed patient that the prescription was sent in yesterday but due to a technical issue it was pushed back.

## 2020-05-06 NOTE — Telephone Encounter (Signed)
Pt called stating that her methylphenidate (RITALIN) 20 MG tablet has still not been called in. Pt would like to be called at [phone] to be informed when it has been called in.

## 2020-05-06 NOTE — Progress Notes (Signed)
Created in error

## 2020-05-06 NOTE — Telephone Encounter (Signed)
[employer] has sent a letter approving ocrevus for the patient from 05/09/2019-05/07/2021.

## 2020-05-07 ENCOUNTER — Other Ambulatory Visit: Payer: Self-pay | Admitting: Cardiology

## 2020-05-22 ENCOUNTER — Other Ambulatory Visit: Payer: Self-pay | Admitting: Cardiology

## 2020-06-08 ENCOUNTER — Telehealth: Payer: Self-pay | Admitting: Neurology

## 2020-06-08 DIAGNOSIS — G35 Multiple sclerosis: Secondary | ICD-10-CM

## 2020-06-08 MED ORDER — GABAPENTIN 100 MG PO CAPS: 100.0000 mg | ORAL_CAPSULE | Freq: Every day | ORAL | 1 refills | Status: DC

## 2020-06-08 MED ORDER — METHYLPHENIDATE HCL 20 MG PO TABS: 20.0000 mg | ORAL_TABLET | Freq: Every day | ORAL | 0 refills | Status: DC

## 2020-06-08 NOTE — Telephone Encounter (Signed)
I called the patient.  The patient over the last 2 to 3 weeks has started having restless leg syndrome symptoms involving the left arm and left leg.  She had some leftover gabapentin 100 mg capsules, this has been effective in treating her symptoms.  I will send in another prescription for her gabapentin, and for her Ritalin.

## 2020-06-08 NOTE — Telephone Encounter (Signed)
Pt called, having body jerks, started a couple of weeks ago. Some nights last long night, I can not sleep. Would like a call from the nurse.

## 2020-06-08 NOTE — Telephone Encounter (Signed)
Pt request refill methylphenidate (RITALIN[phone]

## 2020-06-08 NOTE — Telephone Encounter (Signed)
RX was sent in today by Dr. Anne Hahn.

## 2020-06-25 ENCOUNTER — Other Ambulatory Visit: Payer: Self-pay | Admitting: Cardiology

## 2020-07-08 ENCOUNTER — Ambulatory Visit (INDEPENDENT_AMBULATORY_CARE_PROVIDER_SITE_OTHER): Payer: Medicare PPO | Admitting: Neurology

## 2020-07-08 ENCOUNTER — Telehealth: Payer: Self-pay | Admitting: Neurology

## 2020-07-08 ENCOUNTER — Encounter: Payer: Self-pay | Admitting: Neurology

## 2020-07-08 VITALS — BP 132/82 | HR 86 | Ht 62.0 in | Wt 166.6 lb

## 2020-07-08 DIAGNOSIS — R269 Unspecified abnormalities of gait and mobility: Secondary | ICD-10-CM

## 2020-07-08 DIAGNOSIS — Z5181 Encounter for therapeutic drug level monitoring: Secondary | ICD-10-CM

## 2020-07-08 DIAGNOSIS — G2581 Restless legs syndrome: Secondary | ICD-10-CM

## 2020-07-08 DIAGNOSIS — G35 Multiple sclerosis: Secondary | ICD-10-CM | POA: Diagnosis not present

## 2020-07-08 DIAGNOSIS — D508 Other iron deficiency anemias: Secondary | ICD-10-CM

## 2020-07-08 MED ORDER — PRAMIPEXOLE DIHYDROCHLORIDE 0.25 MG PO TABS: 0.2500 mg | ORAL_TABLET | Freq: Every day | ORAL | 1 refills | Status: DC

## 2020-07-08 MED ORDER — ALPRAZOLAM 0.5 MG PO TABS: ORAL_TABLET | ORAL | 0 refills | Status: DC

## 2020-07-08 NOTE — Telephone Encounter (Signed)
[facility].  Francine Graven Berkley Harvey: [phone] (exp. 3/322 to 08/07/20) sent a message to [facility] [place] to reach out to the patient to schedule.

## 2020-07-08 NOTE — Telephone Encounter (Signed)
Patient called me and stated to disregard this message. She got confused on the location but know she realizes she had her last MRI at [facility] and that is where she is scheduled for 07/17/20.

## 2020-07-08 NOTE — Progress Notes (Signed)
Reason for visit: Multiple sclerosis, restless leg syndrome, gait disorder  Summer Henderson is an 60 y.o. female  History of present illness:  Ms. Summer Henderson is a 60 year old right-handed white female with a history of multiple sclerosis.  The patient is on Ocrevus, she last got her dose in January 2022.  She tolerates the drug fairly well.  She has not noted any definite new symptoms associated with the multiple sclerosis.  She does report some numbness in the hands, she has chronic weakness of the proximal muscles of the left leg.  She reports gait instability, she has not had any recent falls.  She has a neurogenic bladder that requires Botox injections, she has in and out catheterizations.  She is bothered significantly by a restless limb syndrome, her arms cause troubles at night, she has to move them vigorously to get rid of the sensation.  She is having trouble sleeping because of this.  She was recently taken off of Cymbalta and converted to Zoloft, the restless symptoms began shortly thereafter.  The patient has had ongoing problems with depression, she is followed through psychiatry.  She returns to the office today for an evaluation.  Past Medical History:  Diagnosis Date  . Anxiety   . Arthritis   . CAD (coronary artery disease)    a. 06/2014 abnl MV;  b. 06/2014 Cath: LM nl, LAD 72m (2.75x28 Promus DES), LCX nl, RCA 60-50m (FFR = 0.80 -> 3.0x24 Promus DES), EF 55-65%.  . Carpal tunnel syndrome   . Cervical dysplasia   . Chronic fatigue   . Depression   . GERD (gastroesophageal reflux disease)   . Hypertension   . Mitral valve prolapse   . Multiple sclerosis (HCC)   . Pneumonia   . Prolonged QT interval   . RLS (restless legs syndrome) 04/28/2015    Past Surgical History:  Procedure Laterality Date  . ABDOMINAL HYSTERECTOMY    . BICEPS TENDON REPAIR    . bladder resuspension    . CARPAL TUNNEL RELEASE    . CERVICAL SPINE SURGERY  10-16-2009  . CORONARY ANGIOPLASTY WITH STENT  PLACEMENT    . DILATION AND CURETTAGE OF UTERUS    . FOOT SURGERY Bilateral    For bunions  . HAMMER TOE SURGERY Left   . LEFT HEART CATHETERIZATION WITH CORONARY ANGIOGRAM N/A 06/29/2014   Procedure: LEFT HEART CATHETERIZATION WITH CORONARY ANGIOGRAM;  Surgeon: Micheline Chapman, MD;  Location: [facility] CATH LAB;  Service: Cardiovascular;  Laterality: N/A;  . ORIF TOE FRACTURE Right   . Prolong QTC     Heart  . rectovaginal fistula repair    . ROTATOR CUFF REPAIR Left     Family History  Problem Relation Age of Onset  . Melanoma Mother   . Colon polyps Mother   . Diabetes Mother   . Breast cancer Maternal Grandmother   . Colon cancer Maternal Grandmother   . Brain cancer Maternal Grandfather   . Lung cancer Maternal Grandfather   . Diabetes Maternal Grandfather   . Kidney disease Neg Hx   . Esophageal cancer Neg Hx   . Gallbladder disease Neg Hx     Social history:  reports that she has been smoking cigarettes. She has a 35.00 pack-year smoking history. She has never used smokeless tobacco. She reports that she does not drink alcohol and does not use drugs.    Allergies  Allergen Reactions  . Demerol [Meperidine] Other (See Comments)    [derm]  rash, swelling, itching Other reaction(s): Other (See Comments) [derm] rash, swelling, itching  . Erythromycin   . Morphine And Related   . Sulfa Antibiotics   . Diflucan [Fluconazole] Other (See Comments)    States she has prolonged QT and cannot take this.  . Other     Other reaction(s): Other (See Comments) Z-pak causes EKG changes, was told by cardiologist not to take this.  . Sulfacetamide Sodium   . Erythromycin Base Rash    [Derm] rash  . Fesoterodine Palpitations  . Tape Other (See Comments)    Adhesive tape And Steri Strips; Local reaction-burns skin  . Toviaz [Fesoterodine Fumarate Er] Palpitations    Medications:  Prior to Admission medications   Medication Sig Start Date End Date Taking? Authorizing Provider   amLODipine (NORVASC) 5 MG tablet Take 1 tablet (5 mg total) by mouth daily. 04/29/20 07/28/20 Yes Lewayne Bunting, MD  aspirin 81 MG tablet Take 1 tablet (81 mg total) by mouth daily. 07/24/14  Yes Lewayne Bunting, MD  atorvastatin (LIPITOR) 80 MG tablet TAKE 1 TABLET (80 MG TOTAL) BY MOUTH DAILY AT 6 PM. 03/16/20  Yes Crenshaw, Madolyn Frieze, MD  clotrimazole (MYCELEX) 10 MG troche Take 1 tablet (10 mg total) by mouth 3 (three) times daily. 04/28/19  Yes York Spaniel, MD  ENABLEX 7.5 MG 24 hr tablet Take 1 tablet by mouth daily. 06/18/14  Yes [provider]  esomeprazole (NEXIUM) 40 MG capsule TAKE 1 CAPSULE (40 MG TOTAL) BY MOUTH DAILY AT 12 NOON. 05/24/20  Yes Lewayne Bunting, MD  furosemide (LASIX) 20 MG tablet Take 2 tablets (40 mg total) by mouth daily. 03/15/20  Yes Lewayne Bunting, MD  gabapentin (NEURONTIN) 100 MG capsule Take 1 capsule (100 mg total) by mouth at bedtime. 06/08/20  Yes York Spaniel, MD  losartan (COZAAR) 100 MG tablet TAKE 1 TABLET BY MOUTH EVERY DAY 06/25/20  Yes Crenshaw, Madolyn Frieze, MD  methocarbamol (ROBAXIN) 500 MG tablet Take 500 mg by mouth every 8 (eight) hours as needed for muscle spasms.   Yes [provider]  methylphenidate (RITALIN) 20 MG tablet Take 1 tablet (20 mg total) by mouth daily. At noon 06/08/20  Yes York Spaniel, MD  metoprolol succinate (TOPROL-XL) 50 MG 24 hr tablet TAKE 1 TABLET BY MOUTH EVERY DAY 01/14/20  Yes Lewayne Bunting, MD  modafinil (PROVIGIL) 200 MG tablet Take 1 tablet (200 mg total) by mouth daily. 01/27/19  Yes York Spaniel, MD  NASONEX 50 MCG/ACT nasal spray Place 50 sprays into the nose daily. 09/16/12  Yes [provider]  ocrelizumab 600 mg in sodium chloride 0.9 % 500 mL Inject 600 mg into the vein every 6 (six) months.   Yes [provider]  potassium chloride (KLOR-CON) 10 MEQ tablet Take 1 tablet (10 mEq total) by mouth daily. 10/10/19  Yes Lewayne Bunting, MD  sertraline (ZOLOFT) 50  MG tablet Take 50 mg by mouth daily.   Yes [provider]  clonazepam (KLONOPIN) 0.125 MG disintegrating tablet Take 0.125 mg by mouth as directed.    [provider]  DULoxetine (CYMBALTA) 60 MG capsule TAKE 1 CAPSULE (60 MG TOTAL) BY MOUTH 2 (TWO) TIMES DAILY. 08/20/19   York Spaniel, MD    ROS:  Out of a complete 14 system review of symptoms, the patient complains only of the following symptoms, and all other reviewed systems are negative.  Depression Restless legs Walking difficulty  Blood pressure 132/82, pulse 86, height 5\' 2"  (1.575 m), weight 166 lb 9.6 oz (75.6 kg).  Physical Exam  General: The patient is alert and cooperative at the time of the examination.  Skin: No significant peripheral edema is noted.   Neurologic Exam  Mental status: The patient is alert and oriented x 3 at the time of the examination. The patient has apparent normal recent and remote memory, with an apparently normal attention span and concentration ability.   Cranial nerves: Facial symmetry is present. Speech is normal, no aphasia or dysarthria is noted. Extraocular movements are full. Visual fields are full.  Pupils are equal, round, and reactive to light.  Discs are flat bilaterally.  Motor: The patient has good strength in all 4 extremities, with exception of 4/5 strength with the proximal left leg with hip flexion.  Sensory examination: Soft touch sensation is symmetric on the face, arms, and legs.  Coordination: The patient has good finger-nose-finger and heel-to-shin bilaterally.  Gait and station: The patient has a wide-based, staggery gait.  Tandem gait is very unsteady.  Romberg is positive.  Reflexes: Deep tendon reflexes are symmetric.   Assessment/Plan:  1.  Multiple sclerosis  2.  Neurogenic bladder  3.  Gait disorder  4.  Restless leg syndrome  5.  Depression  The patient will undergo blood work today.  She will be placed on Mirapex taking 0.25  mg at night.  She will follow up here in 6 months.  She will continue Ocrevus therapy.  MRI of the brain will be done.  Last study was done 2019.  2020 MD 07/08/2020 9:27 AM  [facility] [address] [place], [place]  Phone [phone] Fax [phone]

## 2020-07-08 NOTE — Telephone Encounter (Signed)
Lawson Fiscal reach out to the patient to schedule her MRI she is scheduled for 07/17/20. She also informed Lawson Fiscal that she is claustrophobic and would need something to help her.

## 2020-07-09 ENCOUNTER — Other Ambulatory Visit: Payer: Self-pay | Admitting: Neurology

## 2020-07-09 LAB — IRON AND TIBC
Iron Saturation: 8 % — CL (ref 15–55)
Iron: 35 ug/dL (ref 27–159)
Total Iron Binding Capacity: 416 ug/dL (ref 250–450)
UIBC: 381 ug/dL (ref 131–425)

## 2020-07-09 LAB — CBC WITH DIFFERENTIAL/PLATELET
Basophils Absolute: 0.1 10*3/uL (ref 0.0–0.2)
Basos: 1 %
EOS (ABSOLUTE): 0.1 10*3/uL (ref 0.0–0.4)
Eos: 1 %
Hematocrit: 40.3 % (ref 34.0–46.6)
Hemoglobin: 13.4 g/dL (ref 11.1–15.9)
Immature Grans (Abs): 0 10*3/uL (ref 0.0–0.1)
Immature Granulocytes: 0 %
Lymphocytes Absolute: 1.9 10*3/uL (ref 0.7–3.1)
Lymphs: 17 %
MCH: 28.8 pg (ref 26.6–33.0)
MCHC: 33.3 g/dL (ref 31.5–35.7)
MCV: 87 fL (ref 79–97)
Monocytes Absolute: 0.6 10*3/uL (ref 0.1–0.9)
Monocytes: 5 %
Neutrophils Absolute: 8.4 10*3/uL — ABNORMAL HIGH (ref 1.4–7.0)
Neutrophils: 76 %
Platelets: 465 10*3/uL — ABNORMAL HIGH (ref 150–450)
RBC: 4.65 x10E6/uL (ref 3.77–5.28)
RDW: 14.4 % (ref 11.7–15.4)
WBC: 11 10*3/uL — ABNORMAL HIGH (ref 3.4–10.8)

## 2020-07-09 LAB — COMPREHENSIVE METABOLIC PANEL
ALT: 22 IU/L (ref 0–32)
AST: 23 IU/L (ref 0–40)
Albumin/Globulin Ratio: 1.7 (ref 1.2–2.2)
Albumin: 4.4 g/dL (ref 3.8–4.9)
Alkaline Phosphatase: 212 IU/L — ABNORMAL HIGH (ref 44–121)
BUN/Creatinine Ratio: 8 — ABNORMAL LOW (ref 9–23)
BUN: 6 mg/dL (ref 6–24)
Bilirubin Total: 0.3 mg/dL (ref 0.0–1.2)
CO2: 23 mmol/L (ref 20–29)
Calcium: 9.6 mg/dL (ref 8.7–10.2)
Chloride: 105 mmol/L (ref 96–106)
Creatinine, Ser: 0.79 mg/dL (ref 0.57–1.00)
Globulin, Total: 2.6 g/dL (ref 1.5–4.5)
Glucose: 77 mg/dL (ref 65–99)
Potassium: 4.1 mmol/L (ref 3.5–5.2)
Sodium: 143 mmol/L (ref 134–144)
Total Protein: 7 g/dL (ref 6.0–8.5)
eGFR: 87 mL/min/{1.73_m2} (ref >59)

## 2020-07-09 LAB — IGG, IGA, IGM
IgA/Immunoglobulin A, Serum: 108 mg/dL (ref 87–352)
IgG (Immunoglobin G), Serum: 1017 mg/dL (ref 586–1602)
IgM (Immunoglobulin M), Srm: 29 mg/dL (ref 26–217)

## 2020-07-09 LAB — FERRITIN: Ferritin: 13 ng/mL — ABNORMAL LOW (ref 15–150)

## 2020-07-09 MED ORDER — FERROUS GLUCONATE 324 (38 FE) MG PO TABS: 324.0000 mg | ORAL_TABLET | Freq: Every day | ORAL | 1 refills | Status: DC

## 2020-07-09 NOTE — Progress Notes (Signed)
The patient was found to have a low ferritin level, borderline low normal iron level, will add ferrous gluconate in hopes of improving restless leg syndrome.

## 2020-07-17 ENCOUNTER — Ambulatory Visit (HOSPITAL_BASED_OUTPATIENT_CLINIC_OR_DEPARTMENT_OTHER): Payer: Medicare PPO

## 2020-07-20 ENCOUNTER — Other Ambulatory Visit: Payer: Self-pay | Admitting: Neurology

## 2020-07-20 DIAGNOSIS — G35 Multiple sclerosis: Secondary | ICD-10-CM

## 2020-07-20 MED ORDER — METHYLPHENIDATE HCL 20 MG PO TABS
20.0000 mg | ORAL_TABLET | Freq: Every day | ORAL | 0 refills | Status: DC
Start: 2020-07-20 — End: 2020-08-23

## 2020-07-20 NOTE — Telephone Encounter (Signed)
Pt is needing a refill on her methylphenidate (RITALIN) 20 MG tablet sent in to the [facility] on [address]

## 2020-07-24 ENCOUNTER — Other Ambulatory Visit: Payer: Self-pay

## 2020-07-24 ENCOUNTER — Ambulatory Visit (HOSPITAL_BASED_OUTPATIENT_CLINIC_OR_DEPARTMENT_OTHER)
Admission: RE | Admit: 2020-07-24 | Discharge: 2020-07-24 | Disposition: A | Discharge status: Home / Self Care | Payer: Medicare PPO | Source: Ambulatory Visit | Attending: Neurology | Admitting: Neurology

## 2020-07-24 DIAGNOSIS — G35 Multiple sclerosis: Secondary | ICD-10-CM | POA: Insufficient documentation

## 2020-07-24 MED ORDER — GADOBUTROL 1 MMOL/ML IV SOLN
7.5000 mL | Freq: Once | INTRAVENOUS | Status: AC | PRN
  Administered 2020-07-24: 7.5 mL via INTRAVENOUS

## 2020-07-27 ENCOUNTER — Telehealth: Payer: Self-pay | Admitting: Neurology

## 2020-07-27 NOTE — Telephone Encounter (Signed)
I called the patient.  MRI of the brain shows good stability, fairly low plaque burden seen.   MRI brain 07/26/20:  IMPRESSION: Stable burden chronic demyelinating disease. No evidence of active demyelination.

## 2020-07-28 ENCOUNTER — Other Ambulatory Visit: Payer: Self-pay | Admitting: Cardiology

## 2020-07-31 ENCOUNTER — Other Ambulatory Visit: Payer: Self-pay | Admitting: Neurology

## 2020-08-22 ENCOUNTER — Other Ambulatory Visit: Payer: Self-pay | Admitting: Cardiology

## 2020-08-23 ENCOUNTER — Other Ambulatory Visit: Payer: Self-pay | Admitting: Neurology

## 2020-08-23 DIAGNOSIS — G35 Multiple sclerosis: Secondary | ICD-10-CM

## 2020-08-23 MED ORDER — METHYLPHENIDATE HCL 20 MG PO TABS: 20.0000 mg | ORAL_TABLET | Freq: Every day | ORAL | 0 refills | Status: DC

## 2020-08-23 NOTE — Addendum Note (Signed)
Addended by: Arther Abbott on: 08/23/2020 02:43 PM   Modules accepted: Orders

## 2020-08-23 NOTE — Telephone Encounter (Signed)
Patient called in needing her Ritalin refilled.

## 2020-08-23 NOTE — Telephone Encounter (Signed)
Rx has been sent to the pharmacy electronically. ° °

## 2020-08-26 ENCOUNTER — Other Ambulatory Visit: Payer: Self-pay | Admitting: Neurology

## 2020-08-31 ENCOUNTER — Other Ambulatory Visit: Payer: Self-pay | Admitting: Cardiology

## 2020-08-31 DIAGNOSIS — R601 Generalized edema: Secondary | ICD-10-CM

## 2020-09-02 ENCOUNTER — Ambulatory Visit: Payer: Medicare PPO | Admitting: Neurology

## 2020-09-23 ENCOUNTER — Other Ambulatory Visit: Payer: Self-pay | Admitting: Neurology

## 2020-09-23 DIAGNOSIS — G35 Multiple sclerosis: Secondary | ICD-10-CM

## 2020-09-23 MED ORDER — METHYLPHENIDATE HCL 20 MG PO TABS: 20.0000 mg | ORAL_TABLET | Freq: Every day | ORAL | 0 refills | Status: DC

## 2020-09-23 NOTE — Addendum Note (Signed)
Addended by: [place] Pink on: 09/23/2020 02:34 PM   Modules accepted: Orders

## 2020-09-23 NOTE — Telephone Encounter (Signed)
Pt has called for the refill on her methylphenidate (RITALIN) 20 MG tablet @ [facility] [phone]

## 2020-10-22 ENCOUNTER — Other Ambulatory Visit: Payer: Self-pay | Admitting: Neurology

## 2020-10-22 DIAGNOSIS — G35 Multiple sclerosis: Secondary | ICD-10-CM

## 2020-10-22 NOTE — Telephone Encounter (Signed)
Pt is needing a refill on her methylphenidate (RITALIN) 20 MG tablet sent in to the [facility] on [address]

## 2020-10-25 MED ORDER — METHYLPHENIDATE HCL 20 MG PO TABS: 20.0000 mg | ORAL_TABLET | Freq: Every day | ORAL | 0 refills | Status: DC

## 2020-10-25 NOTE — Addendum Note (Signed)
Addended by: Alexia Freestone R on: 10/25/2020 11:21 AM   Modules accepted: Orders

## 2020-10-26 NOTE — Telephone Encounter (Signed)
Received notice from [employer] that PA was needed from [place].  Called Ocre[facility]us to give PA information.

## 2020-10-26 NOTE — Telephone Encounter (Signed)
[employer] Approval number - [id number]

## 2020-11-24 ENCOUNTER — Other Ambulatory Visit: Payer: Self-pay | Admitting: Cardiology

## 2020-11-24 DIAGNOSIS — E785 Hyperlipidemia, unspecified: Secondary | ICD-10-CM

## 2020-11-24 DIAGNOSIS — R601 Generalized edema: Secondary | ICD-10-CM

## 2020-11-25 ENCOUNTER — Telehealth: Payer: Self-pay | Admitting: Neurology

## 2020-11-25 DIAGNOSIS — G35 Multiple sclerosis: Secondary | ICD-10-CM

## 2020-11-25 MED ORDER — METHYLPHENIDATE HCL 20 MG PO TABS: 20.0000 mg | ORAL_TABLET | Freq: Every day | ORAL | 0 refills | Status: DC

## 2020-11-25 NOTE — Telephone Encounter (Signed)
Pt is requesting a refill for methylphenidate (RITALIN) 20 MG tablet.  Pharmacy[phone]

## 2020-11-25 NOTE — Telephone Encounter (Signed)
A prescription for the Ritalin will be called in.

## 2020-11-25 NOTE — Addendum Note (Signed)
Addended by: York Spaniel on: 11/25/2020 01:32 PM   Modules accepted: Orders

## 2020-11-30 ENCOUNTER — Other Ambulatory Visit: Payer: Self-pay | Admitting: Neurology

## 2020-12-19 ENCOUNTER — Other Ambulatory Visit: Payer: Self-pay | Admitting: Neurology

## 2020-12-27 ENCOUNTER — Telehealth: Payer: Self-pay | Admitting: Neurology

## 2020-12-27 DIAGNOSIS — G35 Multiple sclerosis: Secondary | ICD-10-CM

## 2020-12-27 MED ORDER — METHYLPHENIDATE HCL 20 MG PO TABS: 20.0000 mg | ORAL_TABLET | Freq: Every day | ORAL | 0 refills | Status: DC

## 2020-12-27 NOTE — Telephone Encounter (Signed)
Pt request refill methylphenidate (RITALIN) [facility] tablet at [id number]  

## 2020-12-27 NOTE — Telephone Encounter (Signed)
The Ritalin prescription will be refilled.

## 2021-01-01 ENCOUNTER — Other Ambulatory Visit: Payer: Self-pay | Admitting: Cardiology

## 2021-01-01 ENCOUNTER — Other Ambulatory Visit: Payer: Self-pay | Admitting: Neurology

## 2021-01-01 DIAGNOSIS — R601 Generalized edema: Secondary | ICD-10-CM

## 2021-01-11 ENCOUNTER — Telehealth: Payer: Self-pay

## 2021-01-11 NOTE — Telephone Encounter (Signed)
I called [facility].  Patient last had an Ocrevus infusion on December 09, 2020.  She is due in February 2023.  However, [facility] pharmacy has called the patient and let her know that they are no longer able to provide service to the patient.  It is unclear why.  They will call me back with more information.

## 2021-01-12 NOTE — Telephone Encounter (Signed)
Dorisann Frames from [facility] has called Baxter Hire, RN back to inform they will not be able to provide service to this pt.  Marchelle Folks can be reached on her cell# at [phone]

## 2021-01-18 NOTE — Telephone Encounter (Signed)
Received this notice from [facility] at [facility]: "She may be one of those we have to wait until 05/09/21 for accurate idea of her coverage. Will advise once known. And let me know if you hear anything further from the RN. "

## 2021-01-18 NOTE — Telephone Encounter (Signed)
I called patient.  She reports that [facility] informed her they do not have the staff any longer to service her area in [facility].  She would like to continue getting her Ocrevus infusions at home.  She is unable to leave the house because she is caring for a disabled child.  I advised her that I would reach out to some specialty infusion companies to see if they would be able to take her on as an Ocrevus patient.  Patient verbalized understanding and appreciation.  I reached out to [facility], Jeri Modena, to see if this patient would be eligible for their at home infusion services.

## 2021-01-18 NOTE — Telephone Encounter (Signed)
I called Marchelle Folks, RN at [facility] back.  No answer, left a voicemail asking her to call me back.  If Marchelle Folks, RN returns this call and I am unavailable, please ask her why they are unable to provide the Ocrevus infusions any longer to this patient.  Is it because of an insurance change?  Is it because [facility] does not accept her insurance any longer?  Or there are other issues going on?

## 2021-01-24 NOTE — Telephone Encounter (Signed)
Received this notice from [facility] at [facility]: "Ellwood Handler- As expected the claim for the Ocrevus is rejecting due to refill too soon.  Since her next dose is not due until 2/23, we will have to resubmit for auth in January when the insurance rolls over for 05/08/21.  We will automatically verify and obtain auth.  I will keep you posted as we move through process on 05/09/21.  Call me if you want to further discuss or this is unclear. Thanks so much.   Pam~   (this is just a copy of the portion of the test claim)  Claim Status                                      : Rejected  Patient First Name (CA)                           : Summer  Patient Last Name (CB)                            : Henderson  Date Of Birth (C4)                                : [id number]  Reject Code (FB)                                  : REFILL TOO SOON (79)  Reject Code (FB)                                  : PLAN LIMITATIONS  Reject Code (FB)                                  : PRIOR AUTHORIZATION REQUIRED (75)

## 2021-01-27 ENCOUNTER — Telehealth: Payer: Self-pay | Admitting: Neurology

## 2021-01-27 DIAGNOSIS — G35 Multiple sclerosis: Secondary | ICD-10-CM

## 2021-01-27 MED ORDER — METHYLPHENIDATE HCL 20 MG PO TABS: 20.0000 mg | ORAL_TABLET | Freq: Every day | ORAL | 0 refills | Status: DC

## 2021-01-27 NOTE — Telephone Encounter (Signed)
[place] drug registry has been verified. Last refill was 11/25/2020, # 30 for a 30 day supply.  Pt complaint with f/u appt.

## 2021-01-27 NOTE — Telephone Encounter (Signed)
Pt request refill methylphenidate (RITALIN) [facility] tablet at [id number]  

## 2021-01-27 NOTE — Telephone Encounter (Signed)
I will refill the Ritalin.

## 2021-02-03 ENCOUNTER — Encounter: Payer: Self-pay | Admitting: Neurology

## 2021-02-03 ENCOUNTER — Other Ambulatory Visit: Payer: Self-pay | Admitting: Neurology

## 2021-02-03 ENCOUNTER — Ambulatory Visit (INDEPENDENT_AMBULATORY_CARE_PROVIDER_SITE_OTHER): Payer: Medicare PPO | Admitting: Neurology

## 2021-02-03 VITALS — BP 126/82 | HR 93 | Ht 62.0 in | Wt 161.0 lb

## 2021-02-03 DIAGNOSIS — R269 Unspecified abnormalities of gait and mobility: Secondary | ICD-10-CM | POA: Diagnosis not present

## 2021-02-03 DIAGNOSIS — G2581 Restless legs syndrome: Secondary | ICD-10-CM | POA: Diagnosis not present

## 2021-02-03 DIAGNOSIS — G35 Multiple sclerosis: Secondary | ICD-10-CM | POA: Diagnosis not present

## 2021-02-03 DIAGNOSIS — R310 Gross hematuria: Secondary | ICD-10-CM

## 2021-02-03 DIAGNOSIS — Z5181 Encounter for therapeutic drug level monitoring: Secondary | ICD-10-CM | POA: Diagnosis not present

## 2021-02-03 MED ORDER — NITROFURANTOIN MONOHYD MACRO 100 MG PO CAPS: 100.0000 mg | ORAL_CAPSULE | Freq: Two times a day (BID) | ORAL | 0 refills | Status: DC

## 2021-02-03 MED ORDER — DOXYCYCLINE HYCLATE 100 MG PO TBEC: 100.0000 mg | DELAYED_RELEASE_TABLET | Freq: Two times a day (BID) | ORAL | 0 refills | Status: DC

## 2021-02-03 NOTE — Progress Notes (Signed)
Reason for visit: Multiple sclerosis  Summer Henderson is an 60 y.o. female  History of present illness:  Ms. Sazama is a 60 year old right-handed white female with a history of secondary progressive multiple sclerosis.  The patient is on Ocrevus and tolerates the medication well.  She has a chronic gait disorder, she has not had any falls since last seen.  She reports some issues with restless leg syndrome but her Mirapex is quite effective to treat this.  She began having blood in her urine this morning, she does have a history of frequent urinary tract infections.  She has no history of renal calculi.  She had a sinus infection in mid August and was treated with antibiotics for this.  She has had some decreased hearing in her right ear with fluid behind the ear and some worsening of her gait.  The patient normally uses a cane or a walker for ambulation.  She comes in today for further evaluation.  Past Medical History:  Diagnosis Date   Anxiety    Arthritis    CAD (coronary artery disease)    a. 06/2014 abnl MV;  b. 06/2014 Cath: LM nl, LAD 78m (2.75x28 Promus DES), LCX nl, RCA 60-8m (FFR = 0.80 -> 3.0x24 Promus DES), EF 55-65%.   Carpal tunnel syndrome    Cervical dysplasia    Chronic fatigue    Depression    GERD (gastroesophageal reflux disease)    Hypertension    Mitral valve prolapse    Multiple sclerosis (HCC)    Pneumonia    Prolonged QT interval    RLS (restless legs syndrome) 04/28/2015    Past Surgical History:  Procedure Laterality Date   ABDOMINAL HYSTERECTOMY     BICEPS TENDON REPAIR     bladder resuspension     CARPAL TUNNEL RELEASE     CERVICAL SPINE SURGERY  10-16-2009   CORONARY ANGIOPLASTY WITH STENT PLACEMENT     DILATION AND CURETTAGE OF UTERUS     FOOT SURGERY Bilateral    For bunions   HAMMER TOE SURGERY Left    LEFT HEART CATHETERIZATION WITH CORONARY ANGIOGRAM N/A 06/29/2014   Procedure: LEFT HEART CATHETERIZATION WITH CORONARY ANGIOGRAM;  Surgeon:  Micheline Chapman, MD;  Location: [facility] CATH LAB;  Service: Cardiovascular;  Laterality: N/A;   ORIF TOE FRACTURE Right    Prolong QTC     Heart   rectovaginal fistula repair     ROTATOR CUFF REPAIR Left     Family History  Problem Relation Age of Onset   Melanoma Mother    Colon polyps Mother    Diabetes Mother    Breast cancer Maternal Grandmother    Colon cancer Maternal Grandmother    Brain cancer Maternal Grandfather    Lung cancer Maternal Grandfather    Diabetes Maternal Grandfather    Kidney disease Neg Hx    Esophageal cancer Neg Hx    Gallbladder disease Neg Hx     Social history:  reports that she has been smoking cigarettes. She has a 35.00 pack-year smoking history. She has never used smokeless tobacco. She reports that she does not drink alcohol and does not use drugs.    Allergies  Allergen Reactions   Demerol [Meperidine] Other (See Comments)    [derm] rash, swelling, itching Other reaction(s): Other (See Comments) [derm] rash, swelling, itching   Erythromycin    Morphine And Related    Sulfa Antibiotics    Diflucan [Fluconazole] Other (See Comments)  States she has prolonged QT and cannot take this.   Other     Other reaction(s): Other (See Comments) Z-pak causes EKG changes, was told by cardiologist not to take this.   Sulfacetamide Sodium    Erythromycin Base Rash    [Derm] rash   Fesoterodine Palpitations   Tape Other (See Comments)    Adhesive tape And Steri Strips; Local reaction-burns skin   Toviaz [Fesoterodine Fumarate Er] Palpitations    Medications:  Prior to Admission medications   Medication Sig Start Date End Date Taking? Authorizing Provider  amLODipine (NORVASC) 5 MG tablet Take 1 tablet (5 mg total) by mouth daily. 04/29/20 02/03/21 Yes Lewayne Bunting, MD  aspirin 81 MG tablet Take 1 tablet (81 mg total) by mouth daily. 07/24/14  Yes Lewayne Bunting, MD  atorvastatin (LIPITOR) 80 MG tablet TAKE 1 TABLET (80 MG TOTAL) BY MOUTH  DAILY AT 6 PM. 11/24/20  Yes Crenshaw, Madolyn Frieze, MD  clotrimazole (MYCELEX) 10 MG troche Take 1 tablet (10 mg total) by mouth 3 (three) times daily. 04/28/19  Yes York Spaniel, MD  ENABLEX 7.5 MG 24 hr tablet Take 1 tablet by mouth daily. 06/18/14  Yes [provider]  esomeprazole (NEXIUM) 40 MG capsule TAKE 1 CAPSULE (40 MG TOTAL) BY MOUTH DAILY AT 12 NOON. 08/23/20  Yes Lewayne Bunting, MD  ferrous gluconate (FERGON) 324 MG tablet TAKE 1 TABLET BY MOUTH DAILY WITH BREAKFAST 01/03/21  Yes York Spaniel, MD  furosemide (LASIX) 20 MG tablet TAKE 2 TABLETS (40 MG TOTAL) BY MOUTH DAILY. 01/03/21  Yes Lewayne Bunting, MD  gabapentin (NEURONTIN) 100 MG capsule TAKE 1 CAPSULE BY MOUTH AT BEDTIME. 11/30/20  Yes York Spaniel, MD  losartan (COZAAR) 100 MG tablet TAKE 1 TABLET BY MOUTH EVERY DAY 07/28/20  Yes Lewayne Bunting, MD  methocarbamol (ROBAXIN) 500 MG tablet Take 500 mg by mouth every 8 (eight) hours as needed for muscle spasms.   Yes [provider]  methylphenidate (RITALIN) 20 MG tablet Take 1 tablet (20 mg total) by mouth daily. At noon 01/27/21  Yes York Spaniel, MD  metoprolol succinate (TOPROL-XL) 50 MG 24 hr tablet TAKE 1 TABLET BY MOUTH EVERY DAY 01/03/21  Yes Lewayne Bunting, MD  modafinil (PROVIGIL) 200 MG tablet Take 1 tablet (200 mg total) by mouth daily. 01/27/19  Yes York Spaniel, MD  NASONEX 50 MCG/ACT nasal spray Place 50 sprays into the nose daily. 09/16/12  Yes [provider]  ocrelizumab 600 mg in sodium chloride 0.9 % 500 mL Inject 600 mg into the vein every 6 (six) months.   Yes [provider]  potassium chloride (KLOR-CON) 10 MEQ tablet TAKE 1 TABLET BY MOUTH EVERY DAY 11/24/20  Yes Lewayne Bunting, MD  pramipexole (MIRAPEX) 0.25 MG tablet TAKE 1 TABLET BY MOUTH EVERYDAY AT BEDTIME 12/20/20  Yes York Spaniel, MD  sertraline (ZOLOFT) 50 MG tablet Take 50 mg by mouth daily.   Yes [provider]     ROS:  Out of a complete 14 system review of symptoms, the patient complains only of the following symptoms, and all other reviewed systems are negative.  Walking difficulty Blood in the urine Decreased hearing right ear  Blood pressure 126/82, pulse 93, height 5\' 2"  (1.575 m), weight 161 lb (73 kg).  Physical Exam  General: The patient is alert and cooperative at the time of the examination.  The patient is moderately obese.  Skin: No significant peripheral edema is noted.   Neurologic Exam  Mental status: The patient is alert and oriented x 3 at the time of the examination. The patient has apparent normal recent and remote memory, with an apparently normal attention span and concentration ability.   Cranial nerves: Facial symmetry is present. Speech is normal, no aphasia or dysarthria is noted. Extraocular movements are full. Visual fields are full.  Pupils are equal, round, and reactive to light.  Discs are flat bilaterally.  Motor: The patient has good strength in all 4 extremities, with exception of 4/5 strength with hip flexion on the left.  Sensory examination: Soft touch sensation is symmetric on the face, arms, and legs.  Coordination: The patient has good finger-nose-finger and heel-to-shin bilaterally.  Gait and station: The patient has a slightly wide-based, unsteady gait.  Tandem gait was not attempted.  Romberg is negative but is unsteady.  Reflexes: Deep tendon reflexes are symmetric.   Assessment/Plan:  1.  Multiple sclerosis  2.  Gait disorder  3.  Recent hematuria  The patient will be set up for blood work today, she will continue her Ocrevus therapy.  We will check a urinalysis.  I will send in a prescription for doxycycline for the patient, we will stop the medication if no bladder infection is noted.  She will follow-up in 6 months, in the future she can be followed through Dr. Lucia Gaskins.  Marlan Palau MD 02/03/2021 11:50 AM  [facility] [address] [place], [place] [address]  Phone [phone] Fax [phone]

## 2021-02-04 LAB — CBC WITH DIFFERENTIAL/PLATELET
Basophils Absolute: 0 10*3/uL (ref 0.0–0.2)
Basos: 0 %
EOS (ABSOLUTE): 0.1 10*3/uL (ref 0.0–0.4)
Eos: 1 %
Hematocrit: 42.4 % (ref 34.0–46.6)
Hemoglobin: 15 g/dL (ref 11.1–15.9)
Immature Grans (Abs): 0 10*3/uL (ref 0.0–0.1)
Immature Granulocytes: 0 %
Lymphocytes Absolute: 2.1 10*3/uL (ref 0.7–3.1)
Lymphs: 13 %
MCH: 32.5 pg (ref 26.6–33.0)
MCHC: 35.4 g/dL (ref 31.5–35.7)
MCV: 92 fL (ref 79–97)
Monocytes Absolute: 0.9 10*3/uL (ref 0.1–0.9)
Monocytes: 6 %
Neutrophils Absolute: 12.5 10*3/uL — ABNORMAL HIGH (ref 1.4–7.0)
Neutrophils: 80 %
Platelets: 489 10*3/uL — ABNORMAL HIGH (ref 150–450)
RBC: 4.62 x10E6/uL (ref 3.77–5.28)
RDW: 13.2 % (ref 11.7–15.4)
WBC: 15.7 10*3/uL — ABNORMAL HIGH (ref 3.4–10.8)

## 2021-02-04 LAB — IGG, IGA, IGM
IgA/Immunoglobulin A, Serum: 105 mg/dL (ref 87–352)
IgG (Immunoglobin G), Serum: 917 mg/dL (ref 586–1602)
IgM (Immunoglobulin M), Srm: 26 mg/dL (ref 26–217)

## 2021-02-04 LAB — URINALYSIS, ROUTINE W REFLEX MICROSCOPIC
Bilirubin, UA: NEGATIVE
Glucose, UA: NEGATIVE
Ketones, UA: NEGATIVE
Nitrite, UA: NEGATIVE
Protein,UA: NEGATIVE
Specific Gravity, UA: <=1.005 — AB (ref 1.005–1.030)
Urobilinogen, Ur: 0.2 mg/dL (ref 0.2–1.0)
pH, UA: 6.5 (ref 5.0–7.5)

## 2021-02-04 LAB — COMPREHENSIVE METABOLIC PANEL
ALT: 27 IU/L (ref 0–32)
AST: 20 IU/L (ref 0–40)
Albumin/Globulin Ratio: 1.9 (ref 1.2–2.2)
Albumin: 4.6 g/dL (ref 3.8–4.9)
Alkaline Phosphatase: 150 IU/L — ABNORMAL HIGH (ref 44–121)
BUN/Creatinine Ratio: 7 — ABNORMAL LOW (ref 9–23)
BUN: 5 mg/dL — ABNORMAL LOW (ref 6–24)
Bilirubin Total: 0.3 mg/dL (ref 0.0–1.2)
CO2: 21 mmol/L (ref 20–29)
Calcium: 9.6 mg/dL (ref 8.7–10.2)
Chloride: 106 mmol/L (ref 96–106)
Creatinine, Ser: 0.76 mg/dL (ref 0.57–1.00)
Globulin, Total: 2.4 g/dL (ref 1.5–4.5)
Glucose: 81 mg/dL (ref 70–99)
Potassium: 4 mmol/L (ref 3.5–5.2)
Sodium: 144 mmol/L (ref 134–144)
Total Protein: 7 g/dL (ref 6.0–8.5)
eGFR: 91 mL/min/{1.73_m2} (ref >59)

## 2021-02-04 LAB — MICROSCOPIC EXAMINATION
Bacteria, UA: NONE SEEN
Casts: NONE SEEN /lpf
Epithelial Cells (non renal): NONE SEEN /hpf (ref 0–10)
RBC, Urine: NONE SEEN /hpf (ref 0–2)
WBC, UA: >30 /hpf — AB (ref 0–5)

## 2021-02-15 ENCOUNTER — Other Ambulatory Visit: Payer: Self-pay | Admitting: Cardiology

## 2021-03-01 NOTE — Progress Notes (Signed)
HPI: FU CAD, hypertension, prolonged QT and mitral valve prolapse. Holter monitor in September of 2012 showed sinus rhythm with rare PVC. Patient had an abdominal CT for abdominal pain in December 2015. 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 Nuclear study January 2016 showed an ejection fraction of 56%, probable mild breast attenuation with possible mild mid anteroseptal ischemia. Cardiac catheterization February 2016 showed 80% LAD and 60-70% RCA. Ejection fraction 55-65%. The patient had PCI of the LAD and RCA. Echocardiogram September 2020 showed normal LV function, grade 1 diastolic dysfunction.  Venous Dopplers September 2020 showed no DVT. Since she was last seen, patient denies dyspnea, chest pain, palpitations or syncope.  Current Outpatient Medications  Medication Sig Dispense Refill   amLODipine (NORVASC) 5 MG tablet Take 1 tablet (5 mg total) by mouth daily. 90 tablet 3   aspirin 81 MG tablet Take 1 tablet (81 mg total) by mouth daily. 30 tablet    atorvastatin (LIPITOR) 80 MG tablet TAKE 1 TABLET (80 MG TOTAL) BY MOUTH DAILY AT 6 PM. 90 tablet 2   clotrimazole (MYCELEX) 10 MG troche Take 1 tablet (10 mg total) by mouth 3 (three) times daily. 15 Troche 0   ENABLEX 7.5 MG 24 hr tablet Take 1 tablet by mouth daily.     esomeprazole (NEXIUM) 40 MG capsule TAKE 1 CAPSULE (40 MG TOTAL) BY MOUTH DAILY AT 12 NOON. 90 capsule 1   ferrous gluconate (FERGON) 324 MG tablet TAKE 1 TABLET BY MOUTH DAILY WITH BREAKFAST 90 tablet 1   furosemide (LASIX) 20 MG tablet TAKE 2 TABLETS (40 MG TOTAL) BY MOUTH DAILY. 180 tablet 0   gabapentin (NEURONTIN) 100 MG capsule TAKE 1 CAPSULE BY MOUTH AT BEDTIME. 90 capsule 1   losartan (COZAAR) 100 MG tablet TAKE 1 TABLET BY MOUTH EVERY DAY 90 tablet 3   methocarbamol (ROBAXIN) 500 MG tablet Take 500 mg by mouth every 8 (eight) hours as needed for muscle spasms.     methylphenidate (RITALIN) 20 MG tablet  Take 1 tablet (20 mg total) by mouth daily. At noon 30 tablet 0   metoprolol succinate (TOPROL-XL) 50 MG 24 hr tablet TAKE 1 TABLET BY MOUTH EVERY DAY 90 tablet 0   modafinil (PROVIGIL) 200 MG tablet Take 1 tablet (200 mg total) by mouth daily. 90 tablet 3   NASONEX 50 MCG/ACT nasal spray Place 50 sprays into the nose daily.     nitrofurantoin, macrocrystal-monohydrate, (MACROBID) 100 MG capsule Take 1 capsule (100 mg total) by mouth 2 (two) times daily. 10 capsule 0   ocrelizumab 600 mg in sodium chloride 0.9 % 500 mL Inject 600 mg into the vein every 6 (six) months.     potassium chloride (KLOR-CON) 10 MEQ tablet TAKE 1 TABLET BY MOUTH EVERY DAY 90 tablet 2   pramipexole (MIRAPEX) 0.25 MG tablet TAKE 1 TABLET BY MOUTH EVERYDAY AT BEDTIME 90 tablet 3   sertraline (ZOLOFT) 50 MG tablet Take 50 mg by mouth daily.     No current facility-administered medications for this visit.     Past Medical History:  Diagnosis Date   Anxiety    Arthritis    CAD (coronary artery disease)    a. 06/2014 abnl MV;  b. 06/2014 Cath: LM nl, LAD 58m (2.75x28 Promus DES), LCX nl, RCA 60-63m (FFR = 0.80 -> 3.0x24 Promus DES), EF 55-65%.   Carpal tunnel syndrome    Cervical dysplasia    Chronic  fatigue    Depression    GERD (gastroesophageal reflux disease)    Hypertension    Mitral valve prolapse    Multiple sclerosis (HCC)    Pneumonia    Prolonged QT interval    RLS (restless legs syndrome) 04/28/2015    Past Surgical History:  Procedure Laterality Date   ABDOMINAL HYSTERECTOMY     BICEPS TENDON REPAIR     bladder resuspension     CARPAL TUNNEL RELEASE     CERVICAL SPINE SURGERY  10-16-2009   CORONARY ANGIOPLASTY WITH STENT PLACEMENT     DILATION AND CURETTAGE OF UTERUS     FOOT SURGERY Bilateral    For bunions   HAMMER TOE SURGERY Left    LEFT HEART CATHETERIZATION WITH CORONARY ANGIOGRAM N/A 06/29/2014   Procedure: LEFT HEART CATHETERIZATION WITH CORONARY ANGIOGRAM;  Surgeon: Micheline Chapman,  MD;  Location: [facility] CATH LAB;  Service: Cardiovascular;  Laterality: N/A;   ORIF TOE FRACTURE Right    Prolong QTC     Heart   rectovaginal fistula repair     ROTATOR CUFF REPAIR Left     Social History   Socioeconomic History   Marital status: Divorced    Spouse name: Not on file   Number of children: 2   Years of education: 67   Highest education level: Not on file  Occupational History   Occupation: Disabled    Comment: Disability  Tobacco Use   Smoking status: Every Day    Packs/day: 1.00    Years: 35.00    Pack years: 35.00    Types: Cigarettes    Last attempt to quit: 06/30/2014    Years since quitting: 6.6   Smokeless tobacco: Never  Vaping Use   Vaping Use: Never used  Substance and Sexual Activity   Alcohol use: No    Alcohol/week: 0.0 standard drinks   Drug use: No   Sexual activity: Not on file  Other Topics Concern   Not on file  Social History Narrative   Patient lives with son   Patient does not drink caffeine.   Patient is right handed.   Social Determinants of Health   Financial Resource Strain: Not on file  Food Insecurity: Not on file  Transportation Needs: Not on file  Physical Activity: Not on file  Stress: Not on file  Social Connections: Not on file  Intimate Partner Violence: Not on file    Family History  Problem Relation Age of Onset   Melanoma Mother    Colon polyps Mother    Diabetes Mother    Breast cancer Maternal Grandmother    Colon cancer Maternal Grandmother    Brain cancer Maternal Grandfather    Lung cancer Maternal Grandfather    Diabetes Maternal Grandfather    Kidney disease Neg Hx    Esophageal cancer Neg Hx    Gallbladder disease Neg Hx     ROS: no fevers or chills, productive cough, hemoptysis, dysphasia, odynophagia, melena, hematochezia, dysuria, hematuria, rash, seizure activity, orthopnea, PND, pedal edema, claudication. Remaining systems are negative.  Physical Exam: Well-developed well-nourished in no  acute distress.  Skin is warm and dry.  HEENT is normal.  Neck is supple.  Chest is clear to auscultation with normal expansion.  Cardiovascular exam is regular rate and rhythm.  Abdominal exam nontender or distended. No masses palpated. Extremities show no edema. neuro grossly intact  ECG-normal sinus rhythm at a rate of 72, no ST changes.  Personally reviewed  A/P  1 coronary artery disease-patient denies chest pain.  Continue aspirin and statin.  2 hypertension-blood pressure controlled.  Continue present medical regimen.  3 hyperlipidemia-continue statin. Check lipids.  4 chronic diastolic congestive heart failure-she appears to be euvolemic today.  We will continue diuretic at present dose.   5 tobacco abuse-patient again counseled on discontinuing.  6 prolonged QT interval-no history of syncope.  We will continue to follow.  Olga Millers, MD

## 2021-03-07 ENCOUNTER — Other Ambulatory Visit: Payer: Self-pay | Admitting: Neurology

## 2021-03-07 DIAGNOSIS — G35 Multiple sclerosis: Secondary | ICD-10-CM

## 2021-03-07 MED ORDER — METHYLPHENIDATE HCL 20 MG PO TABS: 20.0000 mg | ORAL_TABLET | Freq: Every day | ORAL | 0 refills | Status: DC

## 2021-03-07 NOTE — Telephone Encounter (Signed)
Pt has called for a refill on her methylphenidate (RITALIN) 20 MG tablet [facility] [phone]

## 2021-03-09 ENCOUNTER — Ambulatory Visit (INDEPENDENT_AMBULATORY_CARE_PROVIDER_SITE_OTHER): Payer: Medicare PPO | Admitting: Cardiology

## 2021-03-09 ENCOUNTER — Encounter: Payer: Self-pay | Admitting: Cardiology

## 2021-03-09 ENCOUNTER — Other Ambulatory Visit: Payer: Self-pay

## 2021-03-09 VITALS — BP 144/84 | HR 72 | Ht 62.0 in | Wt 162.8 lb

## 2021-03-09 DIAGNOSIS — E785 Hyperlipidemia, unspecified: Secondary | ICD-10-CM

## 2021-03-09 DIAGNOSIS — I2510 Atherosclerotic heart disease of native coronary artery without angina pectoris: Secondary | ICD-10-CM

## 2021-03-09 DIAGNOSIS — I10 Essential (primary) hypertension: Secondary | ICD-10-CM

## 2021-03-09 NOTE — Patient Instructions (Signed)

## 2021-03-30 ENCOUNTER — Other Ambulatory Visit: Payer: Self-pay | Admitting: Cardiology

## 2021-03-30 DIAGNOSIS — R601 Generalized edema: Secondary | ICD-10-CM

## 2021-04-07 ENCOUNTER — Other Ambulatory Visit: Payer: Self-pay | Admitting: Neurology

## 2021-04-07 DIAGNOSIS — G35 Multiple sclerosis: Secondary | ICD-10-CM

## 2021-04-07 MED ORDER — METHYLPHENIDATE HCL 20 MG PO TABS: 20.0000 mg | ORAL_TABLET | Freq: Every day | ORAL | 0 refills | Status: DC

## 2021-04-07 NOTE — Telephone Encounter (Signed)
Pt requesting refill for methylphenidate (RITALIN) 20 MG tablet. [phone] [place] [place] - [address], [address] AT [facility] [address]

## 2021-04-20 ENCOUNTER — Other Ambulatory Visit: Payer: Self-pay | Admitting: Cardiology

## 2021-05-05 ENCOUNTER — Telehealth: Payer: Self-pay | Admitting: Neurology

## 2021-05-05 NOTE — Telephone Encounter (Signed)
Spoke with Maralyn Sago NP who spoke with the pt. We will setup the pt to see Dr Marjory Lies going forward for her MS care. Pt has been scheduled for 08/01/21 at 2:00 PM arrival 1:30 PM with Dr Marjory Lies.

## 2021-05-05 NOTE — Telephone Encounter (Signed)
Patient would like a call back from nurse regarding who will be doing her home infusion which is coming up in February. She has not heard back from anyone regarding this. [phone]

## 2021-05-05 NOTE — Telephone Encounter (Signed)
I called pt, who was actually in the office with her son, seeing Sarah NP.  I relayed that there is September 19,2022 note relating to [facility]fusion about needing to get PA in January 2023 for upcoming infusion in February 2023.  I relayed they are aware, but will forward message to [place], [place] as well.  She only mentioned it because she was here with her son.  I said no problem.

## 2021-05-10 NOTE — Telephone Encounter (Signed)
Spoke with [facility] and was advised demographics, insurance card, and office visit notes were needed for order to be expedited. I have compiled and sent order in.

## 2021-05-10 NOTE — Telephone Encounter (Signed)
Spoke with Pam at [facility] They need an order. Dr. Leta Baptist signed the ocrevus order and it has been faxed to [phone] as requested. Received a receipt of confirmation.

## 2021-05-10 NOTE — Telephone Encounter (Signed)
I have reached out to [facility] at [facility] for more information on the status of her Ocrevus infusion.

## 2021-05-11 ENCOUNTER — Other Ambulatory Visit: Payer: Self-pay | Admitting: Neurology

## 2021-05-11 DIAGNOSIS — G35 Multiple sclerosis: Secondary | ICD-10-CM

## 2021-05-11 MED ORDER — METHYLPHENIDATE HCL 20 MG PO TABS: 20.0000 mg | ORAL_TABLET | Freq: Every day | ORAL | 0 refills | Status: DC

## 2021-05-11 NOTE — Telephone Encounter (Signed)
Pt is needing a refill request for her methylphenidate (RITALIN) 20 MG tablet [facility]

## 2021-05-16 NOTE — Telephone Encounter (Signed)
Received this notice from [facility] at [facility]: "Thank you for sending the orders so quickly this a.m. We did receive the orders for Tashana's Ocrevus.  The team will start processing.  Dawnyel has [employer] which as of 05/08/21 requires approval for home infusion for our team to support her service  at home. We will send packet to them for approval and then begin review/qualifying to send to insurance for auth for the drug.  We will keep you posted.   Rozann Lesches, is our new Dentist rep and I will work with him and our team to transition Rosealynn to [facility]im[facility] Needle to oversee and work through the [employer] process.  Barth Kirks cell is: [phone]  Casimiro Needle does a great job with [facility].  He does not have EPIC access yet, so feel free to reach out to me via in basket or cell as well: [phone]. We will keep you posted on progress with Ronya's Ocrevus."

## 2021-05-23 ENCOUNTER — Telehealth: Payer: Self-pay

## 2021-05-23 NOTE — Telephone Encounter (Signed)
PA for Ocrevus was attempted through [facility], received this message back: Authorization already on file for this request. Authorization starting on 05/08/2021 and ending on 05/07/2022.  I have updated the home infusion department on this message.

## 2021-05-25 ENCOUNTER — Encounter: Payer: Self-pay | Admitting: *Deleted

## 2021-06-06 NOTE — Telephone Encounter (Signed)
Per Rozann Lesches, [facility] she is schedule to receive Ocrevus on 06/09/21.

## 2021-06-08 NOTE — Telephone Encounter (Signed)
Kathlene November with [facility] called and updated the pt is requesting to put the infusion on hold tomorrow until she talks with pharmacist. Kathlene November was unsure what questions she had but feels like once she speaks with pharmacist this issue should be resolved.  Pt is scheduled for ocrevus tomorrow afternoon.

## 2021-06-09 ENCOUNTER — Telehealth: Payer: Self-pay | Admitting: *Deleted

## 2021-06-09 NOTE — Telephone Encounter (Signed)
Received fax from [facility] re: Ocrevus orders to be signed by MD. Placed on Dr [employer] desk for review, signature.

## 2021-06-09 NOTE — Telephone Encounter (Signed)
Ocrevus orders signed by Dr Marjory Lies, faxed to [facility]. Received confirmation.

## 2021-06-10 ENCOUNTER — Telehealth: Payer: Self-pay | Admitting: Diagnostic Neuroimaging

## 2021-06-10 NOTE — Telephone Encounter (Signed)
Patient called me to update [place] on her Ocrevus orders.  Previously Dr. Anne Hahn had Ocrevus infused over 3 hours with 1 hour monitoring period afterward.  Also patient was receiving Solu-Medrol 100 mg premedication instead of 125 mg.  Patient does not want to take Pepcid that she takes daily Nexium.  I gave the infusion nurse verbal orders for these modifications.  Please contact patient to confirm these orders are in place for future Ocrevus infusions.  Suanne Marker, MD 06/10/2021, 10:39 AM Certified in Neurology, Neurophysiology and Neuroimaging  [facility] [address], [address] [place], [place] [address] [phone]

## 2021-06-13 ENCOUNTER — Other Ambulatory Visit: Payer: Self-pay | Admitting: Neurology

## 2021-06-13 DIAGNOSIS — G35 Multiple sclerosis: Secondary | ICD-10-CM

## 2021-06-13 MED ORDER — METHYLPHENIDATE HCL 20 MG PO TABS: 20.0000 mg | ORAL_TABLET | Freq: Every day | ORAL | 0 refills | Status: DC

## 2021-06-13 NOTE — Telephone Encounter (Signed)
Pt request refill for methylphenidate (RITALIN) [phone]

## 2021-06-13 NOTE — Telephone Encounter (Signed)
Called patient who stated she did receive ocrevus infusion on 06/10/21. She is unsure how much solu medrol the nurse gave her, and she only took benadryl 25 mg. I informed her per Dr Richrd Humbles note that the RN was given a verbal order to administer solu medrol 100 mg. I informed her that her Ocrevus orders do not include pepcid. I advised her I'll call pharmacy and confirm new orders. She verbalized understanding, appreciation. Called [facility], spoke with Abby and advised I need to confirm patient will receive correct pre meds with all upcoming infusions. She transferred me to Amy, pharmacist and I confirmed that her new pre med order is solu medrol 100 mg, no pepcid.

## 2021-06-13 NOTE — Telephone Encounter (Signed)
Received new Ocrevus orders with corrected solu medrol pre med dose. Placed on MD desk for review, signature.

## 2021-06-14 NOTE — Telephone Encounter (Signed)
Clarification of premedications re: Ocrevus orders signed, faxed to [employer], received confirmation. Therapy duration: started 06/10/2021 for 1 year.

## 2021-06-28 NOTE — Telephone Encounter (Signed)
Per Delorse Limber ,[facility] her next infusion is 12/08/21.

## 2021-07-01 ENCOUNTER — Other Ambulatory Visit: Payer: Self-pay | Admitting: Cardiology

## 2021-07-01 DIAGNOSIS — R601 Generalized edema: Secondary | ICD-10-CM

## 2021-07-11 ENCOUNTER — Other Ambulatory Visit: Payer: Self-pay

## 2021-07-11 ENCOUNTER — Encounter: Payer: Self-pay | Admitting: Diagnostic Neuroimaging

## 2021-07-11 MED ORDER — GABAPENTIN 100 MG PO CAPS: 100.0000 mg | ORAL_CAPSULE | Freq: Every day | ORAL | 1 refills | Status: DC

## 2021-07-15 ENCOUNTER — Encounter: Payer: Self-pay | Admitting: Diagnostic Neuroimaging

## 2021-07-18 ENCOUNTER — Other Ambulatory Visit: Payer: Self-pay | Admitting: *Deleted

## 2021-07-18 DIAGNOSIS — G35 Multiple sclerosis: Secondary | ICD-10-CM

## 2021-07-18 MED ORDER — METHYLPHENIDATE HCL 20 MG PO TABS: 20.0000 mg | ORAL_TABLET | Freq: Every day | ORAL | 0 refills | Status: DC

## 2021-07-20 ENCOUNTER — Other Ambulatory Visit: Payer: Self-pay | Admitting: Cardiology

## 2021-08-01 ENCOUNTER — Ambulatory Visit (INDEPENDENT_AMBULATORY_CARE_PROVIDER_SITE_OTHER): Payer: Medicare PPO | Admitting: Diagnostic Neuroimaging

## 2021-08-01 ENCOUNTER — Encounter: Payer: Self-pay | Admitting: Diagnostic Neuroimaging

## 2021-08-01 ENCOUNTER — Other Ambulatory Visit: Payer: Self-pay

## 2021-08-01 VITALS — BP 122/84 | HR 84 | Ht 62.5 in | Wt 173.5 lb

## 2021-08-01 DIAGNOSIS — G35 Multiple sclerosis: Secondary | ICD-10-CM

## 2021-08-01 MED ORDER — PRAMIPEXOLE DIHYDROCHLORIDE 0.25 MG PO TABS: 0.2500 mg | ORAL_TABLET | Freq: Every day | ORAL | 12 refills | Status: DC

## 2021-08-01 MED ORDER — GABAPENTIN 100 MG PO CAPS: 100.0000 mg | ORAL_CAPSULE | Freq: Every day | ORAL | 4 refills | Status: DC

## 2021-08-01 MED ORDER — MODAFINIL 200 MG PO TABS: 200.0000 mg | ORAL_TABLET | Freq: Every day | ORAL | 5 refills | Status: DC

## 2021-08-01 NOTE — Patient Instructions (Signed)
?  MULTIPLE SCLEROSIS (secondary progressive; initially on copaxone (2005), avonex, tysabri, then ocrevus since 2020) ?- continue ocrevus (Feb, Aug schedule) ? --> (Previously Dr. Jannifer Franklin had Ocrevus infused over 3 hours with 1 hour monitoring period afterward.  Also patient was receiving Solu-Medrol 100 mg premedication instead of 125 mg.  Patient does not want to take Pepcid that she takes daily Nexium.) ?- check CBC every 6 months; Ig panel annually ? ?FATIGUE ?- continue ritalin and provigil ? ?RESTLESS LEGS ?- continue pramipexole and gabapentin ?- iron deficiency replacement per PCP ?

## 2021-08-01 NOTE — Progress Notes (Signed)
? ?[facility] ? ?PATIENT: Summer Henderson ?DOB: March 18, 1961 ? ?REFERRING CLINICIAN: Elsie Amis* ?HISTORY FROM: patient  ?REASON FOR VISIT: follow up ? ? ?HISTORICAL ? ?CHIEF COMPLAINT:  ?Chief Complaint  ?Patient presents with  ? Follow-up  ?  RM with son 6 here for toc from Dr. Anne Hahn and f/u on MS. Pt reports she would like discuss why her iron pill will not be filled by our practice. She also would like to discuss her pre meds  ? ? ?HISTORY OF PRESENT ILLNESS:  ? ?UPDATE (08/01/21, VRP): Since last visit, here for transition of care. Dx'd with MS in 2005 (bladder sxs, fatigue). Then has had balance issue, blurred vision, confusion.  ? ?Since last visit, symptoms are stable. Tolerating ocrevus. Tolerating pramipexole (for restless sensation in left arm / torso). Tolerating ritalin for fatigue. Had been on provigil in the past (last filled 2020).  ? ?PRIOR HPI (02/03/21, Willis): Ms. Kilfoyle is a 61 year old right-handed white female with a history of secondary progressive multiple sclerosis.  The patient is on Ocrevus and tolerates the medication well.  She has a chronic gait disorder, she has not had any falls since last seen.  She reports some issues with restless leg syndrome but her Mirapex is quite effective to treat this.  She began having blood in her urine this morning, she does have a history of frequent urinary tract infections.  She has no history of renal calculi.  She had a sinus infection in mid August and was treated with antibiotics for this.  She has had some decreased hearing in her right ear with fluid behind the ear and some worsening of her gait.  The patient normally uses a cane or a walker for ambulation.  She comes in today for further evaluation. ? ?PRIOR HPI (07/08/20, Willis): Ms. Mccleod is a 61 year old right-handed white female with a history of multiple sclerosis.  The patient is on Ocrevus, she last got her dose in January 2022.  She tolerates the drug fairly  well.  She has not noted any definite new symptoms associated with the multiple sclerosis.  She does report some numbness in the hands, she has chronic weakness of the proximal muscles of the left leg.  She reports gait instability, she has not had any recent falls.  She has a neurogenic bladder that requires Botox injections, she has in and out catheterizations.  She is bothered significantly by a restless limb syndrome, her arms cause troubles at night, she has to move them vigorously to get rid of the sensation.  She is having trouble sleeping because of this.  She was recently taken off of Cymbalta and converted to Zoloft, the restless symptoms began shortly thereafter.  The patient has had ongoing problems with depression, she is followed through psychiatry.  She returns to the office today for an evaluation. ? ?REVIEW OF SYSTEMS: Full 14 system review of systems performed and negative with exception of: as per HPI.  ? ?ALLERGIES: ?Allergies  ?Allergen Reactions  ? Demerol [Meperidine] Other (See Comments)  ?  [derm] rash, swelling, itching ?Other reaction(s): Other (See Comments) ?[derm] rash, swelling, itching  ? Erythromycin   ? Morphine And Related   ? Sulfa Antibiotics   ? Diflucan [Fluconazole] Other (See Comments)  ?  States she has prolonged QT and cannot take this.  ? Other   ?  Other reaction(s): Other (See Comments) ?Z-pak causes EKG changes, was told by cardiologist not to take this.  ?  Sulfacetamide Sodium   ? Erythromycin Base Rash  ?  [Derm] rash  ? Fesoterodine Palpitations  ? Tape Other (See Comments)  ?  Adhesive tape ?And Steri Strips; Local reaction-burns skin  ? Toviaz [Fesoterodine Fumarate Er] Palpitations  ? ? ?HOME MEDICATIONS: ?Outpatient Medications Prior to Visit  ?Medication Sig Dispense Refill  ? linaclotide (LINZESS) 145 MCG CAPS capsule Take 145 mcg by mouth daily before breakfast.    ? amLODipine (NORVASC) 5 MG tablet TAKE 1 TABLET (5 MG TOTAL) BY MOUTH DAILY. 90 tablet 3  ?  aspirin 81 MG tablet Take 1 tablet (81 mg total) by mouth daily. 30 tablet   ? atorvastatin (LIPITOR) 80 MG tablet TAKE 1 TABLET (80 MG TOTAL) BY MOUTH DAILY AT 6 PM. 90 tablet 2  ? esomeprazole (NEXIUM) 40 MG capsule TAKE 1 CAPSULE (40 MG TOTAL) BY MOUTH DAILY AT 12 NOON. 90 capsule 1  ? ferrous gluconate (FERGON) 324 MG tablet TAKE 1 TABLET BY MOUTH DAILY WITH BREAKFAST 90 tablet 1  ? furosemide (LASIX) 20 MG tablet TAKE 2 TABLETS (40 MG TOTAL) BY MOUTH DAILY. 180 tablet 0  ? losartan (COZAAR) 100 MG tablet TAKE 1 TABLET BY MOUTH EVERY DAY 90 tablet 3  ? methocarbamol (ROBAXIN) 500 MG tablet Take 500 mg by mouth every 8 (eight) hours as needed for muscle spasms.    ? methylphenidate (RITALIN) 20 MG tablet Take 1 tablet (20 mg total) by mouth daily. At noon 30 tablet 0  ? metoprolol succinate (TOPROL-XL) 50 MG 24 hr tablet TAKE 1 TABLET BY MOUTH EVERY DAY 90 tablet 0  ? NASONEX 50 MCG/ACT nasal spray Place 50 sprays into the nose daily.    ? ocrelizumab 600 mg in sodium chloride 0.9 % 500 mL Inject 600 mg into the vein every 6 (six) months.    ? potassium chloride (KLOR-CON) 10 MEQ tablet TAKE 1 TABLET BY MOUTH EVERY DAY 90 tablet 2  ? sertraline (ZOLOFT) 50 MG tablet Take 50 mg by mouth daily.    ? clotrimazole (MYCELEX) 10 MG troche Take 1 tablet (10 mg total) by mouth 3 (three) times daily. 15 Troche 0  ? ENABLEX 7.5 MG 24 hr tablet Take 1 tablet by mouth daily.    ? gabapentin (NEURONTIN) 100 MG capsule Take 1 capsule (100 mg total) by mouth at bedtime. 90 capsule 1  ? modafinil (PROVIGIL) 200 MG tablet Take 1 tablet (200 mg total) by mouth daily. 90 tablet 3  ? nitrofurantoin, macrocrystal-monohydrate, (MACROBID) 100 MG capsule Take 1 capsule (100 mg total) by mouth 2 (two) times daily. 10 capsule 0  ? pramipexole (MIRAPEX) 0.25 MG tablet TAKE 1 TABLET BY MOUTH EVERYDAY AT BEDTIME 90 tablet 3  ? ?No facility-administered medications prior to visit.  ? ? ? ? ?PHYSICAL EXAM ? ?GENERAL  EXAM/CONSTITUTIONAL: ?Vitals:  ?Vitals:  ? 08/01/21 1413  ?BP: 122/84  ?Pulse: 84  ?SpO2: 92%  ?Weight: 173 lb 8 oz (78.7 kg)  ?Height: 5' 2.5" (1.588 m)  ? ?Body mass index is 31.23 kg/m?. ?Wt Readings from Last 3 Encounters:  ?08/01/21 173 lb 8 oz (78.7 kg)  ?03/09/21 162 lb 12.8 oz (73.8 kg)  ?02/03/21 161 lb (73 kg)  ? ?Patient is in no distress; well developed, nourished and groomed; neck is supple ? ?CARDIOVASCULAR: ?Examination of carotid arteries is normal; no carotid bruits ?Regular rate and rhythm, no murmurs ?Examination of peripheral vascular system by observation and palpation is normal ? ?EYES: ?Ophthalmoscopic exam of optic  discs and posterior segments is normal; no papilledema or hemorrhages ?No results found. ? ?MUSCULOSKELETAL: ?Gait, strength, tone, movements noted in Neurologic exam below ? ?NEUROLOGIC: ?MENTAL STATUS:  ?   ? View : No data to display.  ?  ?  ?  ? ?awake, alert, oriented to person, place and time ?recent and remote memory intact ?normal attention and concentration ?language fluent, comprehension intact, naming intact ?fund of knowledge appropriate ? ?CRANIAL NERVE:  ?2nd - no papilledema on fundoscopic exam ?2nd, 3rd, 4th, 6th - pupils equal and reactive to light, visual fields full to confrontation, extraocular muscles intact, no nystagmus ?5th - facial sensation symmetric ?7th - facial strength symmetric ?8th - hearing intact ?9th - palate elevates symmetrically, uvula midline ?11th - shoulder shrug symmetric ?12th - tongue protrusion midline ? ?MOTOR:  ?normal bulk and tone, full strength in the BUE, RLE ?LLE 3-4; INCREASED TONE ? ?SENSORY:  ?normal and symmetric to light touch, temperature, vibration; DECR IN LEFT LEG ? ?COORDINATION:  ?finger-nose-finger, fine finger movements normal ? ?REFLEXES:  ?deep tendon reflexes BRISK IN LEFT LEG; CLONUS IN LEFT ANKLE ? ?GAIT/STATION:  ?narrow based gait; UNSTEADY WITH LEGS TOGETHER AND EYES OPEN ? ? ? ? ?DIAGNOSTIC DATA (LABS,  IMAGING, TESTING) ?- I reviewed patient records, labs, notes, testing and imaging myself where available. ? ?Lab Results  ?Component Value Date  ? WBC 15.7 (H) 02/03/2021  ? HGB 15.0 02/03/2021  ? HCT 42.4 02/03/2021  ? MCV 92 02/03/2021

## 2021-08-02 ENCOUNTER — Telehealth: Payer: Self-pay

## 2021-08-02 ENCOUNTER — Encounter: Payer: Self-pay | Admitting: *Deleted

## 2021-08-02 NOTE — Telephone Encounter (Signed)
PA for modafinil has been sent. ? ? ?(Key: [id number]) ? ?Your information has been sent to [facility]. ?

## 2021-08-02 NOTE — Telephone Encounter (Signed)
Modafinil approved. PA Case: [id number], Status: Approved, Coverage Starts on: 05/08/2021 12:00:00 AM, Coverage Ends on: 05/07/2022 12:00:00 AM. Questions? Contact [phone].  ?

## 2021-08-03 ENCOUNTER — Ambulatory Visit: Payer: Medicare PPO | Admitting: Neurology

## 2021-08-09 ENCOUNTER — Other Ambulatory Visit (HOSPITAL_BASED_OUTPATIENT_CLINIC_OR_DEPARTMENT_OTHER): Payer: Self-pay

## 2021-08-09 ENCOUNTER — Emergency Department (HOSPITAL_BASED_OUTPATIENT_CLINIC_OR_DEPARTMENT_OTHER): Payer: Medicare PPO

## 2021-08-09 ENCOUNTER — Emergency Department (HOSPITAL_BASED_OUTPATIENT_CLINIC_OR_DEPARTMENT_OTHER)
Admission: EM | Admit: 2021-08-09 | Discharge: 2021-08-09 | Disposition: A | Discharge status: Home / Self Care | Payer: Medicare PPO | Attending: Emergency Medicine | Admitting: Emergency Medicine

## 2021-08-09 ENCOUNTER — Other Ambulatory Visit: Payer: Self-pay

## 2021-08-09 ENCOUNTER — Encounter (HOSPITAL_BASED_OUTPATIENT_CLINIC_OR_DEPARTMENT_OTHER): Payer: Self-pay | Admitting: *Deleted

## 2021-08-09 DIAGNOSIS — R0789 Other chest pain: Secondary | ICD-10-CM | POA: Diagnosis not present

## 2021-08-09 DIAGNOSIS — Z79899 Other long term (current) drug therapy: Secondary | ICD-10-CM | POA: Insufficient documentation

## 2021-08-09 DIAGNOSIS — R059 Cough, unspecified: Secondary | ICD-10-CM | POA: Insufficient documentation

## 2021-08-09 DIAGNOSIS — I10 Essential (primary) hypertension: Secondary | ICD-10-CM | POA: Insufficient documentation

## 2021-08-09 DIAGNOSIS — Z7982 Long term (current) use of aspirin: Secondary | ICD-10-CM | POA: Diagnosis not present

## 2021-08-09 MED ORDER — HYDROCODONE-ACETAMINOPHEN 5-325 MG PO TABS
1.0000 | ORAL_TABLET | Freq: Four times a day (QID) | ORAL | 0 refills | Status: DC | PRN
  Filled 2021-08-09: qty 14, 4d supply, fill #0

## 2021-08-09 MED ORDER — ALBUTEROL SULFATE HFA 108 (90 BASE) MCG/ACT IN AERS
2.0000 | INHALATION_SPRAY | Freq: Four times a day (QID) | RESPIRATORY_TRACT | 0 refills | Status: AC | PRN
  Filled 2021-08-09: qty 18, 25d supply, fill #0

## 2021-08-09 NOTE — ED Triage Notes (Incomplete)
Reports cough and sinus issues for several weeks. States she saw her ENT on 3/16 and was given Augmentin. States still has cough. She saw ENT yesterday and was given steroid shot. Reports increasing pain left rib over the last few days (states she has dislocated rib in the past) which got worse yesterday "due to coughing". Voices concern for rib being dislocated again ?

## 2021-08-09 NOTE — Discharge Instructions (Addendum)
Use your albuterol inhaler that you have at home 2 puffs every 6 hours.  Take the hydrocodone as needed for pain.  Follow-up with your doctors.  Return for any new or worse symptoms. ?

## 2021-08-09 NOTE — ED Provider Notes (Addendum)
?[facility] EMERGENCY DEPARTMENT ?Provider Note ? ? ?CSN: [id number] ?Arrival date & time: 08/09/21  1314 ? ?  ? ?History ? ?Chief Complaint  ?Patient presents with  ? Cough  ? ? ?Summer Henderson is a 61 y.o. female. ? ?Patient with a complaint of left lateral rib area pain with coughing and taking deep breath she has been having difficulty with a productive cough now for several weeks.  Seen by ear nose and throat doctors been on a course of steroids and a course of antibiotics but not currently taking antibiotics for sinus problem.  No fall or injury.  Oxygen saturation 97% on room air.   ? ?Past medical history is significant for multiple sclerosis chronic fatigue cervical dysplasia grafts are esophageal reflux disease carpal tunnel syndrome prolonged QT hypertension, mitral valve prolapse anxiety depression coronary disease restless leg syndrome.  Surgical history of significance abdominal hysterectomy.  Patient is an everyday smoker. ? ? ?  ? ?Home Medications ?Prior to Admission medications   ?Medication Sig Start Date End Date Taking? Authorizing Provider  ?albuterol (VENTOLIN HFA) 108 (90 Base) MCG/ACT inhaler Inhale 2 puffs into the lungs every 6 (six) hours as needed for wheezing or shortness of breath. 08/09/21  Yes Vanetta Mulders, MD  ?HYDROcodone-acetaminophen (NORCO/VICODIN) 5-325 MG tablet Take 1 tablet by mouth every 6 (six) hours as needed for moderate pain. 08/09/21  Yes Vanetta Mulders, MD  ?amLODipine (NORVASC) 5 MG tablet TAKE 1 TABLET (5 MG TOTAL) BY MOUTH DAILY. 04/20/21 07/19/21  Lewayne Bunting, MD  ?aspirin 81 MG tablet Take 1 tablet (81 mg total) by mouth daily. 07/24/14   Lewayne Bunting, MD  ?atorvastatin (LIPITOR) 80 MG tablet TAKE 1 TABLET (80 MG TOTAL) BY MOUTH DAILY AT 6 PM. 11/24/20   Lewayne Bunting, MD  ?esomeprazole (NEXIUM) 40 MG capsule TAKE 1 CAPSULE (40 MG TOTAL) BY MOUTH DAILY AT 12 NOON. 02/16/21   Lewayne Bunting, MD  ?ferrous gluconate (FERGON) 324 MG tablet  TAKE 1 TABLET BY MOUTH DAILY WITH BREAKFAST 01/03/21   York Spaniel, MD  ?furosemide (LASIX) 20 MG tablet TAKE 2 TABLETS (40 MG TOTAL) BY MOUTH DAILY. 07/04/21   Lewayne Bunting, MD  ?gabapentin (NEURONTIN) 100 MG capsule Take 1 capsule (100 mg total) by mouth at bedtime. 08/01/21   Penumalli, Glenford Bayley, MD  ?linaclotide (LINZESS) 145 MCG CAPS capsule Take 145 mcg by mouth daily before breakfast.    [provider]  ?losartan (COZAAR) 100 MG tablet TAKE 1 TABLET BY MOUTH EVERY DAY 07/20/21   Lewayne Bunting, MD  ?methocarbamol (ROBAXIN) 500 MG tablet Take 500 mg by mouth every 8 (eight) hours as needed for muscle spasms.    [provider]  ?methylphenidate (RITALIN) 20 MG tablet Take 1 tablet (20 mg total) by mouth daily. At noon 07/18/21   Penumalli, Glenford Bayley, MD  ?metoprolol succinate (TOPROL-XL) 50 MG 24 hr tablet TAKE 1 TABLET BY MOUTH EVERY DAY 07/04/21   Lewayne Bunting, MD  ?modafinil (PROVIGIL) 200 MG tablet Take 1 tablet (200 mg total) by mouth daily. 08/01/21   Penumalli, Glenford Bayley, MD  ?NASONEX 50 MCG/ACT nasal spray Place 50 sprays into the nose daily. 09/16/12   [provider]  ?ocrelizumab 600 mg in sodium chloride 0.9 % 500 mL Inject 600 mg into the vein every 6 (six) months.    [provider]  ?potassium chloride (KLOR-CON) 10 MEQ tablet TAKE 1 TABLET BY MOUTH EVERY DAY  11/24/20   Lewayne Bunting, MD  ?pramipexole (MIRAPEX) 0.25 MG tablet Take 1 tablet (0.25 mg total) by mouth at bedtime. 08/01/21   Penumalli, Glenford Bayley, MD  ?sertraline (ZOLOFT) 50 MG tablet Take 50 mg by mouth daily.    [provider]  ?   ? ?Allergies    ?Demerol [meperidine], Erythromycin, Morphine and related, Sulfa antibiotics, Diflucan [fluconazole], Other, Sulfacetamide sodium, Erythromycin base, Fesoterodine, Tape, and Toviaz [fesoterodine fumarate er]   ? ?Review of Systems   ?Review of Systems  ?Constitutional:  Negative for chills and fever.  ?HENT:  Positive for congestion.  Negative for rhinorrhea and sore throat.   ?Eyes:  Negative for visual disturbance.  ?Respiratory:  Positive for cough. Negative for shortness of breath.   ?Cardiovascular:  Positive for chest pain. Negative for leg swelling.  ?Gastrointestinal:  Negative for abdominal pain, diarrhea, nausea and vomiting.  ?Genitourinary:  Negative for dysuria.  ?Musculoskeletal:  Negative for back pain and neck pain.  ?Skin:  Negative for rash.  ?Neurological:  Negative for dizziness, light-headedness and headaches.  ?Hematological:  Does not bruise/bleed easily.  ?Psychiatric/Behavioral:  Negative for confusion.   ? ?Physical Exam ?Updated Vital Signs ?BP 95/68   Pulse 63   Temp 98.1 ?F (36.7 ?C) (Oral)   Resp 18   Ht 1.588 m (5' 2.5")   Wt 77.1 kg   SpO2 96%   BMI 30.60 kg/m?  ?Physical Exam ?Vitals and nursing note reviewed.  ?Constitutional:   ?   General: She is not in acute distress. ?   Appearance: Normal appearance. She is well-developed.  ?HENT:  ?   Head: Normocephalic and atraumatic.  ?Eyes:  ?   Conjunctiva/sclera: Conjunctivae normal.  ?   Pupils: Pupils are equal, round, and reactive to light.  ?Cardiovascular:  ?   Rate and Rhythm: Normal rate and regular rhythm.  ?   Heart sounds: No murmur heard. ?Pulmonary:  ?   Effort: Pulmonary effort is normal. No respiratory distress.  ?   Breath sounds: Normal breath sounds. No wheezing, rhonchi or rales.  ?Chest:  ?   Chest wall: Tenderness present.  ?Abdominal:  ?   Palpations: Abdomen is soft.  ?   Tenderness: There is no abdominal tenderness.  ?Musculoskeletal:     ?   General: No swelling.  ?   Cervical back: Normal range of motion and neck supple.  ?Skin: ?   General: Skin is warm and dry.  ?   Capillary Refill: Capillary refill takes less than 2 seconds.  ?Neurological:  ?   General: No focal deficit present.  ?   Mental Status: She is alert and oriented to person, place, and time.  ?Psychiatric:     ?   Mood and Affect: Mood normal.  ? ? ?ED Results /  Procedures / Treatments   ?Labs ?(all labs ordered are listed, but only abnormal results are displayed) ?Labs Reviewed - No data to display ? ?EKG ?EKG Interpretation ? ?Date/Time:  Tuesday August 09 2021 13:46:23 EDT ?Ventricular Rate:  68 ?PR Interval:  154 ?QRS Duration: 86 ?QT Interval:  431 ?QTC Calculation: 459 ?R Axis:   60 ?Text Interpretation: Sinus rhythm Abnormal R-wave progression, early transition Borderline T abnormalities, anterior leads No significant change since last tracing Confirmed by Vanetta Mulders [phone]) on 08/09/2021 1:54:09 PM ? ?Radiology ?DG Ribs Unilateral W/Chest Left ? ?Result Date: 08/09/2021 ?CLINICAL DATA:  LEFT anterior to lateral chest pain with coughing and T4 a deep breath,  rib injury, history coronary artery disease, smoker, GERD, hypertension EXAM: LEFT RIBS AND CHEST - 3+ VIEW COMPARISON:  Chest radiograph 11/09/2018 FINDINGS: Normal heart size, mediastinal contours, and pulmonary vascularity. Chronic peribronchial thickening with mild LEFT basilar atelectasis. No pulmonary infiltrate, pleural effusion, or pneumothorax. Bones diffusely demineralized. BB placed at site of symptoms lower LEFT ribs. No rib fracture or bone destruction seen. Prior cervical spine fusion. IMPRESSION: Bronchitic changes with of basilar atelectasis. No acute LEFT rib abnormalities. Electronically Signed   By: Ulyses Southward M.D.   On: 08/09/2021 14:40   ? ?Procedures ?Procedures  ? ? ?Medications Ordered in ED ?Medications - No data to display ? ?ED Course/ Medical Decision Making/ A&P ?  ?                        ?Medical Decision Making ?Amount and/or Complexity of Data Reviewed ?Radiology: ordered. ? ?Risk ?Prescription drug management. ? ? ?We will get chest x-ray with left-sided ribs. ? ?Patient's lungs are clear bilaterally.  There is point tenderness around the seventh rib on the left side. ? ?Chest x-ray with rib series just consistent with bronchitis.  No acute findings.  No evidence of any rib  fractures.  Clinically patient has soreness in that area.  We will treat with pain medication and asked patient about using albuterol inhaler and have her follow-up with her doctors.  ? ?Certainly medically patient has si

## 2021-08-12 ENCOUNTER — Other Ambulatory Visit: Payer: Self-pay | Admitting: Cardiology

## 2021-08-12 DIAGNOSIS — E785 Hyperlipidemia, unspecified: Secondary | ICD-10-CM

## 2021-08-14 ENCOUNTER — Other Ambulatory Visit: Payer: Self-pay | Admitting: Cardiology

## 2021-08-14 DIAGNOSIS — R601 Generalized edema: Secondary | ICD-10-CM

## 2021-08-15 ENCOUNTER — Encounter: Payer: Self-pay | Admitting: Cardiology

## 2021-08-15 MED ORDER — ESOMEPRAZOLE MAGNESIUM 40 MG PO CPDR: 40.0000 mg | DELAYED_RELEASE_CAPSULE | Freq: Every day | ORAL | 1 refills | Status: DC

## 2021-08-23 ENCOUNTER — Other Ambulatory Visit: Payer: Self-pay | Admitting: Diagnostic Neuroimaging

## 2021-08-23 DIAGNOSIS — G35 Multiple sclerosis: Secondary | ICD-10-CM

## 2021-08-23 MED ORDER — METHYLPHENIDATE HCL 20 MG PO TABS: 20.0000 mg | ORAL_TABLET | Freq: Every day | ORAL | 0 refills | Status: DC

## 2021-08-23 NOTE — Telephone Encounter (Signed)
Pt requesting refill for Methylphenidate 20 mg. Per [place] drug registry last refill was 07/18/2021 # 30 for a 30 day supply.  ?Last visit was 08/01/2021  ?

## 2021-08-23 NOTE — Telephone Encounter (Signed)
Pt is requesting a refill for methylphenidate (RITALIN) 20 MG tablet.  Pharmacy: [id number]   

## 2021-09-02 NOTE — Progress Notes (Signed)
? ? ? ? ?HPI:FU CAD, hypertension, prolonged QT and mitral valve prolapse. Holter monitor in September of 2012 showed sinus rhythm with rare PVC. Patient had an abdominal CT for abdominal pain in December 2015. 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 Nuclear study January 2016 showed an ejection fraction of 56%, probable mild breast attenuation with possible mild mid anteroseptal ischemia. Cardiac catheterization February 2016 showed 80% LAD and 60-70% RCA. Ejection fraction 55-65%. The patient had PCI of the LAD and RCA. Echocardiogram September 2020 showed normal LV function, grade 1 diastolic dysfunction.  Venous Dopplers September 2020 showed no DVT.  Chest CT February 2023 showed aortic atherosclerosis, coronary artery disease and emphysema.  Since she was last seen, she has dyspnea with more vigorous activities.  No orthopnea.  She occasionally has pedal edema.  No exertional chest pain or syncope. ? ?Current Outpatient Medications  ?Medication Sig Dispense Refill  ? albuterol (VENTOLIN HFA) 108 (90 Base) MCG/ACT inhaler Inhale 2 puffs by mouth into the lungs every 6 (six) hours as needed for wheezing or shortness of breath. 18 g 0  ? aspirin 81 MG chewable tablet Chew 1 tablet by mouth daily.    ? atorvastatin (LIPITOR) 80 MG tablet Take 1 tablet by mouth daily.    ? Azelastine HCl 137 MCG/SPRAY SOLN Place 1 spray into both nostrils daily.    ? diphenhydrAMINE (BENADRYL) 50 MG capsule Take 1 capsule by mouth as needed.    ? esomeprazole (NEXIUM) 40 MG capsule Take 1 capsule (40 mg total) by mouth daily at 12 noon. 90 capsule 1  ? ferrous gluconate (FERGON) 324 MG tablet Take 1 tablet by mouth every other day.    ? furosemide (LASIX) 20 MG tablet TAKE 2 TABLETS (40 MG TOTAL) BY MOUTH DAILY. 180 tablet 0  ? gabapentin (NEURONTIN) 100 MG capsule Take 1 capsule (100 mg total) by mouth at bedtime. 90 capsule 4  ? HYDROcodone-acetaminophen (NORCO/VICODIN) 5-325  MG tablet Take 1 tablet by mouth every 6 (six) hours as needed for moderate pain. 14 tablet 0  ? linaclotide (LINZESS) 145 MCG CAPS capsule Take 145 mcg by mouth daily before breakfast.    ? losartan (COZAAR) 100 MG tablet Take 1 tablet by mouth daily.    ? methocarbamol (ROBAXIN) 500 MG tablet Take 500 mg by mouth every 8 (eight) hours as needed for muscle spasms.    ? methylphenidate (RITALIN) 20 MG tablet Take 1 tablet (20 mg total) by mouth daily. At noon 30 tablet 0  ? metoprolol succinate (TOPROL-XL) 50 MG 24 hr tablet TAKE 1 TABLET BY MOUTH EVERY DAY 90 tablet 0  ? mirabegron ER (MYRBETRIQ) 50 MG TB24 tablet Take 1 tablet by mouth daily.    ? modafinil (PROVIGIL) 200 MG tablet Take 1 tablet (200 mg total) by mouth daily. 30 tablet 5  ? NASONEX 50 MCG/ACT nasal spray Place 50 sprays into the nose daily.    ? ocrelizumab 600 mg in sodium chloride 0.9 % 500 mL Inject 600 mg into the vein every 6 (six) months.    ? potassium chloride (KLOR-CON) 10 MEQ tablet TAKE 1 TABLET BY MOUTH EVERY DAY 90 tablet 1  ? pramipexole (MIRAPEX) 0.25 MG tablet Take 1 tablet (0.25 mg total) by mouth at bedtime. 30 tablet 12  ? sertraline (ZOLOFT) 50 MG tablet Take 1 tablet by mouth daily.    ? amLODipine (NORVASC) 5 MG tablet TAKE 1 TABLET (5 MG TOTAL) BY  MOUTH DAILY. 90 tablet 3  ? ?No current facility-administered medications for this visit.  ? ? ? ?Past Medical History:  ?Diagnosis Date  ? Anxiety   ? Arthritis   ? CAD (coronary artery disease)   ? a. 06/2014 abnl MV;  b. 06/2014 Cath: LM nl, LAD 73m (2.75x28 Promus DES), LCX nl, RCA 60-73m (FFR = 0.80 -> 3.0x24 Promus DES), EF 55-65%.  ? Carpal tunnel syndrome   ? Cervical dysplasia   ? Chronic fatigue   ? Depression   ? GERD (gastroesophageal reflux disease)   ? Hypertension   ? Mitral valve prolapse   ? Multiple sclerosis (HCC)   ? Pneumonia   ? Prolonged QT interval   ? RLS (restless legs syndrome) 04/28/2015  ? ? ?Past Surgical History:  ?Procedure Laterality Date  ? ABDOMINAL  HYSTERECTOMY    ? BICEPS TENDON REPAIR    ? bladder resuspension    ? CARPAL TUNNEL RELEASE    ? CERVICAL SPINE SURGERY  10-16-2009  ? CORONARY ANGIOPLASTY WITH STENT PLACEMENT    ? DILATION AND CURETTAGE OF UTERUS    ? FOOT SURGERY Bilateral   ? For bunions  ? HAMMER TOE SURGERY Left   ? LEFT HEART CATHETERIZATION WITH CORONARY ANGIOGRAM N/A 06/29/2014  ? Procedure: LEFT HEART CATHETERIZATION WITH CORONARY ANGIOGRAM;  Surgeon: Micheline Chapman, MD;  Location: [facility] CATH LAB;  Service: Cardiovascular;  Laterality: N/A;  ? ORIF TOE FRACTURE Right   ? Prolong QTC    ? Heart  ? rectovaginal fistula repair    ? ROTATOR CUFF REPAIR Left   ? ? ?Social History  ? ?Socioeconomic History  ? Marital status: Divorced  ?  Spouse name: Not on file  ? Number of children: 2  ? Years of education: 55  ? Highest education level: Not on file  ?Occupational History  ? Occupation: Disabled  ?  Comment: Disability  ?Tobacco Use  ? Smoking status: Every Day  ?  Packs/day: 1.00  ?  Years: 35.00  ?  Pack years: 35.00  ?  Types: Cigarettes  ?  Last attempt to quit: 06/30/2014  ?  Years since quitting: 7.2  ? Smokeless tobacco: Never  ?Vaping Use  ? Vaping Use: Never used  ?Substance and Sexual Activity  ? Alcohol use: No  ?  Alcohol/week: 0.0 standard drinks  ? Drug use: No  ? Sexual activity: Not on file  ?Other Topics Concern  ? Not on file  ?Social History Narrative  ? Patient lives with son  ? Patient does not drink caffeine.  ? Patient is right handed.  ? ?Social Determinants of Health  ? ?Financial Resource Strain: Not on file  ?Food Insecurity: Not on file  ?Transportation Needs: Not on file  ?Physical Activity: Not on file  ?Stress: Not on file  ?Social Connections: Not on file  ?Intimate Partner Violence: Not on file  ? ? ?Family History  ?Problem Relation Age of Onset  ? Melanoma Mother   ? Colon polyps Mother   ? Diabetes Mother   ? Breast cancer Maternal Grandmother   ? Colon cancer Maternal Grandmother   ? Brain cancer Maternal  Grandfather   ? Lung cancer Maternal Grandfather   ? Diabetes Maternal Grandfather   ? Kidney disease Neg Hx   ? Esophageal cancer Neg Hx   ? Gallbladder disease Neg Hx   ? ? ?ROS: Fatigue and back pain but no fevers or chills, productive cough, hemoptysis, dysphasia, odynophagia, melena,  hematochezia, dysuria, hematuria, rash, seizure activity, orthopnea, PND, pedal edema, claudication. Remaining systems are negative. ? ?Physical Exam: ?Well-developed well-nourished in no acute distress.  ?Skin is warm and dry.  ?HEENT is normal.  ?Neck is supple.  ?Chest with diminished breath sounds ?Cardiovascular exam is regular rate and rhythm.  ?Abdominal exam nontender or distended. No masses palpated. ?Extremities show no edema. ?neuro grossly intact ? ?A/P ? ?1 CAD-No CP; continue ASA and statin.  ? ?2 hyperlipidemia-continue statin.  Check lipids and liver. ? ?3 hypertension-patient's blood pressure is controlled.  Continue present medications. ? ?4 chronic diastolic congestive heart failure-euvolemic on examination.  Continue diuretic at present dose.  Check potassium and renal function. ? ?5 tobacco abuse-patient counseled on discontinuing. ? ?6 prolonged QT interval-no history of syncope. ? ?Olga Millers, MD ? ? ? ?

## 2021-09-06 ENCOUNTER — Other Ambulatory Visit: Payer: Self-pay | Admitting: Cardiology

## 2021-09-06 DIAGNOSIS — E785 Hyperlipidemia, unspecified: Secondary | ICD-10-CM

## 2021-09-14 ENCOUNTER — Other Ambulatory Visit: Payer: Self-pay | Admitting: *Deleted

## 2021-09-14 ENCOUNTER — Ambulatory Visit (INDEPENDENT_AMBULATORY_CARE_PROVIDER_SITE_OTHER): Payer: Medicare PPO | Admitting: Cardiology

## 2021-09-14 ENCOUNTER — Encounter: Payer: Self-pay | Admitting: Cardiology

## 2021-09-14 VITALS — BP 124/77 | HR 78 | Ht 62.5 in | Wt 173.0 lb

## 2021-09-14 DIAGNOSIS — R601 Generalized edema: Secondary | ICD-10-CM | POA: Diagnosis not present

## 2021-09-14 DIAGNOSIS — I10 Essential (primary) hypertension: Secondary | ICD-10-CM | POA: Diagnosis not present

## 2021-09-14 DIAGNOSIS — I2510 Atherosclerotic heart disease of native coronary artery without angina pectoris: Secondary | ICD-10-CM | POA: Diagnosis not present

## 2021-09-14 DIAGNOSIS — E785 Hyperlipidemia, unspecified: Secondary | ICD-10-CM

## 2021-09-14 MED ORDER — ATORVASTATIN CALCIUM 80 MG PO TABS: 80.0000 mg | ORAL_TABLET | Freq: Every day | ORAL | 3 refills | Status: DC

## 2021-09-14 MED ORDER — LOSARTAN POTASSIUM 100 MG PO TABS: 100.0000 mg | ORAL_TABLET | Freq: Every day | ORAL | 3 refills | Status: DC

## 2021-09-14 MED ORDER — AMLODIPINE BESYLATE 5 MG PO TABS: 5.0000 mg | ORAL_TABLET | Freq: Every day | ORAL | 3 refills | Status: DC

## 2021-09-14 MED ORDER — POTASSIUM CHLORIDE ER 10 MEQ PO TBCR: 10.0000 meq | EXTENDED_RELEASE_TABLET | Freq: Every day | ORAL | 3 refills | Status: DC

## 2021-09-14 MED ORDER — ESOMEPRAZOLE MAGNESIUM 40 MG PO CPDR: 40.0000 mg | DELAYED_RELEASE_CAPSULE | Freq: Every day | ORAL | 1 refills | Status: DC

## 2021-09-14 MED ORDER — FUROSEMIDE 20 MG PO TABS: 40.0000 mg | ORAL_TABLET | Freq: Every day | ORAL | 3 refills | Status: DC

## 2021-09-14 MED ORDER — METOPROLOL SUCCINATE ER 50 MG PO TB24: 50.0000 mg | ORAL_TABLET | Freq: Every day | ORAL | 3 refills | Status: DC

## 2021-09-14 NOTE — Patient Instructions (Signed)

## 2021-09-15 LAB — COMPREHENSIVE METABOLIC PANEL
ALT: 25 IU/L (ref 0–32)
AST: 25 IU/L (ref 0–40)
Albumin/Globulin Ratio: 2 (ref 1.2–2.2)
Albumin: 4.1 g/dL (ref 3.8–4.9)
Alkaline Phosphatase: 156 IU/L — ABNORMAL HIGH (ref 44–121)
BUN/Creatinine Ratio: 7 — ABNORMAL LOW (ref 9–23)
BUN: 6 mg/dL (ref 6–24)
Bilirubin Total: 0.4 mg/dL (ref 0.0–1.2)
CO2: 21 mmol/L (ref 20–29)
Calcium: 9 mg/dL (ref 8.7–10.2)
Chloride: 102 mmol/L (ref 96–106)
Creatinine, Ser: 0.82 mg/dL (ref 0.57–1.00)
Globulin, Total: 2.1 g/dL (ref 1.5–4.5)
Glucose: 136 mg/dL — ABNORMAL HIGH (ref 70–99)
Potassium: 3.6 mmol/L (ref 3.5–5.2)
Sodium: 141 mmol/L (ref 134–144)
Total Protein: 6.2 g/dL (ref 6.0–8.5)
eGFR: 82 mL/min/{1.73_m2} (ref >59)

## 2021-09-15 LAB — LIPID PANEL
Chol/HDL Ratio: 3.1 ratio (ref 0.0–4.4)
Cholesterol, Total: 129 mg/dL (ref 100–199)
HDL: 42 mg/dL (ref >39)
LDL Chol Calc (NIH): 59 mg/dL (ref 0–99)
Triglycerides: 163 mg/dL — ABNORMAL HIGH (ref 0–149)
VLDL Cholesterol Cal: 28 mg/dL (ref 5–40)

## 2021-09-16 ENCOUNTER — Encounter: Payer: Self-pay | Admitting: *Deleted

## 2021-09-21 ENCOUNTER — Other Ambulatory Visit: Payer: Self-pay | Admitting: Diagnostic Neuroimaging

## 2021-09-21 ENCOUNTER — Other Ambulatory Visit: Payer: Self-pay

## 2021-09-21 DIAGNOSIS — G35 Multiple sclerosis: Secondary | ICD-10-CM

## 2021-09-21 MED ORDER — METHYLPHENIDATE HCL 20 MG PO TABS: 20.0000 mg | ORAL_TABLET | Freq: Every day | ORAL | 0 refills | Status: DC

## 2021-09-21 NOTE — Telephone Encounter (Signed)
Pt is requesting a refill for methylphenidate (RITALIN). ? ?Pharmacy:  [facility] [phone]  ? ?

## 2021-09-21 NOTE — Telephone Encounter (Signed)
[place] drug registry checked, last filled 08/23/21, pt last seen 08/01/21, has upcoming appt 02/02/22. Refill is appropriate ?

## 2021-10-02 ENCOUNTER — Other Ambulatory Visit: Payer: Self-pay | Admitting: Cardiology

## 2021-10-02 DIAGNOSIS — R601 Generalized edema: Secondary | ICD-10-CM

## 2021-10-25 ENCOUNTER — Other Ambulatory Visit: Payer: Self-pay | Admitting: Diagnostic Neuroimaging

## 2021-10-25 DIAGNOSIS — G35 Multiple sclerosis: Secondary | ICD-10-CM

## 2021-10-25 NOTE — Telephone Encounter (Signed)
Pt called needing a refill request for her methylphenidate (RITALIN) 20 MG tablet sent to the [facility] in [facility]

## 2021-10-26 ENCOUNTER — Encounter: Payer: Self-pay | Admitting: Diagnostic Neuroimaging

## 2021-10-26 MED ORDER — METHYLPHENIDATE HCL 20 MG PO TABS: 20.0000 mg | ORAL_TABLET | Freq: Every day | ORAL | 0 refills | Status: DC

## 2021-11-28 ENCOUNTER — Other Ambulatory Visit: Payer: Self-pay | Admitting: Diagnostic Neuroimaging

## 2021-11-28 DIAGNOSIS — G35 Multiple sclerosis: Secondary | ICD-10-CM

## 2021-11-28 MED ORDER — METHYLPHENIDATE HCL 20 MG PO TABS: 20.0000 mg | ORAL_TABLET | Freq: Every day | ORAL | 0 refills | Status: DC

## 2021-11-28 NOTE — Telephone Encounter (Signed)
I reviewed drug registry.  Last refill for this medication was 10/26/2021 3 30 for a 30 day supply. Last f/u was March of this year. Will send to MD. Please disregard the routing comments on this note.

## 2021-11-28 NOTE — Telephone Encounter (Signed)
Pt is requesting a refill for methylphenidate (RITALIN) 20 MG.  Pharmacy: [facility] [phone]

## 2021-12-05 ENCOUNTER — Telehealth: Payer: Self-pay | Admitting: *Deleted

## 2021-12-05 NOTE — Telephone Encounter (Signed)
[facility]ed [facility], spoke with Lupita Leash who stated it needs PA. I advised her we have record of one good for year 2023. She stated still shows it needs PA.  Initiated PA on phone. Approved 05/08/21 - 05/07/22. Auth # [id number].  Glennon Hamilton with home infusion, advised him of above, gave him auth #.

## 2021-12-05 NOTE — Telephone Encounter (Addendum)
Received call form Summer Henderson S/ [facility], he stated insurance is rejecting refilling her Ocrevus: Rejecting because it exceeds adult maximum dose, advised I call drug plan and  have exceeds adult max dose error message added to authorization. I also spoke with Pharmacist, Amy.  Phone [phone] Mem ID- [id number] Ndc [id number]  ocrevus 300 mg vials, needs two vials. Her next infusion is on Thurs.

## 2021-12-06 NOTE — Telephone Encounter (Signed)
Received call from Kathlene November who stated patient had question but didn't want to call us herself. Her typical nurse can't come at usual time on Thurs but can come 2 hours later. Patient is refusing. Per Kathlene November, She can be infused next Tues, and patient wanting to know if it's ok to wait till Tues. I advised  that it probably is fine but will ask MD to be certain. I will call him back. Kathlene November verbalized understanding, appreciation.

## 2021-12-06 NOTE — Telephone Encounter (Signed)
Called Summer Henderson and informed him Dr Marjory Lies stated he is fine with infusion being done on Tues. Summer Henderson verbalized understanding, appreciation.

## 2021-12-28 ENCOUNTER — Other Ambulatory Visit: Payer: Self-pay | Admitting: Diagnostic Neuroimaging

## 2021-12-28 DIAGNOSIS — G35 Multiple sclerosis: Secondary | ICD-10-CM

## 2021-12-28 MED ORDER — METHYLPHENIDATE HCL 20 MG PO TABS: 20.0000 mg | ORAL_TABLET | Freq: Every day | ORAL | 0 refills | Status: DC

## 2021-12-28 NOTE — Telephone Encounter (Signed)
[place] Drug registry checked, pt last seen 08/01/21, Rx last filled 11/29/2021, pt has upcoming appt 02/02/22.  Refill is appropriate on my end. Please advise.

## 2021-12-28 NOTE — Telephone Encounter (Signed)
Pt is calling and requesting a refill on methylphenidate (RITALIN) 20 MG tablet. Pt is requesting prescription to sent to [id number] .

## 2022-01-27 ENCOUNTER — Telehealth: Payer: Self-pay | Admitting: Diagnostic Neuroimaging

## 2022-01-27 NOTE — Telephone Encounter (Signed)
Pt is needing a refill request for her methylphenidate (RITALIN) 20 MG tablet sent to the [facility] in [facility]

## 2022-01-28 ENCOUNTER — Encounter: Payer: Self-pay | Admitting: Diagnostic Neuroimaging

## 2022-01-28 ENCOUNTER — Other Ambulatory Visit: Payer: Self-pay | Admitting: Psychiatry

## 2022-01-28 DIAGNOSIS — G35 Multiple sclerosis: Secondary | ICD-10-CM

## 2022-01-28 MED ORDER — METHYLPHENIDATE HCL 20 MG PO TABS: 20.0000 mg | ORAL_TABLET | Freq: Every day | ORAL | 0 refills | Status: DC

## 2022-01-30 NOTE — Telephone Encounter (Signed)
Dr Billey Gosling sent refill on 01/28/22, receipt confirmed by [facility] pharmacy, patient advised via my chart this morning.

## 2022-02-02 ENCOUNTER — Ambulatory Visit (INDEPENDENT_AMBULATORY_CARE_PROVIDER_SITE_OTHER): Payer: Medicare PPO | Admitting: Neurology

## 2022-02-02 VITALS — BP 126/76 | HR 79 | Ht 62.0 in | Wt 174.0 lb

## 2022-02-02 DIAGNOSIS — G35 Multiple sclerosis: Secondary | ICD-10-CM | POA: Diagnosis not present

## 2022-02-02 DIAGNOSIS — F419 Anxiety disorder, unspecified: Secondary | ICD-10-CM | POA: Diagnosis not present

## 2022-02-02 DIAGNOSIS — R5382 Chronic fatigue, unspecified: Secondary | ICD-10-CM

## 2022-02-02 DIAGNOSIS — R269 Unspecified abnormalities of gait and mobility: Secondary | ICD-10-CM

## 2022-02-02 NOTE — Patient Instructions (Addendum)
Check labs today  We will continue the Ocrevus See you back in 6 months

## 2022-02-02 NOTE — Progress Notes (Signed)
Patient: Summer Henderson Date of Birth: 12-13-1960  Reason for Visit: Follow up History from: Patient Primary Neurologist: Summer Henderson   ASSESSMENT AND PLAN 61 y.o. year old female   1.  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  She does not want Pepcid -Check labs today, IgG, IgA, IgM -We will plan for MRI of the brain with and without contrast at next visit  2.  Fatigue -On Ritalin and Provigil -Check vitamin D - I do not wish to increase Ritalin any further, PCP can increase and manage if they are willing  3.  Depression -Zoloft was increased yesterday -Recommended she consider psychiatry  4.  Restless leg syndrome -Continue Mirapex and gabapentin  She will follow-up with me in 6 months or sooner if needed.  Dr. Marjory Henderson will be the primary neurologist. She has seen Dr. Epimenio Henderson in the past.  If she wishes to see another neurologist, we will need to consider referral to another neurology practice.   HISTORY OF PRESENT ILLNESS: Today 02/02/22 Summer Henderson is here today for follow-up.  Remains on Ocrevus. Last infusion was in August.  Gets home infusions.  Continues with fatigue, depression, everyday is hard.  Saw PCP yesterday, Zoloft was increased.  Remains on Ritalin, also Provigil to keep her awake.  She expressed frustration with her transition of care at our office, medication refilling.  His caregiver for her son, Summer Henderson. We had a long visit discussing her concerns about her transition of care. She was upset. She stopped smoking 4 months ago.   HISTORY UPDATE (08/01/21, VRP): Since last visit, here for transition of care. Dx'd with MS in 2005 (bladder sxs, fatigue). Then has had balance issue, blurred vision, confusion.    Since last visit, symptoms are stable.  Tolerating ocrevus. Tolerating pramipexole (for restless sensation in left arm / torso). Tolerating ritalin for fatigue. Had been on provigil in the past (last filled 2020).    PRIOR HPI (02/03/21, Willis): Summer Henderson is a 61 year old right-handed white female with a history of secondary progressive multiple sclerosis.  The patient is on Ocrevus and tolerates the medication well.  She has a chronic gait disorder, she has not had any falls since last seen.  She reports some issues with restless leg syndrome but her Mirapex is quite effective to treat this.  She began having blood in her urine this morning, she does have a history of frequent urinary tract infections.  She has no history of renal calculi.  She had a sinus infection in mid August and was treated with antibiotics for this.  She has had some decreased hearing in her right ear with fluid behind the ear and some worsening of her gait.  The patient normally uses a cane or a walker for ambulation.  She comes in today for further evaluation.   PRIOR HPI (07/08/20, Willis): Summer Henderson is a 61 year old right-handed white female with a history of multiple sclerosis.  The patient is on Ocrevus, she last got her dose in January 2022.  She tolerates the drug fairly well.  She has not noted any definite new symptoms associated with the multiple sclerosis.  She does report some numbness in the hands, she has chronic weakness of the proximal muscles of the left leg.  She reports gait instability, she has not had any recent  falls.  She has a neurogenic bladder that requires Botox injections, she has in and out catheterizations.  She is bothered significantly by a restless limb syndrome, her arms cause troubles at night, she has to move them vigorously to get rid of the sensation.  She is having trouble sleeping because of this.  She was recently taken off of Cymbalta and converted to Zoloft, the restless symptoms began shortly thereafter.  The patient has had ongoing  problems with depression, she is followed through psychiatry.  She returns to the office today for an evaluation.  REVIEW OF SYSTEMS: Out of a complete 14 system review of symptoms, the patient complains only of the following symptoms, and all other reviewed systems are negative.  See HPI  ALLERGIES: Allergies  Allergen Reactions   Demerol [Meperidine] Other (See Comments)    [derm] rash, swelling, itching Other reaction(s): Other (See Comments) [derm] rash, swelling, itching   Erythromycin    Morphine And Related    Sulfa Antibiotics    Diflucan [Fluconazole] Other (See Comments)    States she has prolonged QT and cannot take this.   Other     Other reaction(s): Other (See Comments) Z-pak causes EKG changes, was told by cardiologist not to take this.   Sulfacetamide Sodium    Erythromycin Base Rash    [Derm] rash   Fesoterodine Palpitations   Tape Other (See Comments)    Adhesive tape And Steri Strips; Local reaction-burns skin   Toviaz [Fesoterodine Fumarate Er] Palpitations    HOME MEDICATIONS: Outpatient Medications Prior to Visit  Medication Sig Dispense Refill   albuterol (VENTOLIN HFA) 108 (90 Base) MCG/ACT inhaler Inhale 2 puffs by mouth into the lungs every 6 (six) hours as needed for wheezing or shortness of breath. 18 g 0   amLODipine (NORVASC) 5 MG tablet Take 1 tablet (5 mg total) by mouth daily. 90 tablet 3   aspirin 81 MG chewable tablet Chew 1 tablet by mouth daily.     atorvastatin (LIPITOR) 80 MG tablet Take 1 tablet (80 mg total) by mouth daily. 90 tablet 3   Azelastine HCl 137 MCG/SPRAY SOLN Place 1 spray into both nostrils daily.     diphenhydrAMINE (BENADRYL) 50 MG capsule Take 1 capsule by mouth as needed.     esomeprazole (NEXIUM) 40 MG capsule Take 1 capsule (40 mg total) by mouth daily at 12 noon. 90 capsule 1   ferrous gluconate (FERGON) 324 MG tablet Take 1 tablet by mouth every other day.     furosemide (LASIX) 20 MG tablet TAKE 2 TABLETS (40 MG  TOTAL) BY MOUTH DAILY. 180 tablet 3   gabapentin (NEURONTIN) 100 MG capsule Take 1 capsule (100 mg total) by mouth at bedtime. 90 capsule 4   HYDROcodone-acetaminophen (NORCO/VICODIN) 5-325 MG tablet Take 1 tablet by mouth every 6 (six) hours as needed for moderate pain. 14 tablet 0   linaclotide (LINZESS) 145 MCG CAPS capsule Take 145 mcg by mouth daily before breakfast.     losartan (COZAAR) 100 MG tablet Take 1 tablet (100 mg total) by mouth daily. 90 tablet 3   methocarbamol (ROBAXIN) 500 MG tablet Take 500 mg by mouth every 8 (eight) hours as needed for muscle spasms.     methylphenidate (RITALIN) 20 MG tablet Take 1 tablet (20 mg total) by mouth daily. At noon 30 tablet 0   metoprolol succinate (TOPROL-XL) 50 MG 24 hr tablet TAKE 1 TABLET BY MOUTH EVERY DAY 90 tablet 3   mirabegron ER (  MYRBETRIQ) 50 MG TB24 tablet Take 1 tablet by mouth daily.     modafinil (PROVIGIL) 200 MG tablet Take 1 tablet (200 mg total) by mouth daily. 30 tablet 5   NASONEX 50 MCG/ACT nasal spray Place 50 sprays into the nose daily.     ocrelizumab 600 mg in sodium chloride 0.9 % 500 mL Inject 600 mg into the vein every 6 (six) months.     potassium chloride (KLOR-CON) 10 MEQ tablet Take 1 tablet (10 mEq total) by mouth daily. 90 tablet 3   pramipexole (MIRAPEX) 0.25 MG tablet Take 1 tablet (0.25 mg total) by mouth at bedtime. 30 tablet 12   sertraline (ZOLOFT) 50 MG tablet Take 1 tablet by mouth daily.     No facility-administered medications prior to visit.    PAST MEDICAL HISTORY: Past Medical History:  Diagnosis Date   Anxiety    Arthritis    CAD (coronary artery disease)    a. 06/2014 abnl MV;  b. 06/2014 Cath: LM nl, LAD 60m (2.75x28 Promus DES), LCX nl, RCA 60-15m (FFR = 0.80 -> 3.0x24 Promus DES), EF 55-65%.   Carpal tunnel syndrome    Cervical dysplasia    Chronic fatigue    Depression    GERD (gastroesophageal reflux disease)    Hypertension    Mitral valve prolapse    Multiple sclerosis (HCC)     Pneumonia    Prolonged QT interval    RLS (restless legs syndrome) 04/28/2015    PAST SURGICAL HISTORY: Past Surgical History:  Procedure Laterality Date   ABDOMINAL HYSTERECTOMY     BICEPS TENDON REPAIR     bladder resuspension     CARPAL TUNNEL RELEASE     CERVICAL SPINE SURGERY  10-16-2009   CORONARY ANGIOPLASTY WITH STENT PLACEMENT     DILATION AND CURETTAGE OF UTERUS     [facility] SURGERY Bilateral    For bunions   HAMMER TOE SURGERY Left    LEFT HEART CATHETERIZATION WITH CORONARY ANGIOGRAM N/A 06/29/2014   Procedure: LEFT HEART CATHETERIZATION WITH CORONARY ANGIOGRAM;  Surgeon: Micheline Chapman, MD;  Location[facility] C[facility] LAB;  Service: Cardiovascular;  Laterality: N/A;   ORIF TOE FRACTURE Right    Prolong QTC     Heart   rectovaginal fistula repair     ROTATOR CUFF REPAIR Left     FAMILY HISTORY: Family History  Problem Relation Age of Onset   Melanoma Mother    Colon polyps Mother    Diabetes Mother    Breast cancer Maternal Grandmother    Colon cancer Maternal Grandmother    Brain cancer Maternal Grandfather    Lung cancer Maternal Grandfather    Diabetes Maternal Grandfather    Kidney disease Neg Hx    Esophageal cancer Neg Hx    Gallbladder disease Neg Hx     SOCIAL HISTORY: Social History   Socioeconomic History   Marital status: Divorced    Spouse name: Not on file   Number of children: 2   Years of education: 13   Highest education level: Not on file  Occupational History   Occupation: Disabled    Comment: Disability  Tobacco Use   Smoking status: Every Day    Packs/day: 1.00    Years: 35.00    Total pack years: 35.00    Types: Cigarettes    Last attempt to quit: 06/30/2014    Years since quitting: 7.6   Smokeless tobacco: Never  Vaping Use   Vaping Use: Never used  Substance and Sexual Activity   Alcohol use: No    Alcohol/week: 0.0 standard drinks of alcohol   Drug use: No   Sexual activity: Not on file  Other Topics Concern   Not on  file  Social History Narrative   Patient lives with son   Patient does not drink caffeine.   Patient is right handed.   Social Determinants of Health   Financial Resource Strain: Not on file  Food Insecurity: Not on file  Transportation Needs: Not on file  Physical Activity: Not on file  Stress: Not on file  Social Connections: Not on file  Intimate Partner Violence: Not on file   PHYSICAL EXAM  Vitals:   02/02/22 1407  BP: 126/76  Pulse: 79  Weight: 174 lb (78.9 kg)  Height: 5\' 2"  (1.575 m)   Body mass index is 31.83 kg/m.  Generalized: Well developed, in no acute distress  Neurological examination  Mentation: Alert oriented to time, place, history taking. Follows all commands speech and language fluent Cranial nerve II-XII: Pupils were equal round reactive to light. Extraocular movements were full, visual field were full on confrontational test. Facial sensation and strength were normal. Head turning and shoulder shrug  were normal and symmetric. Motor: 3/5 left lower extremity, increased tone Sensory: Decreased soft touch to left leg Coordination: Cerebellar testing reveals good finger-nose-finger and heel-to-shin bilaterally.  Gait and station: Gait is is cautious, independent, pushes son in wheelchair Reflexes: Deep tendon reflexes are symmetric but increased to the left DIAGNOSTIC DATA (LABS, IMAGING, TESTING) - I reviewed patient records, labs, notes, testing and imaging myself where available.  Lab Results  Component Value Date   WBC 15.7 (H) 02/03/2021   HGB 15.0 02/03/2021   HCT 42.4 02/03/2021   MCV 92 02/03/2021   PLT 489 (H) 02/03/2021      Component Value Date/Time   NA 141 09/14/2021 1605   K 3.6 09/14/2021 1605   CL 102 09/14/2021 1605   CO2 21 09/14/2021 1605   GLUCOSE 136 (H) 09/14/2021 1605   GLUCOSE 97 11/09/2018 1457   BUN 6 09/14/2021 1605   CREATININE 0.82 09/14/2021 1605   CREATININE 0.71 01/11/2016 1502   CALCIUM 9.0 09/14/2021 1605    PROT 6.2 09/14/2021 1605   ALBUMIN 4.1 09/14/2021 1605   AST 25 09/14/2021 1605   ALT 25 09/14/2021 1605   ALKPHOS 156 (H) 09/14/2021 1605   BILITOT 0.4 09/14/2021 1605   GFRNONAA 85 03/24/2020 1526   GFRNONAA >89 06/17/2014 1111   GFRAA 98 03/24/2020 1526   GFRAA >89 06/17/2014 1111   Lab Results  Component Value Date   CHOL 129 09/14/2021   HDL 42 09/14/2021   LDLCALC 59 09/14/2021   TRIG 163 (H) 09/14/2021   CHOLHDL 3.1 09/14/2021   No results found for: "HGBA1C" No results found for: "VITAMINB12" No results found for: "TSH"  11/14/2021, AGNP-C, DNP 02/02/2022, 4:15 PM [facility] [address], [address] [place], [place] [phone]

## 2022-02-03 LAB — CBC WITH DIFFERENTIAL/PLATELET
Basophils Absolute: 0 10*3/uL (ref 0.0–0.2)
Basos: 0 %
EOS (ABSOLUTE): 0.1 10*3/uL (ref 0.0–0.4)
Eos: 1 %
Hematocrit: 44.7 % (ref 34.0–46.6)
Hemoglobin: 14.9 g/dL (ref 11.1–15.9)
Immature Grans (Abs): 0 10*3/uL (ref 0.0–0.1)
Immature Granulocytes: 0 %
Lymphocytes Absolute: 1.4 10*3/uL (ref 0.7–3.1)
Lymphs: 18 %
MCH: 30.9 pg (ref 26.6–33.0)
MCHC: 33.3 g/dL (ref 31.5–35.7)
MCV: 93 fL (ref 79–97)
Monocytes Absolute: 0.7 10*3/uL (ref 0.1–0.9)
Monocytes: 9 %
Neutrophils Absolute: 5.3 10*3/uL (ref 1.4–7.0)
Neutrophils: 72 %
Platelets: 371 10*3/uL (ref 150–450)
RBC: 4.82 x10E6/uL (ref 3.77–5.28)
RDW: 12.9 % (ref 11.7–15.4)
WBC: 7.5 10*3/uL (ref 3.4–10.8)

## 2022-02-03 LAB — IGG, IGA, IGM
IgA/Immunoglobulin A, Serum: 103 mg/dL (ref 87–352)
IgG (Immunoglobin G), Serum: 1032 mg/dL (ref 586–1602)
IgM (Immunoglobulin M), Srm: 29 mg/dL (ref 26–217)

## 2022-02-03 LAB — COMPREHENSIVE METABOLIC PANEL
ALT: 28 IU/L (ref 0–32)
AST: 25 IU/L (ref 0–40)
Albumin/Globulin Ratio: 2 (ref 1.2–2.2)
Albumin: 4.7 g/dL (ref 3.8–4.9)
Alkaline Phosphatase: 184 IU/L — ABNORMAL HIGH (ref 44–121)
BUN/Creatinine Ratio: 8 — ABNORMAL LOW (ref 9–23)
BUN: 7 mg/dL (ref 6–24)
Bilirubin Total: 0.5 mg/dL (ref 0.0–1.2)
CO2: 23 mmol/L (ref 20–29)
Calcium: 9.9 mg/dL (ref 8.7–10.2)
Chloride: 103 mmol/L (ref 96–106)
Creatinine, Ser: 0.87 mg/dL (ref 0.57–1.00)
Globulin, Total: 2.4 g/dL (ref 1.5–4.5)
Glucose: 87 mg/dL (ref 70–99)
Potassium: 3.7 mmol/L (ref 3.5–5.2)
Sodium: 142 mmol/L (ref 134–144)
Total Protein: 7.1 g/dL (ref 6.0–8.5)
eGFR: 77 mL/min/{1.73_m2} (ref >59)

## 2022-02-03 LAB — VITAMIN D 25 HYDROXY (VIT D DEFICIENCY, FRACTURES): Vit D, 25-Hydroxy: 14.5 ng/mL — ABNORMAL LOW (ref 30.0–100.0)

## 2022-02-22 NOTE — Progress Notes (Signed)
HPI: FU CAD, hypertension, prolonged QT and mitral valve prolapse. Holter monitor in September of 2012 showed sinus rhythm with rare PVC. Patient had an abdominal CT for abdominal pain in December 2015. 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 Nuclear study January 2016 showed an ejection fraction of 56%, probable mild breast attenuation with possible mild mid anteroseptal ischemia. Cardiac catheterization February 2016 showed 80% LAD and 60-70% RCA. Ejection fraction 55-65%. The patient had PCI of the LAD and RCA. Echocardiogram September 2020 showed normal LV function, grade 1 diastolic dysfunction.  Venous Dopplers September 2020 showed no DVT.  Chest CT February 2023 showed aortic atherosclerosis, coronary artery disease and emphysema.  Since she was last seen, she has some dyspnea on exertion but no orthopnea, PND, pedal edema, chest pain or syncope.  Current Outpatient Medications  Medication Sig Dispense Refill   albuterol (VENTOLIN HFA) 108 (90 Base) MCG/ACT inhaler Inhale 2 puffs by mouth into the lungs every 6 (six) hours as needed for wheezing or shortness of breath. 18 g 0   amLODipine (NORVASC) 5 MG tablet Take 1 tablet (5 mg total) by mouth daily. 90 tablet 3   aspirin 81 MG chewable tablet Chew 1 tablet by mouth daily.     atorvastatin (LIPITOR) 80 MG tablet Take 1 tablet (80 mg total) by mouth daily. 90 tablet 3   Azelastine HCl 137 MCG/SPRAY SOLN Place 1 spray into both nostrils daily.     diphenhydrAMINE (BENADRYL) 50 MG capsule Take 1 capsule by mouth as needed.     esomeprazole (NEXIUM) 40 MG capsule Take 1 capsule (40 mg total) by mouth daily at 12 noon. 90 capsule 1   ferrous gluconate (FERGON) 324 MG tablet Take 1 tablet by mouth every other day.     furosemide (LASIX) 20 MG tablet TAKE 2 TABLETS (40 MG TOTAL) BY MOUTH DAILY. 180 tablet 3   gabapentin (NEURONTIN) 100 MG capsule Take 1 capsule (100 mg total) by mouth at  bedtime. 90 capsule 4   HYDROcodone-acetaminophen (NORCO/VICODIN) 5-325 MG tablet Take 1 tablet by mouth every 6 (six) hours as needed for moderate pain. 14 tablet 0   linaclotide (LINZESS) 145 MCG CAPS capsule Take 145 mcg by mouth daily before breakfast.     losartan (COZAAR) 100 MG tablet Take 1 tablet (100 mg total) by mouth daily. 90 tablet 3   methocarbamol (ROBAXIN) 500 MG tablet Take 500 mg by mouth every 8 (eight) hours as needed for muscle spasms.     methylphenidate (RITALIN) 20 MG tablet Take 1 tablet (20 mg total) by mouth daily. At noon 30 tablet 0   methylPREDNISolone sodium succinate (SOLU-MEDROL) 125 mg/2 mL injection      metoprolol succinate (TOPROL-XL) 50 MG 24 hr tablet TAKE 1 TABLET BY MOUTH EVERY DAY 90 tablet 3   mirabegron ER (MYRBETRIQ) 50 MG TB24 tablet Take 1 tablet by mouth daily.     modafinil (PROVIGIL) 200 MG tablet Take 1 tablet (200 mg total) by mouth daily. 30 tablet 5   NASONEX 50 MCG/ACT nasal spray Place 50 sprays into the nose daily.     ocrelizumab 600 mg in sodium chloride 0.9 % 500 mL Inject 600 mg into the vein every 6 (six) months.     OCREVUS 300 MG/10ML injection Inject into the vein.     potassium chloride (KLOR-CON) 10 MEQ tablet Take 1 tablet (10 mEq total) by mouth daily. 90 tablet 3  pramipexole (MIRAPEX) 0.25 MG tablet Take 1 tablet (0.25 mg total) by mouth at bedtime. 30 tablet 12   sertraline (ZOLOFT) 50 MG tablet Take 1 tablet by mouth daily.     No current facility-administered medications for this visit.     Past Medical History:  Diagnosis Date   Anxiety    Arthritis    CAD (coronary artery disease)    a. 06/2014 abnl MV;  b. 06/2014 Cath: LM nl, LAD 58m (2.75x28 Promus DES), LCX nl, RCA 60-23m (FFR = 0.80 -> 3.0x24 Promus DES), EF 55-65%.   Carpal tunnel syndrome    Cervical dysplasia    Chronic fatigue    Depression    GERD (gastroesophageal reflux disease)    Hypertension    Mitral valve prolapse    Multiple sclerosis (HCC)     Pneumonia    Prolonged QT interval    RLS (restless legs syndrome) 04/28/2015    Past Surgical History:  Procedure Laterality Date   ABDOMINAL HYSTERECTOMY     BICEPS TENDON REPAIR     bladder resuspension     CARPAL TUNNEL RELEASE     CERVICAL SPINE SURGERY  10-16-2009   CORONARY ANGIOPLASTY WITH STENT PLACEMENT     DILATION AND CURETTAGE OF UTERUS     FOOT SURGERY Bilateral    For bunions   HAMMER TOE SURGERY Left    LEFT HEART CATHETERIZATION WITH CORONARY ANGIOGRAM N/A 06/29/2014   Procedure: LEFT HEART CATHETERIZATION WITH CORONARY ANGIOGRAM;  Surgeon: Blane Ohara, MD;  Location: [facility] CATH LAB;  Service: Cardiovascular;  Laterality: N/A;   ORIF TOE FRACTURE Right    Prolong QTC     Heart   rectovaginal fistula repair     ROTATOR CUFF REPAIR Left     Social History   Socioeconomic History   Marital status: Divorced    Spouse name: Not on file   Number of children: 2   Years of education: 10   Highest education level: Not on file  Occupational History   Occupation: Disabled    Comment: Disability  Tobacco Use   Smoking status: Every Day    Packs/day: 1.00    Years: 35.00    Total pack years: 35.00    Types: Cigarettes    Last attempt to quit: 06/30/2014    Years since quitting: 7.6   Smokeless tobacco: Never  Vaping Use   Vaping Use: Never used  Substance and Sexual Activity   Alcohol use: No    Alcohol/week: 0.0 standard drinks of alcohol   Drug use: No   Sexual activity: Not on file  Other Topics Concern   Not on file  Social History Narrative   Patient lives with son   Patient does not drink caffeine.   Patient is right handed.   Social Determinants of Health   Financial Resource Strain: Not on file  Food Insecurity: Not on file  Transportation Needs: Not on file  Physical Activity: Not on file  Stress: Not on file  Social Connections: Not on file  Intimate Partner Violence: Not on file    Family History  Problem Relation Age of Onset    Melanoma Mother    Colon polyps Mother    Diabetes Mother    Breast cancer Maternal Grandmother    Colon cancer Maternal Grandmother    Brain cancer Maternal Grandfather    Lung cancer Maternal Grandfather    Diabetes Maternal Grandfather    Kidney disease Neg Hx  Esophageal cancer Neg Hx    Gallbladder disease Neg Hx     ROS: no fevers or chills, productive cough, hemoptysis, dysphasia, odynophagia, melena, hematochezia, dysuria, hematuria, rash, seizure activity, orthopnea, PND, pedal edema, claudication. Remaining systems are negative.  Physical Exam: Well-developed well-nourished in no acute distress.  Skin is warm and dry.  HEENT is normal.  Neck is supple.  Chest is clear to auscultation with normal expansion.  Cardiovascular exam is regular rate and rhythm.  Abdominal exam nontender or distended. No masses palpated. Extremities show no edema. neuro grossly intact  ECG-sinus tachycardia at a rate of 103, nonspecific ST changes.  Low voltage.  Personally reviewed  A/P  1 coronary artery disease-patient denies chest pain.  Plan to continue medical therapy with aspirin and statin.  2 hypertension-patient's blood pressure is controlled today.  Continue present medical regimen.  3 hyperlipidemia-continue statin.  4 chronic diastolic congestive heart failure-patient is euvolemic on examination today.  Continue diuretic at present dose.  5 prolonged QT interval-patient does not have a history of syncope.  Avoid QT prolonging medications.  6 tobacco abuse-patient again counseled on discontinuing.  Note she did quit for 4 months recently but resumed.  I encouraged her to continue attempts.  Olga Millers, MD

## 2022-02-27 ENCOUNTER — Other Ambulatory Visit: Payer: Self-pay | Admitting: Neurology

## 2022-02-27 DIAGNOSIS — G35 Multiple sclerosis: Secondary | ICD-10-CM

## 2022-02-27 MED ORDER — METHYLPHENIDATE HCL 20 MG PO TABS: 20.0000 mg | ORAL_TABLET | Freq: Every day | ORAL | 0 refills | Status: DC

## 2022-02-27 NOTE — Telephone Encounter (Signed)
Verify Drug Registry For Methylphenidate 20 Mg Tablet Last Filled: 01/28/2022 Quantity: 30 tablets for 30 days Last appointment: 02/02/2022 Next appointment: 08/15/2022

## 2022-02-27 NOTE — Telephone Encounter (Signed)
Pt is requesting a refill for methylphenidate (RITALIN) 20 MG tablet.  Pharmacy: [facility] [phone]

## 2022-03-08 ENCOUNTER — Encounter: Payer: Self-pay | Admitting: Cardiology

## 2022-03-08 ENCOUNTER — Ambulatory Visit (INDEPENDENT_AMBULATORY_CARE_PROVIDER_SITE_OTHER): Payer: Medicare PPO | Admitting: Cardiology

## 2022-03-08 VITALS — BP 109/70 | HR 100 | Ht 62.0 in | Wt 179.1 lb

## 2022-03-08 DIAGNOSIS — I10 Essential (primary) hypertension: Secondary | ICD-10-CM | POA: Diagnosis not present

## 2022-03-08 DIAGNOSIS — I2510 Atherosclerotic heart disease of native coronary artery without angina pectoris: Secondary | ICD-10-CM | POA: Diagnosis not present

## 2022-03-08 DIAGNOSIS — E785 Hyperlipidemia, unspecified: Secondary | ICD-10-CM

## 2022-03-08 NOTE — Patient Instructions (Signed)
  Follow-Up: At [facility], you and your health needs are our priority.  As part of our continuing mission to provide you with exceptional heart care, we have created designated Provider Care Teams.  These Care Teams include your primary Cardiologist (physician) and Advanced Practice Providers (APPs -  Physician Assistants and Nurse Practitioners) who all work together to provide you with the care you need, when you need it.  We recommend signing up for the patient portal called "MyChart".  Sign up information is provided on this After Visit Summary.  MyChart is used to connect with patients for Virtual Visits (Telemedicine).  Patients are able to view lab/test results, encounter notes, upcoming appointments, etc.  Non-urgent messages can be sent to your provider as well.   To learn more about what you can do with MyChart, go to [email or web address].    Your next appointment:   12 month(s)  The format for your next appointment:   In Person  Provider:   Brian Crenshaw, MD   

## 2022-03-29 ENCOUNTER — Encounter: Payer: Self-pay | Admitting: Diagnostic Neuroimaging

## 2022-03-29 DIAGNOSIS — G35 Multiple sclerosis: Secondary | ICD-10-CM

## 2022-03-29 MED ORDER — METHYLPHENIDATE HCL 20 MG PO TABS: 20.0000 mg | ORAL_TABLET | Freq: Every day | ORAL | 0 refills | Status: DC

## 2022-03-31 ENCOUNTER — Encounter: Payer: Self-pay | Admitting: Diagnostic Neuroimaging

## 2022-03-31 DIAGNOSIS — G35 Multiple sclerosis: Secondary | ICD-10-CM

## 2022-03-31 MED ORDER — METHYLPHENIDATE HCL 20 MG PO TABS: 20.0000 mg | ORAL_TABLET | Freq: Every day | ORAL | 0 refills | Status: DC

## 2022-03-31 NOTE — Telephone Encounter (Signed)
Patient requested via after-hours call message that Ritalin prescription be resent to her local retail pharmacy.  It was sent to her mail order pharmacy.  I resent Ritalin prescription to [facility] pharmacy in [facility] as per patient request.

## 2022-04-03 ENCOUNTER — Other Ambulatory Visit: Payer: Self-pay | Admitting: Neurology

## 2022-05-02 ENCOUNTER — Telehealth: Payer: Self-pay | Admitting: Neurology

## 2022-05-02 DIAGNOSIS — G35 Multiple sclerosis: Secondary | ICD-10-CM

## 2022-05-02 MED ORDER — MODAFINIL 200 MG PO TABS: 200.0000 mg | ORAL_TABLET | Freq: Every day | ORAL | 5 refills | Status: DC

## 2022-05-02 NOTE — Telephone Encounter (Signed)
Pt request refill for  methylphenidate (RITALIN) 20 MG tablet at [facility] [phone]

## 2022-05-03 MED ORDER — METHYLPHENIDATE HCL 20 MG PO TABS: 20.0000 mg | ORAL_TABLET | Freq: Every day | ORAL | 0 refills | Status: DC

## 2022-05-03 NOTE — Telephone Encounter (Signed)
Notified pt refill has been sent.

## 2022-05-03 NOTE — Telephone Encounter (Signed)
Meds ordered this encounter  Medications   modafinil (PROVIGIL) 200 MG tablet    Sig: Take 1 tablet (200 mg total) by mouth daily.    Dispense:  30 tablet    Refill:  5   methylphenidate (RITALIN) 20 MG tablet    Sig: Take 1 tablet (20 mg total) by mouth daily. At noon    Dispense:  30 tablet    Refill:  0

## 2022-05-03 NOTE — Telephone Encounter (Signed)
Pt is calling again. Asking when medication  methylphenidate (RITALIN) 20 MG tablet will be sent to the pharmacy. Stated she is out of medication and needs it sent today.

## 2022-05-04 ENCOUNTER — Other Ambulatory Visit: Payer: Self-pay | Admitting: Diagnostic Neuroimaging

## 2022-05-04 DIAGNOSIS — G35 Multiple sclerosis: Secondary | ICD-10-CM

## 2022-05-04 MED ORDER — METHYLPHENIDATE HCL 20 MG PO TABS: 20.0000 mg | ORAL_TABLET | Freq: Every day | ORAL | 0 refills | Status: DC

## 2022-05-04 NOTE — Telephone Encounter (Signed)
Pt has called to report that she checked with the pharmacy at 12 and it is not there.  Pt asked it be noted that she has been without the medication since Saturday.

## 2022-05-04 NOTE — Telephone Encounter (Signed)
Pt has called again to see if her medication has been called in. Pt asked that it also be noted that she has put a call into Patient relations because she does not feel this is right.

## 2022-05-04 NOTE — Telephone Encounter (Signed)
I have routed this request to Dr Sethi for review. The pt is due for the medication and [place] registry was verified.    

## 2022-05-04 NOTE — Telephone Encounter (Signed)
I have routed the script to Dr Pearlean Brownie. He is the only provider in the office today and he has also been seeing pt's. As soon as the script has been sent, someone will notify the pt

## 2022-05-04 NOTE — Telephone Encounter (Signed)
Pt called stating that her prescription once again was sent to the wrong pharmacy. Pt is needing her methylphenidate (RITALIN) 20 MG tablet sent in to [facility] pharmacy NOT [facility]. Pt states she goes through this every month.

## 2022-05-10 ENCOUNTER — Telehealth: Payer: Self-pay | Admitting: Neurology

## 2022-05-10 NOTE — Telephone Encounter (Signed)
Noted, I have submitted the PA to cmm (Key: [id number])  Your information has been sent to [facility].

## 2022-05-10 NOTE — Telephone Encounter (Signed)
DeAnn with [facility] has called to inform that a fax is being sent to the office re: pt's home infusions thru Cover My Meds, she is calling to provide the needed Key for Cover My Meds so updates can be provided.  The Key is: [id number]  TDVV#[id number] DeAnn's call back is [phone]

## 2022-05-11 NOTE — Telephone Encounter (Signed)
Approved through [facility] for the patient until 05/08/2023

## 2022-05-15 NOTE — Telephone Encounter (Signed)
Noted, thank you

## 2022-06-05 ENCOUNTER — Encounter: Payer: Self-pay | Admitting: Neurology

## 2022-06-05 DIAGNOSIS — G35 Multiple sclerosis: Secondary | ICD-10-CM

## 2022-06-05 MED ORDER — METHYLPHENIDATE HCL 20 MG PO TABS: 20.0000 mg | ORAL_TABLET | Freq: Every day | ORAL | 0 refills | Status: DC

## 2022-06-05 NOTE — Telephone Encounter (Signed)
  Patient is due, please refill if agreeable 

## 2022-06-13 ENCOUNTER — Telehealth: Payer: Self-pay | Admitting: Neurology

## 2022-06-13 NOTE — Telephone Encounter (Signed)
[facility] (Cassie) Pt received Covid booster January 30th confirm need to wait 4 weeks for pt's infusion. Would like a call back

## 2022-06-13 NOTE — Telephone Encounter (Signed)
Pt called stating that she was to get her infusion in the next couple of days but due to getting sick the infusion company informed her that she has to be symptom free in order to get the infusion done. Pt would like to know if this is going to affect anything if she would get her infusion a couple of weeks later than usual. Please advise.

## 2022-06-14 NOTE — Telephone Encounter (Signed)
I called [facility] back and spoke with whitney relayed feed back from NP Suzzanne Cloud, NP  Gna[facility] 2 Calls10 hours ago (9:26 PM)    2-4 weeks after COVID booster is reasonable time before Ocrevus infusion.  She verbalized understanding and appreciation for the call.

## 2022-07-10 ENCOUNTER — Telehealth: Payer: Self-pay | Admitting: Neurology

## 2022-07-10 ENCOUNTER — Encounter: Payer: Self-pay | Admitting: Neurology

## 2022-07-10 DIAGNOSIS — G35 Multiple sclerosis: Secondary | ICD-10-CM

## 2022-07-10 MED ORDER — METHYLPHENIDATE HCL 20 MG PO TABS: 20.0000 mg | ORAL_TABLET | Freq: Every day | ORAL | 0 refills | Status: DC

## 2022-07-10 NOTE — Telephone Encounter (Signed)
[facility] is asking to be called back re; the scheduling of pt's missed infusion

## 2022-07-11 NOTE — Telephone Encounter (Signed)
Pt has been added to the wait list.

## 2022-07-12 ENCOUNTER — Encounter: Payer: Self-pay | Admitting: Neurology

## 2022-07-12 NOTE — Telephone Encounter (Signed)
I called the patient, she was confused about getting fast pass notification to accept appointment tomorrow, she is still coming for routine follow up. Scheduled for home health Ocrevus on Friday. She is recovered from sinus and covid issues.

## 2022-07-12 NOTE — Progress Notes (Unsigned)
Patient: Summer Henderson Date of Birth: March 17, 1961  Reason for Visit: Follow up History from: Patient Primary Neurologist: Willis/Penumalli   ASSESSMENT AND PLAN 62 y.o. year old female   Summer Henderson is overall stable in regards to her MS.  Her main issue is chronic fatigue as we have discussed in the last 2 visits.  1.  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  She does not want Pepcid) -Check labs today, IgG, IgA, IgM, CBC, CMP -MRI of the brain with and without contrast, she prefers to have done in [facility] [place]  2.  Fatigue -Main issue we have discussed at last 2 visits -On vitamin D -Will discontinue Provigil, has not taken in months, currently on Ritalin 20 mg  AM, consider switching to Ritalin XR 20 mg daily vs 20 mg AM/5 mg midday, she wants to talk with pharmacy -Referral for sleep consult to rule out obstructive sleep apnea given snoring, reported apnea spells, chronic fatigue, daytime somnolence, ESS 11  3.  Depression -Sees psychologist, on Zoloft  4.  Restless leg syndrome -Continue Mirapex and gabapentin  HISTORY OF PRESENT ILLNESS: Today 07/13/22 Scheduled for Ocrevus tomorrow via home health. They had COVID, sinus issues few weeks ago. Ocrevus had to be delayed. Her legs feel heavy, lifting cement blocks.  Has not taken Provigil in months, last refilled in August 2023. Takes Ritalin 20 mg in AM. Takes Mirapex, more for left sided crawling sensation, resolved with Mirapex. Vitamin D level was 36, on OTC supplement. TSH 1.25. A1C 6.1.  Overall issues chronic fatigue, mentions repeatedly, she limits her daily activities, hard to care for her handicapped son.  Fatigue is main issue.  I asked about sleep apnea, she does snore, has woke up gasping, as mentioned  significant daytime fatigue, somnolence, ESS 11. This appointment was at 845 she had to get up at 4 AM to get herself ready.  Update 02/02/22 SS: Summer Henderson is here today for follow-up.  Remains on Ocrevus. Last infusion was in August.  Gets home infusions.  Continues with fatigue, depression, everyday is hard.  Saw PCP yesterday, Zoloft was increased.  Remains on Ritalin, also Provigil to keep her awake.  She expressed frustration with her transition of care at our office, medication refilling.  His caregiver for her son, Corene Cornea. We had a long visit discussing her concerns about her transition of care. She was upset. She stopped smoking 4 months ago.   HISTORY UPDATE (08/01/21, VRP): Since last visit, here for transition of care. Dx'd with MS in 2005 (bladder sxs, fatigue). Then has had balance issue, blurred vision, confusion.    Since last visit, symptoms are stable. Tolerating ocrevus. Tolerating pramipexole (for restless sensation in left arm / torso). Tolerating ritalin for fatigue. Had been on provigil in the past (last filled 2020).    PRIOR HPI (02/03/21, Willis): Summer Henderson is a 62 year old right-handed white female with a history of secondary progressive multiple sclerosis.  The patient is on Ocrevus and tolerates the medication well.  She has a chronic gait disorder, she has not had any falls since last seen.  She reports some issues with restless leg syndrome but her Mirapex is quite effective to treat this.  She began having blood in her urine this morning, she does have a history of frequent urinary  tract infections.  She has no history of renal calculi.  She had a sinus infection in mid August and was treated with antibiotics for this.  She has had some decreased hearing in her right ear with fluid behind the ear and some worsening of her gait.  The patient normally uses a cane or a walker for ambulation.  She comes in today for further evaluation.   PRIOR HPI (07/08/20, Willis): Summer Henderson is a  62 year old right-handed white female with a history of multiple sclerosis.  The patient is on Ocrevus, she last got her dose in January 2022.  She tolerates the drug fairly well.  She has not noted any definite new symptoms associated with the multiple sclerosis.  She does report some numbness in the hands, she has chronic weakness of the proximal muscles of the left leg.  She reports gait instability, she has not had any recent falls.  She has a neurogenic bladder that requires Botox injections, she has in and out catheterizations.  She is bothered significantly by a restless limb syndrome, her arms cause troubles at night, she has to move them vigorously to get rid of the sensation.  She is having trouble sleeping because of this.  She was recently taken off of Cymbalta and converted to Zoloft, the restless symptoms began shortly thereafter.  The patient has had ongoing problems with depression, she is followed through psychiatry.  She returns to the office today for an evaluation.  REVIEW OF SYSTEMS: Out of a complete 14 system review of symptoms, the patient complains only of the following symptoms, and all other reviewed systems are negative.  See HPI  ALLERGIES: Allergies  Allergen Reactions   Demerol [Meperidine] Other (See Comments)    [derm] rash, swelling, itching Other reaction(s): Other (See Comments) [derm] rash, swelling, itching   Erythromycin    Morphine And Related    Sulfa Antibiotics    Diflucan [Fluconazole] Other (See Comments)    States she has prolonged QT and cannot take this.   Other     Other reaction(s): Other (See Comments) Z-pak causes EKG changes, was told by cardiologist not to take this.   Sulfacetamide Sodium    Erythromycin Base Rash    [Derm] rash   Fesoterodine Palpitations   Tape Other (See Comments)    Adhesive tape And Steri Strips; Local reaction-burns skin   Toviaz [Fesoterodine Fumarate Er] Palpitations    HOME MEDICATIONS: Outpatient  Medications Prior to Visit  Medication Sig Dispense Refill   albuterol (VENTOLIN HFA) 108 (90 Base) MCG/ACT inhaler Inhale 2 puffs by mouth into the lungs every 6 (six) hours as needed for wheezing or shortness of breath. 18 g 0   amLODipine (NORVASC) 5 MG tablet Take 1 tablet (5 mg total) by mouth daily. 90 tablet 3   aspirin 81 MG chewable tablet Chew 1 tablet by mouth daily.     atorvastatin (LIPITOR) 80 MG tablet Take 1 tablet (80 mg total) by mouth daily. 90 tablet 3   Azelastine HCl 137 MCG/SPRAY SOLN Place 1 spray into both nostrils daily.     diphenhydrAMINE (BENADRYL) 50 MG capsule Take 1 capsule by mouth as needed.     esomeprazole (NEXIUM) 40 MG capsule Take 1 capsule (40 mg total) by mouth daily at 12 noon. 90 capsule 1   ferrous gluconate (FERGON) 324 MG tablet Take 1 tablet by mouth every other day.     furosemide (LASIX) 20 MG tablet TAKE 2 TABLETS (40 MG TOTAL)  BY MOUTH DAILY. 180 tablet 3   gabapentin (NEURONTIN) 100 MG capsule Take 1 capsule (100 mg total) by mouth at bedtime. 90 capsule 4   HYDROcodone-acetaminophen (NORCO/VICODIN) 5-325 MG tablet Take 1 tablet by mouth every 6 (six) hours as needed for moderate pain. 14 tablet 0   linaclotide (LINZESS) 145 MCG CAPS capsule Take 145 mcg by mouth daily before breakfast.     losartan (COZAAR) 100 MG tablet Take 1 tablet (100 mg total) by mouth daily. 90 tablet 3   methocarbamol (ROBAXIN) 500 MG tablet Take 500 mg by mouth every 8 (eight) hours as needed for muscle spasms.     methylphenidate (RITALIN) 20 MG tablet Take 1 tablet (20 mg total) by mouth daily. At noon 30 tablet 0   methylPREDNISolone sodium succinate (SOLU-MEDROL) 125 mg/2 mL injection      metoprolol succinate (TOPROL-XL) 50 MG 24 hr tablet TAKE 1 TABLET BY MOUTH EVERY DAY 90 tablet 3   mirabegron ER (MYRBETRIQ) 50 MG TB24 tablet Take 1 tablet by mouth daily.     modafinil (PROVIGIL) 200 MG tablet Take 1 tablet (200 mg total) by mouth daily. 30 tablet 5   NASONEX  50 MCG/ACT nasal spray Place 50 sprays into the nose daily.     ocrelizumab 600 mg in sodium chloride 0.9 % 500 mL Inject 600 mg into the vein every 6 (six) months.     OCREVUS 300 MG/10ML injection Inject into the vein.     potassium chloride (KLOR-CON) 10 MEQ tablet Take 1 tablet (10 mEq total) by mouth daily. 90 tablet 3   pramipexole (MIRAPEX) 0.25 MG tablet Take 1 tablet (0.25 mg total) by mouth at bedtime. 30 tablet 12   sertraline (ZOLOFT) 50 MG tablet Take 1 tablet by mouth daily.     No facility-administered medications prior to visit.    PAST MEDICAL HISTORY: Past Medical History:  Diagnosis Date   Anxiety    Arthritis    CAD (coronary artery disease)    a. 06/2014 abnl MV;  b. 06/2014 Cath: LM nl, LAD 34m(2.75x28 Promus DES), LCX nl, RCA 60-766mFFR = 0.80 -> 3.0x24 Promus DES), EF 55-65%.   Carpal tunnel syndrome    Cervical dysplasia    Chronic fatigue    Depression    GERD (gastroesophageal reflux disease)    Hypertension    Mitral valve prolapse    Multiple sclerosis (HCC)    Pneumonia    Prolonged QT interval    RLS (restless legs syndrome) 04/28/2015    PAST SURGICAL HISTORY: Past Surgical History:  Procedure Laterality Date   ABDOMINAL HYSTERECTOMY     BICEPS TENDON REPAIR     bladder resuspension     CARPAL TUNNEL RELEASE     CERVICAL SPINE SURGERY  10-16-2009   CORONARY ANGIOPLASTY WITH STENT PLACEMENT     DILATION AND CURETTAGE OF UTERUS     FOOT SURGERY Bilateral    For bunions   HAMMER TOE SURGERY Left    LEFT HEART CATHETERIZATION WITH CORONARY ANGIOGRAM N/A 06/29/2014   Procedure: LEFT HEART CATHETERIZATION WITH CORONARY ANGIOGRAM;  Surgeon: MiBlane OharaMD;  [facility];  Service: Cardiovascular;  Laterality: N/A;   ORIF TOE FRACTURE Right    Prolong QTC     Heart   rectovaginal fistula repair     ROTATOR CUFF REPAIR Left     FAMILY HISTORY: Family History  Problem Relation Age of Onset   Melanoma Mother  Colon polyps  Mother    Diabetes Mother    Breast cancer Maternal Grandmother    Colon cancer Maternal Grandmother    Brain cancer Maternal Grandfather    Lung cancer Maternal Grandfather    Diabetes Maternal Grandfather    Kidney disease Neg Hx    Esophageal cancer Neg Hx    Gallbladder disease Neg Hx     SOCIAL HISTORY: Social History   Socioeconomic History   Marital status: Divorced    Spouse name: Not on file   Number of children: 2   Years of education: 65   Highest education level: Not on file  Occupational History   Occupation: Disabled    Comment: Disability  Tobacco Use   Smoking status: Every Day    Packs/day: 1.00    Years: 35.00    Total pack years: 35.00    Types: Cigarettes    Last attempt to quit: 06/30/2014    Years since quitting: 8.0   Smokeless tobacco: Never  Vaping Use   Vaping Use: Never used  Substance and Sexual Activity   Alcohol use: No    Alcohol/week: 0.0 standard drinks of alcohol   Drug use: No   Sexual activity: Not on file  Other Topics Concern   Not on file  Social History Narrative   Patient lives with son   Patient does not drink caffeine.   Patient is right handed.   Social Determinants of Health   Financial Resource Strain: Not on file  Food Insecurity: Not on file  Transportation Needs: Not on file  Physical Activity: Not on file  Stress: Not on file  Social Connections: Not on file  Intimate Partner Violence: Not on file   PHYSICAL EXAM  Vitals:   07/13/22 0852  BP: (!) 140/81  Pulse: 85  Weight: 179 lb (81.2 kg)  Height: '5\' 2"'$  (1.575 m)    Body mass index is 32.74 kg/m.  Generalized: Well developed, in no acute distress  Neurological examination  Mentation: Alert oriented to time, place, history taking. Follows all commands speech and language fluent Cranial nerve II-XII: Pupils were equal round reactive to light. Extraocular movements were full, visual field were full on confrontational test. Facial sensation and  strength were normal. Head turning and shoulder shrug  were normal and symmetric. Motor: 3/5 left lower extremity, increased tone Sensory: Decreased soft touch to left leg Coordination: Cerebellar testing reveals good finger-nose-finger and heel-to-shin bilaterally.  Gait and station: Gait is is cautious, independent, pushes son in wheelchair Reflexes: Deep tendon reflexes are symmetric but increased to the left DIAGNOSTIC DATA (LABS, IMAGING, TESTING) - I reviewed patient records, labs, notes, testing and imaging myself where available.  Lab Results  Component Value Date   WBC 7.5 02/02/2022   HGB 14.9 02/02/2022   HCT 44.7 02/02/2022   MCV 93 02/02/2022   PLT 371 02/02/2022      Component Value Date/Time   NA 142 02/02/2022 1506   K 3.7 02/02/2022 1506   CL 103 02/02/2022 1506   CO2 23 02/02/2022 1506   GLUCOSE 87 02/02/2022 1506   GLUCOSE 97 11/09/2018 1457   BUN 7 02/02/2022 1506   CREATININE 0.87 02/02/2022 1506   CREATININE 0.71 01/11/2016 1502   CALCIUM 9.9 02/02/2022 1506   PROT 7.1 02/02/2022 1506   ALBUMIN 4.7 02/02/2022 1506   AST 25 02/02/2022 1506   ALT 28 02/02/2022 1506   ALKPHOS 184 (H) 02/02/2022 1506   BILITOT 0.5 02/02/2022 1506  GFRNONAA 85 03/24/2020 1526   GFRNONAA >89 06/17/2014 1111   GFRAA 98 03/24/2020 1526   GFRAA >89 06/17/2014 1111   Lab Results  Component Value Date   CHOL 129 09/14/2021   HDL 42 09/14/2021   LDLCALC 59 09/14/2021   TRIG 163 (H) 09/14/2021   CHOLHDL 3.1 09/14/2021   No results found for: "HGBA1C" No results found for: "VITAMINB12" No results found for: "TSH"  Butler Denmark, AGNP-C, DNP 07/13/2022, 8:58 AM [facility] [address] [place], [place] [address] [phone]

## 2022-07-13 ENCOUNTER — Ambulatory Visit (INDEPENDENT_AMBULATORY_CARE_PROVIDER_SITE_OTHER): Payer: Medicare PPO | Admitting: Neurology

## 2022-07-13 VITALS — BP 140/81 | HR 85 | Ht 62.0 in | Wt 179.0 lb

## 2022-07-13 DIAGNOSIS — G35 Multiple sclerosis: Secondary | ICD-10-CM | POA: Diagnosis not present

## 2022-07-13 DIAGNOSIS — F419 Anxiety disorder, unspecified: Secondary | ICD-10-CM

## 2022-07-13 DIAGNOSIS — R5382 Chronic fatigue, unspecified: Secondary | ICD-10-CM | POA: Diagnosis not present

## 2022-07-13 DIAGNOSIS — G2581 Restless legs syndrome: Secondary | ICD-10-CM | POA: Diagnosis not present

## 2022-07-13 MED ORDER — PRAMIPEXOLE DIHYDROCHLORIDE 0.25 MG PO TABS: 0.2500 mg | ORAL_TABLET | Freq: Every day | ORAL | 12 refills | Status: DC

## 2022-07-13 MED ORDER — GABAPENTIN 100 MG PO CAPS: 100.0000 mg | ORAL_CAPSULE | Freq: Every day | ORAL | 1 refills | Status: DC

## 2022-07-13 NOTE — Patient Instructions (Addendum)
Continue the Ocrevus Check labs  Check MRI brain  Referral for sleep study See you back in 6 months

## 2022-07-14 LAB — CBC WITH DIFFERENTIAL/PLATELET
Basophils Absolute: 0.1 10*3/uL (ref 0.0–0.2)
Basos: 0 %
EOS (ABSOLUTE): 0.2 10*3/uL (ref 0.0–0.4)
Eos: 2 %
Hematocrit: 46.5 % (ref 34.0–46.6)
Hemoglobin: 15.3 g/dL (ref 11.1–15.9)
Immature Grans (Abs): 0 10*3/uL (ref 0.0–0.1)
Immature Granulocytes: 0 %
Lymphocytes Absolute: 2.4 10*3/uL (ref 0.7–3.1)
Lymphs: 21 %
MCH: 30.9 pg (ref 26.6–33.0)
MCHC: 32.9 g/dL (ref 31.5–35.7)
MCV: 94 fL (ref 79–97)
Monocytes Absolute: 0.6 10*3/uL (ref 0.1–0.9)
Monocytes: 5 %
Neutrophils Absolute: 8.4 10*3/uL — ABNORMAL HIGH (ref 1.4–7.0)
Neutrophils: 72 %
Platelets: 436 10*3/uL (ref 150–450)
RBC: 4.95 x10E6/uL (ref 3.77–5.28)
RDW: 13.2 % (ref 11.7–15.4)
WBC: 11.8 10*3/uL — ABNORMAL HIGH (ref 3.4–10.8)

## 2022-07-14 LAB — COMPREHENSIVE METABOLIC PANEL
ALT: 30 IU/L (ref 0–32)
AST: 24 IU/L (ref 0–40)
Albumin/Globulin Ratio: 2.5 — ABNORMAL HIGH (ref 1.2–2.2)
Albumin: 4.9 g/dL (ref 3.8–4.9)
Alkaline Phosphatase: 200 IU/L — ABNORMAL HIGH (ref 44–121)
BUN/Creatinine Ratio: 7 — ABNORMAL LOW (ref 12–28)
BUN: 6 mg/dL — ABNORMAL LOW (ref 8–27)
Bilirubin Total: 0.4 mg/dL (ref 0.0–1.2)
CO2: 21 mmol/L (ref 20–29)
Calcium: 9.5 mg/dL (ref 8.7–10.3)
Chloride: 104 mmol/L (ref 96–106)
Creatinine, Ser: 0.85 mg/dL (ref 0.57–1.00)
Globulin, Total: 2 g/dL (ref 1.5–4.5)
Glucose: 86 mg/dL (ref 70–99)
Potassium: 4.7 mmol/L (ref 3.5–5.2)
Sodium: 144 mmol/L (ref 134–144)
Total Protein: 6.9 g/dL (ref 6.0–8.5)
eGFR: 78 mL/min/{1.73_m2} (ref >59)

## 2022-07-14 LAB — IGG, IGA, IGM
IgA/Immunoglobulin A, Serum: 94 mg/dL (ref 87–352)
IgG (Immunoglobin G), Serum: 983 mg/dL (ref 586–1602)
IgM (Immunoglobulin M), Srm: 28 mg/dL (ref 26–217)

## 2022-07-20 ENCOUNTER — Telehealth: Payer: Self-pay | Admitting: Neurology

## 2022-07-20 ENCOUNTER — Telehealth: Payer: Self-pay

## 2022-07-20 NOTE — Telephone Encounter (Signed)
Sent mychart in attempted to share results

## 2022-07-20 NOTE — Telephone Encounter (Signed)
Mcarthur Rossetti Josem Kaufmann: [id number] exp. 07/20/22-08/19/22, [place] medicaid NPR sent to [facility] [phone]

## 2022-07-24 ENCOUNTER — Encounter: Payer: Self-pay | Admitting: Neurology

## 2022-07-24 NOTE — Telephone Encounter (Signed)
Mcarthur Rossetti Josem Kaufmann: [id number] exp. 07/24/22-08/23/22, [place] medicaid NPR sent to [facility] per patient [phone]

## 2022-07-31 ENCOUNTER — Other Ambulatory Visit: Payer: Self-pay

## 2022-07-31 MED ORDER — ALPRAZOLAM 0.5 MG PO TABS: 0.5000 mg | ORAL_TABLET | Freq: Once | ORAL | 0 refills | Status: DC | PRN

## 2022-07-31 NOTE — Telephone Encounter (Signed)
Alprazolam has been prescribed in the past when getting MRI. Will send a script for Judson Roch to send in for the patient for the upcoming MRI. Pt must have a driver

## 2022-07-31 NOTE — Addendum Note (Signed)
Addended by: Darleen Crocker on: 07/31/2022 10:23 AM   Modules accepted: Orders

## 2022-07-31 NOTE — Telephone Encounter (Signed)
Called to schedule Slp Consult for pt and she requested I send Sarah's pod a msg that she has an MRI scheduled and needs medication to take prior due to her claustrophobia. Please call pt when you can to discuss this. Thanks!

## 2022-08-01 ENCOUNTER — Other Ambulatory Visit: Payer: Self-pay | Admitting: *Deleted

## 2022-08-01 MED ORDER — ESOMEPRAZOLE MAGNESIUM 40 MG PO CPDR: 40.0000 mg | DELAYED_RELEASE_CAPSULE | Freq: Every day | ORAL | 1 refills | Status: DC

## 2022-08-09 ENCOUNTER — Encounter: Payer: Self-pay | Admitting: Neurology

## 2022-08-09 ENCOUNTER — Ambulatory Visit (HOSPITAL_BASED_OUTPATIENT_CLINIC_OR_DEPARTMENT_OTHER)
Admission: RE | Admit: 2022-08-09 | Discharge: 2022-08-09 | Disposition: A | Discharge status: Home / Self Care | Payer: Medicare PPO | Source: Ambulatory Visit | Attending: Neurology | Admitting: Neurology

## 2022-08-09 DIAGNOSIS — G35 Multiple sclerosis: Secondary | ICD-10-CM | POA: Diagnosis present

## 2022-08-09 MED ORDER — GADOBUTROL 1 MMOL/ML IV SOLN
8.0000 mL | Freq: Once | INTRAVENOUS | Status: AC | PRN
  Administered 2022-08-09: 8 mL via INTRAVENOUS

## 2022-08-09 MED ORDER — METHYLPHENIDATE HCL 20 MG PO TABS: 20.0000 mg | ORAL_TABLET | Freq: Every day | ORAL | 0 refills | Status: DC

## 2022-08-11 NOTE — Progress Notes (Signed)
HPI: FU CAD, hypertension, prolonged QT and mitral valve prolapse. Holter monitor in September of 2012 showed sinus rhythm with rare PVC. Patient had an abdominal CT for abdominal pain in December 2015. 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 Nuclear study January 2016 showed an ejection fraction of 56%, probable mild breast attenuation with possible mild mid anteroseptal ischemia. Cardiac catheterization February 2016 showed 80% LAD and 60-70% RCA. Ejection fraction 55-65%. The patient had PCI of the LAD and RCA. Echocardiogram September 2020 showed normal LV function, grade 1 diastolic dysfunction.  Venous Dopplers September 2020 showed no DVT.  Chest CT February 2023 showed aortic atherosclerosis, coronary artery disease and emphysema.  Since she was last seen, patient denies dyspnea, chest pain, palpitations or syncope.  Current Outpatient Medications  Medication Sig Dispense Refill   albuterol (VENTOLIN HFA) 108 (90 Base) MCG/ACT inhaler Inhale 2 puffs by mouth into the lungs every 6 (six) hours as needed for wheezing or shortness of breath. 18 g 0   ALPRAZolam (XANAX) 0.5 MG tablet Take 1 tablet (0.5 mg total) by mouth once as needed for up to 1 dose for anxiety (Take 1 tablet 30-45 min prior to MRI. may repeat additional tablet at the time of the MRI if needed (must have a driver to/from MRI)). 2 tablet 0   amLODipine (NORVASC) 5 MG tablet Take 1 tablet (5 mg total) by mouth daily. 90 tablet 3   aspirin 81 MG chewable tablet Chew 1 tablet by mouth daily.     atorvastatin (LIPITOR) 80 MG tablet Take 1 tablet (80 mg total) by mouth daily. 90 tablet 3   Azelastine HCl 137 MCG/SPRAY SOLN Place 1 spray into both nostrils daily.     diphenhydrAMINE (BENADRYL) 50 MG capsule Take 1 capsule by mouth as needed.     esomeprazole (NEXIUM) 40 MG capsule Take 1 capsule (40 mg total) by mouth daily at 12 noon. 90 capsule 1   ferrous gluconate (FERGON)  324 MG tablet Take 1 tablet by mouth every other day.     furosemide (LASIX) 20 MG tablet TAKE 2 TABLETS (40 MG TOTAL) BY MOUTH DAILY. 180 tablet 3   gabapentin (NEURONTIN) 100 MG capsule Take 1 capsule (100 mg total) by mouth at bedtime. 90 capsule 1   HYDROcodone-acetaminophen (NORCO/VICODIN) 5-325 MG tablet Take 1 tablet by mouth every 6 (six) hours as needed for moderate pain. 14 tablet 0   linaclotide (LINZESS) 145 MCG CAPS capsule Take 145 mcg by mouth daily before breakfast.     losartan (COZAAR) 100 MG tablet Take 1 tablet (100 mg total) by mouth daily. 90 tablet 3   methocarbamol (ROBAXIN) 500 MG tablet Take 500 mg by mouth every 8 (eight) hours as needed for muscle spasms.     methylphenidate (RITALIN) 20 MG tablet Take 1 tablet (20 mg total) by mouth daily. At noon 30 tablet 0   methylPREDNISolone sodium succinate (SOLU-MEDROL) 125 mg/2 mL injection      metoprolol succinate (TOPROL-XL) 50 MG 24 hr tablet TAKE 1 TABLET BY MOUTH EVERY DAY 90 tablet 3   mirabegron ER (MYRBETRIQ) 50 MG TB24 tablet Take 1 tablet by mouth daily.     NASONEX 50 MCG/ACT nasal spray Place 50 sprays into the nose daily.     ocrelizumab 600 mg in sodium chloride 0.9 % 500 mL Inject 600 mg into the vein every 6 (six) months.     OCREVUS 300 MG/10ML injection Inject  into the vein.     potassium chloride (KLOR-CON) 10 MEQ tablet Take 1 tablet (10 mEq total) by mouth daily. 90 tablet 3   pramipexole (MIRAPEX) 0.25 MG tablet Take 1 tablet (0.25 mg total) by mouth at bedtime. 30 tablet 12   sertraline (ZOLOFT) 50 MG tablet Take 1 tablet by mouth daily.     No current facility-administered medications for this visit.     Past Medical History:  Diagnosis Date   Anxiety    Arthritis    CAD (coronary artery disease)    a. 06/2014 abnl MV;  b. 06/2014 Cath: LM nl, LAD 21m (2.75x28 Promus DES), LCX nl, RCA 60-36m (FFR = 0.80 -> 3.0x24 Promus DES), EF 55-65%.   Carpal tunnel syndrome    Cervical dysplasia    Chronic  fatigue    Depression    GERD (gastroesophageal reflux disease)    Hypertension    Mitral valve prolapse    Multiple sclerosis    Pneumonia    Prolonged QT interval    RLS (restless legs syndrome) 04/28/2015    Past Surgical History:  Procedure Laterality Date   ABDOMINAL HYSTERECTOMY     BICEPS TENDON REPAIR     bladder resuspension     CARPAL TUNNEL RELEASE     CERVICAL SPINE SURGERY  10-16-2009   CORONARY ANGIOPLASTY WITH STENT PLACEMENT     DILATION AND CURETTAGE OF UTERUS     FOOT SURGERY Bilateral    For bunions   HAMMER TOE SURGERY Left    LEFT HEART CATHETERIZATION WITH CORONARY ANGIOGRAM N/A 06/29/2014   Procedure: LEFT HEART CATHETERIZATION WITH CORONARY ANGIOGRAM;  Surgeon: Micheline Chapman, MD;  Location: [facility] CATH LAB;  Service: Cardiovascular;  Laterality: N/A;   ORIF TOE FRACTURE Right    Prolong QTC     Heart   rectovaginal fistula repair     ROTATOR CUFF REPAIR Left     Social History   Socioeconomic History   Marital status: Divorced    Spouse name: Not on file   Number of children: 2   Years of education: 68   Highest education level: Not on file  Occupational History   Occupation: Disabled    Comment: Disability  Tobacco Use   Smoking status: Every Day    Packs/day: 1.00    Years: 35.00    Additional pack years: 0.00    Total pack years: 35.00    Types: Cigarettes   Smokeless tobacco: Never  Vaping Use   Vaping Use: Never used  Substance and Sexual Activity   Alcohol use: No    Alcohol/week: 0.0 standard drinks of alcohol   Drug use: No   Sexual activity: Not on file  Other Topics Concern   Not on file  Social History Narrative   Patient lives with son   Patient does not drink caffeine.   Patient is right handed.   Social Determinants of Health   Financial Resource Strain: Not on file  Food Insecurity: Not on file  Transportation Needs: Not on file  Physical Activity: Not on file  Stress: Not on file  Social Connections: Not on  file  Intimate Partner Violence: Not on file    Family History  Problem Relation Age of Onset   Melanoma Mother    Colon polyps Mother    Diabetes Mother    Breast cancer Maternal Grandmother    Colon cancer Maternal Grandmother    Brain cancer Maternal Grandfather    Lung cancer  Maternal Grandfather    Diabetes Maternal Grandfather    Kidney disease Neg Hx    Esophageal cancer Neg Hx    Gallbladder disease Neg Hx     ROS: no fevers or chills, productive cough, hemoptysis, dysphasia, odynophagia, melena, hematochezia, dysuria, hematuria, rash, seizure activity, orthopnea, PND, pedal edema, claudication. Remaining systems are negative.  Physical Exam: Well-developed well-nourished in no acute distress.  Skin is warm and dry.  HEENT is normal.  Neck is supple.  Chest is clear to auscultation with normal expansion.  Cardiovascular exam is regular rate and rhythm.  Abdominal exam nontender or distended. No masses palpated. Extremities show no edema. neuro grossly intact   A/P  1 coronary artery disease-patient denies recurrent chest pain.  Continue aspirin and statin.  2 hypertension-patient's blood pressure is controlled.  Continue present medications and follow.  3 hyperlipidemia-continue statin.  Most recent LDL 77.  Add Zetia 10 mg daily.  Check lipids and liver in 8 weeks.  4 chronic diastolic congestive heart failure-patient is euvolemic today.  Will continue diuretic at present dose.  5 history of prolonged QT interval-avoid QT prolonging medications.  No history of syncope.  6 tobacco abuse-patient counseled on discontinuing.  Olga MillersBrian Lyndel Dancel, MD

## 2022-08-15 ENCOUNTER — Ambulatory Visit: Payer: Medicare PPO | Admitting: Neurology

## 2022-08-21 ENCOUNTER — Encounter: Payer: Self-pay | Admitting: Cardiology

## 2022-08-21 ENCOUNTER — Ambulatory Visit (INDEPENDENT_AMBULATORY_CARE_PROVIDER_SITE_OTHER): Payer: Medicare PPO | Admitting: Cardiology

## 2022-08-21 VITALS — BP 118/62 | HR 80 | Ht 62.5 in | Wt 176.1 lb

## 2022-08-21 DIAGNOSIS — I2510 Atherosclerotic heart disease of native coronary artery without angina pectoris: Secondary | ICD-10-CM | POA: Diagnosis not present

## 2022-08-21 DIAGNOSIS — I10 Essential (primary) hypertension: Secondary | ICD-10-CM | POA: Diagnosis not present

## 2022-08-21 DIAGNOSIS — E785 Hyperlipidemia, unspecified: Secondary | ICD-10-CM | POA: Diagnosis not present

## 2022-08-21 MED ORDER — EZETIMIBE 10 MG PO TABS: 10.0000 mg | ORAL_TABLET | Freq: Every day | ORAL | 3 refills | Status: DC

## 2022-08-21 NOTE — Patient Instructions (Signed)
Medication Instructions:   START EZETIMIBE 10 MG ONCE DAILY  *If you need a refill on your cardiac medications before your next appointment, please call your pharmacy*   Lab Work:  Your physician recommends that you return for lab work in: 8 WEEKS-FASTING  If you have labs (blood work) drawn today and your tests are completely normal, you will receive your results only by: MyChart Message (if you have MyChart) OR A paper copy in the mail If you have any lab test that is abnormal or we need to change your treatment, we will call you to review the results.    Follow-Up: At [facility], you and your health needs are our priority.  As part of our continuing mission to provide you with exceptional heart care, we have created designated Provider Care Teams.  These Care Teams include your primary Cardiologist (physician) and Advanced Practice Providers (APPs -  Physician Assistants and Nurse Practitioners) who all work together to provide you with the care you need, when you need it.  We recommend signing up for the patient portal called "MyChart".  Sign up information is provided on this After Visit Summary.  MyChart is used to connect with patients for Virtual Visits (Telemedicine).  Patients are able to view lab/test results, encounter notes, upcoming appointments, etc.  Non-urgent messages can be sent to your provider as well.   To learn more about what you can do with MyChart, go to [email or web address].    Your next appointment:   6 month(s)  Provider:  Brian Crenshaw, MD     

## 2022-08-24 ENCOUNTER — Institutional Professional Consult (permissible substitution): Payer: Medicare PPO | Admitting: Neurology

## 2022-09-11 ENCOUNTER — Encounter: Payer: Self-pay | Admitting: Neurology

## 2022-09-11 ENCOUNTER — Other Ambulatory Visit: Payer: Self-pay

## 2022-09-11 DIAGNOSIS — G35 Multiple sclerosis: Secondary | ICD-10-CM

## 2022-09-11 MED ORDER — METHYLPHENIDATE HCL 20 MG PO TABS: 20.0000 mg | ORAL_TABLET | Freq: Every day | ORAL | 0 refills | Status: DC

## 2022-09-11 NOTE — Telephone Encounter (Signed)
Requested Prescriptions   Pending Prescriptions Disp Refills   methylphenidate (RITALIN) 20 MG tablet 30 tablet 0    Sig: Take 1 tablet (20 mg total) by mouth daily. At noon   Last seen 07/13/22 by slack, next appt:  ROUTING TO WORK IN DOCTOR  Methylphenidate HCl Adherence  Dispenses   Dispensed Days Supply Quantity Provider Pharmacy  methylphenidate 20 mg tablet 08/09/2022 30 30 tablet Ocie Doyne, MD [facility] [phone] [place]  methylphenidate 20 mg tablet 07/10/2022 30 30 tablet Glean Salvo, NP [facility] [phone] [place]  methylphenidate 20 mg tablet 06/05/2022 30 30 tablet Glean Salvo, NP [facility] [phone] [place]  methylphenidate 20 mg tablet 05/04/2022 30 30 tablet Micki Riley, MD [facility] [phone] [place]  methylphenidate 20 mg tablet 03/31/2022 30 30 tablet Huston Foley, MD [facility] [phone] [place]  methylphenidate 20 mg tablet 02/28/2022 30 30 tablet Glean Salvo, NP [facility] [phone] [place]  methylphenidate 20 mg tablet 01/28/2022 30 30 tablet Ocie Doyne, MD [facility] [phone] [place]  methylphenidate 20 mg tablet 12/28/2021 30 30 tablet Penumalli, Glenford Bayley, MD [facility] [phone] [place]...  methylphenidate 20 mg tablet 11/28/2021 30 30 tablet Penumalli, Glenford Bayley, MD [facility] [phone] [place]...  methylphenidate 20 mg tablet 10/26/2021 30 30 tablet Penumalli, Glenford Bayley, MD [facility] [phone] [place]  methylphenidate 20 mg tablet 09/21/2021 30 30 tablet Penumalli, Glenford Bayley, MD [facility] [phone] [place]..Marland Kitchen

## 2022-09-18 ENCOUNTER — Institutional Professional Consult (permissible substitution): Payer: Medicare PPO | Admitting: Neurology

## 2022-09-25 ENCOUNTER — Other Ambulatory Visit: Payer: Self-pay | Admitting: Cardiology

## 2022-09-26 ENCOUNTER — Other Ambulatory Visit: Payer: Self-pay | Admitting: *Deleted

## 2022-09-26 MED ORDER — AMLODIPINE BESYLATE 5 MG PO TABS: 5.0000 mg | ORAL_TABLET | Freq: Every day | ORAL | 3 refills | Status: DC

## 2022-09-29 ENCOUNTER — Other Ambulatory Visit: Payer: Self-pay | Admitting: *Deleted

## 2022-09-29 ENCOUNTER — Encounter: Payer: Self-pay | Admitting: Cardiology

## 2022-09-29 ENCOUNTER — Other Ambulatory Visit: Payer: Self-pay | Admitting: Cardiology

## 2022-09-29 DIAGNOSIS — R601 Generalized edema: Secondary | ICD-10-CM

## 2022-09-29 MED ORDER — ATORVASTATIN CALCIUM 80 MG PO TABS: 80.0000 mg | ORAL_TABLET | Freq: Every day | ORAL | 3 refills | Status: DC

## 2022-09-29 MED ORDER — METOPROLOL SUCCINATE ER 50 MG PO TB24: 50.0000 mg | ORAL_TABLET | Freq: Every day | ORAL | 3 refills | Status: DC

## 2022-09-29 MED ORDER — FUROSEMIDE 20 MG PO TABS
40.0000 mg | ORAL_TABLET | Freq: Every day | ORAL | 3 refills | Status: DC
Start: 2022-09-29 — End: 2023-07-23

## 2022-10-01 ENCOUNTER — Other Ambulatory Visit: Payer: Self-pay | Admitting: Cardiology

## 2022-10-01 DIAGNOSIS — R601 Generalized edema: Secondary | ICD-10-CM

## 2022-10-03 ENCOUNTER — Other Ambulatory Visit: Payer: Self-pay

## 2022-10-03 MED ORDER — LOSARTAN POTASSIUM 100 MG PO TABS: 100.0000 mg | ORAL_TABLET | Freq: Every day | ORAL | 3 refills | Status: DC

## 2022-10-12 ENCOUNTER — Other Ambulatory Visit: Payer: Self-pay

## 2022-10-12 ENCOUNTER — Encounter: Payer: Self-pay | Admitting: Neurology

## 2022-10-12 DIAGNOSIS — G35 Multiple sclerosis: Secondary | ICD-10-CM

## 2022-10-12 MED ORDER — METHYLPHENIDATE HCL 20 MG PO TABS
20.0000 mg | ORAL_TABLET | Freq: Every day | ORAL | 0 refills | Status: DC
Start: 2022-10-12 — End: 2022-11-13

## 2022-10-12 NOTE — Telephone Encounter (Signed)
Requested Prescriptions   Pending Prescriptions Disp Refills   methylphenidate (RITALIN) 20 MG tablet 30 tablet 0    Sig: Take 1 tablet (20 mg total) by mouth daily. At noon   Last seen 07/13/22, next appt scheduled for 02/01/23 Dispenses   Dispensed Days Supply Quantity Provider Pharmacy  methylphenidate 20 mg tablet 09/11/2022 30 30 tablet Asa Lente, MD [facility] [phone] [place]  methylphenidate 20 mg tablet 08/09/2022 30 30 tablet Ocie Doyne, MD [facility] [phone] [place]  methylphenidate 20 mg tablet 07/10/2022 30 30 tablet Glean Salvo, NP [facility] [phone] [place]  methylphenidate 20 mg tablet 06/05/2022 30 30 tablet Glean Salvo, NP [facility] [phone] [place]  methylphenidate 20 mg tablet 05/04/2022 30 30 tablet Micki Riley, MD [facility] [phone] [place]...  methylphenidate 20 mg tablet 03/31/2022 30 30 tablet Huston Foley, MD [facility] [phone] [place]  methylphenidate 20 mg tablet 02/28/2022 30 30 tablet Glean Salvo, NP [facility] [phone] [place]  methylphenidate 20 mg tablet 01/28/2022 30 30 tablet Ocie Doyne, MD [facility] [phone] [place]  methylphenidate 20 mg tablet 12/28/2021 30 30 tablet Penumalli, Glenford Bayley, MD [facility] [phone] [place]  methylphenidate 20 mg tablet 11/28/2021 30 30 tablet Penumalli, Glenford Bayley, MD [facility] [phone] [place]  methylphenidate 20 mg tablet 10/26/2021 30 30 tablet Penumalli, Glenford Bayley, MD [facility] [phone] [place]       Routing to sarah slack NP to fill

## 2022-10-20 LAB — HEPATIC FUNCTION PANEL
ALT: 19 IU/L (ref 0–32)
AST: 19 IU/L (ref 0–40)
Albumin: 4.3 g/dL (ref 3.8–4.9)
Alkaline Phosphatase: 172 IU/L — ABNORMAL HIGH (ref 44–121)
Bilirubin Total: 0.5 mg/dL (ref 0.0–1.2)
Bilirubin, Direct: 0.13 mg/dL (ref 0.00–0.40)
Total Protein: 6.3 g/dL (ref 6.0–8.5)

## 2022-10-20 LAB — LIPID PANEL
Chol/HDL Ratio: 2.9 ratio (ref 0.0–4.4)
Cholesterol, Total: 111 mg/dL (ref 100–199)
HDL: 38 mg/dL — ABNORMAL LOW (ref >39)
LDL Chol Calc (NIH): 48 mg/dL (ref 0–99)
Triglycerides: 148 mg/dL (ref 0–149)
VLDL Cholesterol Cal: 25 mg/dL (ref 5–40)

## 2022-10-23 ENCOUNTER — Encounter: Payer: Self-pay | Admitting: *Deleted

## 2022-11-10 ENCOUNTER — Encounter: Payer: Self-pay | Admitting: Neurology

## 2022-11-10 DIAGNOSIS — G35 Multiple sclerosis: Secondary | ICD-10-CM

## 2022-11-13 MED ORDER — METHYLPHENIDATE HCL 20 MG PO TABS: 20.0000 mg | ORAL_TABLET | Freq: Every day | ORAL | 0 refills | Status: DC

## 2022-11-13 NOTE — Telephone Encounter (Signed)
  Refill due per registry, last OV 3.07.24

## 2022-12-13 ENCOUNTER — Encounter: Payer: Self-pay | Admitting: Neurology

## 2022-12-13 DIAGNOSIS — G35 Multiple sclerosis: Secondary | ICD-10-CM

## 2022-12-13 MED ORDER — METHYLPHENIDATE HCL 20 MG PO TABS: 20.0000 mg | ORAL_TABLET | Freq: Every day | ORAL | 0 refills | Status: DC

## 2022-12-27 ENCOUNTER — Other Ambulatory Visit: Payer: Self-pay | Admitting: *Deleted

## 2022-12-27 MED ORDER — METOPROLOL SUCCINATE ER 50 MG PO TB24: 50.0000 mg | ORAL_TABLET | Freq: Every day | ORAL | 3 refills | Status: DC

## 2023-01-15 ENCOUNTER — Other Ambulatory Visit: Payer: Self-pay

## 2023-01-15 ENCOUNTER — Encounter: Payer: Self-pay | Admitting: Neurology

## 2023-01-15 DIAGNOSIS — G35 Multiple sclerosis: Secondary | ICD-10-CM

## 2023-01-15 DIAGNOSIS — G35D Multiple sclerosis, unspecified: Secondary | ICD-10-CM

## 2023-01-15 MED ORDER — METHYLPHENIDATE HCL 20 MG PO TABS
20.0000 mg | ORAL_TABLET | Freq: Every day | ORAL | 0 refills | Status: DC
Start: 2023-01-15 — End: 2023-02-12

## 2023-01-15 NOTE — Telephone Encounter (Signed)
Requested Prescriptions   Pending Prescriptions Disp Refills   methylphenidate (RITALIN) 20 MG tablet 30 tablet 0    Sig: Take 1 tablet (20 mg total) by mouth daily. At noon   Last seen 07/13/22, next appt scheduled 01/29/23  Dispenses   Dispensed Days Supply Quantity Provider Pharmacy  methylphenidate 20 mg tablet 12/13/2022 30 30 tablet Glean Salvo, NP [facility] [phone] [place]  methylphenidate 20 mg tablet 11/13/2022 30 30 tablet Glean Salvo, NP [facility] [phone] [place]  methylphenidate 20 mg tablet 10/12/2022 30 30 tablet Glean Salvo, NP [facility] [phone] [place]...  methylphenidate 20 mg tablet 09/11/2022 30 30 tablet Asa Lente, MD [facility] [phone] [place]  methylphenidate 20 mg tablet 08/09/2022 30 30 tablet Ocie Doyne, MD [facility] [phone] [place]...  methylphenidate 20 mg tablet 07/10/2022 30 30 tablet Glean Salvo, NP [facility] [phone] [place]...  methylphenidate 20 mg tablet 06/05/2022 30 30 tablet Glean Salvo, NP [facility] [phone] [place]  methylphenidate 20 mg tablet 05/04/2022 30 30 tablet Micki Riley, MD [facility] [phone] [place]...  methylphenidate 20 mg tablet 03/31/2022 30 30 tablet Huston Foley, MD [facility] [phone] [place]  methylphenidate 20 mg tablet 02/28/2022 30 30 tablet Glean Salvo, NP [facility] [phone] [place]...  methylphenidate 20 mg tablet 01/28/2022 30 30 tablet Ocie Doyne, MD [facility] [phone] [place]..Marland Kitchen

## 2023-01-18 ENCOUNTER — Telehealth: Payer: Self-pay | Admitting: Neurology

## 2023-01-18 NOTE — Telephone Encounter (Signed)
Patient's infusion nurse, Melissa called, she wanted to let me know that Summer Henderson got her Ocrevus yesterday and that she had 2 falls 8/22 and 9/10, as it was nearing time for her Ocrevus. I am seeing her on 9/23, will address then.

## 2023-01-24 ENCOUNTER — Other Ambulatory Visit: Payer: Self-pay | Admitting: *Deleted

## 2023-01-24 MED ORDER — ESOMEPRAZOLE MAGNESIUM 40 MG PO CPDR: 40.0000 mg | DELAYED_RELEASE_CAPSULE | Freq: Every day | ORAL | 3 refills | Status: DC

## 2023-01-25 NOTE — Progress Notes (Signed)
Patient: Summer Henderson Date of Birth: 03/17/1961  Reason for Visit: Follow up History from: Patient Primary Neurologist: Willis/Penumalli   ASSESSMENT AND PLAN 62 y.o. year old female   1.  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  She does not want Pepcid) -Check labs today, IgG, IgA, IgM, CBC, CMP -Recent fall, gait instability, order physical therapy for gait and balance training.  Also discuss if adaptive device for safety for the left ankle may be helpful -MRI of the brain in April 2024 showed overall stable MS consistent white matter disease  2.  Fatigue -On vitamin D -Has been off Provigil, will continue Ritalin 20 mg daily -I have referred her for sleep consult, she canceled the appointment twice, given snoring, reported apnea spells, chronic fatigue, daytime somnolence, ESS 11  3.  Depression -Sees psychologist, on Zoloft  4.  Restless leg syndrome -Continue Mirapex and gabapentin  5.  Low back pain -Discussed if continues will consider MRI lumbar spine -I ordered physical therapy  HISTORY OF PRESENT ILLNESS: Today 01/29/23 Labs in March 2023 normal IgG IgA IgM.  MRI of the brain April 2024 was overall unchanged with stable MS lesions. She got her Ocrevus infusion last week. Last week before her Ocrevus she fell at the dentist, her left ankle rolled. Her left leg is weaker at baseline. Skin to both legs are numb, feet are heavy to walk. Tendency to trip with left foot. For new weeks prior to Ocrevus, low back pain, numbness to left buttocks that resolved. Vision is fine, has urinary urgency/incontinence, sees urology regularly, has done Botox in the past that went to her bowels. Had cancelled her appointment for sleep consult with Dr. Frances Furbish x 2. On Vitamin  D. Struggles with chronic fatigue.  Update 07/13/22 SS: Scheduled for Ocrevus tomorrow via home health. They had COVID, sinus issues few weeks ago. Ocrevus had to be delayed. Her legs feel heavy, lifting cement blocks.  Has not taken Provigil in months, last refilled in August 2023. Takes Ritalin 20 mg in AM. Takes Mirapex, more for left sided crawling sensation, resolved with Mirapex. Vitamin D level was 36, on OTC supplement. TSH 1.25. A1C 6.1.  Overall issues chronic fatigue, mentions repeatedly, she limits her daily activities, hard to care for her handicapped son.  Fatigue is main issue.  I asked about sleep apnea, she does snore, has woke up gasping, as mentioned significant daytime fatigue, somnolence, ESS 11. This appointment was at 845 she had to get up at 4 AM to get herself ready.  Update 02/02/22 SS: Summer Henderson is here today for follow-up.  Remains on Ocrevus. Last infusion was in August.  Gets home infusions.  Continues with fatigue, depression, everyday is hard.  Saw PCP yesterday, Zoloft was increased.  Remains on Ritalin, also Provigil to keep her awake.  She expressed frustration with her transition of care at our office, medication refilling.  His caregiver for her son, Barbara Cower. We had a long visit discussing her concerns about her transition of care. She was upset. She stopped smoking 4 months ago.   HISTORY UPDATE (08/01/21, VRP): Since last visit, here for transition of care. Dx'd with MS in 2005 (bladder sxs, fatigue). Then has had balance issue, blurred vision, confusion.    Since last visit, symptoms are stable. Tolerating  ocrevus. Tolerating pramipexole (for restless sensation in left arm / torso). Tolerating ritalin for fatigue. Had been on provigil in the past (last filled 2020).    PRIOR HPI (02/03/21, Willis): Ms. Kacer is a 62 year old right-handed white female with a history of secondary progressive multiple sclerosis.  The patient is on Ocrevus and tolerates the medication well.   She has a chronic gait disorder, she has not had any falls since last seen.  She reports some issues with restless leg syndrome but her Mirapex is quite effective to treat this.  She began having blood in her urine this morning, she does have a history of frequent urinary tract infections.  She has no history of renal calculi.  She had a sinus infection in mid August and was treated with antibiotics for this.  She has had some decreased hearing in her right ear with fluid behind the ear and some worsening of her gait.  The patient normally uses a cane or a walker for ambulation.  She comes in today for further evaluation.   PRIOR HPI (07/08/20, Willis): Ms. Triplet is a 62 year old right-handed white female with a history of multiple sclerosis.  The patient is on Ocrevus, she last got her dose in January 2022.  She tolerates the drug fairly well.  She has not noted any definite new symptoms associated with the multiple sclerosis.  She does report some numbness in the hands, she has chronic weakness of the proximal muscles of the left leg.  She reports gait instability, she has not had any recent falls.  She has a neurogenic bladder that requires Botox injections, she has in and out catheterizations.  She is bothered significantly by a restless limb syndrome, her arms cause troubles at night, she has to move them vigorously to get rid of the sensation.  She is having trouble sleeping because of this.  She was recently taken off of Cymbalta and converted to Zoloft, the restless symptoms began shortly thereafter.  The patient has had ongoing problems with depression, she is followed through psychiatry.  She returns to the office today for an evaluation.  REVIEW OF SYSTEMS: Out of a complete 14 system review of symptoms, the patient complains only of the following symptoms, and all other reviewed systems are negative.  See HPI  ALLERGIES: Allergies  Allergen Reactions   Demerol [Meperidine] Other (See Comments)     [derm] rash, swelling, itching Other reaction(s): Other (See Comments) [derm] rash, swelling, itching   Erythromycin    Morphine And Codeine    Sulfa Antibiotics    Diflucan [Fluconazole] Other (See Comments)    States she has prolonged QT and cannot take this.   Other     Other reaction(s): Other (See Comments) Z-pak causes EKG changes, was told by cardiologist not to take this.   Sulfacetamide Sodium    Erythromycin Base Rash    [Derm] rash   Fesoterodine Palpitations   Tape Other (See Comments)    Adhesive tape And Steri Strips; Local reaction-burns skin   Toviaz [Fesoterodine Fumarate Er] Palpitations    HOME MEDICATIONS: Outpatient Medications Prior to Visit  Medication Sig Dispense Refill   albuterol (VENTOLIN HFA) 108 (90 Base) MCG/ACT inhaler Inhale 2 puffs by mouth into the lungs every 6 (six) hours as needed for wheezing or shortness of breath. 18 g 0   ALPRAZolam (XANAX) 0.5 MG tablet Take 1 tablet (0.5 mg total) by mouth once as needed for up to 1 dose for anxiety (Take 1  tablet 30-45 min prior to MRI. may repeat additional tablet at the time of the MRI if needed (must have a driver to/from MRI)). 2 tablet 0   amLODipine (NORVASC) 5 MG tablet Take 1 tablet (5 mg total) by mouth daily. 90 tablet 3   aspirin 81 MG chewable tablet Chew 1 tablet by mouth daily.     atorvastatin (LIPITOR) 80 MG tablet Take 1 tablet (80 mg total) by mouth daily. 90 tablet 3   Azelastine HCl 137 MCG/SPRAY SOLN Place 1 spray into both nostrils daily.     diphenhydrAMINE (BENADRYL) 50 MG capsule Take 1 capsule by mouth as needed.     esomeprazole (NEXIUM) 40 MG capsule Take 1 capsule (40 mg total) by mouth daily at 12 noon. 90 capsule 3   ezetimibe (ZETIA) 10 MG tablet Take 1 tablet (10 mg total) by mouth daily. 90 tablet 3   ferrous gluconate (FERGON) 324 MG tablet Take 1 tablet by mouth every other day.     furosemide (LASIX) 20 MG tablet Take 2 tablets (40 mg total) by mouth daily. 180  tablet 3   gabapentin (NEURONTIN) 100 MG capsule Take 1 capsule (100 mg total) by mouth at bedtime. 90 capsule 1   HYDROcodone-acetaminophen (NORCO/VICODIN) 5-325 MG tablet Take 1 tablet by mouth every 6 (six) hours as needed for moderate pain. 14 tablet 0   linaclotide (LINZESS) 145 MCG CAPS capsule Take 145 mcg by mouth daily before breakfast.     losartan (COZAAR) 100 MG tablet Take 1 tablet (100 mg total) by mouth daily. 90 tablet 3   methocarbamol (ROBAXIN) 500 MG tablet Take 500 mg by mouth every 8 (eight) hours as needed for muscle spasms.     methylphenidate (RITALIN) 20 MG tablet Take 1 tablet (20 mg total) by mouth daily. At noon 30 tablet 0   methylPREDNISolone sodium succinate (SOLU-MEDROL) 125 mg/2 mL injection      metoprolol succinate (TOPROL-XL) 50 MG 24 hr tablet Take 1 tablet (50 mg total) by mouth daily. Take with or immediately following a meal. 90 tablet 3   mirabegron ER (MYRBETRIQ) 50 MG TB24 tablet Take 1 tablet by mouth daily.     NASONEX 50 MCG/ACT nasal spray Place 50 sprays into the nose daily.     ocrelizumab 600 mg in sodium chloride 0.9 % 500 mL Inject 600 mg into the vein every 6 (six) months.     OCREVUS 300 MG/10ML injection Inject into the vein.     potassium chloride (KLOR-CON) 10 MEQ tablet TAKE ONE TABLET BY MOUTH ONE TIME DAILY 90 tablet 1   pramipexole (MIRAPEX) 0.25 MG tablet Take 1 tablet (0.25 mg total) by mouth at bedtime. 30 tablet 12   sertraline (ZOLOFT) 50 MG tablet Take 1 tablet by mouth daily.     No facility-administered medications prior to visit.    PAST MEDICAL HISTORY: Past Medical History:  Diagnosis Date   Anxiety    Arthritis    CAD (coronary artery disease)    a. 06/2014 abnl MV;  b. 06/2014 Cath: LM nl, LAD 19m (2.75x28 Promus DES), LCX nl, RCA 60-60m (FFR = 0.80 -> 3.0x24 Promus DES), EF 55-65%.   Carpal tunnel syndrome    Cervical dysplasia    Chronic fatigue    Depression    GERD (gastroesophageal reflux disease)     Hypertension    Mitral valve prolapse    Multiple sclerosis (HCC)    Pneumonia    Prolonged QT interval  RLS (restless legs syndrome) 04/28/2015    PAST SURGICAL HISTORY: Past Surgical History:  Procedure Laterality Date   ABDOMINAL HYSTERECTOMY     BICEPS TENDON REPAIR     bladder resuspension     CARPAL TUNNEL RELEASE     CERVICAL SPINE SURGERY  10-16-2009   CORONARY ANGIOPLASTY WITH STENT PLACEMENT     DILATION AND CURETTAGE OF UTERUS     FOOT SURGERY Bilateral    For bunions   HAMMER TOE SURGERY Left    LEFT HEART CATHETERIZATION WITH CORONARY ANGIOGRAM N/A 06/29/2014   Procedure: LEFT HEART CATHETERIZATION WITH CORONARY ANGIOGRAM;  Surgeon: Micheline Chapman, MD;  Location: [facility] CATH LAB;  Service: Cardiovascular;  Laterality: N/A;   ORIF TOE FRACTURE Right    Prolong QTC     Heart   rectovaginal fistula repair     ROTATOR CUFF REPAIR Left     FAMILY HISTORY: Family History  Problem Relation Age of Onset   Melanoma Mother    Colon polyps Mother    Diabetes Mother    Breast cancer Maternal Grandmother    Colon cancer Maternal Grandmother    Brain cancer Maternal Grandfather    Lung cancer Maternal Grandfather    Diabetes Maternal Grandfather    Kidney disease Neg Hx    Esophageal cancer Neg Hx    Gallbladder disease Neg Hx     SOCIAL HISTORY: Social History   Socioeconomic History   Marital status: Divorced    Spouse name: Not on file   Number of children: 2   Years of education: 13   Highest education level: Not on file  Occupational History   Occupation: Disabled    Comment: Disability  Tobacco Use   Smoking status: Every Day    Current packs/day: 1.00    Average packs/day: 1 pack/day for 35.0 years (35.0 ttl pk-yrs)    Types: Cigarettes   Smokeless tobacco: Never  Vaping Use   Vaping status: Never Used  Substance and Sexual Activity   Alcohol use: No    Alcohol/week: 0.0 standard drinks of alcohol   Drug use: No   Sexual activity: Not on file   Other Topics Concern   Not on file  Social History Narrative   Patient lives with son   Patient does not drink caffeine.   Patient is right handed.   Social Determinants of Health   Financial Resource Strain: Unknown (04/26/2020)   [facility] from [facility] visits prior to 07/08/2022., [facility] visits prior to 07/08/2022.   Overall Financial Resource Strain (CARDIA)    Difficulty of Paying Living Expenses: Patient refused  Food Insecurity: Low Risk  (08/31/2022)   [facility]   Hunger Vital Sign    Worried About Running Out of Food in the Last Year: Never true    Ran Out of Food in the Last Year: Never true  Transportation Needs: No Transportation Needs (08/31/2022)   [facility]   Transportation    In the past 12 months, has lack of reliable transportation kept you from medical appointments, meetings, work or from getting things needed for daily living? : No  Physical Activity: Unknown (04/26/2020)   Received from [facility] visits prior to 07/08/2022., [facility] visits prior to 07/08/2022.   Exercise Vital Sign    Days of Exercise per Week: 0 days    Minutes of Exercise per  Session: Not on file  Stress: Stress Concern Present (04/26/2020)   [facility] prior to 07/08/2022., [facility] visits prior to 07/08/2022.   [employer] of Occupational Health - Occupational Stress Questionnaire    Feeling of Stress : Rather much  Social Connections: Socially Isolated (04/26/2020)   [facility] prior to 07/08/2022., [facility] visits prior to 07/08/2022.   Social Advertising account executive [NHANES]    Frequency of Communication with Friends and Family: Never    Frequency of Social Gatherings with Friends and  Family: Never    Attends Religious Services: More than 4 times per year    Active Member of Clubs or Organizations: No    Attends Banker Meetings: Patient refused    Marital Status: Divorced  Catering manager Violence: Not At Risk (04/26/2020)   [facility] prior to 07/08/2022., [facility] visits prior to 07/08/2022.   Humiliation, Afraid, Rape, and Kick questionnaire    Fear of Current or Ex-Partner: No    Emotionally Abused: No    Physically Abused: No    Sexually Abused: No   PHYSICAL EXAM  Vitals:   01/29/23 1259  BP: 129/80  Pulse: 90  Weight: 177 lb (80.3 kg)  Height: 5\' 2"  (1.575 m)   Body mass index is 32.37 kg/m.  Generalized: Well developed, in no acute distress  Neurological examination  Mentation: Alert oriented to time, place, history taking. Follows all commands speech and language fluent Cranial nerve II-XII: Pupils were equal round reactive to light. Extraocular movements were full, visual field were full on confrontational test. Facial sensation and strength were normal. Head turning and shoulder shrug  were normal and symmetric. Motor: 3/5 left lower extremity, increased tone Sensory: Decreased soft touch to left leg Coordination: Cerebellar testing reveals good finger-nose-finger and heel-to-shin bilaterally, but hard  to lift the left  Gait and station: Gait is is cautious, independent, pushes son in wheelchair.  With walking, every few steps her left foot crosses over the right Reflexes: Deep tendon reflexes are symmetric but increased to the left DIAGNOSTIC DATA (LABS, IMAGING, TESTING) - I reviewed patient records, labs, notes, testing and imaging myself where available.  Lab Results  Component Value Date   WBC 11.8 (H) 07/13/2022   HGB 15.3 07/13/2022   HCT 46.5 07/13/2022   MCV 94 07/13/2022   PLT 436 07/13/2022      Component Value Date/Time   NA 144 07/13/2022 0949    K 4.7 07/13/2022 0949   CL 104 07/13/2022 0949   CO2 21 07/13/2022 0949   GLUCOSE 86 07/13/2022 0949   GLUCOSE 97 11/09/2018 1457   BUN 6 (L) 07/13/2022 0949   CREATININE 0.85 07/13/2022 0949   CREATININE 0.71 01/11/2016 1502   CALCIUM 9.5 07/13/2022 0949   PROT 6.3 10/19/2022 1602   ALBUMIN 4.3 10/19/2022 1602   AST 19 10/19/2022 1602   ALT 19 10/19/2022 1602   ALKPHOS 172 (H) 10/19/2022 1602   BILITOT 0.5 10/19/2022 1602   GFRNONAA 85 03/24/2020 1526   GFRNONAA >89 06/17/2014 1111   GFRAA 98 03/24/2020 1526   GFRAA >89 06/17/2014 1111   Lab Results  Component Value Date   CHOL 111 10/19/2022   HDL 38 (L) 10/19/2022   LDLCALC 48 10/19/2022   TRIG 148 10/19/2022   CHOLHDL 2.9 10/19/2022  No results found for: "HGBA1C" No results found for: "VITAMINB12" No results found for: "TSH"  Margie Ege, AGNP-C, DNP 01/29/2023, 1:06 PM [facility] [address], [address] [place], [place] [address] [phone]

## 2023-01-29 ENCOUNTER — Encounter: Payer: Self-pay | Admitting: Neurology

## 2023-01-29 ENCOUNTER — Ambulatory Visit (INDEPENDENT_AMBULATORY_CARE_PROVIDER_SITE_OTHER): Payer: Medicare PPO | Admitting: Neurology

## 2023-01-29 VITALS — BP 129/80 | HR 90 | Ht 62.0 in | Wt 177.0 lb

## 2023-01-29 DIAGNOSIS — R269 Unspecified abnormalities of gait and mobility: Secondary | ICD-10-CM | POA: Diagnosis not present

## 2023-01-29 DIAGNOSIS — G35 Multiple sclerosis: Secondary | ICD-10-CM

## 2023-01-29 DIAGNOSIS — R5382 Chronic fatigue, unspecified: Secondary | ICD-10-CM

## 2023-01-29 NOTE — Patient Instructions (Signed)
Great to see you today.  We will continue Ocrevus.  Continue other medications.  Will check labs today.  I placed an order for physical therapy.  He will see Dr. Marjory Lies in 6 months.  Please feel free to let me know if you have any questions or concerns.  Thanks!!

## 2023-01-30 LAB — COMPREHENSIVE METABOLIC PANEL
ALT: 27 IU/L (ref 0–32)
AST: 25 IU/L (ref 0–40)
Albumin: 4.2 g/dL (ref 3.8–4.9)
Alkaline Phosphatase: 179 IU/L — ABNORMAL HIGH (ref 44–121)
BUN/Creatinine Ratio: 6 — ABNORMAL LOW (ref 12–28)
BUN: 5 mg/dL — ABNORMAL LOW (ref 8–27)
Bilirubin Total: 0.4 mg/dL (ref 0.0–1.2)
CO2: 24 mmol/L (ref 20–29)
Calcium: 9.3 mg/dL (ref 8.7–10.3)
Chloride: 105 mmol/L (ref 96–106)
Creatinine, Ser: 0.83 mg/dL (ref 0.57–1.00)
Globulin, Total: 2.3 g/dL (ref 1.5–4.5)
Glucose: 76 mg/dL (ref 70–99)
Potassium: 3.8 mmol/L (ref 3.5–5.2)
Sodium: 144 mmol/L (ref 134–144)
Total Protein: 6.5 g/dL (ref 6.0–8.5)
eGFR: 81 mL/min/{1.73_m2} (ref >59)

## 2023-01-30 LAB — CBC WITH DIFFERENTIAL/PLATELET
Basophils Absolute: 0.1 10*3/uL (ref 0.0–0.2)
Basos: 0 %
EOS (ABSOLUTE): 0.1 10*3/uL (ref 0.0–0.4)
Eos: 1 %
Hematocrit: 46.4 % (ref 34.0–46.6)
Hemoglobin: 15.3 g/dL (ref 11.1–15.9)
Immature Grans (Abs): 0 10*3/uL (ref 0.0–0.1)
Immature Granulocytes: 0 %
Lymphocytes Absolute: 2.9 10*3/uL (ref 0.7–3.1)
Lymphs: 26 %
MCH: 31.4 pg (ref 26.6–33.0)
MCHC: 33 g/dL (ref 31.5–35.7)
MCV: 95 fL (ref 79–97)
Monocytes Absolute: 0.6 10*3/uL (ref 0.1–0.9)
Monocytes: 5 %
Neutrophils Absolute: 7.7 10*3/uL — ABNORMAL HIGH (ref 1.4–7.0)
Neutrophils: 68 %
Platelets: 426 10*3/uL (ref 150–450)
RBC: 4.88 x10E6/uL (ref 3.77–5.28)
RDW: 13 % (ref 11.7–15.4)
WBC: 11.4 10*3/uL — ABNORMAL HIGH (ref 3.4–10.8)

## 2023-01-30 LAB — IGG, IGA, IGM
IgA/Immunoglobulin A, Serum: 92 mg/dL (ref 87–352)
IgG (Immunoglobin G), Serum: 965 mg/dL (ref 586–1602)
IgM (Immunoglobulin M), Srm: 23 mg/dL — ABNORMAL LOW (ref 26–217)

## 2023-01-31 ENCOUNTER — Telehealth: Payer: Self-pay | Admitting: Neurology

## 2023-01-31 NOTE — Telephone Encounter (Signed)
Referral for physical therapy fax to [facility] Phone: [phone], Fax: [phone]

## 2023-02-01 ENCOUNTER — Ambulatory Visit: Payer: Medicare PPO | Admitting: Neurology

## 2023-02-08 NOTE — Progress Notes (Signed)
HPI: FU CAD, hypertension, prolonged QT and mitral valve prolapse. Holter monitor in September of 2012 showed sinus rhythm with rare PVC. Patient had an abdominal CT for abdominal pain in December 2015. 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 Nuclear study January 2016 showed an ejection fraction of 56%, probable mild breast attenuation with possible mild mid anteroseptal ischemia. Cardiac catheterization February 2016 showed 80% LAD and 60-70% RCA. Ejection fraction 55-65%. The patient had PCI of the LAD and RCA. Echocardiogram September 2020 showed normal LV function, grade 1 diastolic dysfunction.  Venous Dopplers September 2020 showed no DVT.  Chest CT February 2023 showed aortic atherosclerosis, coronary artery disease and emphysema.  Since Summer Henderson was last seen, Summer Henderson denies dyspnea, chest pain or syncope.  Summer Henderson is having problems with low back pain over the past several weeks.  Current Outpatient Medications  Medication Sig Dispense Refill   albuterol (VENTOLIN HFA) 108 (90 Base) MCG/ACT inhaler Inhale 2 puffs by mouth into the lungs every 6 (six) hours as needed for wheezing or shortness of breath. 18 g 0   amLODipine (NORVASC) 5 MG tablet Take 1 tablet (5 mg total) by mouth daily. 90 tablet 3   aspirin 81 MG chewable tablet Chew 1 tablet by mouth daily.     atorvastatin (LIPITOR) 80 MG tablet Take 1 tablet (80 mg total) by mouth daily. 90 tablet 3   Azelastine HCl 137 MCG/SPRAY SOLN Place 1 spray into both nostrils daily.     diphenhydrAMINE (BENADRYL) 50 MG capsule Take 1 capsule by mouth as needed.     esomeprazole (NEXIUM) 40 MG capsule Take 1 capsule (40 mg total) by mouth daily at 12 noon. 90 capsule 3   ezetimibe (ZETIA) 10 MG tablet Take 1 tablet (10 mg total) by mouth daily. 90 tablet 3   ferrous gluconate (FERGON) 324 MG tablet Take 1 tablet by mouth every other day.     furosemide (LASIX) 20 MG tablet Take 2 tablets (40 mg total)  by mouth daily. 180 tablet 3   gabapentin (NEURONTIN) 100 MG capsule Take 1 capsule (100 mg total) by mouth at bedtime. 90 capsule 1   HYDROcodone-acetaminophen (NORCO/VICODIN) 5-325 MG tablet Take 1 tablet by mouth every 6 (six) hours as needed for moderate pain. 14 tablet 0   linaclotide (LINZESS) 145 MCG CAPS capsule Take 145 mcg by mouth daily before breakfast.     losartan (COZAAR) 100 MG tablet Take 1 tablet (100 mg total) by mouth daily. 90 tablet 3   methocarbamol (ROBAXIN) 500 MG tablet Take 500 mg by mouth every 8 (eight) hours as needed for muscle spasms.     methylphenidate (RITALIN) 20 MG tablet Take 1 tablet (20 mg total) by mouth daily. At noon 30 tablet 0   methylPREDNISolone sodium succinate (SOLU-MEDROL) 125 mg/2 mL injection      metoprolol succinate (TOPROL-XL) 50 MG 24 hr tablet Take 1 tablet (50 mg total) by mouth daily. Take with or immediately following a meal. 90 tablet 3   mirabegron ER (MYRBETRIQ) 50 MG TB24 tablet Take 1 tablet by mouth daily.     NASONEX 50 MCG/ACT nasal spray Place 50 sprays into the nose daily.     ocrelizumab 600 mg in sodium chloride 0.9 % 500 mL Inject 600 mg into the vein every 6 (six) months.     OCREVUS 300 MG/10ML injection Inject into the vein.     potassium chloride (KLOR-CON) 10 MEQ tablet  TAKE ONE TABLET BY MOUTH ONE TIME DAILY 90 tablet 1   pramipexole (MIRAPEX) 0.25 MG tablet Take 1 tablet (0.25 mg total) by mouth at bedtime. 30 tablet 12   sertraline (ZOLOFT) 50 MG tablet Take 1 tablet by mouth daily.     No current facility-administered medications for this visit.     Past Medical History:  Diagnosis Date   Anxiety    Arthritis    CAD (coronary artery disease)    a. 06/2014 abnl MV;  b. 06/2014 Cath: LM nl, LAD 83m (2.75x28 Promus DES), LCX nl, RCA 60-59m (FFR = 0.80 -> 3.0x24 Promus DES), EF 55-65%.   Carpal tunnel syndrome    Cervical dysplasia    Chronic fatigue    Depression    GERD (gastroesophageal reflux disease)     Hypertension    Mitral valve prolapse    Multiple sclerosis (HCC)    Pneumonia    Prolonged QT interval    RLS (restless legs syndrome) 04/28/2015    Past Surgical History:  Procedure Laterality Date   ABDOMINAL HYSTERECTOMY     BICEPS TENDON REPAIR     bladder resuspension     CARPAL TUNNEL RELEASE     CERVICAL SPINE SURGERY  10-16-2009   CORONARY ANGIOPLASTY WITH STENT PLACEMENT     DILATION AND CURETTAGE OF UTERUS     FOOT SURGERY Bilateral    For bunions   HAMMER TOE SURGERY Left    LEFT HEART CATHETERIZATION WITH CORONARY ANGIOGRAM N/A 06/29/2014   Procedure: LEFT HEART CATHETERIZATION WITH CORONARY ANGIOGRAM;  Surgeon: Micheline Chapman, MD;  Location: [facility] CATH LAB;  Service: Cardiovascular;  Laterality: N/A;   ORIF TOE FRACTURE Right    Prolong QTC     Heart   rectovaginal fistula repair     ROTATOR CUFF REPAIR Left     Social History   Socioeconomic History   Marital status: Divorced    Spouse name: Not on file   Number of children: 2   Years of education: 13   Highest education level: Not on file  Occupational History   Occupation: Disabled    Comment: Disability  Tobacco Use   Smoking status: Every Day    Current packs/day: 1.00    Average packs/day: 1 pack/day for 35.0 years (35.0 ttl pk-yrs)    Types: Cigarettes   Smokeless tobacco: Never  Vaping Use   Vaping status: Never Used  Substance and Sexual Activity   Alcohol use: No    Alcohol/week: 0.0 standard drinks of alcohol   Drug use: No   Sexual activity: Not on file  Other Topics Concern   Not on file  Social History Narrative   Patient lives with son   Patient does not drink caffeine.   Patient is right handed.   Social Determinants of Health   Financial Resource Strain: Unknown (04/26/2020)   [facility] prior to 07/08/2022., [facility] visits prior to 07/08/2022.   Overall Financial Resource Strain (CARDIA)    Difficulty of  Paying Living Expenses: Patient refused  Food Insecurity: Low Risk  (08/31/2022)   [facility]   Hunger Vital Sign    Worried About Running Out of Food in the Last Year: Never true    Ran Out of Food in the Last Year: Never true  Transportation Needs: No Transportation Needs (08/31/2022)   [facility]   Transportation  In the past 12 months, has lack of reliable transportation kept you from medical appointments, meetings, work or from getting things needed for daily living? : No  Physical Activity: Unknown (04/26/2020)   Received from [facility] visits prior to 07/08/2022., [facility] visits prior to 07/08/2022.   Exercise Vital Sign    Days of Exercise per Week: 0 days    Minutes of Exercise per Session: Not on file  Stress: Stress Concern Present (04/26/2020)   [facility] prior to 07/08/2022., [facility] visits prior to 07/08/2022.   [employer] of Occupational Health - Occupational Stress Questionnaire    Feeling of Stress : Rather much  Social Connections: Socially Isolated (04/26/2020)   [facility] to 07/08/2022., [facility] visits prior to 07/08/2022.   Social Advertising account executive [NHANES]    Frequency of Communication with Friends and Family: Never    Frequency of Social Gatherings with Friends and Family: Never    Attends Religious Services: More than 4 times per year    Active Member of Clubs or Organizations: No    Attends Banker Meetings: Patient refused    Marital Status: Divorced  Catering manager Violence: Not At Risk (04/26/2020)   [facility] prior to 07/08/2022., [facility] visits prior to 07/08/2022.   Humiliation, Afraid, Rape, and Kick  questionnaire    Fear of Current or Ex-Partner: No    Emotionally Abused: No    Physically Abused: No    Sexually Abused: No    Family History  Problem Relation Age of Onset   Melanoma Mother    Colon polyps Mother    Diabetes Mother    Breast cancer Maternal Grandmother    Colon cancer Maternal Grandmother    Brain cancer Maternal Grandfather    Lung cancer Maternal Grandfather    Diabetes Maternal Grandfather    Kidney disease Neg Hx    Esophageal cancer Neg Hx    Gallbladder disease Neg Hx     ROS: Back pain but no fevers or chills, productive cough, hemoptysis, dysphasia, odynophagia, melena, hematochezia, dysuria, hematuria, rash, seizure activity, orthopnea, PND, pedal edema, claudication. Remaining systems are negative.  Physical Exam: Well-developed well-nourished in no acute distress.  Skin is warm and dry.  HEENT is normal.  Neck is supple.  Chest is clear to auscultation with normal expansion.  Cardiovascular exam is regular rate and rhythm.  Abdominal exam nontender or distended. No masses palpated. Extremities show no edema. neuro grossly intact  EKG Interpretation Date/Time:  Wednesday February 21 2023 13:59:59 EDT Ventricular Rate:  96 PR Interval:  184 QRS Duration:  60 QT Interval:  338 QTC Calculation: 427 R Axis:   74  Text Interpretation: Normal sinus rhythm Low voltage QRS Nonspecific ST and T wave abnormality When compared with ECG of 09-Aug-2021 13:46, PREVIOUS ECG IS PRESENT Confirmed by Olga Millers ([id number]) on 02/21/2023 2:01:49 PM    A/P  1 coronary artery disease-patient denies recurrent symptoms.  Continue aspirin and statin.  2 hyperlipidemia-continue Lipitor and Zetia.    3 hypertension-blood pressure elevated; however Summer Henderson states typically controlled.  Continue present medications and follow-up.  4 chronic diastolic congestive heart failure-patient is euvolemic.  Will continue diuretic at present dose.  5 tobacco abuse-patient  again counseled on discontinuing.  6 history  of prolonged QT interval-we will continue to avoid QT prolonging medications.  Note Summer Henderson has not had a history of syncope.  7 preoperative evaluation-patient feels as though Summer Henderson may require back surgery.  Prior to her recent back pain Summer Henderson was able to walk at least 1 block with no chest pain or dyspnea.  Summer Henderson may proceed without further cardiac evaluation.  Olga Millers, MD

## 2023-02-12 ENCOUNTER — Encounter: Payer: Self-pay | Admitting: Neurology

## 2023-02-12 ENCOUNTER — Other Ambulatory Visit: Payer: Self-pay

## 2023-02-12 DIAGNOSIS — G35 Multiple sclerosis: Secondary | ICD-10-CM

## 2023-02-12 NOTE — Telephone Encounter (Signed)
Requested Prescriptions   Pending Prescriptions Disp Refills   methylphenidate (RITALIN) 20 MG tablet 30 tablet 0    Sig: Take 1 tablet (20 mg total) by mouth daily. At noon   Last seen by slack on 01/29/23 Next appt scheduled 07/30/23   Dispenses  Christia Reading is of today so routing to md neurologist instead that normally sees the pt  Dispensed Days Supply Quantity Provider Pharmacy  methylphenidate 20 mg tablet 01/15/2023 30 30 tablet Glean Salvo, NP [facility] [phone] [place]  methylphenidate 20 mg tablet 12/13/2022 30 30 tablet Glean Salvo, NP [facility] [phone] [place]  methylphenidate 20 mg tablet 11/13/2022 30 30 tablet Glean Salvo, NP [facility] [phone] [place]...  methylphenidate 20 mg tablet 10/12/2022 30 30 tablet Glean Salvo, NP [facility] [phone] [place]...  methylphenidate 20 mg tablet 09/11/2022 30 30 tablet Asa Lente, MD [facility] [phone] [place]...  methylphenidate 20 mg tablet 08/09/2022 30 30 tablet Ocie Doyne, MD [facility] [phone] [place]...  methylphenidate 20 mg tablet 07/10/2022 30 30 tablet Glean Salvo, NP [facility] [phone] [place]...  methylphenidate 20 mg tablet 06/05/2022 30 30 tablet Glean Salvo, NP [facility] [phone] [place]...  methylphenidate 20 mg tablet 05/04/2022 30 30 tablet Micki Riley, MD [facility] [phone] [place]...  methylphenidate 20 mg tablet 03/31/2022 30 30 tablet Huston Foley, MD [facility] [phone] [place]...  methylphenidate 20 mg tablet 02/28/2022 30 30 tablet Glean Salvo, NP [facility] [phone] [place]..Marland Kitchen

## 2023-02-13 MED ORDER — METHYLPHENIDATE HCL 20 MG PO TABS
20.0000 mg | ORAL_TABLET | Freq: Every day | ORAL | 0 refills | Status: DC
Start: 2023-02-13 — End: 2023-03-19

## 2023-02-21 ENCOUNTER — Encounter: Payer: Self-pay | Admitting: Cardiology

## 2023-02-21 ENCOUNTER — Ambulatory Visit (INDEPENDENT_AMBULATORY_CARE_PROVIDER_SITE_OTHER): Payer: Medicare PPO | Admitting: Cardiology

## 2023-02-21 VITALS — BP 144/89 | HR 96 | Ht 62.0 in | Wt 175.0 lb

## 2023-02-21 DIAGNOSIS — I10 Essential (primary) hypertension: Secondary | ICD-10-CM | POA: Diagnosis not present

## 2023-02-21 DIAGNOSIS — E785 Hyperlipidemia, unspecified: Secondary | ICD-10-CM

## 2023-02-21 DIAGNOSIS — Z09 Encounter for follow-up examination after completed treatment for conditions other than malignant neoplasm: Secondary | ICD-10-CM

## 2023-02-21 DIAGNOSIS — I2510 Atherosclerotic heart disease of native coronary artery without angina pectoris: Secondary | ICD-10-CM | POA: Diagnosis not present

## 2023-02-21 NOTE — Patient Instructions (Signed)
Follow-Up: At [facility], you and your health needs are our priority.  As part of our continuing mission to provide you with exceptional heart care, we have created designated Provider Care Teams.  These Care Teams include your primary Cardiologist (physician) and Advanced Practice Providers (APPs -  Physician Assistants and Nurse Practitioners) who all work together to provide you with the care you need, when you need it.  We recommend signing up for the patient portal called "MyChart".  Sign up information is provided on this After Visit Summary.  MyChart is used to connect with patients for Virtual Visits (Telemedicine).  Patients are able to view lab/test results, encounter notes, upcoming appointments, etc.  Non-urgent messages can be sent to your provider as well.   To learn more about what you can do with MyChart, go to [email or web address].    Your next appointment:   6 month(s)  Provider:   Olga Millers MD

## 2023-03-06 ENCOUNTER — Telehealth: Payer: Self-pay | Admitting: *Deleted

## 2023-03-06 NOTE — Telephone Encounter (Signed)
   Pre-operative Risk Assessment    Patient Name: Summer Henderson  DOB: 1961-02-18 MRN: [id number]    DATE OF LAST VISIT: 02/21/23 DR. CRENSHAW DATE OF NEXT VISIT: NONE  Request for Surgical Clearance    Procedure:   L4-5 LUMBAR FUSION  Date of Surgery:  Clearance TBD                                 Surgeon:  DR. Marikay Alar Surgeon's Group or Practice Name:  [facility] Phone number:  [phone] Fax number:  [phone] ATTN: Erie Noe EXT [id number]   Type of Clearance Requested:   - Medical ; ASA    Type of Anesthesia:  General    Additional requests/questions:    Elpidio Anis   03/06/2023, 6:01 PM

## 2023-03-07 ENCOUNTER — Encounter: Payer: Self-pay | Admitting: Neurology

## 2023-03-07 NOTE — Telephone Encounter (Signed)
I called the patient, we discussed that she saw Dr. Yetta Barre for low back pain, note is below. The neurosurgery office is working to get approval for her procedure.   Spondylolisthesis with severe foraminal stenosis and some lateral recess stenosis L4-5 with back pain and left lower extremity radiculopathyThe patient's clinical condition is marked by substantial pain and/or significant functional disability. Other conservative therapy has not provided relief, is contraindicated, or not appropriate. There is a reasonable likelihood that surgery will significantly improve the patient's pain and/or functional impairment. I have recommended a posterior lumbar interbody fusion with instrumentation at L4-5. I have gone over the procedure in detail utilizing a spine model. We have talked about typical outcomes and recovery times. They understand the risks of the surgery include but are not limited to bleeding, infection, CSF leak, nerve root injury, numbness, weakness, pseudoarthrosis, hardware failure, misplaced hardware, adjacent level disease, need for further surgery, lack of relief of pain, worsening pain, loss of bowel bladder or sexual function, need for blood transfusion, and anesthesia risk including DVT pneumonia MI and death. They agreed to proceed.

## 2023-03-07 NOTE — Telephone Encounter (Signed)
   Patient Name: Summer Henderson  DOB: 1961-01-17 MRN: [id number]  Primary Cardiologist: None  Chart reviewed as part of pre-operative protocol coverage. Given past medical history and time since last visit, based on ACC/AHA guidelines, Summer Henderson is at acceptable risk for the planned procedure without further cardiovascular testing.   Regarding ASA therapy, we recommend continuation of ASA throughout the perioperative period.  However, if the surgeon feels that cessation of ASA is required in the perioperative period, it may be stopped 5-7 days prior to surgery with a plan to resume it as soon as felt to be feasible from a surgical standpoint in the post-operative period.   The patient was advised that if she develops new symptoms prior to surgery to contact our office to arrange for a follow-up visit, and she verbalized understanding.  I will route this recommendation to the requesting party via Epic fax function and remove from pre-op pool.  Please call with questions.  Napoleon Form, Leodis Rains, NP 03/07/2023, 8:17 AM

## 2023-03-09 ENCOUNTER — Other Ambulatory Visit: Payer: Self-pay | Admitting: Neurological Surgery

## 2023-03-14 ENCOUNTER — Other Ambulatory Visit: Payer: Self-pay

## 2023-03-14 ENCOUNTER — Emergency Department (HOSPITAL_BASED_OUTPATIENT_CLINIC_OR_DEPARTMENT_OTHER)
Admission: EM | Admit: 2023-03-14 | Discharge: 2023-03-14 | Disposition: A | Discharge status: Home / Self Care | Payer: Medicare PPO | Attending: Emergency Medicine | Admitting: Emergency Medicine

## 2023-03-14 ENCOUNTER — Encounter (HOSPITAL_BASED_OUTPATIENT_CLINIC_OR_DEPARTMENT_OTHER): Payer: Self-pay | Admitting: Emergency Medicine

## 2023-03-14 DIAGNOSIS — M545 Low back pain, unspecified: Secondary | ICD-10-CM | POA: Diagnosis present

## 2023-03-14 DIAGNOSIS — Z7982 Long term (current) use of aspirin: Secondary | ICD-10-CM | POA: Insufficient documentation

## 2023-03-14 DIAGNOSIS — M5440 Lumbago with sciatica, unspecified side: Secondary | ICD-10-CM | POA: Diagnosis not present

## 2023-03-14 MED ORDER — KETOROLAC TROMETHAMINE 15 MG/ML IJ SOLN
15.0000 mg | Freq: Once | INTRAMUSCULAR | Status: AC
  Administered 2023-03-14: 15 mg via INTRAMUSCULAR
  Filled 2023-03-14: qty 1

## 2023-03-14 MED ORDER — HYDROMORPHONE HCL 1 MG/ML IJ SOLN
1.0000 mg | Freq: Once | INTRAMUSCULAR | Status: AC
  Administered 2023-03-14: 1 mg via INTRAMUSCULAR
  Filled 2023-03-14: qty 1

## 2023-03-14 MED ORDER — OXYCODONE-ACETAMINOPHEN 5-325 MG PO TABS: 1.0000 | ORAL_TABLET | Freq: Four times a day (QID) | ORAL | 0 refills | Status: AC | PRN

## 2023-03-14 NOTE — ED Provider Notes (Signed)
[facility] Provider Note   CSN: [id number] Arrival date & time: 03/14/23  1405     History  Chief Complaint  Patient presents with   Back Pain    Summer Henderson is a 62 y.o. female.  Patient complains of severe lower back pain.  Patient is scheduled to have a lumbar fusion on 11/27.  Patient reports that she is not on any pain medication.  She has been taking Tylenol and ibuprofen without relief.  Patient is having severe pain today she is requesting help with pain management.  Patient states the pain is bad today and she cannot function.  Patient denies any new areas of pain she has not had any fever or chills she denies any new areas of numbness.  Patient has been evaluated by Dr. Yetta Barre and will need surgery.  The history is provided by the patient. No language interpreter was used.  Back Pain      Home Medications Prior to Admission medications   Medication Sig Start Date End Date Taking? Authorizing Provider  albuterol (VENTOLIN HFA) 108 (90 Base) MCG/ACT inhaler Inhale 2 puffs by mouth into the lungs every 6 (six) hours as needed for wheezing or shortness of breath. 08/09/21   Vanetta Mulders, MD  amLODipine (NORVASC) 5 MG tablet Take 1 tablet (5 mg total) by mouth daily. 09/26/22   Lewayne Bunting, MD  aspirin 81 MG chewable tablet Chew 1 tablet by mouth daily.    [provider]  atorvastatin (LIPITOR) 80 MG tablet Take 1 tablet (80 mg total) by mouth daily. 09/29/22   Lewayne Bunting, MD  Azelastine HCl 137 MCG/SPRAY SOLN Place 1 spray into both nostrils daily. 08/11/21   [provider]  diphenhydrAMINE (BENADRYL) 50 MG capsule Take 1 capsule by mouth as needed. 05/17/21   [provider]  esomeprazole (NEXIUM) 40 MG capsule Take 1 capsule (40 mg total) by mouth daily at 12 noon. 01/24/23   Lewayne Bunting, MD  ezetimibe (ZETIA) 10 MG tablet Take 1 tablet (10 mg total) by mouth daily. 08/21/22    Lewayne Bunting, MD  ferrous gluconate (FERGON) 324 MG tablet Take 1 tablet by mouth every other day. 07/25/21   [provider]  furosemide (LASIX) 20 MG tablet Take 2 tablets (40 mg total) by mouth daily. 09/29/22   Lewayne Bunting, MD  gabapentin (NEURONTIN) 100 MG capsule Take 1 capsule (100 mg total) by mouth at bedtime. 07/13/22   Glean Salvo, NP  HYDROcodone-acetaminophen (NORCO/VICODIN) 5-325 MG tablet Take 1 tablet by mouth every 6 (six) hours as needed for moderate pain. 08/09/21   Vanetta Mulders, MD  linaclotide [facility]) 145 MCG CAPS capsule Take 145 mcg by mouth daily before breakfast.    [provider]  losartan (COZAAR) 100 MG tablet Take 1 tablet (100 mg total) by mouth daily. 10/03/22   Lewayne Bunting, MD  methocarbamol (ROBAXIN) 500 MG tablet Take 500 mg by mouth every 8 (eight) hours as needed for muscle spasms.    [provider]  methylphenidate (RITALIN) 20 MG tablet Take 1 tablet (20 mg total) by mouth daily. At noon 02/13/23   Penumalli, Glenford Bayley, MD  methylPREDNISolone sodium succinate (SOLU-MEDROL) 125 mg/2 mL injection  12/05/21   [provider]  metoprolol succinate (TOPROL-XL) 50 MG 24 hr tablet Take 1 tablet (50 mg total) by mouth daily. Take with or immediately following a meal. 12/27/22   Crenshaw,  Madolyn Frieze, MD  mirabegron ER (MYRBETRIQ) 50 MG TB24 tablet Take 1 tablet by mouth daily. 07/25/20   [provider]  NASONEX 50 MCG/ACT nasal spray Place 50 sprays into the nose daily. 09/16/12   [provider]  ocrelizumab 600 mg in sodium chloride 0.9 % 500 mL Inject 600 mg into the vein every 6 (six) months.    [provider]  OCREVUS 300 MG/10ML injection Inject into the vein. 12/06/21   [provider]  potassium chloride (KLOR-CON) 10 MEQ tablet TAKE ONE TABLET BY MOUTH ONE TIME DAILY 10/03/22   Lewayne Bunting, MD  pramipexole (MIRAPEX) 0.25 MG tablet Take 1 tablet (0.25 mg total) by mouth at  bedtime. 07/13/22   Glean Salvo, NP  sertraline (ZOLOFT) 50 MG tablet Take 1 tablet by mouth daily. 07/25/21   [provider]      Allergies    Demerol [meperidine], Erythromycin, Morphine and codeine, Sulfa antibiotics, Diflucan [fluconazole], Other, Sulfacetamide sodium, Erythromycin base, Fesoterodine, Tape, and Toviaz [fesoterodine fumarate er]    Review of Systems   Review of Systems  Musculoskeletal:  Positive for back pain.  All other systems reviewed and are negative.   Physical Exam Updated Vital Signs BP 103/89 (BP Location: Left Arm)   Pulse 68   Temp 97.7 F (36.5 C)   Resp 18   Wt 78 kg   SpO2 97%   BMI 31.46 kg/m  Physical Exam Vitals and nursing note reviewed.  Constitutional:      Appearance: She is well-developed.  HENT:     Head: Normocephalic.     Mouth/Throat:     Mouth: Mucous membranes are moist.  Eyes:     Pupils: Pupils are equal, round, and reactive to light.  Cardiovascular:     Rate and Rhythm: Normal rate.  Pulmonary:     Effort: Pulmonary effort is normal.  Abdominal:     General: There is no distension.  Musculoskeletal:        General: No swelling or tenderness. Normal range of motion.     Cervical back: Normal range of motion.  Skin:    General: Skin is warm.  Neurological:     Mental Status: She is alert and oriented to person, place, and time.  Psychiatric:        Mood and Affect: Mood normal.     ED Results / Procedures / Treatments   Labs (all labs ordered are listed, but only abnormal results are displayed) Labs Reviewed - No data to display  EKG None  Radiology No results found.  Procedures Procedures    Medications Ordered in ED Medications  HYDROmorphone (DILAUDID) injection 1 mg (has no administration in time range)  ketorolac (TORADOL) 15 MG/ML injection 15 mg (has no administration in time range)    ED Course/ Medical Decision Making/ A&P                                 Medical Decision  Making Patient complains of severe pain.  Patient reports that she cannot tolerate the pain.  Patient reports that she has been rotating Tylenol and ibuprofen without relief.  Amount and/or Complexity of Data Reviewed Discussion of management or test interpretation with external provider(s): Given injection of Toradol 15 mg Dilaudid 1 mg.  I discussed with patient pain management I will give her a prescription for Percocet.  She is advised that she needs  to discuss pain management with her neurosurgeon and her primary care physician.  Risk Prescription drug management.           Final Clinical Impression(s) / ED Diagnoses Final diagnoses:  Acute low back pain with sciatica, sciatica laterality unspecified, unspecified back pain laterality    Rx / DC Orders ED Discharge Orders     None     An After Visit Summary was printed and given to the patient.     Elson Areas, PA-C 03/14/23 1552    Rolan Bucco, MD 03/17/23 [phone]

## 2023-03-14 NOTE — ED Triage Notes (Signed)
Lower back pain , worse to left , radiating to buttocks . Reports chronic pain and has surgery scheduled for 11/27. Home pain meds ans steroid were no help .  Obvious distress and unable to ambulate with steady gait .

## 2023-03-14 NOTE — ED Notes (Signed)
Pt. Has a driver and tolerated medication well

## 2023-03-14 NOTE — ED Notes (Signed)
Per Pt. She has issues with her lower extremities due to MS.

## 2023-03-14 NOTE — ED Notes (Signed)
Pt. Has long history of back pain with extensive medical history.  Pt. Using w/c to get around due to trouble ambulating.  Pt. To have surgery on her back on Nov. 27,2024.

## 2023-03-16 ENCOUNTER — Encounter: Payer: Self-pay | Admitting: Cardiology

## 2023-03-16 ENCOUNTER — Encounter: Payer: Self-pay | Admitting: Neurology

## 2023-03-16 DIAGNOSIS — M7989 Other specified soft tissue disorders: Secondary | ICD-10-CM

## 2023-03-19 ENCOUNTER — Other Ambulatory Visit: Payer: Self-pay

## 2023-03-19 DIAGNOSIS — G35 Multiple sclerosis: Secondary | ICD-10-CM

## 2023-03-19 MED ORDER — METHYLPHENIDATE HCL 20 MG PO TABS: 20.0000 mg | ORAL_TABLET | Freq: Every day | ORAL | 0 refills | Status: DC

## 2023-03-19 NOTE — Telephone Encounter (Signed)
Requested Prescriptions   Pending Prescriptions Disp Refills   methylphenidate (RITALIN) 20 MG tablet 30 tablet 0    Sig: Take 1 tablet (20 mg total) by mouth daily. At noon   Last seen 01/29/23 Next appt 07/30/23  Dispenses    Dispensed Days Supply Quantity Provider Pharmacy  methylphenidate 20 mg tablet 02/13/2023 30 30 tablet Suanne Marker, MD [facility] [phone] [place]  methylphenidate 20 mg tablet 01/15/2023 30 30 tablet Glean Salvo, NP [facility] [phone] [place]  methylphenidate 20 mg tablet 12/13/2022 30 30 tablet Glean Salvo, NP [facility] [phone] [place]...  methylphenidate 20 mg tablet 11/13/2022 30 30 tablet Glean Salvo, NP [facility] [phone] [place]  methylphenidate 20 mg tablet 10/12/2022 30 30 tablet Glean Salvo, NP [facility] [phone] [place]...  methylphenidate 20 mg tablet 09/11/2022 30 30 tablet Asa Lente, MD [facility] [phone] [place]...  methylphenidate 20 mg tablet 08/09/2022 30 30 tablet Ocie Doyne, MD [facility] [phone] [place]...  methylphenidate 20 mg tablet 07/10/2022 30 30 tablet Glean Salvo, NP [facility] [phone] [place]...  methylphenidate 20 mg tablet 06/05/2022 30 30 tablet Glean Salvo, NP [facility] [phone] [place]  methylphenidate 20 mg tablet 05/04/2022 30 30 tablet Micki Riley, MD [facility] [phone] [place]...  methylphenidate 20 mg tablet 03/31/2022 30 30 tablet Huston Foley, MD [facility] [phone] [place]..Marland Kitchen

## 2023-03-20 ENCOUNTER — Encounter: Payer: Self-pay | Admitting: Neurology

## 2023-03-20 NOTE — Progress Notes (Signed)
Surgical Instructions   Your procedure is scheduled on Wednesday April 04, 2023. Report to [facility] Main Entrance "A" at 8:00 A.M., then check in with the Admitting office. Any questions or running late day of surgery: call [phone]  Questions prior to your surgery date: call [phone], Monday-Friday, 8am-4pm. If you experience any cold or flu symptoms such as cough, fever, chills, shortness of breath, etc. between now and your scheduled surgery, please notify us at the above number.     Remember:  Do not eat or drink after midnight the night before your surgery   Take these medicines the morning of surgery with A SIP OF WATER  amLODipine (NORVASC)  atorvastatin (LIPITOR)  DULoxetine (CYMBALTA)  ezetimibe (ZETIA)  MEDROL 4 MG TBPK tablet  metoprolol succinate (TOPROL-XL)  mirabegron ER (MYRBETRIQ)    May take these medicines IF NEEDED: albuterol (VENTOLIN HFA) 108 (90 Base) MCG/ACT inhaler. Please bring inhaler with you to the hospital.   Azelastine HCl 137 MCG/SPRAY SOLN    PER YOUR CARDIOLOGIST'S INSTRUCTIONS, PLEASE HOLD YOUR aspirin FOR 5 DAYS PRIOR TO SURGERY, WITH THE LAST DOSE BEING 03/29/2023.    One week prior to surgery, STOP taking any Aspirin (unless otherwise instructed by your surgeon) Aleve, Naproxen, Ibuprofen, Motrin, Advil, Goody's, BC's, all herbal medications, fish oil, and non-prescription vitamins.                     Do NOT Smoke (Tobacco/Vaping) for 24 hours prior to your procedure.  If you use a CPAP at night, you may bring your mask/headgear for your overnight stay.   You will be asked to remove any contacts, glasses, piercing's, hearing aid's, dentures/partials prior to surgery. Please bring cases for these items if needed.    Patients discharged the day of surgery will not be allowed to drive home, and someone needs to stay with them for 24 hours.  SURGICAL WAITING ROOM VISITATION Patients may have no more than 2 support people in  the waiting area - these visitors may rotate.   Pre-op nurse will coordinate an appropriate time for 1 ADULT support person, who may not rotate, to accompany patient in pre-op.  Children under the age of 35 must have an adult with them who is not the patient and must remain in the main waiting area with an adult.  If the patient needs to stay at the hospital during part of their recovery, the visitor guidelines for inpatient rooms apply.  Please refer to the [facility] website for the visitor guidelines for any additional information.   If you received a COVID test during your pre-op visit  it is requested that you wear a mask when out in public, stay away from anyone that may not be feeling well and notify your surgeon if you develop symptoms. If you have been in contact with anyone that has tested positive in the last 10 days please notify you surgeon.      Pre-operative 5 CHG Bathing Instructions   You can play a key role in reducing the risk of infection after surgery. Your skin needs to be as free of germs as possible. You can reduce the number of germs on your skin by washing with CHG (chlorhexidine gluconate) soap before surgery. CHG is an antiseptic soap that kills germs and continues to kill germs even after washing.   DO NOT use if you have an allergy to chlorhexidine/CHG or antibacterial soaps. If your skin becomes reddened or irritated, stop  using the CHG and notify one of our RNs at [phone].   Please shower with the CHG soap starting 4 days before surgery using the following schedule:     Please keep in mind the following:  DO NOT shave, including legs and underarms, starting the day of your first shower.   You may shave your face at any point before/day of surgery.  Place clean sheets on your bed the day you start using CHG soap. Use a clean washcloth (not used since being washed) for each shower. DO NOT sleep with pets once you start using the CHG.   CHG Shower  Instructions:  Wash your face and private area with normal soap. If you choose to wash your hair, wash first with your normal shampoo.  After you use shampoo/soap, rinse your hair and body thoroughly to remove shampoo/soap residue.  Turn the water OFF and apply about 3 tablespoons (45 ml) of CHG soap to a CLEAN washcloth.  Apply CHG soap ONLY FROM YOUR NECK DOWN TO YOUR TOES (washing for 3-5 minutes)  DO NOT use CHG soap on face, private areas, open wounds, or sores.  Pay special attention to the area where your surgery is being performed.  If you are having back surgery, having someone wash your back for you may be helpful. Wait 2 minutes after CHG soap is applied, then you may rinse off the CHG soap.  Pat dry with a clean towel  Put on clean clothes/pajamas   If you choose to wear lotion, please use ONLY the CHG-compatible lotions on the back of this paper.   Additional instructions for the day of surgery: DO NOT APPLY any lotions, deodorants or perfumes.   Do not bring valuables to the hospital. [facility] is not responsible for any belongings/valuables. Do not wear nail polish, gel polish, artificial nails, or any other type of covering on natural nails (fingers and toes) Do not wear jewelry or makeup Put on clean/comfortable clothes.  Please brush your teeth.  Ask your nurse before applying any prescription medications to the skin.     CHG Compatible Lotions   Aveeno Moisturizing lotion  Cetaphil Moisturizing Cream  Cetaphil Moisturizing Lotion  Clairol Herbal Essence Moisturizing Lotion, Dry Skin  Clairol Herbal Essence Moisturizing Lotion, Extra Dry Skin  Clairol Herbal Essence Moisturizing Lotion, Normal Skin  Curel Age Defying Therapeutic Moisturizing Lotion with Alpha Hydroxy  Curel Extreme Care Body Lotion  Curel Soothing Hands Moisturizing Hand Lotion  Curel Therapeutic Moisturizing Cream, Fragrance-Free  Curel Therapeutic Moisturizing Lotion, Fragrance-Free  Curel  Therapeutic Moisturizing Lotion, Original Formula  Eucerin Daily Replenishing Lotion  Eucerin Dry Skin Therapy Plus Alpha Hydroxy Crme  Eucerin Dry Skin Therapy Plus Alpha Hydroxy Lotion  Eucerin Original Crme  Eucerin Original Lotion  Eucerin Plus Crme Eucerin Plus Lotion  Eucerin TriLipid Replenishing Lotion  Keri Anti-Bacterial Hand Lotion  Keri Deep Conditioning Original Lotion Dry Skin Formula Softly Scented  Keri Deep Conditioning Original Lotion, Fragrance Free Sensitive Skin Formula  Keri Lotion Fast Absorbing Fragrance Free Sensitive Skin Formula  Keri Lotion Fast Absorbing Softly Scented Dry Skin Formula  Keri Original Lotion  Keri Skin Renewal Lotion Keri Silky Smooth Lotion  Keri Silky Smooth Sensitive Skin Lotion  Nivea Body Creamy Conditioning Oil  Nivea Body Extra Enriched Teacher, adult education Moisturizing Lotion Nivea Crme  Nivea Skin Firming Lotion  NutraDerm 30 Skin Lotion  NutraDerm Skin Lotion  NutraDerm Therapeutic Skin Cream  NutraDerm  Therapeutic Skin Lotion  ProShield Protective Hand Cream  Provon moisturizing lotion  Please read over the following fact sheets that you were given.

## 2023-03-21 ENCOUNTER — Other Ambulatory Visit: Payer: Self-pay

## 2023-03-21 ENCOUNTER — Encounter (HOSPITAL_COMMUNITY)
Admission: RE | Admit: 2023-03-21 | Discharge: 2023-03-21 | Disposition: A | Discharge status: Home / Self Care | Payer: Medicare PPO | Source: Ambulatory Visit | Attending: Neurological Surgery | Admitting: Neurological Surgery

## 2023-03-21 ENCOUNTER — Encounter (HOSPITAL_COMMUNITY): Payer: Self-pay

## 2023-03-21 VITALS — BP 134/81 | HR 77 | Temp 98.1°F | Resp 18 | Ht 62.0 in | Wt 171.0 lb

## 2023-03-21 DIAGNOSIS — Z01812 Encounter for preprocedural laboratory examination: Secondary | ICD-10-CM | POA: Diagnosis present

## 2023-03-21 DIAGNOSIS — G35 Multiple sclerosis: Secondary | ICD-10-CM | POA: Diagnosis not present

## 2023-03-21 DIAGNOSIS — I5189 Other ill-defined heart diseases: Secondary | ICD-10-CM

## 2023-03-21 DIAGNOSIS — Z01818 Encounter for other preprocedural examination: Secondary | ICD-10-CM

## 2023-03-21 HISTORY — DX: Fibromyalgia: M79.7

## 2023-03-21 HISTORY — DX: Prediabetes: R73.03

## 2023-03-21 HISTORY — DX: Family history of other specified conditions: Z84.89

## 2023-03-21 HISTORY — DX: Chronic obstructive pulmonary disease, unspecified: J44.9

## 2023-03-21 LAB — CBC
HCT: 44.9 % (ref 36.0–46.0)
Hemoglobin: 15.3 g/dL — ABNORMAL HIGH (ref 12.0–15.0)
MCH: 31 pg (ref 26.0–34.0)
MCHC: 34.1 g/dL (ref 30.0–36.0)
MCV: 90.9 fL (ref 80.0–100.0)
Platelets: 513 10*3/uL — ABNORMAL HIGH (ref 150–400)
RBC: 4.94 MIL/uL (ref 3.87–5.11)
RDW: 13.8 % (ref 11.5–15.5)
WBC: 19 10*3/uL — ABNORMAL HIGH (ref 4.0–10.5)
nRBC: 0 % (ref 0.0–0.2)

## 2023-03-21 LAB — BASIC METABOLIC PANEL
Anion gap: 10 (ref 5–15)
BUN: 9 mg/dL (ref 6–20)
CO2: 27 mmol/L (ref 22–32)
Calcium: 9 mg/dL (ref 8.9–10.3)
Chloride: 103 mmol/L (ref 98–111)
Creatinine, Ser: 1.09 mg/dL — ABNORMAL HIGH (ref 0.44–1.00)
GFR, Estimated: 58 mL/min — ABNORMAL LOW (ref >60)
Glucose, Bld: 96 mg/dL (ref 70–99)
Potassium: 2.9 mmol/L — ABNORMAL LOW (ref 3.5–5.1)
Sodium: 140 mmol/L (ref 135–145)

## 2023-03-21 LAB — PROTIME-INR
INR: 1 (ref 0.8–1.2)
Prothrombin Time: 13.1 s (ref 11.4–15.2)

## 2023-03-21 LAB — TYPE AND SCREEN
ABO/RH(D): O POS
Antibody Screen: NEGATIVE

## 2023-03-21 LAB — SURGICAL PCR SCREEN
MRSA, PCR: NEGATIVE
Staphylococcus aureus: NEGATIVE

## 2023-03-21 NOTE — Progress Notes (Addendum)
PCP - Dr. Henri Medal  Cardiologist - Dr. Jens Som  EP-no  Endocrine-no  Pulm-no  Neurologist- is seen at [facility]for MS  Chest x-ray - na  EKG - 02/11/23  Stress Test - no  ECHO - 2020  Cardiac Cath - 06/30/14  AICD-no PM-no LOOP-no  Nerve Stimulator-no  Dialysis-no  Sleep Study - no CPAP - no  LABS-CBC, BMP, T/S, PT/INR, PCR  ASA-cardiologist and surgeon instructed patient to hold 5-7 days  ERAS-no  HA1C-na GLP-1-no Fasting Blood Sugar - na Checks Blood Sugar _na____ times a day  Anesthesia- Patient states she cannot feel her legs, right one is becoming cold-as ordered doppler studies.  Pt denies having chest pain, sob, or fever at this time. All instructions explained to the pt, with a verbal understanding of the material. Pt agrees to go over the instructions while at home for a better understanding. The opportunity to ask questions was provided.

## 2023-03-21 NOTE — Progress Notes (Signed)
Surgical Instructions   Your procedure is scheduled on Wednesday April 04, 2023. Report to [facility] Main Main Entrance "A" at 8:00 A.M., then check in with the Admitting office. Any questions or running late day of surgery: call [phone]  Questions prior to your surgery date: call [phone], Monday-Friday, 8am-4pm. If you experience any cold or flu symptoms such as cough, fever, chills, shortness of breath, etc. between now and your scheduled surgery, please notify us at the above number.     Remember:  Do not eat or drink after midnight the night before your surgery   Take these medicines the morning of surgery with A SIP OF WATER        DULoxetine (CYMBALTA)  metoprolol succinate (TOPROL-XL)  mirabegron ER (MYRBETRIQ)    May take these medicines IF NEEDED: albuterol (VENTOLIN HFA) 108 (90 Base) MCG/ACT inhaler. Please bring inhaler with you to the hospital.   Azelastine HCl 137 MCG/SPRAY SOLN    PER YOUR CARDIOLOGIST'S INSTRUCTIONS, PLEASE HOLD YOUR aspirin FOR 5 DAYS PRIOR TO SURGERY, WITH THE LAST DOSE BEING 03/29/2023.    One week prior to surgery, STOP taking any Aspirin (unless otherwise instructed by your surgeon) Aleve, Naproxen, Ibuprofen, Motrin, Advil, Goody's, BC's, all herbal medications, fish oil, and non-prescription vitamins.                     Do NOT Smoke (Tobacco/Vaping) for 24 hours prior to your procedure.  If you use a CPAP at night, you may bring your mask/headgear for your overnight stay.   You will be asked to remove any contacts, glasses, piercing's, hearing aid's, dentures/partials prior to surgery. Please bring cases for these items if needed.    Patients discharged the day of surgery will not be allowed to drive home, and someone needs to stay with them for 24 hours.  SURGICAL WAITING ROOM VISITATION Patients may have no more than 2 support people in the waiting area - these visitors may rotate.   Pre-op nurse will coordinate an  appropriate time for 1 ADULT support person, who may not rotate, to accompany patient in pre-op.  Children under the age of 41 must have an adult with them who is not the patient and must remain in the main waiting area with an adult.  If the patient needs to stay at the hospital during part of their recovery, the visitor guidelines for inpatient rooms apply.  Please refer to the [facility] website for the visitor guidelines for any additional information.   If you received a COVID test during your pre-op visit  it is requested that you wear a mask when out in public, stay away from anyone that may not be feeling well and notify your surgeon if you develop symptoms. If you have been in contact with anyone that has tested positive in the last 10 days please notify you surgeon.      Pre-operative 5 CHG Bathing Instructions   You can play a key role in reducing the risk of infection after surgery. Your skin needs to be as free of germs as possible. You can reduce the number of germs on your skin by washing with CHG (chlorhexidine gluconate) soap before surgery. CHG is an antiseptic soap that kills germs and continues to kill germs even after washing.   DO NOT use if you have an allergy to chlorhexidine/CHG or antibacterial soaps. If your skin becomes reddened or irritated, stop using the CHG and notify one of our RNs  at [phone].   Please shower with the CHG soap starting 4 days before surgery using the following schedule:     Please keep in mind the following:  DO NOT shave, including legs and underarms, starting the day of your first shower.   You may shave your face at any point before/day of surgery.  Place clean sheets on your bed the day you start using CHG soap. Use a clean washcloth (not used since being washed) for each shower. DO NOT sleep with pets once you start using the CHG.   CHG Shower Instructions:  Wash your face and private area with normal soap. If you choose to  wash your hair, wash first with your normal shampoo.  After you use shampoo/soap, rinse your hair and body thoroughly to remove shampoo/soap residue.  Turn the water OFF and apply about 3 tablespoons (45 ml) of CHG soap to a CLEAN washcloth.  Apply CHG soap ONLY FROM YOUR NECK DOWN TO YOUR TOES (washing for 3-5 minutes)  DO NOT use CHG soap on face, private areas, open wounds, or sores.  Pay special attention to the area where your surgery is being performed.  If you are having back surgery, having someone wash your back for you may be helpful. Wait 2 minutes after CHG soap is applied, then you may rinse off the CHG soap.  Pat dry with a clean towel  Put on clean clothes/pajamas   If you choose to wear lotion, please use ONLY the CHG-compatible lotions on the back of this paper.   Additional instructions for the day of surgery: DO NOT APPLY any lotions, deodorants or perfumes.   Do not bring valuables to the hospital. [facility] is not responsible for any belongings/valuables. Do not wear nail polish, gel polish, artificial nails, or any other type of covering on natural nails (fingers and toes) Do not wear jewelry or makeup Put on clean/comfortable clothes.  Please brush your teeth.  Ask your nurse before applying any prescription medications to the skin.     CHG Compatible Lotions   Aveeno Moisturizing lotion  Cetaphil Moisturizing Cream  Cetaphil Moisturizing Lotion  Clairol Herbal Essence Moisturizing Lotion, Dry Skin  Clairol Herbal Essence Moisturizing Lotion, Extra Dry Skin  Clairol Herbal Essence Moisturizing Lotion, Normal Skin  Curel Age Defying Therapeutic Moisturizing Lotion with Alpha Hydroxy  Curel Extreme Care Body Lotion  Curel Soothing Hands Moisturizing Hand Lotion  Curel Therapeutic Moisturizing Cream, Fragrance-Free  Curel Therapeutic Moisturizing Lotion, Fragrance-Free  Curel Therapeutic Moisturizing Lotion, Original Formula  Eucerin Daily Replenishing  Lotion  Eucerin Dry Skin Therapy Plus Alpha Hydroxy Crme  Eucerin Dry Skin Therapy Plus Alpha Hydroxy Lotion  Eucerin Original Crme  Eucerin Original Lotion  Eucerin Plus Crme Eucerin Plus Lotion  Eucerin TriLipid Replenishing Lotion  Keri Anti-Bacterial Hand Lotion  Keri Deep Conditioning Original Lotion Dry Skin Formula Softly Scented  Keri Deep Conditioning Original Lotion, Fragrance Free Sensitive Skin Formula  Keri Lotion Fast Absorbing Fragrance Free Sensitive Skin Formula  Keri Lotion Fast Absorbing Softly Scented Dry Skin Formula  Keri Original Lotion  Keri Skin Renewal Lotion Keri Silky Smooth Lotion  Keri Silky Smooth Sensitive Skin Lotion  Nivea Body Creamy Conditioning Oil  Nivea Body Extra Enriched Teacher, adult education Moisturizing Lotion Nivea Crme  Nivea Skin Firming Lotion  NutraDerm 30 Skin Lotion  NutraDerm Skin Lotion  NutraDerm Therapeutic Skin Cream  NutraDerm Therapeutic Skin Lotion  ProShield Protective Hand Cream  Provon moisturizing lotion  Please read over the following fact sheets that you were given.

## 2023-03-23 ENCOUNTER — Encounter: Payer: Self-pay | Admitting: Cardiology

## 2023-03-23 DIAGNOSIS — E876 Hypokalemia: Secondary | ICD-10-CM

## 2023-03-23 NOTE — Progress Notes (Addendum)
Anesthesia Chart Review:  Case: [id number] Date/Time: 04/04/23 0945   Procedure: PLIF - L4-L5 - Posterior Lateral and Interbody fusion (Back)   Anesthesia type: General   Pre-op diagnosis: Spondylolisthesis   Location: [facility] ROOM 19 / [facility]   Surgeons: Arman Bogus, MD       DISCUSSION: Patient is a 62 year old female scheduled for the above procedure.   History includes smoking, COPD, multiple sclerosis, chronic fatigue, HTN, prolonged QT interval, MVP (normal MV structure with no MR or MS by 01/2019 echo), CAD (DES mLAD, DES mRCA 06/29/14), pre-diabetes, fibromyalgia, depression, hysterectomy, spinal surgery (C5-7 ACDF 02/09/05; C4-5 ACDF, removal of hardware C5-7 10/06/09).  Last visit with neurology was on 01/29/23 with Margie Ege, NP. MRI of the brain in April 2024 showed overall stable MS consistent white matter disease. On Ocrevus. No vision changes. Known urinary urgency/incontinence which is followed by urology. Known gait issues (reportedly due to balance and some LLE since her MS diagnosis). She had also had some leg numbness and back pain. She continued to struggle with fatigue. She had been referred for sleep evaluation with Dr. Huston Foley, but she canceled twice. Given worsening low back pain, MRI L-spine ordered as well as referral to PT.   Preoperative labs showed K 2.9 and WBC 19.0, H/H 15.3/44.9, PLT 513. I called and spoke with her. She reported just completing her 3rd Medrol Dosepak. She denied known infection. She has a chronic "smoker's cough", but otherwise denied new cough, fever, dysuria, diarrhea. In regards to her low potassium, she is taking Lasix 20 mg BID and KCl daily and potassium has been normal on the regimen in the past. Recent Medrol Dosepaks may have contributed. She said that Dr. Jens Som manages her Lasix and KCl, so she will reach out to his office for recommendations. If she does not hear back from them by 03/23/23, I told her to go ahead and take an extra  KCl 10 mEq on 03/23/23, 03/24/23, and 03/25/23 and follow-up with them again on 03/26/23 and inquire about getting repeat labs then versus on the day of surgery..    She is scheduled to go to Dr. Ludwig Clarks office on 03/26/23 to get a LLE Venous US as she has been getting intermittent LLE edema that seem to have some correlation to her pain flares. She said that although she has had pain related to MS, she awoke on 02/06/23 with what seemed like more acute onset severe left sciatic pain.  She had an episode in the past that improved. Her current pain has not improved. She cannot stay standing or sitting for very long. Pain is worse in the left hip and LLE but effects both. Sometimes she feels like a knife is stabbing into her left buttocks. Her legs feel like they may give out with standing. Walking has become very painful, and is using assistive device and has even crawled in her house sometimes. She was seen in the ED for this on 03/14/23. The Toradol helped make her pain tolerable for about 5 hours. She said that although her legs can feel numb (intermittently for a year) and her left leg has felt intermittently cool (for > 1 month), she said this in ongoing and denied circulation issue and has not noticed any cyanosis or wound issues ("look normal"). She tells me she has notified Dr. Yetta Barre' staff of her worsening pain and has been prescribed different pain medicaitons and steroids that aren't helping much at this point.  She is anxious to get her surgery done. Her 47 year old son with cerebral palsy lives with her. He uses a motorized wheelchair but has been able to help her some at home.   Last cardiology visit with Dr. Jens Som was on 02/21/23 for follow-up CAD, prolonged QT, chronic diastolic CHF, HTN, HLD. No current chest pain, continue ASA and statin recommended. HTN overall controlled. Euvolemic. No syncope, avoid QT prolonging medications. Smoking cessation encouraged. She notified him that she may  need upcoming back surgery. He wrote, "Prior to her recent back pain she was able to walk at least 1 block with no chest pain or dyspnea. She may proceed without further cardiac evaluation."  As above, she is scheduled for a LE venous US on 03/26/23 to rule out DVT given intermittent LLE swelling.  I think her leukocytosis is likely related to recent Medrol Dosepak as she denied any known infectious symptoms. I have updated Erie Noe at Dr. Yetta Barre' office regarding labs and pending [place]. Dr. Yetta Barre wants repeat CBC on the day of surgery.  If she does not get potassium rechecked at Dr. Ludwig Clarks office then we can repeat a BMP on the day of surgery as well.   ADDENDUM 04/03/23 9:29 AM:  03/26/23 LE Venous US negative for DVT.  She went back to ED on 03/25/23 for back pain. BMP checked and showed further decreased in potassium to 2.7 despite taking extra KCl at home.  She was given 40 mEq KCL in the ED. She also followed up with Dr. Jens Som for recommendations. He said to take another 40 mEq KCl on 03/26/23 and then take 20 mEq daily. Ideally, he wanted her to get BMP rechecked prior to surgery. She had a repeat BMET on 04/02/23 which showed an acceptable potassium level for OR at 3.3.   Per Dr. Yetta Barre, she is to get a repeat CBC on arrival. Anesthesia team to evaluate on the day of surgery.   VS: BP 134/81   Pulse 77   Temp 36.7 C (Oral)   Resp 18   Ht 5\' 2"  (1.575 m)   Wt 77.6 kg   SpO2 98%   BMI 31.28 kg/m    PROVIDERS: Henri Medal, MD is PCP  Olga Millers, MD is cardiologist Joycelyn Schmid, MD is neurologist. She primarily sees Margie Ege, NP.   LABS: See DISCUSSION.  (all labs ordered are listed, but only abnormal results are displayed)  Labs Reviewed  BASIC METABOLIC PANEL - Abnormal; Notable for the following components:      Result Value   Potassium 2.9 (*)    Creatinine, Ser 1.09 (*)    GFR, Estimated 58 (*)    All other components within normal limits  CBC -  Abnormal; Notable for the following components:   WBC 19.0 (*)    Hemoglobin 15.3 (*)    Platelets 513 (*)    All other components within normal limits  SURGICAL PCR SCREEN  PROTIME-INR  TYPE AND SCREEN   Repeat BMET results from 04/02/23 include: Lab Results  Component Value Date   GLUCOSE 108 (H) 04/02/2023   NA 141 04/02/2023   K 3.3 (L) 04/02/2023   CL 102 04/02/2023   CREATININE 0.85 04/02/2023   BUN 10 04/02/2023   CO2 22 04/02/2023     IMAGES: MRI L-spine 03/01/23 ([facility]/P[facility]): IMPRESSION: 1. Multilevel lumbar spondylosis, most pronounced at the L4-5 and L5-S1 levels. 2. Moderate bilateral foraminal stenosis at L4-5 and moderate right foraminal stenosis at L5-S1. 3. No  canal stenosis at any level.  MRI Brain 08/09/22: IMPRESSION: Unchanged white matter disease consistent with multiple sclerosis. No evidence of active demyelination.  CT Chest LCS 06/29/22 ([facility]): IMPRESSION:  1. Lung-RADS 2, benign appearance or behavior. Continue annual  screening with low-dose chest CT without contrast in 12 months.  2. Aortic atherosclerosis (ICD10-I70.0). Coronary artery  calcification.  3. Emphysema (ICD10-J43.9).    EKG: 02/21/23: Normal sinus rhythm Low voltage QRS Nonspecific ST and T wave abnormality When compared with ECG of 09-Aug-2021 13:46, PREVIOUS ECG IS PRESENT Confirmed by Olga Millers ([id number]) on 02/21/2023 2:01:49 PM - Baseline wanderer in V4-6   CV: LE Venous US 03/26/23: Summary:  RIGHT:  - No evidence of common femoral vein obstruction.  LEFT:  - No evidence of deep vein thrombosis in the lower extremity. No indirect  evidence of obstruction proximal to the inguinal ligament.  - No cystic structure found in the popliteal fossa.     Echo 01/15/19: IMPRESSIONS   1. The left ventricle has normal systolic function, with an ejection  fraction of 55-60%. The cavity size was normal. There is mild concentric  left ventricular hypertrophy.  Left ventricular diastolic Doppler  parameters are consistent with impaired  relaxation. No evidence of left ventricular regional wall motion  abnormalities.   2. The right ventricle has normal systolic function. The cavity was  normal. There is no increase in right ventricular wall thickness.   3. The aortic root and ascending aorta are normal in size and structure.   4. No evidence of mitral valve stenosis.   5. No stenosis of the aortic valve.   6. Trivial pericardial effusion is present.   7. The pericardial effusion is posterior to the left ventricle.    LHC/PCI 06/29/14: Coronary angiography: Coronary dominance: right - Left mainstem: Normal. - Left anterior descending (LAD): There is a long 80% stenosis in the mid vessel. The first diagonal is without significant disease.  - Left circumflex (LCx): Normal.  - Right coronary artery (RCA): The RCA has a segmental 60-70% stenosis in the mid vessel.  - Left ventriculography: Left ventricular systolic function is normal, LVEF is estimated at 55-65%, there is no significant mitral regurgitation  - PCI: DES mid LAD, DES mid RCA.  Final Conclusions:   1. 2 vessel obstructive CAD 2. Normal LV function.  Recommendations:  DAPT for one year. Risk factor modification.    Nuclear stress test 06/03/14: Overall Impression:   - Low risk stress nuclear study demonstrating probable mild breast attenuation but with mild mid anteroseptal ischemia. - LV Wall Motion:  NL LV Function, EF 56%; NL Wall Motion   Past Medical History:  Diagnosis Date   Anxiety    Arthritis    CAD (coronary artery disease)    a. 06/2014 abnl MV;  b. 06/2014 Cath: LM nl, LAD 15m (2.75x28 Promus DES), LCX nl, RCA 60-69m (FFR = 0.80 -> 3.0x24 Promus DES), EF 55-65%.   Carpal tunnel syndrome    Cervical dysplasia    Chronic fatigue    COPD (chronic obstructive pulmonary disease) (HCC)    Depression    Family history of adverse reaction to anesthesia    son - n/v    Fibromyalgia    GERD (gastroesophageal reflux disease)    Hypertension    Mitral valve prolapse    Multiple sclerosis (HCC)    Pneumonia    Pre-diabetes    Prolonged QT interval    RLS (restless legs syndrome) 04/28/2015  Past Surgical History:  Procedure Laterality Date   ABDOMINAL HYSTERECTOMY     BICEPS TENDON REPAIR     bladder resuspension     CARPAL TUNNEL RELEASE     CERVICAL SPINE SURGERY  10-16-2009   CORONARY ANGIOPLASTY WITH STENT PLACEMENT     DILATION AND CURETTAGE OF UTERUS     FOOT SURGERY Bilateral    For bunions   HAMMER TOE SURGERY Left    LEFT HEART CATHETERIZATION WITH CORONARY ANGIOGRAM N/A 06/29/2014   Procedure: LEFT HEART CATHETERIZATION WITH CORONARY ANGIOGRAM;  Surgeon: Micheline Chapman, MD;  Location: [facility] CATH LAB;  Service: Cardiovascular;  Laterality: N/A;   ORIF TOE FRACTURE Right    Prolong QTC     Heart   rectovaginal fistula repair     ROTATOR CUFF REPAIR Left     MEDICATIONS:  albuterol (VENTOLIN HFA) 108 (90 Base) MCG/ACT inhaler   amLODipine (NORVASC) 5 MG tablet   amoxicillin (AMOXIL) 500 MG capsule   aspirin 81 MG chewable tablet   atorvastatin (LIPITOR) 80 MG tablet   Azelastine HCl 137 MCG/SPRAY SOLN   Cholecalciferol (VITAMIN D-3) 125 MCG (5000 UT) TABS   ciprofloxacin (CIPRO) 500 MG tablet   diphenhydrAMINE (BENADRYL) 50 MG capsule   DULoxetine (CYMBALTA) 60 MG capsule   esomeprazole (NEXIUM) 40 MG capsule   ezetimibe (ZETIA) 10 MG tablet   ferrous gluconate (FERGON) 324 MG tablet   furosemide (LASIX) 20 MG tablet   gabapentin (NEURONTIN) 100 MG capsule   ibuprofen (ADVIL) 600 MG tablet   linaclotide (LINZESS) 145 MCG CAPS capsule   losartan (COZAAR) 100 MG tablet   MEDROL 4 MG TBPK tablet   methylphenidate (RITALIN) 20 MG tablet   metoprolol succinate (TOPROL-XL) 50 MG 24 hr tablet   mirabegron ER (MYRBETRIQ) 50 MG TB24 tablet   NASONEX 50 MCG/ACT nasal spray   ocrelizumab 600 mg in sodium chloride 0.9 % 500 mL    potassium chloride (KLOR-CON) 10 MEQ tablet   pramipexole (MIRAPEX) 0.25 MG tablet   No current facility-administered medications for this encounter.    Shonna Chock, PA-C Surgical Short Stay/Anesthesiology [facility] Phone [phone] [facility] Phone [phone] 03/23/2023 4:15 PM

## 2023-03-25 ENCOUNTER — Other Ambulatory Visit: Payer: Self-pay

## 2023-03-25 ENCOUNTER — Encounter (HOSPITAL_BASED_OUTPATIENT_CLINIC_OR_DEPARTMENT_OTHER): Payer: Self-pay | Admitting: Emergency Medicine

## 2023-03-25 ENCOUNTER — Emergency Department (HOSPITAL_BASED_OUTPATIENT_CLINIC_OR_DEPARTMENT_OTHER): Payer: Medicare PPO

## 2023-03-25 ENCOUNTER — Emergency Department (HOSPITAL_BASED_OUTPATIENT_CLINIC_OR_DEPARTMENT_OTHER)
Admission: EM | Admit: 2023-03-25 | Discharge: 2023-03-26 | Disposition: A | Discharge status: Home / Self Care | Payer: Medicare PPO | Attending: Emergency Medicine | Admitting: Emergency Medicine

## 2023-03-25 DIAGNOSIS — I2510 Atherosclerotic heart disease of native coronary artery without angina pectoris: Secondary | ICD-10-CM | POA: Diagnosis not present

## 2023-03-25 DIAGNOSIS — E876 Hypokalemia: Secondary | ICD-10-CM | POA: Diagnosis not present

## 2023-03-25 DIAGNOSIS — M5442 Lumbago with sciatica, left side: Secondary | ICD-10-CM | POA: Insufficient documentation

## 2023-03-25 DIAGNOSIS — J449 Chronic obstructive pulmonary disease, unspecified: Secondary | ICD-10-CM | POA: Insufficient documentation

## 2023-03-25 DIAGNOSIS — I10 Essential (primary) hypertension: Secondary | ICD-10-CM | POA: Diagnosis not present

## 2023-03-25 DIAGNOSIS — M7989 Other specified soft tissue disorders: Secondary | ICD-10-CM | POA: Insufficient documentation

## 2023-03-25 DIAGNOSIS — F17210 Nicotine dependence, cigarettes, uncomplicated: Secondary | ICD-10-CM | POA: Diagnosis not present

## 2023-03-25 DIAGNOSIS — M545 Low back pain, unspecified: Secondary | ICD-10-CM | POA: Diagnosis present

## 2023-03-25 LAB — CBC
HCT: 42.6 % (ref 36.0–46.0)
Hemoglobin: 15 g/dL (ref 12.0–15.0)
MCH: 31.4 pg (ref 26.0–34.0)
MCHC: 35.2 g/dL (ref 30.0–36.0)
MCV: 89.3 fL (ref 80.0–100.0)
Platelets: 477 10*3/uL — ABNORMAL HIGH (ref 150–400)
RBC: 4.77 MIL/uL (ref 3.87–5.11)
RDW: 13.7 % (ref 11.5–15.5)
WBC: 14.1 10*3/uL — ABNORMAL HIGH (ref 4.0–10.5)
nRBC: 0 % (ref 0.0–0.2)

## 2023-03-25 MED ORDER — HYDROMORPHONE HCL 1 MG/ML IJ SOLN
1.0000 mg | Freq: Once | INTRAMUSCULAR | Status: AC
  Administered 2023-03-25: 1 mg via INTRAVENOUS
  Filled 2023-03-25: qty 1

## 2023-03-25 NOTE — ED Triage Notes (Addendum)
Pt reports lower back pain that is chronic, but she felt something pop in her back earlier today and the pain is worse; radiates down LLE; sts she is having difficulty ambulating d/t pain

## 2023-03-25 NOTE — ED Provider Notes (Signed)
[facility]Y DEPT [facility] Emergency Department Provider Note MRN:  [id number]  Arrival date & time: 03/26/23     Chief Complaint   Back Pain   History of Present Illness   Summer Henderson is a 62 y.o. year-old female with a history of CAD, degenerative disc disease presenting to the ED with chief complaint of back pain.  Left lower back pain since late September.  Scheduled for lumbar fusion surgery later this month.  Moved today and felt something pop in her back.  Pain severely worse.  No changes to sensation or strength to legs, no issues with urination or bowel movements today.    Review of Systems  A thorough review of systems was obtained and all systems are negative except as noted in the HPI and PMH.   Patient's Health History    Past Medical History:  Diagnosis Date   Anxiety    Arthritis    CAD (coronary artery disease)    a. 06/2014 abnl MV;  b. 06/2014 Cath: LM nl, LAD 28m (2.75x28 Promus DES), LCX nl, RCA 60-56m (FFR = 0.80 -> 3.0x24 Promus DES), EF 55-65%.   Carpal tunnel syndrome    Cervical dysplasia    Chronic fatigue    COPD (chronic obstructive pulmonary disease) (HCC)    Depression    Family history of adverse reaction to anesthesia    son - n/v   Fibromyalgia    GERD (gastroesophageal reflux disease)    Hypertension    Mitral valve prolapse    Multiple sclerosis (HCC)    Pneumonia    Pre-diabetes    Prolonged QT interval    RLS (restless legs syndrome) 04/28/2015    Past Surgical History:  Procedure Laterality Date   ABDOMINAL HYSTERECTOMY     BICEPS TENDON REPAIR     bladder resuspension     CARPAL TUNNEL RELEASE     CERVICAL SPINE SURGERY  10-16-2009   CORONARY ANGIOPLASTY WITH STENT PLACEMENT     DILATION AND CURETTAGE OF UTERUS     FOOT SURGERY Bilateral    For bunions   HAMMER TOE SURGERY Left    LEFT HEART CATHETERIZATION WITH CORONARY ANGIOGRAM N/A 06/29/2014   Procedure: LEFT HEART CATHETERIZATION WITH CORONARY ANGIOGRAM;   Surgeon: Micheline Chapman, MD;  Location: [facility] CATH LAB;  Service: Cardiovascular;  Laterality: N/A;   ORIF TOE FRACTURE Right    Prolong QTC     Heart   rectovaginal fistula repair     ROTATOR CUFF REPAIR Left     Family History  Problem Relation Age of Onset   Melanoma Mother    Colon polyps Mother    Diabetes Mother    Breast cancer Maternal Grandmother    Colon cancer Maternal Grandmother    Brain cancer Maternal Grandfather    Lung cancer Maternal Grandfather    Diabetes Maternal Grandfather    Kidney disease Neg Hx    Esophageal cancer Neg Hx    Gallbladder disease Neg Hx     Social History   Socioeconomic History   Marital status: Divorced    Spouse name: Not on file   Number of children: 2   Years of education: 13   Highest education level: Not on file  Occupational History   Occupation: Disabled    Comment: Disability  Tobacco Use   Smoking status: Every Day    Current packs/day: 1.00    Average packs/day: 1 pack/day for 35.0 years (35.0 ttl pk-yrs)  Types: Cigarettes   Smokeless tobacco: Never  Vaping Use   Vaping status: Never Used  Substance and Sexual Activity   Alcohol use: No    Alcohol/week: 0.0 standard drinks of alcohol   Drug use: No   Sexual activity: Not on file  Other Topics Concern   Not on file  Social History Narrative   Patient lives with son   Patient does not drink caffeine.   Patient is right handed.   Social Determinants of Health   Financial Resource Strain: Unknown (04/26/2020)   Received from [facility] visits prior to 07/08/2022., [facility] visits prior to 07/08/2022.   Overall Financial Resource Strain (CARDIA)    Difficulty of Paying Living Expenses: Patient refused  Food Insecurity: Low Risk  (08/31/2022)   [facility]   Hunger Vital Sign    Worried About Running Out of Food in the Last Year: Never true    Ran Out of Food in the Last Year: Never  true  Transportation Needs: No Transportation Needs (08/31/2022)   [facility]   Transportation    In the past 12 months, has lack of reliable transportation kept you from medical appointments, meetings, work or from getting things needed for daily living? : No  Physical Activity: Unknown (04/26/2020)   Received [facility] visits prior to 07/08/2022., [facility] visits prior to 07/08/2022.   Exercise Vital Sign    Days of Exercise per Week: 0 days    Minutes of Exercise per Session: Not on file  Stress: Stress Concern Present (04/26/2020)   [facility] prior to 07/08/2022., [facility] visits prior to 07/08/2022.   [employer] of Occupational Health - Occupational Stress Questionnaire    Feeling of Stress : Rather much  Social Connections: Socially Isolated (04/26/2020)   [facility] visits prior to 07/08/2022., [facility] visits prior to 07/08/2022.   Social Advertising account executive [NHANES]    Frequency of Communication with Friends and Family: Never    Frequency of Social Gatherings with Friends and Family: Never    Attends Religious Services: More than 4 times per year    Active Member of Clubs or Organizations: No    Attends Banker Meetings: Patient refused    Marital Status: Divorced  Catering manager Violence: Not At Risk (04/26/2020)   [facility] prior to 07/08/2022., [facility] visits prior to 07/08/2022.   Humiliation, Afraid, Rape, and Kick questionnaire    Fear of Current or Ex-Partner: No    Emotionally Abused: No    Physically Abused: No    Sexually Abused: No     Physical Exam   Vitals:   03/26/23 0045 03/26/23 0100  BP: 101/84 125/83  Pulse: 67 75  Resp: (!) 24 13  Temp:    SpO2: 94%  97%    CONSTITUTIONAL:  well-appearing, NAD NEURO/PSYCH:  Alert and oriented x 3, symmetric sensation, strength in legs seems limited by pain EYES:  eyes equal and reactive ENT/NECK:  no LAD, no JVD CARDIO:  regular rate, well-perfused, normal S1 and S2 PULM:  CTAB no wheezing or rhonchi GI/GU:  non-distended, non-tender MSK/SPINE:  No gross deformities, no edema SKIN:  no rash, atraumatic   *Additional and/or pertinent  findings included in MDM below  Diagnostic and Interventional Summary    EKG Interpretation Date/Time:    Ventricular Rate:    PR Interval:    QRS Duration:    QT Interval:    QTC Calculation:   R Axis:      Text Interpretation:         Labs Reviewed  CBC - Abnormal; Notable for the following components:      Result Value   WBC 14.1 (*)    Platelets 477 (*)    All other components within normal limits  BASIC METABOLIC PANEL - Abnormal; Notable for the following components:   Potassium 2.7 (*)    All other components within normal limits    CT Lumbar Spine Wo Contrast  Final Result      Medications  HYDROmorphone (DILAUDID) injection 1 mg (1 mg Intravenous Given 03/25/23 2336)  potassium chloride SA (KLOR-CON M) CR tablet 40 mEq (40 mEq Oral Given 03/26/23 0019)  HYDROmorphone (DILAUDID) injection 1 mg (1 mg Intravenous Given 03/26/23 0020)     Procedures  /  Critical Care Procedures  ED Course and Medical Decision Making  Initial Impression and Ddx Known disc herniation and stenosis of lumbar spine.  Acute change today.  Question compression fracture. No obvious myelopathy at this time.  Past medical/surgical history that increases complexity of ED encounter: MS  Interpretation of Diagnostics I personally reviewed the laboratory assessment and my interpretation is as follows: Hypokalemia otherwise no significant blood count or electrolyte disturbance  CT without acute fracture.  Patient Reassessment and Ultimate Disposition/Management      Patient's pain is better controlled, continued reassuring exam without indication for MRI or admission, appropriate for discharge.  Patient management required discussion with the following services or consulting groups:  None  Complexity of Problems Addressed Acute illness or injury that poses threat of life of bodily function  Additional Data Reviewed and Analyzed Further history obtained from: Prior labs/imaging results  Additional Factors Impacting ED Encounter Risk Use of parenteral controlled substances and Consideration of hospitalization  Elmer Sow. Pilar Plate, MD [facility] [email or web address]  Final Clinical Impressions(s) / ED Diagnoses     ICD-10-CM   1. Acute left-sided low back pain with left-sided sciatica  M54.42       ED Discharge Orders     None        Discharge Instructions Discussed with and Provided to Patient:     Discharge Instructions      You were evaluated in the Emergency Department and after careful evaluation, we did not find any emergent condition requiring admission or further testing in the hospital.  Your exam/testing today is overall reassuring.  Follow-up with your neurosurgeon, take your home pain medications.  Please return to the Emergency Department if you experience any worsening of your condition.   Thank you for allowing [place] to be a part of your care.       Sabas Sous, MD 03/26/23 [phone]

## 2023-03-26 ENCOUNTER — Encounter: Payer: Self-pay | Admitting: Cardiology

## 2023-03-26 ENCOUNTER — Ambulatory Visit (INDEPENDENT_AMBULATORY_CARE_PROVIDER_SITE_OTHER)
Admission: RE | Admit: 2023-03-26 | Discharge: 2023-03-26 | Disposition: A | Discharge status: Home / Self Care | Payer: Medicare PPO | Source: Ambulatory Visit | Attending: Cardiology | Admitting: Cardiology

## 2023-03-26 DIAGNOSIS — M5442 Lumbago with sciatica, left side: Secondary | ICD-10-CM | POA: Diagnosis not present

## 2023-03-26 DIAGNOSIS — M7989 Other specified soft tissue disorders: Secondary | ICD-10-CM | POA: Insufficient documentation

## 2023-03-26 LAB — BASIC METABOLIC PANEL WITH GFR
Anion gap: 10 (ref 5–15)
BUN: 10 mg/dL (ref 6–20)
CO2: 24 mmol/L (ref 22–32)
Calcium: 8.9 mg/dL (ref 8.9–10.3)
Chloride: 103 mmol/L (ref 98–111)
Creatinine, Ser: 0.92 mg/dL (ref 0.44–1.00)
GFR, Estimated: >60 mL/min (ref >60)
Glucose, Bld: 93 mg/dL (ref 70–99)
Potassium: 2.7 mmol/L — CL (ref 3.5–5.1)
Sodium: 137 mmol/L (ref 135–145)

## 2023-03-26 MED ORDER — POTASSIUM CHLORIDE CRYS ER 20 MEQ PO TBCR
40.0000 meq | EXTENDED_RELEASE_TABLET | Freq: Once | ORAL | Status: AC
  Administered 2023-03-26: 40 meq via ORAL
  Filled 2023-03-26: qty 2

## 2023-03-26 MED ORDER — POTASSIUM CHLORIDE CRYS ER 20 MEQ PO TBCR: 20.0000 meq | EXTENDED_RELEASE_TABLET | Freq: Every day | ORAL | 3 refills | Status: DC

## 2023-03-26 MED ORDER — HYDROMORPHONE HCL 1 MG/ML IJ SOLN
1.0000 mg | Freq: Once | INTRAMUSCULAR | Status: AC
  Administered 2023-03-26: 1 mg via INTRAVENOUS
  Filled 2023-03-26: qty 1

## 2023-03-26 NOTE — ED Notes (Signed)
Pt placed on 2L after O2 sat dropping to 88% after med admin

## 2023-03-26 NOTE — Telephone Encounter (Signed)
Addressed in MyChart encounter from 03/23/23.

## 2023-03-26 NOTE — Telephone Encounter (Signed)
Spoke with patient while she was in the office for testing. Advised of Dr. Ludwig Clarks recommendations. Pt agreeable to plan and denies any questions.

## 2023-03-26 NOTE — Discharge Instructions (Signed)
You were evaluated in the Emergency Department and after careful evaluation, we did not find any emergent condition requiring admission or further testing in the hospital.  Your exam/testing today is overall reassuring.  Follow-up with your neurosurgeon, take your home pain medications.  Please return to the Emergency Department if you experience any worsening of your condition.   Thank you for allowing [place] to be a part of your care.

## 2023-03-28 ENCOUNTER — Other Ambulatory Visit: Payer: Self-pay | Admitting: Cardiology

## 2023-03-28 DIAGNOSIS — R601 Generalized edema: Secondary | ICD-10-CM

## 2023-03-29 ENCOUNTER — Encounter: Payer: Self-pay | Admitting: *Deleted

## 2023-03-30 NOTE — Anesthesia Preprocedure Evaluation (Addendum)
Anesthesia Evaluation  Patient identified by MRN, date of birth, ID band Patient awake    Reviewed: Allergy & Precautions, NPO status , Patient's Chart, lab work & pertinent test results, reviewed documented beta blocker date and time   Airway Mallampati: II  TM Distance: >3 FB Neck ROM: Full    Dental  (+) Dental Advisory Given, Edentulous Upper   Pulmonary COPD,  COPD inhaler, Current SmokerPatient did not abstain from smoking. hypermetabolic LUL lung nodule    Pulmonary exam normal breath sounds clear to auscultation       Cardiovascular hypertension, Pt. on home beta blockers and Pt. on medications + CAD and + Past MI  Normal cardiovascular exam Rhythm:Regular Rate:Normal     Neuro/Psych negative neurological ROS  negative psych ROS   GI/Hepatic negative GI ROS, Neg liver ROS,,,  Endo/Other  negative endocrine ROS    Renal/GU negative Renal ROS     Musculoskeletal  (+) Arthritis ,    Abdominal   Peds  Hematology negative hematology ROS (+)   Anesthesia Other Findings Day of surgery medications reviewed with the patient.  Reproductive/Obstetrics                             Anesthesia Physical Anesthesia Plan  ASA: 3  Anesthesia Plan: General   Post-op Pain Management: Tylenol PO (pre-op)*   Induction: Intravenous  PONV Risk Score and Plan: 1 and Dexamethasone and Ondansetron  Airway Management Planned: Oral ETT  Additional Equipment:   Intra-op Plan:   Post-operative Plan: Extubation in OR  Informed Consent: I have reviewed the patients History and Physical, chart, labs and discussed the procedure including the risks, benefits and alternatives for the proposed anesthesia with the patient or authorized representative who has indicated his/her understanding and acceptance.     Dental advisory given  Plan Discussed with: CRNA  Anesthesia Plan Comments: (PAT note  written by Shonna Chock, PA-C.  )       Anesthesia Quick Evaluation

## 2023-04-02 ENCOUNTER — Other Ambulatory Visit: Payer: Self-pay

## 2023-04-02 DIAGNOSIS — E876 Hypokalemia: Secondary | ICD-10-CM

## 2023-04-03 ENCOUNTER — Telehealth: Payer: Self-pay | Admitting: *Deleted

## 2023-04-03 DIAGNOSIS — E876 Hypokalemia: Secondary | ICD-10-CM

## 2023-04-03 LAB — BASIC METABOLIC PANEL
BUN/Creatinine Ratio: 12 (ref 12–28)
BUN: 10 mg/dL (ref 8–27)
CO2: 22 mmol/L (ref 20–29)
Calcium: 9.1 mg/dL (ref 8.7–10.3)
Chloride: 102 mmol/L (ref 96–106)
Creatinine, Ser: 0.85 mg/dL (ref 0.57–1.00)
Glucose: 108 mg/dL — ABNORMAL HIGH (ref 70–99)
Potassium: 3.3 mmol/L — ABNORMAL LOW (ref 3.5–5.2)
Sodium: 141 mmol/L (ref 134–144)
eGFR: 78 mL/min/{1.73_m2} (ref >59)

## 2023-04-03 MED ORDER — POTASSIUM CHLORIDE CRYS ER 20 MEQ PO TBCR: 40.0000 meq | EXTENDED_RELEASE_TABLET | Freq: Every day | ORAL | Status: DC

## 2023-04-03 NOTE — Telephone Encounter (Signed)
Spoke with vanessa with dr Yetta Barre office, aware of labs and medication concerns. She reports she will call the patient and she will be able to take the potassium tomorrow.

## 2023-04-03 NOTE — Telephone Encounter (Signed)
Spoke with pt, she is aware of results. She reports she is having surgery tomorrow and potassium is not listed as a medication she can take prior to surgery. Patient voiced understanding to take an extra 20 meq of potassium when she gets home today. Left a message for Dr Marikay Alar' office to call to discuss labs and potassium.

## 2023-04-03 NOTE — Telephone Encounter (Signed)
-----   Message from Olga Millers sent at 04/03/2023  7:14 AM EST ----- Increase KCl to 40 meq daily; bmet one week Olga Millers

## 2023-04-04 ENCOUNTER — Observation Stay (HOSPITAL_COMMUNITY)
Admission: RE | Admit: 2023-04-04 | Discharge: 2023-04-05 | Disposition: A | Discharge status: Home / Self Care | Payer: Medicare PPO | Attending: Neurological Surgery | Admitting: Neurological Surgery

## 2023-04-04 ENCOUNTER — Other Ambulatory Visit: Payer: Self-pay | Admitting: *Deleted

## 2023-04-04 ENCOUNTER — Other Ambulatory Visit: Payer: Self-pay

## 2023-04-04 ENCOUNTER — Ambulatory Visit (HOSPITAL_BASED_OUTPATIENT_CLINIC_OR_DEPARTMENT_OTHER): Payer: Self-pay

## 2023-04-04 ENCOUNTER — Encounter (HOSPITAL_COMMUNITY): Payer: Self-pay | Admitting: Neurological Surgery

## 2023-04-04 ENCOUNTER — Ambulatory Visit (HOSPITAL_COMMUNITY): Payer: Medicare PPO | Admitting: Vascular Surgery

## 2023-04-04 ENCOUNTER — Ambulatory Visit (HOSPITAL_COMMUNITY): Payer: Medicare PPO

## 2023-04-04 ENCOUNTER — Encounter (HOSPITAL_COMMUNITY)
Admission: RE | Disposition: A | Discharge status: Home / Self Care | Payer: Self-pay | Source: Home / Self Care | Attending: Neurological Surgery

## 2023-04-04 DIAGNOSIS — F17210 Nicotine dependence, cigarettes, uncomplicated: Secondary | ICD-10-CM | POA: Diagnosis not present

## 2023-04-04 DIAGNOSIS — E876 Hypokalemia: Secondary | ICD-10-CM

## 2023-04-04 DIAGNOSIS — Z01818 Encounter for other preprocedural examination: Secondary | ICD-10-CM

## 2023-04-04 DIAGNOSIS — J449 Chronic obstructive pulmonary disease, unspecified: Secondary | ICD-10-CM | POA: Insufficient documentation

## 2023-04-04 DIAGNOSIS — Z955 Presence of coronary angioplasty implant and graft: Secondary | ICD-10-CM | POA: Insufficient documentation

## 2023-04-04 DIAGNOSIS — I2510 Atherosclerotic heart disease of native coronary artery without angina pectoris: Secondary | ICD-10-CM | POA: Insufficient documentation

## 2023-04-04 DIAGNOSIS — M4316 Spondylolisthesis, lumbar region: Secondary | ICD-10-CM | POA: Diagnosis not present

## 2023-04-04 DIAGNOSIS — Z7982 Long term (current) use of aspirin: Secondary | ICD-10-CM | POA: Insufficient documentation

## 2023-04-04 DIAGNOSIS — M5416 Radiculopathy, lumbar region: Secondary | ICD-10-CM | POA: Insufficient documentation

## 2023-04-04 DIAGNOSIS — I10 Essential (primary) hypertension: Secondary | ICD-10-CM | POA: Diagnosis not present

## 2023-04-04 DIAGNOSIS — M48061 Spinal stenosis, lumbar region without neurogenic claudication: Secondary | ICD-10-CM | POA: Diagnosis not present

## 2023-04-04 DIAGNOSIS — Z981 Arthrodesis status: Principal | ICD-10-CM

## 2023-04-04 DIAGNOSIS — Z79899 Other long term (current) drug therapy: Secondary | ICD-10-CM | POA: Insufficient documentation

## 2023-04-04 LAB — ABO/RH: ABO/RH(D): O POS

## 2023-04-04 LAB — GLUCOSE, CAPILLARY: Glucose-Capillary: 238 mg/dL — ABNORMAL HIGH (ref 70–99)

## 2023-04-04 SURGERY — POSTERIOR LUMBAR FUSION 1 LEVEL
Anesthesia: General | Site: Back

## 2023-04-04 MED ORDER — GABAPENTIN 300 MG PO CAPS
ORAL_CAPSULE | ORAL | Status: AC
  Filled 2023-04-04: qty 1

## 2023-04-04 MED ORDER — LOSARTAN POTASSIUM 50 MG PO TABS
100.0000 mg | ORAL_TABLET | Freq: Every day | ORAL | Status: DC
  Administered 2023-04-04 – 2023-04-05 (×2): 100 mg via ORAL
  Filled 2023-04-04 (×2): qty 2

## 2023-04-04 MED ORDER — BUPIVACAINE HCL (PF) 0.25 % IJ SOLN
INTRAMUSCULAR | Status: DC | PRN
  Administered 2023-04-04: 6 mL

## 2023-04-04 MED ORDER — METHOCARBAMOL 1000 MG/10ML IJ SOLN
500.0000 mg | Freq: Four times a day (QID) | INTRAMUSCULAR | Status: DC | PRN
  Administered 2023-04-04: 500 mg via INTRAVENOUS
  Filled 2023-04-04: qty 10

## 2023-04-04 MED ORDER — BUPIVACAINE HCL (PF) 0.25 % IJ SOLN
INTRAMUSCULAR | Status: AC
  Filled 2023-04-04: qty 30

## 2023-04-04 MED ORDER — POTASSIUM CHLORIDE CRYS ER 20 MEQ PO TBCR
40.0000 meq | EXTENDED_RELEASE_TABLET | Freq: Every day | ORAL | Status: DC
  Administered 2023-04-04 – 2023-04-05 (×2): 40 meq via ORAL
  Filled 2023-04-04 (×2): qty 2

## 2023-04-04 MED ORDER — ACETAMINOPHEN 500 MG PO TABS
ORAL_TABLET | ORAL | Status: AC
  Administered 2023-04-04: 1000 mg via ORAL
  Filled 2023-04-04: qty 2

## 2023-04-04 MED ORDER — ACETAMINOPHEN 10 MG/ML IV SOLN: 1000.0000 mg | Freq: Once | INTRAVENOUS | Status: DC | PRN

## 2023-04-04 MED ORDER — DULOXETINE HCL 60 MG PO CPEP
60.0000 mg | ORAL_CAPSULE | Freq: Every day | ORAL | Status: DC
  Administered 2023-04-05: 60 mg via ORAL
  Filled 2023-04-04: qty 1

## 2023-04-04 MED ORDER — PHENYLEPHRINE 80 MCG/ML (10ML) SYRINGE FOR IV PUSH (FOR BLOOD PRESSURE SUPPORT)
PREFILLED_SYRINGE | INTRAVENOUS | Status: AC
  Filled 2023-04-04: qty 10

## 2023-04-04 MED ORDER — LIDOCAINE 2% (20 MG/ML) 5 ML SYRINGE
INTRAMUSCULAR | Status: DC | PRN
  Administered 2023-04-04: 100 mg via INTRAVENOUS

## 2023-04-04 MED ORDER — ACETAMINOPHEN 500 MG PO TABS
1000.0000 mg | ORAL_TABLET | Freq: Four times a day (QID) | ORAL | Status: DC
  Administered 2023-04-04 – 2023-04-05 (×3): 1000 mg via ORAL
  Filled 2023-04-04 (×4): qty 2

## 2023-04-04 MED ORDER — CHLORHEXIDINE GLUCONATE CLOTH 2 % EX PADS: 6.0000 | MEDICATED_PAD | Freq: Once | CUTANEOUS | Status: DC

## 2023-04-04 MED ORDER — PHENYLEPHRINE 80 MCG/ML (10ML) SYRINGE FOR IV PUSH (FOR BLOOD PRESSURE SUPPORT)
PREFILLED_SYRINGE | INTRAVENOUS | Status: DC | PRN
  Administered 2023-04-04: 80 ug via INTRAVENOUS

## 2023-04-04 MED ORDER — ALBUTEROL SULFATE HFA 108 (90 BASE) MCG/ACT IN AERS: 2.0000 | INHALATION_SPRAY | Freq: Four times a day (QID) | RESPIRATORY_TRACT | Status: DC | PRN

## 2023-04-04 MED ORDER — PROPOFOL 10 MG/ML IV BOLUS
INTRAVENOUS | Status: DC | PRN
  Administered 2023-04-04: 150 mg via INTRAVENOUS

## 2023-04-04 MED ORDER — SUCCINYLCHOLINE CHLORIDE 200 MG/10ML IV SOSY
PREFILLED_SYRINGE | INTRAVENOUS | Status: AC
  Filled 2023-04-04: qty 10

## 2023-04-04 MED ORDER — SODIUM CHLORIDE 0.9% FLUSH
3.0000 mL | Freq: Two times a day (BID) | INTRAVENOUS | Status: DC
  Administered 2023-04-04 (×2): 3 mL via INTRAVENOUS

## 2023-04-04 MED ORDER — 0.9 % SODIUM CHLORIDE (POUR BTL) OPTIME
TOPICAL | Status: DC | PRN
  Administered 2023-04-04: 1000 mL

## 2023-04-04 MED ORDER — KETAMINE HCL 50 MG/5ML IJ SOSY
PREFILLED_SYRINGE | INTRAMUSCULAR | Status: AC
  Filled 2023-04-04: qty 5

## 2023-04-04 MED ORDER — METHOCARBAMOL 500 MG PO TABS
500.0000 mg | ORAL_TABLET | Freq: Four times a day (QID) | ORAL | Status: DC | PRN
  Administered 2023-04-04 – 2023-04-05 (×2): 500 mg via ORAL
  Filled 2023-04-04 (×2): qty 1

## 2023-04-04 MED ORDER — ORAL CARE MOUTH RINSE: 15.0000 mL | Freq: Once | OROMUCOSAL | Status: AC

## 2023-04-04 MED ORDER — ONDANSETRON HCL 4 MG/2ML IJ SOLN
INTRAMUSCULAR | Status: AC
  Filled 2023-04-04: qty 2

## 2023-04-04 MED ORDER — ROCURONIUM BROMIDE 10 MG/ML (PF) SYRINGE
PREFILLED_SYRINGE | INTRAVENOUS | Status: DC | PRN
  Administered 2023-04-04: 70 mg via INTRAVENOUS
  Administered 2023-04-04: 30 mg via INTRAVENOUS

## 2023-04-04 MED ORDER — HYDROMORPHONE HCL 1 MG/ML IJ SOLN
0.5000 mg | INTRAMUSCULAR | Status: DC | PRN
  Administered 2023-04-05: 0.5 mg via INTRAVENOUS
  Filled 2023-04-04: qty 0.5

## 2023-04-04 MED ORDER — DEXAMETHASONE 4 MG PO TABS
4.0000 mg | ORAL_TABLET | Freq: Four times a day (QID) | ORAL | Status: DC
  Administered 2023-04-05 (×2): 4 mg via ORAL
  Filled 2023-04-04 (×2): qty 1

## 2023-04-04 MED ORDER — CEFAZOLIN SODIUM-DEXTROSE 2-4 GM/100ML-% IV SOLN
2.0000 g | INTRAVENOUS | Status: AC
  Administered 2023-04-04: 2 g via INTRAVENOUS

## 2023-04-04 MED ORDER — NEOSTIGMINE METHYLSULFATE 3 MG/3ML IV SOSY
PREFILLED_SYRINGE | INTRAVENOUS | Status: AC
  Filled 2023-04-04: qty 3

## 2023-04-04 MED ORDER — MENTHOL 3 MG MT LOZG: 1.0000 | LOZENGE | OROMUCOSAL | Status: DC | PRN

## 2023-04-04 MED ORDER — PRAMIPEXOLE DIHYDROCHLORIDE 0.25 MG PO TABS
0.2500 mg | ORAL_TABLET | Freq: Every day | ORAL | Status: DC
  Administered 2023-04-04: 0.25 mg via ORAL
  Filled 2023-04-04: qty 1

## 2023-04-04 MED ORDER — OXYCODONE HCL 5 MG PO TABS
10.0000 mg | ORAL_TABLET | ORAL | Status: DC | PRN
  Administered 2023-04-04 – 2023-04-05 (×3): 10 mg via ORAL
  Filled 2023-04-04 (×3): qty 2

## 2023-04-04 MED ORDER — FENTANYL CITRATE (PF) 100 MCG/2ML IJ SOLN
INTRAMUSCULAR | Status: AC
  Filled 2023-04-04: qty 2

## 2023-04-04 MED ORDER — ASPIRIN 81 MG PO CHEW
81.0000 mg | CHEWABLE_TABLET | Freq: Every day | ORAL | Status: DC
  Administered 2023-04-04 – 2023-04-05 (×2): 81 mg via ORAL
  Filled 2023-04-04 (×2): qty 1

## 2023-04-04 MED ORDER — FENTANYL CITRATE (PF) 250 MCG/5ML IJ SOLN
INTRAMUSCULAR | Status: AC
  Filled 2023-04-04: qty 5

## 2023-04-04 MED ORDER — PANTOPRAZOLE SODIUM 40 MG PO TBEC
40.0000 mg | DELAYED_RELEASE_TABLET | Freq: Every day | ORAL | Status: DC
  Administered 2023-04-05: 40 mg via ORAL
  Filled 2023-04-04: qty 1

## 2023-04-04 MED ORDER — THROMBIN 20000 UNITS EX SOLR: CUTANEOUS | Status: DC | PRN

## 2023-04-04 MED ORDER — DEXAMETHASONE SODIUM PHOSPHATE 10 MG/ML IJ SOLN
INTRAMUSCULAR | Status: DC | PRN
  Administered 2023-04-04: 10 mg via INTRAVENOUS

## 2023-04-04 MED ORDER — SENNA 8.6 MG PO TABS
1.0000 | ORAL_TABLET | Freq: Two times a day (BID) | ORAL | Status: DC
  Administered 2023-04-05: 8.6 mg via ORAL
  Filled 2023-04-04 (×2): qty 1

## 2023-04-04 MED ORDER — THROMBIN 5000 UNITS EX SOLR
CUTANEOUS | Status: AC
  Filled 2023-04-04: qty 5000

## 2023-04-04 MED ORDER — METOPROLOL SUCCINATE ER 50 MG PO TB24
50.0000 mg | ORAL_TABLET | Freq: Every day | ORAL | Status: DC
  Administered 2023-04-04 (×2): 50 mg via ORAL
  Filled 2023-04-04 (×2): qty 1

## 2023-04-04 MED ORDER — SURGIRINSE WOUND IRRIGATION SYSTEM - OPTIME: TOPICAL | Status: DC | PRN

## 2023-04-04 MED ORDER — ACETAMINOPHEN 500 MG PO TABS: 1000.0000 mg | ORAL_TABLET | ORAL | Status: AC

## 2023-04-04 MED ORDER — CEFAZOLIN SODIUM-DEXTROSE 2-4 GM/100ML-% IV SOLN
INTRAVENOUS | Status: AC
  Administered 2023-04-04: 2 g via INTRAVENOUS
  Filled 2023-04-04: qty 100

## 2023-04-04 MED ORDER — THROMBIN 5000 UNITS EX SOLR: OROMUCOSAL | Status: DC | PRN

## 2023-04-04 MED ORDER — FENTANYL CITRATE (PF) 100 MCG/2ML IJ SOLN
25.0000 ug | INTRAMUSCULAR | Status: DC | PRN
  Administered 2023-04-04: 50 ug via INTRAVENOUS
  Administered 2023-04-04: 25 ug via INTRAVENOUS

## 2023-04-04 MED ORDER — AMLODIPINE BESYLATE 5 MG PO TABS
5.0000 mg | ORAL_TABLET | Freq: Every day | ORAL | Status: DC
  Administered 2023-04-04 – 2023-04-05 (×2): 5 mg via ORAL
  Filled 2023-04-04 (×2): qty 1

## 2023-04-04 MED ORDER — METHYLPHENIDATE HCL 5 MG PO TABS: 20.0000 mg | ORAL_TABLET | Freq: Every day | ORAL | Status: DC

## 2023-04-04 MED ORDER — OXYCODONE HCL 5 MG PO TABS: 5.0000 mg | ORAL_TABLET | Freq: Once | ORAL | Status: DC | PRN

## 2023-04-04 MED ORDER — DEXAMETHASONE SODIUM PHOSPHATE 4 MG/ML IJ SOLN
4.0000 mg | Freq: Four times a day (QID) | INTRAMUSCULAR | Status: DC
  Administered 2023-04-04 (×2): 4 mg via INTRAVENOUS
  Filled 2023-04-04 (×2): qty 1

## 2023-04-04 MED ORDER — GABAPENTIN 300 MG PO CAPS: 300.0000 mg | ORAL_CAPSULE | ORAL | Status: DC

## 2023-04-04 MED ORDER — ARTIFICIAL TEARS OPHTHALMIC OINT
TOPICAL_OINTMENT | OPHTHALMIC | Status: AC
  Filled 2023-04-04: qty 3.5

## 2023-04-04 MED ORDER — LIDOCAINE 2% (20 MG/ML) 5 ML SYRINGE
INTRAMUSCULAR | Status: AC
  Filled 2023-04-04: qty 5

## 2023-04-04 MED ORDER — SODIUM CHLORIDE 0.9 % IV SOLN: 250.0000 mL | INTRAVENOUS | Status: DC

## 2023-04-04 MED ORDER — FENTANYL CITRATE (PF) 250 MCG/5ML IJ SOLN
INTRAMUSCULAR | Status: DC | PRN
  Administered 2023-04-04: 100 ug via INTRAVENOUS
  Administered 2023-04-04: 50 ug via INTRAVENOUS
  Administered 2023-04-04: 100 ug via INTRAVENOUS

## 2023-04-04 MED ORDER — THROMBIN 20000 UNITS EX SOLR
CUTANEOUS | Status: AC
  Filled 2023-04-04: qty 20000

## 2023-04-04 MED ORDER — OXYCODONE HCL 5 MG/5ML PO SOLN: 5.0000 mg | Freq: Once | ORAL | Status: DC | PRN

## 2023-04-04 MED ORDER — CHLORHEXIDINE GLUCONATE 0.12 % MT SOLN
OROMUCOSAL | Status: AC
  Administered 2023-04-04: 15 mL via OROMUCOSAL
  Filled 2023-04-04: qty 15

## 2023-04-04 MED ORDER — KETAMINE HCL 10 MG/ML IJ SOLN
INTRAMUSCULAR | Status: DC | PRN
Start: 2023-04-04 — End: 2023-04-04
  Administered 2023-04-04 (×2): 25 mg via INTRAVENOUS

## 2023-04-04 MED ORDER — CHLORHEXIDINE GLUCONATE 0.12 % MT SOLN: 15.0000 mL | Freq: Once | OROMUCOSAL | Status: AC

## 2023-04-04 MED ORDER — ROCURONIUM BROMIDE 10 MG/ML (PF) SYRINGE
PREFILLED_SYRINGE | INTRAVENOUS | Status: AC
  Filled 2023-04-04: qty 10

## 2023-04-04 MED ORDER — MIDAZOLAM HCL 2 MG/2ML IJ SOLN
INTRAMUSCULAR | Status: DC | PRN
  Administered 2023-04-04: 2 mg via INTRAVENOUS

## 2023-04-04 MED ORDER — SUGAMMADEX SODIUM 200 MG/2ML IV SOLN
INTRAVENOUS | Status: DC | PRN
  Administered 2023-04-04: 200 mg via INTRAVENOUS

## 2023-04-04 MED ORDER — SODIUM CHLORIDE 0.9% FLUSH: 3.0000 mL | INTRAVENOUS | Status: DC | PRN

## 2023-04-04 MED ORDER — PROPOFOL 10 MG/ML IV BOLUS
INTRAVENOUS | Status: AC
  Filled 2023-04-04: qty 20

## 2023-04-04 MED ORDER — LINACLOTIDE 145 MCG PO CAPS
145.0000 ug | ORAL_CAPSULE | Freq: Every day | ORAL | Status: DC
  Administered 2023-04-05: 145 ug via ORAL
  Filled 2023-04-04: qty 1

## 2023-04-04 MED ORDER — MIDAZOLAM HCL 2 MG/2ML IJ SOLN
INTRAMUSCULAR | Status: AC
Start: 2023-04-04 — End: ?
  Filled 2023-04-04: qty 2

## 2023-04-04 MED ORDER — GLYCOPYRROLATE PF 0.2 MG/ML IJ SOSY
PREFILLED_SYRINGE | INTRAMUSCULAR | Status: AC
  Filled 2023-04-04: qty 1

## 2023-04-04 MED ORDER — EZETIMIBE 10 MG PO TABS
10.0000 mg | ORAL_TABLET | Freq: Every day | ORAL | Status: DC
  Administered 2023-04-04 (×2): 10 mg via ORAL
  Filled 2023-04-04 (×2): qty 1

## 2023-04-04 MED ORDER — MIRABEGRON ER 25 MG PO TB24: 50.0000 mg | ORAL_TABLET | Freq: Every morning | ORAL | Status: DC

## 2023-04-04 MED ORDER — EPHEDRINE 5 MG/ML INJ
INTRAVENOUS | Status: AC
  Filled 2023-04-04: qty 5

## 2023-04-04 MED ORDER — FUROSEMIDE 20 MG PO TABS
20.0000 mg | ORAL_TABLET | Freq: Two times a day (BID) | ORAL | Status: DC
  Administered 2023-04-04 – 2023-04-05 (×2): 20 mg via ORAL
  Filled 2023-04-04 (×2): qty 1

## 2023-04-04 MED ORDER — CEFAZOLIN SODIUM-DEXTROSE 2-4 GM/100ML-% IV SOLN
2.0000 g | Freq: Three times a day (TID) | INTRAVENOUS | Status: AC
  Administered 2023-04-05: 2 g via INTRAVENOUS
  Filled 2023-04-04 (×2): qty 100

## 2023-04-04 MED ORDER — KCL IN DEXTROSE-NACL 10-5-0.45 MEQ/L-%-% IV SOLN
INTRAVENOUS | Status: DC
  Filled 2023-04-04: qty 1000

## 2023-04-04 MED ORDER — LACTATED RINGERS IV SOLN: INTRAVENOUS | Status: DC | PRN

## 2023-04-04 MED ORDER — PHENOL 1.4 % MT LIQD: 1.0000 | OROMUCOSAL | Status: DC | PRN

## 2023-04-04 MED ORDER — FERROUS GLUCONATE 324 (38 FE) MG PO TABS
324.0000 mg | ORAL_TABLET | Freq: Every day | ORAL | Status: DC
  Administered 2023-04-05: 324 mg via ORAL
  Filled 2023-04-04: qty 1

## 2023-04-04 SURGICAL SUPPLY — 55 items
ALLOGRAFT BONE FIBER KORE 5 (Bone Implant) IMPLANT
BAG COUNTER SPONGE SURGICOUNT (BAG) ×1 IMPLANT
BASKET BONE COLLECTION (BASKET) ×1 IMPLANT
BENZOIN TINCTURE PRP APPL 2/3 (GAUZE/BANDAGES/DRESSINGS) ×1 IMPLANT
BLADE BONE MILL MEDIUM (MISCELLANEOUS) ×1 IMPLANT
BLADE CLIPPER SURG (BLADE) IMPLANT
BUR CARBIDE MATCH 3.0 (BURR) ×1 IMPLANT
CANISTER SUCT 3000ML PPV (MISCELLANEOUS) ×1 IMPLANT
CNTNR URN SCR LID CUP LEK RST (MISCELLANEOUS) ×1 IMPLANT
COVER BACK TABLE 60X90IN (DRAPES) ×1 IMPLANT
DERMABOND ADVANCED .7 DNX12 (GAUZE/BANDAGES/DRESSINGS) ×1 IMPLANT
DRAPE C-ARM 42X72 X-RAY (DRAPES) ×2 IMPLANT
DRAPE C-ARMOR (DRAPES) ×1 IMPLANT
DRAPE LAPAROTOMY 100X72X124 (DRAPES) ×1 IMPLANT
DRAPE SURG 17X23 STRL (DRAPES) ×1 IMPLANT
DRSG OPSITE POSTOP 4X6 (GAUZE/BANDAGES/DRESSINGS) IMPLANT
DURAPREP 26ML APPLICATOR (WOUND CARE) ×1 IMPLANT
ELECT REM PT RETURN 9FT ADLT (ELECTROSURGICAL) ×1
ELECTRODE REM PT RTRN 9FT ADLT (ELECTROSURGICAL) ×1 IMPLANT
EVACUATOR 1/8 PVC DRAIN (DRAIN) ×1 IMPLANT
GAUZE 4X4 16PLY ~~LOC~~+RFID DBL (SPONGE) IMPLANT
GLOVE BIO SURGEON STRL SZ7 (GLOVE) IMPLANT
GLOVE BIO SURGEON STRL SZ8 (GLOVE) ×2 IMPLANT
GLOVE BIOGEL PI IND STRL 7.0 (GLOVE) IMPLANT
GOWN STRL REUS W/ TWL LRG LVL3 (GOWN DISPOSABLE) IMPLANT
GOWN STRL REUS W/ TWL XL LVL3 (GOWN DISPOSABLE) ×2 IMPLANT
GOWN STRL REUS W/TWL 2XL LVL3 (GOWN DISPOSABLE) IMPLANT
HEMOSTAT POWDER KIT SURGIFOAM (HEMOSTASIS) ×1 IMPLANT
KIT BASIN OR (CUSTOM PROCEDURE TRAY) ×1 IMPLANT
KIT INFUSE X SMALL 1.4CC (Orthopedic Implant) IMPLANT
KIT POSITION SURG JACKSON T1 (MISCELLANEOUS) ×1 IMPLANT
KIT TURNOVER KIT B (KITS) ×1 IMPLANT
MILL BONE PREP (MISCELLANEOUS) ×1 IMPLANT
NDL HYPO 25X1 1.5 SAFETY (NEEDLE) ×1 IMPLANT
NEEDLE HYPO 25X1 1.5 SAFETY (NEEDLE) ×1 IMPLANT
NS IRRIG 1000ML POUR BTL (IV SOLUTION) ×1 IMPLANT
PACK LAMINECTOMY NEURO (CUSTOM PROCEDURE TRAY) ×1 IMPLANT
PAD ARMBOARD 7.5X6 YLW CONV (MISCELLANEOUS) ×3 IMPLANT
ROD LORD LIPPED TI 5.5X35 (Rod) IMPLANT
SCREW CORT SHANK MOD 6.5X40 (Screw) IMPLANT
SCREW KODIAK 6.5X40 (Screw) IMPLANT
SCREW POLYAXIAL TULIP (Screw) IMPLANT
SET SCREW SPNE (Screw) IMPLANT
SOLUTION IRRIG SURGIPHOR (IV SOLUTION) ×1 IMPLANT
SPACER IDENTITI 9X7X25 15D (Spacer) IMPLANT
SPONGE SURGIFOAM ABS GEL 100 (HEMOSTASIS) ×1 IMPLANT
SPONGE T-LAP 4X18 ~~LOC~~+RFID (SPONGE) IMPLANT
STRIP CLOSURE SKIN 1/2X4 (GAUZE/BANDAGES/DRESSINGS) ×2 IMPLANT
SUT VIC AB 0 CT1 18XCR BRD8 (SUTURE) ×1 IMPLANT
SUT VIC AB 2-0 CP2 18 (SUTURE) ×1 IMPLANT
SUT VIC AB 3-0 SH 8-18 (SUTURE) ×2 IMPLANT
TOWEL GREEN STERILE (TOWEL DISPOSABLE) ×1 IMPLANT
TOWEL GREEN STERILE FF (TOWEL DISPOSABLE) ×1 IMPLANT
TRAY FOLEY MTR SLVR 16FR STAT (SET/KITS/TRAYS/PACK) ×1 IMPLANT
WATER STERILE IRR 1000ML POUR (IV SOLUTION) ×1 IMPLANT

## 2023-04-04 NOTE — Transfer of Care (Signed)
Immediate Anesthesia Transfer of Care Note  Patient: Summer Henderson  Procedure(s) Performed: Posterior Lumbar Interbody Fusion - Lumbar Four-Lumbar Five - Posterior Lateral and Interbody fusion (Back)  Patient Location: PACU  Anesthesia Type:General  Level of Consciousness: awake, alert , and oriented  Airway & Oxygen Therapy: Patient Spontanous Breathing and Patient connected to face mask oxygen  Post-op Assessment: Report given to RN and Post -op Vital signs reviewed and stable  Post vital signs: Reviewed and stable  Last Vitals:  Vitals Value Taken Time  BP 130/69 04/04/23 1246  Temp    Pulse 80 04/04/23 1256  Resp 19 04/04/23 1256  SpO2 96 % 04/04/23 1256  Vitals shown include unfiled device data.  Last Pain:  Vitals:   04/04/23 0855  PainSc: 10-Worst pain ever         Complications: No notable events documented.

## 2023-04-04 NOTE — Evaluation (Signed)
Physical Therapy Evaluation Patient Details Name: Summer Henderson MRN: [id number] DOB: 1961/04/07 Today's Date: 04/04/2023  History of Present Illness  62 yo female s/p PLIF L4-5 on 11/27. PMH includes anxiety, OA, CADs/p LHC 2016, cervical spine surgery 2011, COPD, depression, MS, fibromyalgia, HTN, preDM, L RTC repair.  Clinical Impression   Pt presents with generalized weakness, impaired balance with history of multiple falls, impaired safety awareness with decreased knowledge of back precautions, impaired activity tolerance. Pt to benefit from acute PT to address deficits. Pt ambulated short hallway distance with use of RW and assist to steady, pt veering R>L in hallway mostly due to intermittently letting go of RW. Pt lives at home with her son, who is Clinical biochemist, states she does not have anyone to physically assist her or help with ADLs at home. PT to progress mobility as tolerated, and will continue to follow acutely.          If plan is discharge home, recommend the following: A little help with walking and/or transfers;A little help with bathing/dressing/bathroom   Can travel by private vehicle   Yes    Equipment Recommendations Rolling walker (2 wheels);BSC/3in1;Other (comment) (pt requesting home aide)  Recommendations for Other Services       Functional Status Assessment Patient has had a recent decline in their functional status and demonstrates the ability to make significant improvements in function in a reasonable and predictable amount of time.     Precautions / Restrictions Precautions Precautions: Fall;Back Precaution Booklet Issued: Yes (comment) Precaution Comments: reviewed BLT rules, donning/doffing brace Required Braces or Orthoses: Spinal Brace Spinal Brace: Lumbar corset;Applied in sitting position Restrictions Weight Bearing Restrictions: No      Mobility  Bed Mobility Overal bed mobility: Needs Assistance Bed Mobility: Rolling, Sidelying to  Sit, Sit to Sidelying Rolling: Min assist Sidelying to sit: Min assist     Sit to sidelying: Min assist General bed mobility comments: assist for trunk rise, LE lift back into bed.    Transfers Overall transfer level: Needs assistance Equipment used: Rolling walker (2 wheels) Transfers: Sit to/from Stand Sit to Stand: Contact guard assist           General transfer comment: slow to rise, close guard for asfety, stand x3 from EOB x2 and toilet x1.    Ambulation/Gait Ambulation/Gait assistance: Min assist Gait Distance (Feet): 80 Feet Assistive device: Rolling walker (2 wheels) Gait Pattern/deviations: Step-through pattern, Decreased stride length, Trunk flexed Gait velocity: decr     General Gait Details: assist to steady and guide RW, cues for upright posture and placement in RW. pt listing R because she lifts L hand off RW multiple times causing it to veer  Stairs            Wheelchair Mobility     Tilt Bed    Modified Rankin (Stroke Patients Only)       Balance Overall balance assessment: Needs assistance, History of Falls Sitting-balance support: No upper extremity supported, Feet supported Sitting balance-Leahy Scale: Fair     Standing balance support: Bilateral upper extremity supported, During functional activity, Reliant on assistive device for balance Standing balance-Leahy Scale: Poor                               Pertinent Vitals/Pain Pain Assessment Pain Assessment: Faces Faces Pain Scale: Hurts even more Pain Location: back Pain Descriptors / Indicators: Discomfort, Grimacing, Guarding Pain Intervention(s): Limited activity within patient's  tolerance, Monitored during session, Repositioned    Home Living Family/patient expects to be discharged to:: Private residence Living Arrangements: Children Available Help at Discharge: Family Type of Home: Apartment Home Access: Level entry       Home Layout: One level Home  Equipment: Educational psychologist (4 wheels) Additional Comments: "I fall off the front of my mom's old tub chair"    Prior Function Prior Level of Function : Independent/Modified Independent             Mobility Comments: pt using rollator as needed, history of multiple falls most recently the morning of surgery in the shower ADLs Comments: indep, helps take care of her wheelchair-level son     Extremity/Trunk Assessment   Upper Extremity Assessment Upper Extremity Assessment: Defer to OT evaluation    Lower Extremity Assessment Lower Extremity Assessment: Generalized weakness    Cervical / Trunk Assessment Cervical / Trunk Assessment: Back Surgery  Communication   Communication Communication: No apparent difficulties Cueing Techniques: Verbal cues;Gestural cues  Cognition Arousal: Alert Behavior During Therapy: Impulsive Overall Cognitive Status: Within Functional Limits for tasks assessed                                 General Comments: hyperverbose, poor safety awareness, anticipate baseline        General Comments      Exercises     Assessment/Plan    PT Assessment Patient needs continued PT services  PT Problem List Decreased strength;Decreased mobility;Decreased activity tolerance;Decreased balance;Decreased knowledge of use of DME;Pain;Decreased cognition;Decreased knowledge of precautions;Decreased safety awareness       PT Treatment Interventions DME instruction;Therapeutic activities;Gait training;Therapeutic exercise;Patient/family education;Balance training;Stair training;Functional mobility training;Neuromuscular re-education    PT Goals (Current goals can be found in the Care Plan section)  Acute Rehab PT Goals Patient Stated Goal: home PT Goal Formulation: With patient Time For Goal Achievement: 04/18/23 Potential to Achieve Goals: Good    Frequency 7X/week     Co-evaluation               AM-PAC PT "6 Clicks"  Mobility  Outcome Measure Help needed turning from your back to your side while in a flat bed without using bedrails?: A Little Help needed moving from lying on your back to sitting on the side of a flat bed without using bedrails?: A Little Help needed moving to and from a bed to a chair (including a wheelchair)?: A Little Help needed standing up from a chair using your arms (e.g., wheelchair or bedside chair)?: A Little Help needed to walk in hospital room?: A Little Help needed climbing 3-5 steps with a railing? : A Little 6 Click Score: 18    End of Session Equipment Utilized During Treatment: Back brace Activity Tolerance: Patient tolerated treatment well;Patient limited by fatigue Patient left: in bed;with call bell/phone within reach;with bed alarm set;with family/visitor present Nurse Communication: Mobility status PT Visit Diagnosis: Other abnormalities of gait and mobility (R26.89);Muscle weakness (generalized) (M62.81)    Time: 1610-9604 PT Time Calculation (min) (ACUTE ONLY): 28 min   Charges:   PT Evaluation $PT Eval Low Complexity: 1 Low PT Treatments $Therapeutic Activity: 8-22 mins PT General Charges $$ ACUTE PT VISIT: 1 Visit         Marye Round, PT DPT Acute Rehabilitation Services Secure Chat Preferred  Office [phone]   Summer Henderson 04/04/2023, 5:15 PM

## 2023-04-04 NOTE — Progress Notes (Signed)
Pt reports she fell today while taking a shower. Pt denies LOC, per pt she did not hit her head; no bruising is noted. States she has 10/10 pain in her lower back. Dr. Yetta Barre is aware.

## 2023-04-04 NOTE — Op Note (Signed)
04/04/2023  12:40 PM  PATIENT:  Summer Henderson  62 y.o. female  PRE-OPERATIVE DIAGNOSIS: Spondylolisthesis L4-5, spinal stenosis L4-5, back pain with radiculopathy  POST-OPERATIVE DIAGNOSIS:  same  PROCEDURE:   1. Decompressive lumbar laminectomy, hemi facetectomy and foraminotomies L4-5 requiring more work than would be required for a simple exposure of the disk for PLIF in order to adequately decompress the neural elements and address the spinal stenosis 2. Posterior lumbar interbody fusion L4-5 using PTI interbody cages packed with morcellized allograft and autograft  3. Posterior fixation L4-5 using Alphatec cortical pedicle screws.  4. Intertransverse arthrodesis L4-5 using morcellized autograft and allograft.  SURGEON:  Marikay Alar, MD  ASSISTANTS: Verlin Dike, FNP  ANESTHESIA:  General  EBL: 200 ml  Total I/O In: -  Out: 200 [Blood:200]  BLOOD ADMINISTERED:none  DRAINS: none   INDICATION FOR PROCEDURE: This patient presented with severe back pain with left leg pain. Imaging revealed spondylolisthesis L4-5 with spinal stenosis. The patient tried a reasonable attempt at conservative medical measures without relief. I recommended decompression and instrumented fusion to address the stenosis as well as the segmental  instability.  Patient understood the risks, benefits, and alternatives and potential outcomes and wished to proceed.  PROCEDURE DETAILS:  The patient was brought to the operating room. After induction of generalized endotracheal anesthesia the patient was rolled into the prone position on chest rolls and all pressure points were padded. The patient's lumbar region was cleaned and then prepped with DuraPrep and draped in the usual sterile fashion. Anesthesia was injected and then a dorsal midline incision was made and carried down to the lumbosacral fascia. The fascia was opened and the paraspinous musculature was taken down in a subperiosteal fashion to expose  L4-5. A self-retaining retractor was placed. Intraoperative fluoroscopy confirmed my level, and I started with placement of the L4 cortical pedicle screws. The pedicle screw entry zones were identified utilizing surface landmarks and  AP and lateral fluoroscopy. I scored the cortex with the high-speed drill and then used the hand drill to drill an upward and outward direction into the pedicle. I then tapped line to line. I then placed a 6.5 x 40 mm cortical pedicle screw into the pedicles of L4 bilaterally.    I then turned my attention to the decompression and complete lumbar laminectomies, hemi- facetectomies, and foraminotomies were performed at L4-5.  My nurse practitioner was directly involved in the decompression and exposure of the neural elements. the patient had significant spinal stenosis and this required more work than would be required for a simple exposure of the disc for posterior lumbar interbody fusion which would only require a limited laminotomy. Much more generous decompression and generous foraminotomy was undertaken in order to adequately decompress the neural elements and address the patient's leg pain. The yellow ligament was removed to expose the underlying dura and nerve roots, and generous foraminotomies were performed to adequately decompress the neural elements. Both the exiting and traversing nerve roots were decompressed on both sides until a coronary dilator passed easily along the nerve roots. Once the decompression was complete, I turned my attention to the posterior lower lumbar interbody fusion. The epidural venous vasculature was coagulated and cut sharply. Disc space was incised and the initial discectomy was performed with pituitary rongeurs. The disc space was distracted with sequential distractors to a height of 9 mm. We then used a series of scrapers and shavers to prepare the endplates for fusion. The midline was prepared with Epstein curettes.  Once the complete  discectomy was finished, we packed an appropriate sized interbody cage with local autograft and morcellized allograft, gently retracted the nerve root, and tapped the cage into position at L4-5.  The midline between the cages was packed with morselized autograft and allograft.   We then turned our attention to the placement of the lower pedicle screws. The pedicle screw entry zones were identified utilizing surface landmarks and fluoroscopy. I drilled into each pedicle utilizing the hand drill, and tapped each pedicle with the appropriate tap. We palpated with a ball probe to assure no break in the cortex. We then placed 6.5 x 40 mm pedicle screws into the pedicles bilaterally at L5.  My nurse practitioner assisted in placement of the pedicle screws.  We then decorticated the transverse processes and laid a mixture of morcellized autograft and allograft out over these to perform intertransverse arthrodesis at L4-5. We then placed lordotic rods into the multiaxial screw heads of the pedicle screws and locked these in position with the locking caps and anti-torque device. We then checked our construct with AP and lateral fluoroscopy. Irrigated with copious amounts of 0.5% povidone iodine solution followed by saline solution. Inspected the nerve roots once again to assure adequate decompression, lined to the dura with Gelfoam,  and then we closed the muscle and the fascia with 0 Vicryl. Closed the subcutaneous tissues with 2-0 Vicryl and subcuticular tissues with 3-0 Vicryl. The skin was closed with benzoin and Steri-Strips. Dressing was then applied, the patient was awakened from general anesthesia and transported to the recovery room in stable condition. At the end of the procedure all sponge, needle and instrument counts were correct.   PLAN OF CARE: admit to inpatient  PATIENT DISPOSITION:  PACU - hemodynamically stable.   Delay start of Pharmacological VTE agent (>24hrs) due to surgical blood loss or risk  of bleeding:  yes

## 2023-04-04 NOTE — Progress Notes (Signed)
Pt. Transfer from PACU to 3W29 S/P L4-L5 surgery, she transfer from [facility] to bed, surgery site Clean with honey cone dressing intact, no bleeding, little swollen and ice pack place.

## 2023-04-04 NOTE — Progress Notes (Signed)
Per pt she was not able to complete 5 CHG showers due to pain in her back. She was able to take a shower with CHG soap this morning. CHG wipes used on the op site this am.

## 2023-04-04 NOTE — H&P (Signed)
Subjective: Patient is a 62 y.o. female admitted for PLIF. Onset of symptoms was several months ago, gradually worsening since that time.  The pain is rated intense, and is located at the across the lower back and radiates to LLE. The pain is described as aching and occurs all day. The symptoms have been progressive. Symptoms are exacerbated by exercise, standing, and walking for more than a few minutes. 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 06/2014 abnl MV;  b. 06/2014 Cath: LM nl, LAD 19m (2.75x28 Promus DES), LCX nl, RCA 60-36m (FFR = 0.80 -> 3.0x24 Promus DES), EF 55-65%.   Carpal tunnel syndrome    Cervical dysplasia    Chronic fatigue    COPD (chronic obstructive pulmonary disease) (HCC)    Depression    Family history of adverse reaction to anesthesia    son - n/v   Fibromyalgia    GERD (gastroesophageal reflux disease)    Hypertension    Mitral valve prolapse    Multiple sclerosis (HCC)    Pneumonia    Pre-diabetes    Prolonged QT interval    RLS (restless legs syndrome) 04/28/2015    Past Surgical History:  Procedure Laterality Date   ABDOMINAL HYSTERECTOMY     BICEPS TENDON REPAIR     bladder resuspension     CARPAL TUNNEL RELEASE     CERVICAL SPINE SURGERY  10-16-2009   CORONARY ANGIOPLASTY WITH STENT PLACEMENT     DILATION AND CURETTAGE OF UTERUS     FOOT SURGERY Bilateral    For bunions   HAMMER TOE SURGERY Left    LEFT HEART CATHETERIZATION WITH CORONARY ANGIOGRAM N/A 06/29/2014   Procedure: LEFT HEART CATHETERIZATION WITH CORONARY ANGIOGRAM;  Surgeon: Micheline Chapman, MD;  Location: [facility] CATH LAB;  Service: Cardiovascular;  Laterality: N/A;   ORIF TOE FRACTURE Right    Prolong QTC     Heart   rectovaginal fistula repair     ROTATOR CUFF REPAIR Left     Prior to Admission medications   Medication Sig Start Date End Date Taking? Authorizing Provider   amLODipine (NORVASC) 5 MG tablet Take 1 tablet (5 mg total) by mouth daily. 09/26/22  Yes Lewayne Bunting, MD  amoxicillin (AMOXIL) 500 MG capsule Take 2,000 mg by mouth once. 03/14/23  Yes [provider]  aspirin 81 MG chewable tablet Chew 81 mg by mouth daily.   Yes [provider]  atorvastatin (LIPITOR) 80 MG tablet Take 1 tablet (80 mg total) by mouth daily. 09/29/22  Yes Lewayne Bunting, MD  Cholecalciferol (VITAMIN D-3) 125 MCG (5000 UT) TABS Take 5,000 Units by mouth daily.   Yes [provider]  ciprofloxacin (CIPRO) 500 MG tablet Take 500 mg by mouth as needed (Bladder infections). 03/06/23  Yes [provider]  DULoxetine (CYMBALTA) 60 MG capsule Take 1 capsule by mouth daily. 03/02/23 03/01/24 Yes [provider]  esomeprazole (NEXIUM) 40 MG capsule Take 1 capsule (40 mg total) by mouth daily at 12 noon. Patient taking differently: Take 40 mg by mouth daily. 01/24/23  Yes Lewayne Bunting, MD  ezetimibe (ZETIA) 10 MG tablet Take 1 tablet (10 mg total) by mouth daily. 08/21/22  Yes Lewayne Bunting, MD  ferrous gluconate (FERGON) 324 MG tablet Take 324 mg by mouth daily with breakfast. 07/25/21  Yes [provider]  furosemide (LASIX) 20 MG tablet Take 2 tablets (40 mg total) by mouth daily. Patient taking differently: Take 20 mg by mouth 2 (two) times daily. 09/29/22  Yes Lewayne Bunting, MD  linaclotide Karlene Einstein) 145 MCG CAPS capsule Take 145 mcg by mouth daily before breakfast.   Yes [provider]  losartan (COZAAR) 100 MG tablet Take 1 tablet (100 mg total) by mouth daily. 10/03/22  Yes Lewayne Bunting, MD  methylphenidate (RITALIN) 20 MG tablet Take 1 tablet (20 mg total) by mouth daily. At noon Patient taking differently: Take 20 mg by mouth daily. 03/19/23  Yes Glean Salvo, NP  metoprolol succinate (TOPROL-XL) 50 MG 24 hr tablet Take 1 tablet (50 mg total) by mouth daily. Take with or immediately following a  meal. 12/27/22  Yes Crenshaw, Madolyn Frieze, MD  mirabegron ER (MYRBETRIQ) 50 MG TB24 tablet Take 50 mg by mouth every morning. 07/25/20  Yes [provider]  NASONEX 50 MCG/ACT nasal spray Place 50 sprays into the nose daily at 12 noon. 09/16/12  Yes [provider]  ocrelizumab 600 mg in sodium chloride 0.9 % 500 mL Inject 600 mg into the vein every 6 (six) months.   Yes [provider]  potassium chloride SA (KLOR-CON M20) 20 MEQ tablet Take 2 tablets (40 mEq total) by mouth daily. 04/03/23 07/02/23 Yes Lewayne Bunting, MD  pramipexole (MIRAPEX) 0.25 MG tablet Take 1 tablet (0.25 mg total) by mouth at bedtime. 07/13/22  Yes Glean Salvo, NP  traMADol (ULTRAM) 50 MG tablet Take 100 mg by mouth every 6 (six) hours as needed for moderate pain (pain score 4-6).   Yes [provider]  albuterol (VENTOLIN HFA) 108 (90 Base) MCG/ACT inhaler Inhale 2 puffs by mouth into the lungs every 6 (six) hours as needed for wheezing or shortness of breath. 08/09/21   Vanetta Mulders, MD  Azelastine HCl 137 MCG/SPRAY SOLN Place 2 sprays into both nostrils daily as needed (Congestion). 08/11/21   [provider]  diphenhydrAMINE (BENADRYL) 50 MG capsule Take 1 capsule by mouth as needed for itching (for side effect for Ocrevus). 05/17/21   [provider]  gabapentin (NEURONTIN) 100 MG capsule Take 1 capsule (100 mg total) by mouth at bedtime. Patient taking differently: Take 100 mg by mouth at bedtime as needed (Nerve pain). 07/13/22   Glean Salvo, NP  MEDROL 4 MG TBPK tablet Take 4 mg by mouth See admin instructions. Taper Patient not taking: Reported on 03/28/2023 03/16/23   [provider]   Allergies  Allergen Reactions   Demerol [Meperidine] Other (See Comments)    [derm] rash, swelling, itching Other reaction(s): Other (See Comments) [derm] rash, swelling, itching   Erythromycin Rash   Morphine And Codeine Itching, Swelling and Rash    Denied allergy to  codeine 03/23/23   Sulfa Antibiotics Rash   Diflucan [Fluconazole] Other (See Comments)    States she has prolonged QT and cannot take this.   Other     Other reaction(s): Other (See Comments) Z-pak causes EKG changes, was told by cardiologist not to take this.   Erythromycin Base Rash    [Derm] rash   Fesoterodine Palpitations   Sulfacetamide Sodium Rash   Tape Other (See Comments)    Adhesive tape And Steri Strips; Local reaction-burns skin   Toviaz [Fesoterodine Fumarate Er] Palpitations    Social History   Tobacco Use   Smoking status: Every Day    Current  packs/day: 1.00    Average packs/day: 1 pack/day for 35.0 years (35.0 ttl pk-yrs)    Types: Cigarettes   Smokeless tobacco: Never  Substance Use Topics   Alcohol use: No    Alcohol/week: 0.0 standard drinks of alcohol    Family History  Problem Relation Age of Onset   Melanoma Mother    Colon polyps Mother    Diabetes Mother    Breast cancer Maternal Grandmother    Colon cancer Maternal Grandmother    Brain cancer Maternal Grandfather    Lung cancer Maternal Grandfather    Diabetes Maternal Grandfather    Kidney disease Neg Hx    Esophageal cancer Neg Hx    Gallbladder disease Neg Hx      Review of Systems  Positive ROS: neg  All other systems have been reviewed and were otherwise negative with the exception of those mentioned in the HPI and as above.  Objective: Vital signs in last 24 hours: Temp:  [97.8 F (36.6 C)] 97.8 F (36.6 C) (11/27 0851) Pulse Rate:  [79] 79 (11/27 0851) Resp:  [18] 18 (11/27 0851) BP: (124)/(91) 124/91 (11/27 0851) SpO2:  [96 %] 96 % (11/27 0851) Weight:  [77.6 kg] 77.6 kg (11/27 0912)  General Appearance: Alert, cooperative, no distress, appears stated age Head: Normocephalic, without obvious abnormality, atraumatic Eyes: PERRL, conjunctiva/corneas clear, EOM's intact    Neck: Supple, symmetrical, trachea midline Back: Symmetric, no curvature, ROM normal, no CVA  tenderness Lungs:  respirations unlabored Heart: Regular rate and rhythm Abdomen: Soft, non-tender Extremities: Extremities normal, atraumatic, no cyanosis or edema Pulses: 2+ and symmetric all extremities Skin: Skin color, texture, turgor normal, no rashes or lesions  NEUROLOGIC:   Mental status: Alert and oriented x4,  no aphasia, good attention span, fund of knowledge, and memory Motor Exam - grossly normal Sensory Exam - grossly normal Reflexes: 1+ Coordination - grossly normal Gait - not tested Balance - not tested Cranial Nerves: I: smell Not tested  II: visual acuity  OS: nl    OD: nl  II: visual fields Full to confrontation  II: pupils Equal, round, reactive to light  III,VII: ptosis None  III,IV,VI: extraocular muscles  Full ROM  V: mastication Normal  V: facial light touch sensation  Normal  V,VII: corneal reflex  Present  VII: facial muscle function - upper  Normal  VII: facial muscle function - lower Normal  VIII: hearing Not tested  IX: soft palate elevation  Normal  IX,X: gag reflex Present  XI: trapezius strength  5/5  XI: sternocleidomastoid strength 5/5  XI: neck flexion strength  5/5  XII: tongue strength  Normal    Data Review Lab Results  Component Value Date   WBC 14.1 (H) 03/25/2023   HGB 15.0 03/25/2023   HCT 42.6 03/25/2023   MCV 89.3 03/25/2023   PLT 477 (H) 03/25/2023   Lab Results  Component Value Date   NA 141 04/02/2023   K 3.3 (L) 04/02/2023   CL 102 04/02/2023   CO2 22 04/02/2023   BUN 10 04/02/2023   CREATININE 0.85 04/02/2023   GLUCOSE 108 (H) 04/02/2023   Lab Results  Component Value Date   INR 1.0 03/21/2023    Assessment/Plan:  Estimated body mass index is 31.29 kg/m as calculated from the following:   Height as of this encounter: 5\' 2"  (1.575 m).   Weight as of this encounter: 77.6 kg. Patient admitted for PLIF L4-5. Patient has failed a reasonable attempt at conservative  therapy.  I explained the condition and  procedure to the patient and answered any questions.  Patient wishes to proceed with procedure as planned. Understands risks/ benefits and typical outcomes of procedure.   Tia Alert 04/04/2023 9:49 AM

## 2023-04-04 NOTE — Anesthesia Procedure Notes (Addendum)
Procedure Name: Intubation Date/Time: 04/04/2023 10:24 AM  Performed by: Camillia Herter, CRNAPre-anesthesia Checklist: Patient identified, Emergency Drugs available, Suction available and Patient being monitored Patient Re-evaluated:Patient Re-evaluated prior to induction Oxygen Delivery Method: Circle System Utilized Preoxygenation: Pre-oxygenation with 100% oxygen Induction Type: IV induction Ventilation: Mask ventilation without difficulty Laryngoscope Size: Glidescope and 3 Grade View: Grade I Tube type: Oral Number of attempts: 1 Airway Equipment and Method: Stylet and Video-laryngoscopy Placement Confirmation: ETT inserted through vocal cords under direct vision, positive ETCO2 and breath sounds checked- equal and bilateral Secured at: 21 cm Tube secured with: Tape Dental Injury: Teeth and Oropharynx as per pre-operative assessment

## 2023-04-04 NOTE — Anesthesia Postprocedure Evaluation (Signed)
Anesthesia Post Note  Patient: Summer Henderson  Procedure(s) Performed: Posterior Lumbar Interbody Fusion - Lumbar Four-Lumbar Five - Posterior Lateral and Interbody fusion (Back)     Patient location during evaluation: PACU Anesthesia Type: General Level of consciousness: awake and alert Pain management: pain level controlled Vital Signs Assessment: post-procedure vital signs reviewed and stable Respiratory status: spontaneous breathing, nonlabored ventilation, respiratory function stable and patient connected to nasal cannula oxygen Cardiovascular status: blood pressure returned to baseline and stable Postop Assessment: no apparent nausea or vomiting Anesthetic complications: no   No notable events documented.  Last Vitals:  Vitals:   04/04/23 1337 04/04/23 1601  BP: 120/68 (!) 85/55  Pulse: 64 69  Resp: 19 17  Temp: (!) 36.4 C 36.6 C  SpO2: 95% 98%    Last Pain:  Vitals:   04/04/23 1601  TempSrc: Oral  PainSc:                  Larimer Nation

## 2023-04-05 DIAGNOSIS — M4316 Spondylolisthesis, lumbar region: Secondary | ICD-10-CM | POA: Diagnosis not present

## 2023-04-05 LAB — GLUCOSE, CAPILLARY: Glucose-Capillary: 149 mg/dL — ABNORMAL HIGH (ref 70–99)

## 2023-04-05 MED ORDER — METHOCARBAMOL 500 MG PO TABS
500.0000 mg | ORAL_TABLET | Freq: Four times a day (QID) | ORAL | 1 refills | Status: DC | PRN
  Filled 2023-04-05: qty 60, 15d supply, fill #0

## 2023-04-05 MED ORDER — METHOCARBAMOL 500 MG PO TABS: 500.0000 mg | ORAL_TABLET | Freq: Four times a day (QID) | ORAL | 1 refills | Status: AC | PRN

## 2023-04-05 MED ORDER — EZETIMIBE 10 MG PO TABS: 10.0000 mg | ORAL_TABLET | Freq: Every day | ORAL | Status: DC

## 2023-04-05 MED ORDER — OXYCODONE HCL 10 MG PO TABS: 5.0000 mg | ORAL_TABLET | ORAL | 0 refills | Status: AC | PRN

## 2023-04-05 MED ORDER — OXYCODONE HCL 10 MG PO TABS
5.0000 mg | ORAL_TABLET | ORAL | 0 refills | Status: DC | PRN
  Filled 2023-04-05: qty 40, 14d supply, fill #0

## 2023-04-05 MED ORDER — METOPROLOL SUCCINATE ER 50 MG PO TB24: 50.0000 mg | ORAL_TABLET | Freq: Every day | ORAL | Status: DC

## 2023-04-05 NOTE — Progress Notes (Signed)
Pt wheeled off unit by this RN. Pt stated understanding of DC instructions meds sent to appropriate pharmacy for pick up today.

## 2023-04-05 NOTE — Evaluation (Signed)
Occupational Therapy Evaluation Patient Details Name: Summer Henderson MRN: [id number] DOB: Dec 21, 1960 Today's Date: 04/05/2023   History of Present Illness 62 yo female s/p PLIF L4-5 on 11/27. PMH includes anxiety, OA, CADs/p LHC 2016, cervical spine surgery 2011, COPD, depression, MS, fibromyalgia, HTN, preDM, L RTC repair.   Clinical Impression   Summer Henderson was evaluated s/p the above admission list. She is mod I with rollator at baseline, of note she reports multiple recent falls. Upon evaluation the pt was limited by spinal precautions, hyperverbosity, limited attention, surgical pain, balance and limited activity tolerance. Overall she needed superivsion A for transfers and mobility with heavy reliance on RW. Due to the deficits listed below the pt also needs up to min A for LB ADLs with cues for compensatory techniques. Of note, pt with poor adherence to spinal precautions throughout despite maximal cues. Educated on use of tub bench to decrease fall risk. Pt will benefit from continued acute OT services and HHOT.  Recommend RW and BSC to reduce fall risk, of note pt eager to d/c from hospital and unsure if necessary DME will arrive prior to her ride arriving.         If plan is discharge home, recommend the following: A little help with walking and/or transfers;A little help with bathing/dressing/bathroom;Assistance with cooking/housework;Direct supervision/assist for medications management;Direct supervision/assist for financial management;Assist for transportation    Functional Status Assessment  Patient has had a recent decline in their functional status and demonstrates the ability to make significant improvements in function in a reasonable and predictable amount of time.  Equipment Recommendations  BSC/3in1;Other (comment) (RW)       Precautions / Restrictions Precautions Precautions: Fall;Back Precaution Booklet Issued: Yes (comment) Precaution Comments: reviewed BLT rules,  donning/doffing brace Required Braces or Orthoses: Spinal Brace Spinal Brace: Lumbar corset;Applied in sitting position Restrictions Weight Bearing Restrictions: No      Mobility Bed Mobility Overal bed mobility: Needs Assistance             General bed mobility comments: sitting EOB upon arrival    Transfers Overall transfer level: Needs assistance Equipment used: Rolling walker (2 wheels) Transfers: Sit to/from Stand Sit to Stand: Contact guard assist                  Balance Overall balance assessment: Needs assistance, History of Falls Sitting-balance support: No upper extremity supported, Feet supported Sitting balance-Leahy Scale: Fair     Standing balance support: Single extremity supported, During functional activity Standing balance-Leahy Scale: Fair           ADL either performed or assessed with clinical judgement   ADL Overall ADL's : Needs assistance/impaired Eating/Feeding: Independent   Grooming: Supervision/safety;Standing   Upper Body Bathing: Set up;Sitting   Lower Body Bathing: Minimal assistance;Sit to/from stand   Upper Body Dressing : Set up;Sitting   Lower Body Dressing: Minimal assistance;Sit to/from stand   Toilet Transfer: Supervision/safety;Ambulation;Rolling walker (2 wheels)   Toileting- Clothing Manipulation and Hygiene: Supervision/safety;Sit to/from stand       Functional mobility during ADLs: Supervision/safety;Rolling walker (2 wheels) General ADL Comments: needed maximal cues for spinal precautions, min A for LB ADLs to maintain precuations.     Vision Baseline Vision/History: 0 No visual deficits Vision Assessment?: No apparent visual deficits     Perception Perception: Within Functional Limits       Praxis Praxis: WFL       Pertinent Vitals/Pain Pain Assessment Pain Assessment: Faces Faces Pain Scale: Hurts a  little bit Pain Location: back Pain Descriptors / Indicators: Discomfort, Grimacing,  Guarding Pain Intervention(s): Limited activity within patient's tolerance, Monitored during session     Extremity/Trunk Assessment Upper Extremity Assessment Upper Extremity Assessment: Generalized weakness   Lower Extremity Assessment Lower Extremity Assessment: Defer to PT evaluation   Cervical / Trunk Assessment Cervical / Trunk Assessment: Back Surgery   Communication Communication Communication: No apparent difficulties   Cognition Arousal: Alert Behavior During Therapy: Impulsive Overall Cognitive Status: Within Functional Limits for tasks assessed       General Comments: hyperverbose, poor safety awareness, anticipate baseline. self distracted, needed maximal cues for back precautions     General Comments  VSS            Home Living Family/patient expects to be discharged to:: Private residence Living Arrangements: Children Available Help at Discharge: Family Type of Home: Apartment Home Access: Level entry     Home Layout: One level     Bathroom Shower/Tub: Chief Strategy Officer: Standard     Home Equipment: Educational psychologist (4 wheels)   Additional Comments: son is in a wc      Prior Functioning/Environment Prior Level of Function : Independent/Modified Independent             Mobility Comments: pt using rollator as needed, history of multiple falls most recently the morning of surgery in the shower ADLs Comments: indep, helps take care of her wheelchair-level son        OT Problem List: Decreased strength;Decreased range of motion;Decreased activity tolerance;Impaired balance (sitting and/or standing);Decreased safety awareness;Decreased knowledge of use of DME or AE;Decreased knowledge of precautions      OT Treatment/Interventions: Therapeutic exercise;Self-care/ADL training;Therapeutic activities;Cognitive remediation/compensation;Patient/family education    OT Goals(Current goals can be found in the care plan  section) Acute Rehab OT Goals Patient Stated Goal: home OT Goal Formulation: With patient Time For Goal Achievement: 04/19/23 Potential to Achieve Goals: Good ADL Goals Pt Will Perform Tub/Shower Transfer: with supervision;tub bench Additional ADL Goal #1: Pt will complete all BADLs with mod I  OT Frequency: Min 1X/week       AM-PAC OT "6 Clicks" Daily Activity     Outcome Measure Help from another person eating meals?: None Help from another person taking care of personal grooming?: A Little Help from another person toileting, which includes using toliet, bedpan, or urinal?: A Little Help from another person bathing (including washing, rinsing, drying)?: A Little Help from another person to put on and taking off regular upper body clothing?: A Little Help from another person to put on and taking off regular lower body clothing?: A Little 6 Click Score: 19   End of Session Equipment Utilized During Treatment: Rolling walker (2 wheels);Back brace Nurse Communication: Mobility status  Activity Tolerance: Patient tolerated treatment well Patient left: in bed;with call bell/phone within reach  OT Visit Diagnosis: Unsteadiness on feet (R26.81);Other abnormalities of gait and mobility (R26.89);Muscle weakness (generalized) (M62.81);History of falling (Z91.81);Repeated falls (R29.6)                Time: 8295-6213 OT Time Calculation (min): 20 min Charges:  OT General Charges $OT Visit: 1 Visit OT Evaluation $OT Eval Moderate Complexity: 1 Mod  Derenda Mis, OTR/L Acute Rehabilitation Services Office [phone] Secure Chat Communication Preferred   Donia Pounds 04/05/2023, 7:54 AM

## 2023-04-05 NOTE — Discharge Summary (Addendum)
Physician Discharge Summary  Patient ID: Summer Henderson MRN: [id number] DOB/AGE: [birth year] 62 y.o.  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 The patient tolerated the procedure well and was taken to the recovery room and then to the floor in stable condition. The hospital course was routine. There were no complications. The wound remained clean dry and intact. Pt had appropriate mild back soreness. No complaints of leg pain or new N/T/W. The patient remained afebrile with stable vital signs, and tolerated a regular diet. The patient continued to increase activities, and pain was well controlled with oral pain medications.   Consults: None  Significant Diagnostic Studies:  Results for orders placed or performed during the hospital encounter of 04/04/23  Glucose, capillary  Result Value Ref Range   Glucose-Capillary 238 (H) 70 - 99 mg/dL  Glucose, capillary  Result Value Ref Range   Glucose-Capillary 149 (H) 70 - 99 mg/dL  ABO/Rh  Result Value Ref Range   ABO/RH(D)      O POS Performed at [facility], [address] [place], [place] [address]     DG Lumbar Spine 2-3 Views  Result Date: 04/04/2023 CLINICAL DATA:  Elective surgery. EXAM: LUMBAR SPINE - 2-3 VIEW COMPARISON:  Preoperative CT FINDINGS: Two fluoroscopic spot views of the lumbar spine obtained in the operating room. Posterior rod and pedicle screw fusion L4-L5 with interbody spacers. Fluoroscopy time 1 minutes. Dose 45.75 mGy IMPRESSION: Intraoperative fluoroscopy during L4-L5 fusion. Electronically Signed   By: Narda Rutherford M.D.   On: 04/04/2023 16:19   DG C-Arm 1-60 Min-No Report  Result Date: 04/04/2023 Fluoroscopy was utilized by the  requesting physician.  No radiographic interpretation.   DG C-Arm 1-60 Min-No Report  Result Date: 04/04/2023 Fluoroscopy was utilized by the requesting physician.  No radiographic interpretation.   VAS [place] LOWER EXTREMITY VENOUS (DVT)  Result Date: 03/26/2023  Lower Venous DVT Study Patient Name:  NORABELLE SALES  Date of Exam:   03/26/2023 Medical Rec #: [id number]      Accession #:    [id number] Date of Birth: [birth year]      Patient Gender: F Patient Age:   68 years Exam Location:  [facility] Procedure:      VAS [place] LOWER EXTREMITY VENOUS (DVT) Referring Phys: Arlys John CRENSHAW --------------------------------------------------------------------------------  Indications: Patient indicates her left leg swells up when her left sciatica flares up. She denies chest pain and SOB. Other Indications: Patient is scheduled for back surgery 04/06/2023. Comparison Study: Prior bilateral leg venous duplex exam on 01/15/2019 showed no                   evidence of DVT. Performing Technologist: Carlos American RVT, RDCS (AE), RDMS  Examination Guidelines: A complete evaluation includes B-mode imaging, spectral Doppler, color Doppler, and power Doppler as needed of all accessible portions of each vessel. Bilateral testing is considered an integral part of a complete examination. Limited examinations for reoccurring indications may be performed as noted. The reflux portion of the exam is performed with the patient in reverse Trendelenburg.  +-----+---------------+---------+-----------+----------+--------------+ RIGHTCompressibilityPhasicitySpontaneityPropertiesThrombus Aging +-----+---------------+---------+-----------+----------+--------------+ CFV  Full           Yes      Yes                                 +-----+---------------+---------+-----------+----------+--------------+   +---------+---------------+---------+-----------+----------+--------------+  LEFT      CompressibilityPhasicitySpontaneityPropertiesThrombus Aging +---------+---------------+---------+-----------+----------+--------------+ CFV      Full           Yes      Yes                                 +---------+---------------+---------+-----------+----------+--------------+ SFJ      Full           Yes      Yes                                 +---------+---------------+---------+-----------+----------+--------------+ FV Prox  Full           Yes      Yes                                 +---------+---------------+---------+-----------+----------+--------------+ FV Mid   Full           Yes      Yes                                 +---------+---------------+---------+-----------+----------+--------------+ FV DistalFull           Yes      Yes                                 +---------+---------------+---------+-----------+----------+--------------+ PFV      Full                                                        +---------+---------------+---------+-----------+----------+--------------+ POP      Full           Yes      Yes                                 +---------+---------------+---------+-----------+----------+--------------+ PTV      Full           Yes      Yes                                 +---------+---------------+---------+-----------+----------+--------------+ PERO     Full           Yes      Yes                                 +---------+---------------+---------+-----------+----------+--------------+ Gastroc  Full                                                        +---------+---------------+---------+-----------+----------+--------------+ GSV      Full           Yes      Yes                                 +---------+---------------+---------+-----------+----------+--------------+  Findings reported to Dr. Jens Som through [facility] email at 2:45 pm.  Summary: RIGHT: - No evidence of common femoral vein obstruction.    LEFT: - No evidence of deep vein thrombosis in the lower extremity. No indirect evidence of obstruction proximal to the inguinal ligament.  - No cystic structure found in the popliteal fossa.  *See table(s) above for measurements and observations. Electronically signed by Coral Else MD on 03/26/2023 at 3:11:53 PM.    Final    CT Lumbar Spine Wo Contrast  Result Date: 03/26/2023 CLINICAL DATA:  Low back pain, increased fracture risk EXAM: CT LUMBAR SPINE WITHOUT CONTRAST TECHNIQUE: Multidetector CT imaging of the lumbar spine was performed without intravenous contrast administration. Multiplanar CT image reconstructions were also generated. RADIATION DOSE REDUCTION: This exam was performed according to the departmental dose-optimization program which includes automated exposure control, adjustment of the mA and/or kV according to patient size and/or use of iterative reconstruction technique. COMPARISON:  03/01/2023 MRI lumbar spine FINDINGS: Segmentation: 5 lumbar type vertebral bodies. Alignment: Unchanged grade 1 anterolisthesis of L4 on L5. Vertebrae: No acute fracture or focal pathologic process. Paraspinal and other soft tissues: Vascular calcification. Right renal cyst, which is incompletely evaluated but was previously described on a 02/06/2021 CT abdomen pelvis. Fat within the wall of the ascending colon is also described on the 02/06/2021 CT abdomen pelvis. Disc levels: T12-L1: No significant disc bulge. No spinal canal stenosis or neural foraminal narrowing. L1-L2: Left foraminal and extreme lateral disc protrusion. No spinal canal stenosis. Mild left neural foraminal narrowing. L2-L3: Mild to moderate disc bulge. No spinal canal stenosis. Mild bilateral neural foraminal narrowing. L3-L4: Minimal disc bulge. Facet arthropathy. No spinal canal stenosis. Mild bilateral neural foraminal narrowing. L4-L5: Grade 1 anterolisthesis with disc unroofing. Severe facet arthropathy. No spinal canal stenosis.  Moderate bilateral neural foraminal narrowing. L5-S1: Mild disc bulge. Severe right and mild left facet arthropathy. No spinal canal stenosis. Mild left and moderate right neural foraminal narrowing. IMPRESSION: 1. No acute fracture or traumatic listhesis. 2. Multilevel degenerative changes, as described above. Electronically Signed   By: Wiliam Ke M.D.   On: 03/26/2023 00:33    Antibiotics:  Anti-infectives (From admission, onward)    Start     Dose/Rate Route Frequency Ordered Stop   04/04/23 1830  ceFAZolin (ANCEF) IVPB 2g/100 mL premix        2 g 200 mL/hr over 30 Minutes Intravenous Every 8 hours 04/04/23 1336 04/05/23 0622   04/04/23 0845  ceFAZolin (ANCEF) IVPB 2g/100 mL premix        2 g 200 mL/hr over 30 Minutes Intravenous On call to O.R. 04/04/23 0840 04/04/23 1033   04/04/23 0845  ceFAZolin (ANCEF) 2-4 GM/100ML-% IVPB       Note to Pharmacy: Darrick Huntsman: cabinet override      04/04/23 0845 04/04/23 1832       Discharge Exam: Blood pressure 110/69, pulse 61, temperature 97.7 F (36.5 C), temperature source Oral, resp. rate 18, height 5\' 2"  (1.575 m), weight 77.6 kg, SpO2 94%. 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   Other    Other reaction(s): Other (See Comments) Z-pak causes EKG changes, was told by cardiologist not to take this.   Erythromycin Base Rash   [Derm] rash   Fesoterodine Palpitations   Sulfacetamide Sodium Rash   Tape Other (See Comments)   Adhesive tape And Steri Strips; Local reaction-burns skin    Toviaz [fesoterodine Fumarate Er] Palpitations        Medication List     STOP taking these medications    ibuprofen 600 MG tablet Commonly known as: ADVIL   Medrol 4 MG Tbpk tablet Generic drug: methylPREDNISolone   Nasonex 50 MCG/ACT nasal spray Generic drug: mometasone       TAKE these medications    albuterol 108 (90 Base) MCG/ACT inhaler Commonly known as: VENTOLIN HFA Inhale 2 puffs by mouth into the lungs every 6 (six) hours as needed for wheezing or shortness of breath.   amLODipine 5 MG tablet Commonly known as: NORVASC Take 1 tablet (5 mg total) by mouth daily.   amoxicillin 500 MG capsule Commonly known as: AMOXIL Take 2,000 mg by mouth once.   aspirin 81 MG chewable tablet Chew 81 mg by mouth daily.   atorvastatin 80 MG tablet Commonly known as: LIPITOR Take 1 tablet (80 mg total) by mouth daily.   Azelastine HCl 137 MCG/SPRAY Soln Place 2 sprays into both nostrils daily as needed (Congestion).   ciprofloxacin 500 MG tablet Commonly known as: CIPRO Take 500 mg by mouth as needed (Bladder infections).   diphenhydrAMINE 50 MG capsule Commonly known as: BENADRYL Take 1 capsule by mouth as needed for itching (for side effect for Ocrevus).   DULoxetine 60 MG capsule Commonly known as: CYMBALTA Take 1 capsule by mouth daily.   esomeprazole 40 MG capsule Commonly known as: NEXIUM Take 1 capsule (40 mg total) by mouth daily at 12 noon. What changed: when to take this   ezetimibe 10 MG tablet Commonly known as: ZETIA Take 1 tablet (10 mg total) by mouth daily.   ferrous gluconate 324 MG tablet Commonly known as: FERGON Take 324 mg by mouth daily with breakfast.   furosemide 20 MG tablet Commonly known as: LASIX Take 2 tablets (40 mg total) by mouth daily. What changed:  how much to take when to take this   gabapentin 100 MG capsule Commonly known as: NEURONTIN Take 1 capsule (100 mg total) by mouth at bedtime. What changed:  when  to take this reasons to take this   linaclotide 145 MCG Caps capsule Commonly known as: LINZESS Take 145 mcg by mouth daily before breakfast.   losartan 100 MG tablet Commonly known as: COZAAR Take 1 tablet (100 mg total) by mouth daily.   methocarbamol 500 MG tablet Commonly known as: ROBAXIN Take 1 tablet (500 mg total) by mouth every 6 (six) hours as needed for muscle spasms.   methylphenidate 20 MG tablet Commonly known as: RITALIN Take 1 tablet (20 mg total) by mouth daily. At noon What changed: additional instructions   metoprolol succinate 50 MG 24 hr tablet Commonly known as: TOPROL-XL Take 1 tablet (50 mg total) by mouth daily. Take with or immediately following a meal.   mirabegron ER 50 MG Tb24 tablet Commonly known as: MYRBETRIQ Take 50 mg by mouth every morning.   ocrelizumab 600 mg in sodium chloride 0.9 % 500 mL Inject 600 mg into the vein every 6 (six) months.   Oxycodone HCl 10 MG  Tabs Take 0.5 tablets (5 mg total) by mouth every 4 (four) hours as needed for severe pain (pain score 7-10).   potassium chloride SA 20 MEQ tablet Commonly known as: Klor-Con M20 Take 2 tablets (40 mEq total) by mouth daily.   pramipexole 0.25 MG tablet Commonly known as: MIRAPEX Take 1 tablet (0.25 mg total) by mouth at bedtime.   traMADol 50 MG tablet Commonly known as: ULTRAM Take 100 mg by mouth every 6 (six) hours as needed for moderate pain (pain score 4-6).   Vitamin D-3 125 MCG (5000 UT) Tabs Take 5,000 Units by mouth daily.               Durable Medical Equipment  (From admission, onward)           Start     Ordered   04/05/23 0650  DME 3-in-1  Once        04/05/23 0654   04/05/23 0650  DME Walker  Once       Question Answer Comment  Walker: With 5 Inch Wheels   Patient needs a walker to treat with the following condition Gait instability      04/05/23 0654   04/04/23 1337  DME Walker rolling  Once       Question:  Patient needs a walker to  treat with the following condition  Answer:  S/P lumbar fusion   04/04/23 1336   04/04/23 1337  DME 3 n 1  Once        04/04/23 1336            Disposition: Home   Final Dx: L4-5 fusion  Discharge Instructions      Remove dressing in 72 hours   Complete by: As directed    Call MD for:  difficulty breathing, headache or visual disturbances   Complete by: As directed    Call MD for:  persistant nausea and vomiting   Complete by: As directed    Call MD for:  redness, tenderness, or signs of infection (pain, swelling, redness, odor or green/yellow discharge around incision site)   Complete by: As directed    Call MD for:  severe uncontrolled pain   Complete by: As directed    Call MD for:  temperature >100.4   Complete by: As directed    Diet - low sodium heart healthy   Complete by: As directed    Increase activity slowly   Complete by: As directed           Signed: Tia Alert 04/05/2023, 6:55 AM

## 2023-04-06 ENCOUNTER — Other Ambulatory Visit (HOSPITAL_COMMUNITY): Payer: Self-pay

## 2023-04-13 ENCOUNTER — Other Ambulatory Visit: Payer: Self-pay

## 2023-04-13 ENCOUNTER — Emergency Department (HOSPITAL_COMMUNITY)
Admission: EM | Admit: 2023-04-13 | Discharge: 2023-04-13 | Disposition: A | Discharge status: Home / Self Care | Payer: Medicare PPO | Attending: Emergency Medicine | Admitting: Emergency Medicine

## 2023-04-13 ENCOUNTER — Emergency Department (HOSPITAL_COMMUNITY): Payer: Medicare PPO

## 2023-04-13 ENCOUNTER — Encounter (HOSPITAL_COMMUNITY): Payer: Self-pay

## 2023-04-13 DIAGNOSIS — W19XXXA Unspecified fall, initial encounter: Secondary | ICD-10-CM

## 2023-04-13 DIAGNOSIS — Z7982 Long term (current) use of aspirin: Secondary | ICD-10-CM | POA: Diagnosis not present

## 2023-04-13 DIAGNOSIS — Z7951 Long term (current) use of inhaled steroids: Secondary | ICD-10-CM | POA: Diagnosis not present

## 2023-04-13 DIAGNOSIS — S22080A Wedge compression fracture of T11-T12 vertebra, initial encounter for closed fracture: Secondary | ICD-10-CM | POA: Diagnosis not present

## 2023-04-13 DIAGNOSIS — I2510 Atherosclerotic heart disease of native coronary artery without angina pectoris: Secondary | ICD-10-CM | POA: Diagnosis not present

## 2023-04-13 DIAGNOSIS — I10 Essential (primary) hypertension: Secondary | ICD-10-CM | POA: Diagnosis not present

## 2023-04-13 DIAGNOSIS — J449 Chronic obstructive pulmonary disease, unspecified: Secondary | ICD-10-CM | POA: Diagnosis not present

## 2023-04-13 DIAGNOSIS — W06XXXA Fall from bed, initial encounter: Secondary | ICD-10-CM | POA: Insufficient documentation

## 2023-04-13 DIAGNOSIS — S9002XA Contusion of left ankle, initial encounter: Secondary | ICD-10-CM | POA: Diagnosis not present

## 2023-04-13 DIAGNOSIS — Z79899 Other long term (current) drug therapy: Secondary | ICD-10-CM | POA: Insufficient documentation

## 2023-04-13 DIAGNOSIS — S3992XA Unspecified injury of lower back, initial encounter: Secondary | ICD-10-CM | POA: Diagnosis present

## 2023-04-13 LAB — CBC WITH DIFFERENTIAL/PLATELET
Abs Immature Granulocytes: 0.04 10*3/uL (ref 0.00–0.07)
Basophils Absolute: 0 10*3/uL (ref 0.0–0.1)
Basophils Relative: 0 %
Eosinophils Absolute: 0.1 10*3/uL (ref 0.0–0.5)
Eosinophils Relative: 1 %
HCT: 39.9 % (ref 36.0–46.0)
Hemoglobin: 13.4 g/dL (ref 12.0–15.0)
Immature Granulocytes: 0 %
Lymphocytes Relative: 23 %
Lymphs Abs: 2.6 10*3/uL (ref 0.7–4.0)
MCH: 31.2 pg (ref 26.0–34.0)
MCHC: 33.6 g/dL (ref 30.0–36.0)
MCV: 92.8 fL (ref 80.0–100.0)
Monocytes Absolute: 1 10*3/uL (ref 0.1–1.0)
Monocytes Relative: 9 %
Neutro Abs: 7.6 10*3/uL (ref 1.7–7.7)
Neutrophils Relative %: 67 %
Platelets: 608 10*3/uL — ABNORMAL HIGH (ref 150–400)
RBC: 4.3 MIL/uL (ref 3.87–5.11)
RDW: 13.6 % (ref 11.5–15.5)
WBC: 11.4 10*3/uL — ABNORMAL HIGH (ref 4.0–10.5)
nRBC: 0 % (ref 0.0–0.2)

## 2023-04-13 LAB — BASIC METABOLIC PANEL
Anion gap: 12 (ref 5–15)
BUN: 11 mg/dL (ref 6–20)
CO2: 24 mmol/L (ref 22–32)
Calcium: 9.4 mg/dL (ref 8.9–10.3)
Chloride: 102 mmol/L (ref 98–111)
Creatinine, Ser: 1.25 mg/dL — ABNORMAL HIGH (ref 0.44–1.00)
GFR, Estimated: 49 mL/min — ABNORMAL LOW (ref >60)
Glucose, Bld: 91 mg/dL (ref 70–99)
Potassium: 4.3 mmol/L (ref 3.5–5.1)
Sodium: 138 mmol/L (ref 135–145)

## 2023-04-13 NOTE — ED Provider Notes (Signed)
[place] EMERGENCY DEPARTMENT AT [facility] Provider Note   CSN: [id number] Arrival date & time: 04/13/23  1214     History  No chief complaint on file.   Summer Henderson is a 62 y.o. female.  Pt is a 62 yo female with pmhx significant for MS, cervical dysplasia, GERD, HTN, arthritis, CAD, RLS, fibromyalgia, copd, and recent lumbar laminectomy and fusion/fixation on 11/27.  She was trying to get out of bed and thought she had more room when she rolled off the bed.  Her bed is about 4 feet high.  She does have carpet, but that is over concrete.  She did not hit her head.  No loc.  He has pain in her low back and in her left ankle.  She was able to get up on her own.  She did take oxycodone, ibuprofen, and a muscle relaxer pta.          Home Medications Prior to Admission medications   Medication Sig Start Date End Date Taking? Authorizing Provider  albuterol (VENTOLIN HFA) 108 (90 Base) MCG/ACT inhaler Inhale 2 puffs by mouth into the lungs every 6 (six) hours as needed for wheezing or shortness of breath. 08/09/21   Vanetta Mulders, MD  amLODipine (NORVASC) 5 MG tablet Take 1 tablet (5 mg total) by mouth daily. 09/26/22   Lewayne Bunting, MD  amoxicillin (AMOXIL) 500 MG capsule Take 2,000 mg by mouth once. 03/14/23   [provider]  aspirin 81 MG chewable tablet Chew 81 mg by mouth daily.    [provider]  atorvastatin (LIPITOR) 80 MG tablet Take 1 tablet (80 mg total) by mouth daily. 09/29/22   Lewayne Bunting, MD  Azelastine HCl 137 MCG/SPRAY SOLN Place 2 sprays into both nostrils daily as needed (Congestion). 08/11/21   [provider]  Cholecalciferol (VITAMIN D-3) 125 MCG (5000 UT) TABS Take 5,000 Units by mouth daily.    [provider]  ciprofloxacin (CIPRO) 500 MG tablet Take 500 mg by mouth as needed (Bladder infections). 03/06/23   [provider]  diphenhydrAMINE (BENADRYL) 50 MG capsule Take 1 capsule by mouth as  needed for itching (for side effect for Ocrevus). 05/17/21   [provider]  DULoxetine (CYMBALTA) 60 MG capsule Take 1 capsule by mouth daily. 03/02/23 03/01/24  [provider]  esomeprazole (NEXIUM) 40 MG capsule Take 1 capsule (40 mg total) by mouth daily at 12 noon. Patient taking differently: Take 40 mg by mouth daily. 01/24/23   Lewayne Bunting, MD  ezetimibe (ZETIA) 10 MG tablet Take 1 tablet (10 mg total) by mouth daily. 08/21/22   Lewayne Bunting, MD  ferrous gluconate (FERGON) 324 MG tablet Take 324 mg by mouth daily with breakfast. 07/25/21   [provider]  furosemide (LASIX) 20 MG tablet Take 2 tablets (40 mg total) by mouth daily. Patient taking differently: Take 20 mg by mouth 2 (two) times daily. 09/29/22   Lewayne Bunting, MD  gabapentin (NEURONTIN) 100 MG capsule Take 1 capsule (100 mg total) by mouth at bedtime. Patient taking differently: Take 100 mg by mouth at bedtime as needed (Nerve pain). 07/13/22   Glean Salvo, NP  linaclotide Karlene Einstein) 145 MCG CAPS capsule Take 145 mcg by mouth daily before breakfast.    [provider]  losartan (COZAAR) 100 MG tablet Take 1 tablet (100 mg total) by mouth daily. 10/03/22   Lewayne Bunting, MD  methocarbamol (ROBAXIN) 500 MG  tablet Take 1 tablet (500 mg total) by mouth every 6 (six) hours as needed for muscle spasms. 04/05/23   Meyran, Tiana Loft, NP  methylphenidate (RITALIN) 20 MG tablet Take 1 tablet (20 mg total) by mouth daily. At noon Patient taking differently: Take 20 mg by mouth daily. 03/19/23   Glean Salvo, NP  metoprolol succinate (TOPROL-XL) 50 MG 24 hr tablet Take 1 tablet (50 mg total) by mouth daily. Take with or immediately following a meal. 12/27/22   Crenshaw, Madolyn Frieze, MD  mirabegron ER (MYRBETRIQ) 50 MG TB24 tablet Take 50 mg by mouth every morning. 07/25/20   [provider]  ocrelizumab 600 mg in sodium chloride 0.9 % 500 mL Inject 600 mg into the vein every 6  (six) months.    [provider]  Oxycodone HCl 10 MG TABS Take 0.5 tablets (5 mg total) by mouth every 4 (four) hours as needed. 04/05/23   Meyran, Tiana Loft, NP  potassium chloride SA (KLOR-CON M20) 20 MEQ tablet Take 2 tablets (40 mEq total) by mouth daily. 04/03/23 07/02/23  Lewayne Bunting, MD  pramipexole (MIRAPEX) 0.25 MG tablet Take 1 tablet (0.25 mg total) by mouth at bedtime. 07/13/22   Glean Salvo, NP  traMADol (ULTRAM) 50 MG tablet Take 100 mg by mouth every 6 (six) hours as needed for moderate pain (pain score 4-6).    [provider]      Allergies    Demerol [meperidine], Erythromycin, Morphine and codeine, Sulfa antibiotics, Diflucan [fluconazole], Other, Erythromycin base, Fesoterodine, Sulfacetamide sodium, Tape, and Toviaz [fesoterodine fumarate er]    Review of Systems   Review of Systems  Musculoskeletal:  Positive for back pain.       Left ankle pain  All other systems reviewed and are negative.   Physical Exam Updated Vital Signs BP 107/69   Pulse 75   Temp 99 F (37.2 C) (Oral)   Resp 16   SpO2 99%  Physical Exam Vitals and nursing note reviewed.  Constitutional:      Appearance: Normal appearance.  HENT:     Head: Normocephalic and atraumatic.     Right Ear: External ear normal.     Left Ear: External ear normal.     Nose: Nose normal.     Mouth/Throat:     Mouth: Mucous membranes are moist.     Pharynx: Oropharynx is clear.  Eyes:     Extraocular Movements: Extraocular movements intact.     Conjunctiva/sclera: Conjunctivae normal.     Pupils: Pupils are equal, round, and reactive to light.  Cardiovascular:     Rate and Rhythm: Normal rate and regular rhythm.     Pulses: Normal pulses.     Heart sounds: Normal heart sounds.  Pulmonary:     Effort: Pulmonary effort is normal.     Breath sounds: Normal breath sounds.  Abdominal:     General: Abdomen is flat. Bowel sounds are normal.     Palpations: Abdomen is soft.   Musculoskeletal:     Cervical back: Normal range of motion and neck supple.     Comments: Mild bruising to left ankle.  Small abrasion.  Skin:    General: Skin is warm.     Capillary Refill: Capillary refill takes less than 2 seconds.  Neurological:     General: No focal deficit present.     Mental Status: She is alert and oriented to person, place, and time.  Psychiatric:  Mood and Affect: Mood normal.        Behavior: Behavior normal.     ED Results / Procedures / Treatments   Labs (all labs ordered are listed, but only abnormal results are displayed) Labs Reviewed  BASIC METABOLIC PANEL - Abnormal; Notable for the following components:      Result Value   Creatinine, Ser 1.25 (*)    GFR, Estimated 49 (*)    All other components within normal limits  CBC WITH DIFFERENTIAL/PLATELET - Abnormal; Notable for the following components:   WBC 11.4 (*)    Platelets 608 (*)    All other components within normal limits    EKG None  Radiology DG Lumbar Spine Complete  Result Date: 04/13/2023 CLINICAL DATA:  Unwitnessed fall.  Pain. EXAM: LUMBAR SPINE - COMPLETE 4+ VIEW COMPARISON:  None Available. FINDINGS: There are 5 nonrib-bearing lumbar vertebrae. There is mild loss of lumbar lordosis. Patient is status post posterior spinal fusion of L4-5 with transpedicular screws, rods and intervertebral disc spacer. The hardware is intact. No spondylolysis or spondylolisthesis. There is age indeterminate mild compression deformity of T11 vertebral body, new since the prior study from 2009. There is no significant retropulsion or spinal canal compromise. Correlate clinically to determine the chronicity. Remaining vertebral body heights are maintained. No aggressive osseous lesion. Mild-moderate multilevel degenerative changes in the form of reduced intervertebral disc height, endplate sclerosis/irregularity, facet arthropathy and marginal osteophyte formation. Sacroiliac joints are  symmetric. Visualized soft tissues are within normal limits. IMPRESSION: *Age indeterminate mild compression deformity of T11 vertebral body. No significant retropulsion or spinal canal compromise. *Mild-to-moderate multilevel degenerative changes of the lumbar spine. Electronically Signed   By: Jules Schick M.D.   On: 04/13/2023 14:38   DG Ankle Complete Left  Result Date: 04/13/2023 CLINICAL DATA:  Unwitnessed fall.  Left ankle pain. EXAM: LEFT ANKLE COMPLETE - 3+ VIEW COMPARISON:  None Available. FINDINGS: No acute fracture or dislocation. No aggressive osseous lesion. Ankle mortise appears intact. Calcaneal spur noted along the Plantar aponeurosis attachment site. No focal soft tissue swelling. No radiopaque foreign bodies. IMPRESSION: *No acute osseous abnormality of the left ankle joint. Electronically Signed   By: Jules Schick M.D.   On: 04/13/2023 14:35    Procedures Procedures    Medications Ordered in ED Medications - No data to display  ED Course/ Medical Decision Making/ A&P                                 Medical Decision Making Amount and/or Complexity of Data Reviewed Labs: ordered. Radiology: ordered.   This patient presents to the ED for concern of fall, this involves an extensive number of treatment options, and is a complaint that carries with it a high risk of complications and morbidity.  The differential diagnosis includes multiple trauma   Co morbidities that complicate the patient evaluation  MS, cervical dysplasia, GERD, HTN, arthritis, CAD, RLS, fibromyalgia, copd, and recent lumbar laminectomy and fusion/fixation on 11/27   Additional history obtained:  Additional history obtained from epic chart review  Lab Tests:  I Ordered, and personally interpreted labs.  The pertinent results include:  cbc nl, bmp with cr elevated at 1.25 (0.85 last week)   Imaging Studies ordered:  I ordered imaging studies including left ankle, back  I independently  visualized and interpreted imaging which showed  L ankle: No acute osseous abnormality of the left ankle joint.  Lumbar:  I agree with the radiologist interpretation   Medicines ordered and prescription drug management:   I have reviewed the patients home medicines and have made adjustments as needed   Test Considered:  xr   Problem List / ED Course:  Fall:  ? T11 fx.  No recent images.  Pain is lower, so I suspect this is old.  Pt is ambulatory.  She is stable for d/c.  Return if worse.  F/u with pcp/ns.   Reevaluation:  After the interventions noted above, I reevaluated the patient and found that they have :improved   Social Determinants of Health:  Lives at home   Dispostion:  After consideration of the diagnostic results and the patients response to treatment, I feel that the patent would benefit from discharge with outpatient f/u.          Final Clinical Impression(s) / ED Diagnoses Final diagnoses:  Fall, initial encounter  Closed wedge compression fracture of T11 vertebra, initial encounter [facility])    Rx / DC Orders ED Discharge Orders     None         Jacalyn Lefevre, MD 04/13/23 1534

## 2023-04-13 NOTE — ED Triage Notes (Signed)
Pt bib ems form home; unwitnessed mechanical fall, face first onto Rolator, approx 4 feet high; c/o lower pain to back , hx lumbar fusion; swelling and abrasion to L ankle; no deformity noted; denies loc, denies hitting head; cbg 132; pt not on thinners; rr 20, 136/74, 97% o2 RA, 96 P; pt took oxycodone, ibuprofen, and a muscle relaxer PTA

## 2023-04-13 NOTE — ED Notes (Signed)
Patient verbalizes understanding of discharge instructions. Opportunity for questioning and answers were provided. Pt discharged from ED. 

## 2023-04-18 ENCOUNTER — Telehealth: Payer: Self-pay | Admitting: Cardiology

## 2023-04-18 DIAGNOSIS — E876 Hypokalemia: Secondary | ICD-10-CM

## 2023-04-18 NOTE — Telephone Encounter (Signed)
Pt wants to know if Dr Jens Som has looked at labs from the hospital and what her dosage should be on her potassium. Please advise

## 2023-04-18 NOTE — Telephone Encounter (Signed)
Called and spoke to patient who is calling about her Potassium. Patient states she was scheduled to have her Potassium level checked an ended up in the hospital from a fall. Her potassium level was low and her Klor-Con was increased to 40 mg daily. She stated when she left the hospital her Potassium level was back to normal. She asked if she should go back to her regular dose of 10 mg daily or continue taking 40 mg daily.

## 2023-04-19 ENCOUNTER — Other Ambulatory Visit: Payer: Self-pay | Admitting: *Deleted

## 2023-04-19 DIAGNOSIS — E876 Hypokalemia: Secondary | ICD-10-CM

## 2023-04-19 NOTE — Telephone Encounter (Signed)
Spoke with pt, aware of Dr Ludwig Clarks recommendations. Lab orders mailed to the pt   Continue kcl 40 meq daily; bmet one week from recent hospital DC  [facility]

## 2023-04-21 ENCOUNTER — Other Ambulatory Visit: Payer: Self-pay

## 2023-04-21 ENCOUNTER — Emergency Department (HOSPITAL_BASED_OUTPATIENT_CLINIC_OR_DEPARTMENT_OTHER)
Admission: EM | Admit: 2023-04-21 | Discharge: 2023-04-21 | Disposition: A | Discharge status: Home / Self Care | Payer: Medicare PPO | Attending: Emergency Medicine | Admitting: Emergency Medicine

## 2023-04-21 ENCOUNTER — Encounter (HOSPITAL_BASED_OUTPATIENT_CLINIC_OR_DEPARTMENT_OTHER): Payer: Self-pay | Admitting: Urology

## 2023-04-21 DIAGNOSIS — M25572 Pain in left ankle and joints of left foot: Secondary | ICD-10-CM | POA: Diagnosis not present

## 2023-04-21 DIAGNOSIS — R2242 Localized swelling, mass and lump, left lower limb: Secondary | ICD-10-CM | POA: Insufficient documentation

## 2023-04-21 DIAGNOSIS — M25472 Effusion, left ankle: Secondary | ICD-10-CM

## 2023-04-21 NOTE — ED Triage Notes (Signed)
Pt ambulatory to Triage with walker  Pt states left ankle swelling that started on 12/6 after falling out of bed, had xr of ankle then States swelling has gotten worse   H/o lumbar fusion on 11/27

## 2023-04-21 NOTE — ED Notes (Signed)
Pt refused Ace wrap

## 2023-04-21 NOTE — ED Provider Notes (Signed)
[place] EMERGENCY DEPARTMENT AT [facility] Provider Note   CSN: [id number] Arrival date & time: 04/21/23  1444     History Chief Complaint  Patient presents with   Joint Swelling    HPI Summer Henderson is a 62 y.o. female presenting for 10 days of ankle pain.  Seen on the seventh after a fall where she fell out of bed.  She has a scratch/abrasion on her left anterior shin.  Has been on Amoxil over the last 3 days because of some redness that developed around it but she is still having some pain and swelling at site. She denies fevers chills nausea vomiting shortness of breath no acute distress.  Is concerned that her hematoma appears to have spread out over her leg.   Patient's recorded medical, surgical, social, medication list and allergies were reviewed in the Snapshot window as part of the initial history.   Review of Systems   Review of Systems  Constitutional:  Negative for chills and fever.  HENT:  Negative for ear pain and sore throat.   Eyes:  Negative for pain and visual disturbance.  Respiratory:  Negative for cough and shortness of breath.   Cardiovascular:  Negative for chest pain and palpitations.  Gastrointestinal:  Negative for abdominal pain and vomiting.  Genitourinary:  Negative for dysuria and hematuria.  Musculoskeletal:  Negative for arthralgias and back pain.  Skin:  Negative for color change and rash.  Neurological:  Negative for seizures and syncope.  All other systems reviewed and are negative.   Physical Exam Updated Vital Signs BP 120/87 (BP Location: Left Arm)   Pulse 82   Temp 98.1 F (36.7 C) (Oral)   Resp 16   Ht 5\' 2"  (1.575 m)   Wt 77.6 kg   SpO2 93%   BMI 31.29 kg/m  Physical Exam Vitals and nursing note reviewed.  Constitutional:      General: She is not in acute distress.    Appearance: She is well-developed.  HENT:     Head: Normocephalic and atraumatic.  Eyes:     Conjunctiva/sclera: Conjunctivae normal.   Cardiovascular:     Rate and Rhythm: Normal rate and regular rhythm.     Heart sounds: No murmur heard. Pulmonary:     Effort: Pulmonary effort is normal. No respiratory distress.     Breath sounds: Normal breath sounds.  Abdominal:     General: There is no distension.     Palpations: Abdomen is soft.     Tenderness: There is no abdominal tenderness. There is no right CVA tenderness or left CVA tenderness.  Musculoskeletal:        General: No swelling or tenderness. Normal range of motion.     Cervical back: Neck supple.     Right lower leg: No edema.     Left lower leg: No edema.  Skin:    General: Skin is warm and dry.     Findings: Erythema (Approximately 0.5 cm of erythema circumscribing the left leg abrasion.) present.  Neurological:     General: No focal deficit present.     Mental Status: She is alert and oriented to person, place, and time. Mental status is at baseline.     Cranial Nerves: No cranial nerve deficit.      ED Course/ Medical Decision Making/ A&P Clinical Course as of 04/21/23 1522  Sat Apr 21, 2023  1513 Fall on 12/7 Had XR of back and ankle due to recent back  surgery Found a [CC]    Clinical Course User Index [CC] Glyn Ade, MD    Procedures Procedures   Medications Ordered in ED Medications - No data to display  Medical Decision Making:   Patient presenting with left ankle pain and swelling.  She is already had x-rays that ruled out orthopedic injury.  She is ambulatory in no acute distress.  There is a trace amount of swelling in the left lower extremity but does not pit.  Per prior notes she had a hematoma at the site after her last injury that likely has distributed.  I discussed supportive care for her symptoms.  She is already on Amoxil therefore I do not think that broadening her antibiotics would be beneficial at this time as she does not have signs of systemic illness. She is in no acute distress stable for outpatient care  management follow-up with PCP for reassessment.  Disposition:  I have considered need for hospitalization, however, considering all of the above, I believe this patient is stable for discharge at this time.  Patient/family educated about specific return precautions for given chief complaint and symptoms.  Patient/family educated about follow-up with PC.     Patient/family expressed understanding of return precautions and need for follow-up. Patient spoken to regarding all imaging and laboratory results and appropriate follow up for these results. All education provided in verbal form with additional information in written form. Time was allowed for answering of patient questions. Patient discharged.    Emergency Department Medication Summary:   Medications - No data to display      Clinical Impression:  1. Acute left ankle pain   2. Left ankle swelling      Discharge   Final Clinical Impression(s) / ED Diagnoses Final diagnoses:  Acute left ankle pain  Left ankle swelling    Rx / DC Orders ED Discharge Orders     None         Glyn Ade, MD 04/21/23 1524

## 2023-05-10 ENCOUNTER — Encounter: Payer: Self-pay | Admitting: Neurology

## 2023-05-10 ENCOUNTER — Other Ambulatory Visit: Payer: Self-pay | Admitting: Neurology

## 2023-05-10 DIAGNOSIS — G35 Multiple sclerosis: Secondary | ICD-10-CM

## 2023-05-10 MED ORDER — METHYLPHENIDATE HCL 20 MG PO TABS
20.0000 mg | ORAL_TABLET | Freq: Every day | ORAL | 0 refills | Status: DC
Start: 2023-05-10 — End: 2023-06-12

## 2023-05-22 ENCOUNTER — Other Ambulatory Visit (HOSPITAL_BASED_OUTPATIENT_CLINIC_OR_DEPARTMENT_OTHER): Payer: Self-pay | Admitting: Student

## 2023-05-22 DIAGNOSIS — M4316 Spondylolisthesis, lumbar region: Secondary | ICD-10-CM

## 2023-06-03 ENCOUNTER — Ambulatory Visit (HOSPITAL_BASED_OUTPATIENT_CLINIC_OR_DEPARTMENT_OTHER): Payer: Medicare PPO

## 2023-06-10 ENCOUNTER — Ambulatory Visit (HOSPITAL_BASED_OUTPATIENT_CLINIC_OR_DEPARTMENT_OTHER)
Admission: RE | Admit: 2023-06-10 | Discharge: 2023-06-10 | Disposition: A | Discharge status: Home / Self Care | Payer: Medicare PPO | Source: Ambulatory Visit | Attending: Student | Admitting: Student

## 2023-06-10 DIAGNOSIS — M4316 Spondylolisthesis, lumbar region: Secondary | ICD-10-CM | POA: Insufficient documentation

## 2023-06-12 ENCOUNTER — Encounter: Payer: Self-pay | Admitting: Neurology

## 2023-06-12 ENCOUNTER — Other Ambulatory Visit: Payer: Self-pay

## 2023-06-12 DIAGNOSIS — G35 Multiple sclerosis: Secondary | ICD-10-CM

## 2023-06-12 MED ORDER — METHYLPHENIDATE HCL 20 MG PO TABS
20.0000 mg | ORAL_TABLET | Freq: Every day | ORAL | 0 refills | Status: DC
Start: 2023-06-12 — End: 2023-07-26

## 2023-06-12 NOTE — Telephone Encounter (Signed)
 Last seen 01/29/23, next appt scheduled3/24/25 Dispenses   Dispensed Days Supply Quantity Provider Pharmacy  methylphenidate  20 mg tablet 05/10/2023 30 30 tablet Dohmeier, Dedra, MD [facility] [phone] [place]...  methylphenidate  20 mg tablet 03/19/2023 30 30 tablet Gayland Lauraine PARAS, NP [facility] [phone] [place]...  methylphenidate  20 mg tablet 02/13/2023 30 30 tablet Penumalli, Vikram R, MD [facility] [phone] [place]...  methylphenidate  20 mg tablet 01/15/2023 30 30 tablet Gayland Lauraine PARAS, NP [facility] [phone] [place]  methylphenidate  20 mg tablet 12/13/2022 30 30 tablet Gayland Lauraine PARAS, NP [facility] [phone] [place]...  methylphenidate  20 mg tablet 11/13/2022 30 30 tablet Gayland Lauraine PARAS, NP [facility] [phone] [place]...  methylphenidate  20 mg tablet 10/12/2022 30 30 tablet Gayland Lauraine PARAS, NP [facility] [phone] [place]...  methylphenidate  20 mg tablet 09/11/2022 30 30 tablet Vear Charlie LABOR, MD [facility] [phone] [place]...  methylphenidate  20 mg tablet 08/09/2022 30 30 tablet Rush Nest, MD [facility] [phone] [place]...  methylphenidate  20 mg tablet 07/10/2022 30 30 tablet Gayland Lauraine PARAS, NP [facility] [phone] [place]..SABRA

## 2023-06-27 ENCOUNTER — Telehealth: Payer: Self-pay

## 2023-06-27 NOTE — Telephone Encounter (Signed)
Renewal notification fax received- form has been attached to patient media files.

## 2023-07-12 ENCOUNTER — Telehealth: Payer: Self-pay | Admitting: *Deleted

## 2023-07-12 NOTE — Telephone Encounter (Signed)
 Received fax from the below, office note and labs from visit on 01/29/23 to [phone]. Confirmation received.

## 2023-07-22 ENCOUNTER — Other Ambulatory Visit: Payer: Self-pay | Admitting: Neurology

## 2023-07-23 ENCOUNTER — Other Ambulatory Visit: Payer: Self-pay

## 2023-07-23 ENCOUNTER — Telehealth: Payer: Self-pay | Admitting: Neurology

## 2023-07-23 DIAGNOSIS — R601 Generalized edema: Secondary | ICD-10-CM

## 2023-07-23 DIAGNOSIS — E785 Hyperlipidemia, unspecified: Secondary | ICD-10-CM

## 2023-07-23 MED ORDER — FUROSEMIDE 20 MG PO TABS: 40.0000 mg | ORAL_TABLET | Freq: Every day | ORAL | 2 refills | Status: AC

## 2023-07-23 MED ORDER — AMLODIPINE BESYLATE 5 MG PO TABS
5.0000 mg | ORAL_TABLET | Freq: Every day | ORAL | 2 refills | Status: AC
Start: 2023-07-23 — End: ?

## 2023-07-23 MED ORDER — EZETIMIBE 10 MG PO TABS: 10.0000 mg | ORAL_TABLET | Freq: Every day | ORAL | 2 refills | Status: DC

## 2023-07-23 MED ORDER — LOSARTAN POTASSIUM 100 MG PO TABS: 100.0000 mg | ORAL_TABLET | Freq: Every day | ORAL | 2 refills | Status: AC

## 2023-07-23 NOTE — Telephone Encounter (Signed)
 Tammy @[facility] has called requesting RN to go into CMM to complete PA request KEY#[id number]

## 2023-07-24 NOTE — Telephone Encounter (Signed)
 PA approved until 05/07/2024 through [facility]

## 2023-07-24 NOTE — Telephone Encounter (Signed)
 I do not do PA's for infusions. Thanks.

## 2023-07-24 NOTE — Telephone Encounter (Signed)
Pa sent

## 2023-07-26 ENCOUNTER — Encounter: Payer: Self-pay | Admitting: Neurology

## 2023-07-26 DIAGNOSIS — G35 Multiple sclerosis: Secondary | ICD-10-CM

## 2023-07-26 MED ORDER — METHYLPHENIDATE HCL 20 MG PO TABS
20.0000 mg | ORAL_TABLET | Freq: Every day | ORAL | 0 refills | Status: DC
Start: 2023-07-26 — End: 2023-08-29

## 2023-07-30 ENCOUNTER — Ambulatory Visit: Payer: Medicare PPO | Admitting: Diagnostic Neuroimaging

## 2023-07-30 ENCOUNTER — Encounter: Payer: Self-pay | Admitting: Diagnostic Neuroimaging

## 2023-08-03 ENCOUNTER — Encounter: Payer: Self-pay | Admitting: Diagnostic Neuroimaging

## 2023-08-06 ENCOUNTER — Telehealth: Payer: Self-pay | Admitting: Cardiology

## 2023-08-06 DIAGNOSIS — E876 Hypokalemia: Secondary | ICD-10-CM

## 2023-08-06 MED ORDER — POTASSIUM CHLORIDE CRYS ER 20 MEQ PO TBCR: 40.0000 meq | EXTENDED_RELEASE_TABLET | Freq: Every day | ORAL | 3 refills | Status: AC

## 2023-08-06 NOTE — Telephone Encounter (Signed)
 Patient would like to inform Dr. Jens Som and his nurse that she was recently seen in the ED at [facility] in [facility]. Please advise.

## 2023-08-06 NOTE — Telephone Encounter (Signed)
 Tammy from [facility] called wanting to get an update of the PA for the pt's [facility]. Please advise. [phone]

## 2023-08-06 NOTE — Telephone Encounter (Signed)
 Spoke with pt, aware all the labs from the ER looks good but her potassium was low again. She reports she has only been taking 1 potassium pill a day because she ran out of pills and the script was too early to refill. She will return to taking 2 potassium pills daily and will have lab work checked again in 2 weeks. She reports she is feeling better now and has a follow up appointment in June. She will keep that appointment for now but will make sure to let us know if she has anymore problems. New script sent to the pharmacy for the correct dosing of potassium.

## 2023-08-07 ENCOUNTER — Encounter: Payer: Self-pay | Admitting: Neurology

## 2023-08-07 ENCOUNTER — Telehealth: Payer: Self-pay

## 2023-08-07 NOTE — Telephone Encounter (Signed)
 Call to Tammy, she states that [facility] ( [facility]) is requesting that 33 supply and quantity be edited. Advised I don't feel comfortable with that since there is an approval and she agrees. She advises that she is agreement and will find out what so going on so patient can get infused. She will call me back

## 2023-08-07 NOTE — Telephone Encounter (Signed)
 See mychart from 08/07/23

## 2023-08-29 ENCOUNTER — Encounter: Payer: Self-pay | Admitting: Neurology

## 2023-08-29 ENCOUNTER — Other Ambulatory Visit: Payer: Self-pay

## 2023-08-29 DIAGNOSIS — G35 Multiple sclerosis: Secondary | ICD-10-CM

## 2023-08-29 MED ORDER — METHYLPHENIDATE HCL 20 MG PO TABS: 20.0000 mg | ORAL_TABLET | Freq: Every day | ORAL | 0 refills | Status: DC

## 2023-08-29 NOTE — Telephone Encounter (Signed)
 Requested Prescriptions   Pending Prescriptions Disp Refills   methylphenidate  (RITALIN ) 20 MG tablet 30 tablet 0    Sig: Take 1 tablet (20 mg total) by mouth daily.   Last seen 01/29/23, next appt 10/30/23 and last note stated: Great to see you today. 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 Continue other medications.     Dispenses   Dispensed Days Supply Quantity Provider Pharmacy  methylphenidate  20 mg tablet 07/26/2023 30 30 tablet Wess Hammed, NP [facility] [phone] [place]  methylphenidate  20 mg tablet 06/12/2023 30 30 tablet Wess Hammed, NP [facility] [phone] [place]  methylphenidate  20 mg tablet 05/10/2023 30 30 tablet Dohmeier, Raoul Byes, MD [facility] [phone] [place]...  methylphenidate  20 mg tablet 03/19/2023 30 30 tablet Wess Hammed, NP [facility] [phone] [place]...  methylphenidate  20 mg tablet 02/13/2023 30 30 tablet Penumalli, Vikram R, MD [facility] [phone] [place]...  methylphenidate  20 mg tablet 01/15/2023 30 30 tablet Wess Hammed, NP [facility] [phone] [place]...  methylphenidate  20 mg tablet 12/13/2022 30 30 tablet Wess Hammed, NP [facility] [phone] [place]...  methylphenidate  20 mg tablet 11/13/2022 30 30 tablet Wess Hammed, NP [facility] [phone] [place]  methylphenidate  20 mg tablet 10/12/2022 30 30 tablet Wess Hammed, NP [facility] [phone] [place]...  methylphenidate  20 mg tablet 09/11/2022 30 30 tablet Sater, Sherida Dimmer, MD [facility] [phone] [place]..Aaron Aas

## 2023-09-04 ENCOUNTER — Other Ambulatory Visit: Payer: Self-pay | Admitting: Neurology

## 2023-09-04 ENCOUNTER — Encounter: Payer: Self-pay | Admitting: Neurology

## 2023-09-04 MED ORDER — PRAMIPEXOLE DIHYDROCHLORIDE 0.25 MG PO TABS: 0.2500 mg | ORAL_TABLET | Freq: Every day | ORAL | 0 refills | Status: DC

## 2023-09-25 ENCOUNTER — Other Ambulatory Visit: Payer: Self-pay | Admitting: Cardiology

## 2023-10-05 ENCOUNTER — Encounter: Payer: Self-pay | Admitting: Neurology

## 2023-10-05 DIAGNOSIS — G35 Multiple sclerosis: Secondary | ICD-10-CM

## 2023-10-05 MED ORDER — PRAMIPEXOLE DIHYDROCHLORIDE 0.25 MG PO TABS: 0.2500 mg | ORAL_TABLET | Freq: Every day | ORAL | 5 refills | Status: DC

## 2023-10-05 MED ORDER — GABAPENTIN 100 MG PO CAPS: 100.0000 mg | ORAL_CAPSULE | Freq: Every evening | ORAL | 2 refills | Status: DC | PRN

## 2023-10-05 MED ORDER — METHYLPHENIDATE HCL 20 MG PO TABS
20.0000 mg | ORAL_TABLET | Freq: Every day | ORAL | 0 refills | Status: DC
Start: 2023-10-05 — End: 2023-11-05

## 2023-10-30 ENCOUNTER — Ambulatory Visit: Admitting: Diagnostic Neuroimaging

## 2023-10-30 ENCOUNTER — Telehealth: Payer: Self-pay | Admitting: Diagnostic Neuroimaging

## 2023-10-30 MED ORDER — GABAPENTIN 100 MG PO CAPS: 100.0000 mg | ORAL_CAPSULE | Freq: Every evening | ORAL | 3 refills | Status: DC | PRN

## 2023-10-30 MED ORDER — PRAMIPEXOLE DIHYDROCHLORIDE 0.25 MG PO TABS: 0.2500 mg | ORAL_TABLET | Freq: Every day | ORAL | 3 refills | Status: AC

## 2023-10-30 NOTE — Progress Notes (Signed)
 HPI: FU CAD, hypertension, prolonged QT and mitral valve prolapse. Holter monitor in September of 2012 showed sinus rhythm with rare PVC. Patient had an abdominal CT for abdominal pain in December 2015. 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 Nuclear study January 2016 showed an ejection fraction of 56%, probable mild breast attenuation with possible mild mid anteroseptal ischemia. Cardiac catheterization February 2016 showed 80% LAD and 60-70% RCA. Ejection fraction 55-65%. The patient had PCI of the LAD and RCA. Echocardiogram September 2020 showed normal LV function, grade 1 diastolic dysfunction.  Venous Dopplers September 2020 showed no DVT.  Chest CT February 2023 showed aortic atherosclerosis, coronary artery disease and emphysema.  Lower extremity venous Dopplers November 2024 showed no DVT.  Since she was last seen, patient denies dyspnea, chest pain, palpitations or syncope.  Current Outpatient Medications  Medication Sig Dispense Refill   albuterol  (VENTOLIN  HFA) 108 (90 Base) MCG/ACT inhaler Inhale 2 puffs by mouth into the lungs every 6 (six) hours as needed for wheezing or shortness of breath. 18 g 0   amLODipine  (NORVASC ) 5 MG tablet Take 1 tablet (5 mg total) by mouth daily. 90 tablet 2   amoxicillin (AMOXIL) 500 MG capsule Take 2,000 mg by mouth once.     aspirin  81 MG chewable tablet Chew 81 mg by mouth daily.     atorvastatin  (LIPITOR) 80 MG tablet TAKE ONE TABLET BY MOUTH ONE TIME DAILY 90 tablet 3   Azelastine HCl 137 MCG/SPRAY SOLN Place 2 sprays into both nostrils daily as needed (Congestion).     Cholecalciferol (VITAMIN D -3) 125 MCG (5000 UT) TABS Take 5,000 Units by mouth daily.     ciprofloxacin  (CIPRO ) 500 MG tablet Take 500 mg by mouth as needed (Bladder infections).     diphenhydrAMINE (BENADRYL) 50 MG capsule Take 1 capsule by mouth as needed for itching (for side effect for Ocrevus ).     DULoxetine  (CYMBALTA )  60 MG capsule Take 1 capsule by mouth daily.     esomeprazole  (NEXIUM ) 40 MG capsule Take 1 capsule (40 mg total) by mouth daily at 12 noon. (Patient taking differently: Take 40 mg by mouth daily.) 90 capsule 3   ezetimibe  (ZETIA ) 10 MG tablet Take 1 tablet (10 mg total) by mouth daily. 90 tablet 2   ferrous gluconate  (FERGON) 324 MG tablet Take 324 mg by mouth daily with breakfast.     furosemide  (LASIX ) 20 MG tablet Take 2 tablets (40 mg total) by mouth daily. 180 tablet 2   gabapentin  (NEURONTIN ) 100 MG capsule Take 1 capsule (100 mg total) by mouth at bedtime as needed (Nerve pain). 90 capsule 3   linaclotide  (LINZESS ) 145 MCG CAPS capsule Take 145 mcg by mouth daily before breakfast.     losartan  (COZAAR ) 100 MG tablet Take 1 tablet (100 mg total) by mouth daily. 90 tablet 2   methocarbamol  (ROBAXIN ) 500 MG tablet Take 1 tablet (500 mg total) by mouth every 6 (six) hours as needed for muscle spasms. 60 tablet 1   methylphenidate  (RITALIN ) 20 MG tablet Take 1 tablet (20 mg total) by mouth daily. 30 tablet 0   metoprolol  succinate (TOPROL -XL) 50 MG 24 hr tablet Take 1 tablet (50 mg total) by mouth daily. Take with or immediately following a meal. 90 tablet 3   mirabegron  ER (MYRBETRIQ ) 50 MG TB24 tablet Take 50 mg by mouth every morning.     ocrelizumab  600 mg in sodium chloride  0.9 %  500 mL Inject 600 mg into the vein every 6 (six) months.     Oxycodone  HCl 10 MG TABS Take 0.5 tablets (5 mg total) by mouth every 4 (four) hours as needed. 40 tablet 0   potassium chloride  SA (KLOR-CON  M20) 20 MEQ tablet Take 2 tablets (40 mEq total) by mouth daily. 180 tablet 3   pramipexole  (MIRAPEX ) 0.25 MG tablet Take 1 tablet (0.25 mg total) by mouth at bedtime. 90 tablet 3   traMADol (ULTRAM) 50 MG tablet Take 100 mg by mouth every 6 (six) hours as needed for moderate pain (pain score 4-6).     No current facility-administered medications for this visit.     Past Medical History:  Diagnosis Date    Anxiety    Arthritis    CAD (coronary artery disease)    a. 06/2014 abnl MV;  b. 06/2014 Cath: LM nl, LAD 31m (2.75x28 Promus DES), LCX nl, RCA 60-58m (FFR = 0.80 -> 3.0x24 Promus DES), EF 55-65%.   Carpal tunnel syndrome    Cervical dysplasia    Chronic fatigue    COPD (chronic obstructive pulmonary disease) (HCC)    Depression    Family history of adverse reaction to anesthesia    son - n/v   Fibromyalgia    GERD (gastroesophageal reflux disease)    Hypertension    Mitral valve prolapse    Multiple sclerosis (HCC)    Pneumonia    Pre-diabetes    Prolonged QT interval    RLS (restless legs syndrome) 04/28/2015    Past Surgical History:  Procedure Laterality Date   ABDOMINAL HYSTERECTOMY     BICEPS TENDON REPAIR     bladder resuspension     CARPAL TUNNEL RELEASE     CERVICAL SPINE SURGERY  10-16-2009   CORONARY ANGIOPLASTY WITH STENT PLACEMENT     DILATION AND CURETTAGE OF UTERUS     FOOT SURGERY Bilateral    For bunions   HAMMER TOE SURGERY Left    LEFT HEART CATHETERIZATION WITH CORONARY ANGIOGRAM N/A 06/29/2014   Procedure: LEFT HEART CATHETERIZATION WITH CORONARY ANGIOGRAM;  Surgeon: Ozell JONETTA Fell, MD;  Location: [facility] CATH LAB;  Service: Cardiovascular;  Laterality: N/A;   ORIF TOE FRACTURE Right    Prolong QTC     Heart   rectovaginal fistula repair     ROTATOR CUFF REPAIR Left     Social History   Socioeconomic History   Marital status: Divorced    Spouse name: Not on file   Number of children: 2   Years of education: 13   Highest education level: Not on file  Occupational History   Occupation: Disabled    Comment: Disability  Tobacco Use   Smoking status: Every Day    Current packs/day: 1.00    Average packs/day: 1 pack/day for 35.0 years (35.0 ttl pk-yrs)    Types: Cigarettes   Smokeless tobacco: Never  Vaping Use   Vaping status: Never Used  Substance and Sexual Activity   Alcohol use: No    Alcohol/week: 0.0 standard drinks of alcohol   Drug  use: No   Sexual activity: Not on file  Other Topics Concern   Not on file  Social History Narrative   Patient lives with son   Patient does not drink caffeine.   Patient is right handed.   Social Drivers of Health   Financial Resource Strain: Unknown (04/26/2020)   Received from [facility] visits prior to 07/08/2022.  Overall Financial Resource Strain (CARDIA)    Difficulty of Paying Living Expenses: Patient refused  Food Insecurity: Low Risk  (07/13/2023)   [facility] from [facility]   Hunger Vital Sign    Within the past 12 months, you worried that your food would run out before you got money to buy more: Never true    Within the past 12 months, the food you bought just didn't last and you didn't have money to get more. : Never true  Transportation Needs: No Transportation Needs (07/13/2023)   Received from [facility]    In the past 12 months, has lack of reliable transportation kept you from medical appointments, meetings, work or from getting things needed for daily living? : No  Physical Activity: Unknown (04/26/2020)   Received from [facility] visits prior to 07/08/2022.   Exercise Vital Sign    Days of Exercise per Week: 0 days    Minutes of Exercise per Session: Not on file  Stress: Stress Concern Present (04/26/2020)   Received from [facility] visits prior to 07/08/2022.   [employer] of Occupational Health - Occupational Stress Questionnaire    Feeling of Stress : Rather much  Social Connections: Socially Isolated (04/26/2020)   Received from [facility] visits prior to 07/08/2022.   Social Advertising account executive    Frequency of Communication with Friends and Family: Never    Frequency of Social Gatherings with Friends and Family: Never    Attends Religious Services: More than 4 times per year    Active Member of [employer] or Organizations: No    Attends  Banker Meetings: Patient refused    Marital Status: Divorced  Catering manager Violence: Not At Risk (04/04/2023)   Humiliation, Afraid, Rape, and Kick questionnaire    Fear of Current or Ex-Partner: No    Emotionally Abused: No    Physically Abused: No    Sexually Abused: No    Family History  Problem Relation Age of Onset   Melanoma Mother    Colon polyps Mother    Diabetes Mother    Breast cancer Maternal Grandmother    Colon cancer Maternal Grandmother    Brain cancer Maternal Grandfather    Lung cancer Maternal Grandfather    Diabetes Maternal Grandfather    Kidney disease Neg Hx    Esophageal cancer Neg Hx    Gallbladder disease Neg Hx     ROS: Back pain but no fevers or chills, productive cough, hemoptysis, dysphasia, odynophagia, melena, hematochezia, dysuria, hematuria, rash, seizure activity, orthopnea, PND, pedal edema, claudication. Remaining systems are negative.  Physical Exam: Well-developed well-nourished in no acute distress.  Skin is warm and dry.  HEENT is normal.  Neck is supple.  Chest is clear to auscultation with normal expansion.  Cardiovascular exam is regular rate and rhythm.  Abdominal exam nontender or distended. No masses palpated. Extremities show no edema. neuro grossly intact  EKG Interpretation Date/Time:  Monday November 05 2023 15:12:22 EDT Ventricular Rate:  80 PR Interval:  164 QRS Duration:  62 QT Interval:  378 QTC Calculation: 435 R Axis:   69  Text Interpretation: Normal sinus rhythm Low voltage QRS Confirmed by Pietro Rogue ([id number]) on 11/05/2023 3:34:14 PM    A/P  1 coronary artery disease-patient is not having chest pain.  Continue medical therapy with aspirin  and statin.  2 hypertension-blood pressure controlled.  Continue present medical regimen.  3 hyperlipidemia-continue Lipitor and Zetia .  4 tobacco abuse-patient counseled on discontinuing.  5 chronic diastolic congestive heart failure-patient  remains euvolemic on examination.  Will continue diuretic at present dose.  6 history of prolonged QT interval-continue to avoid QT prolonging medications.  No history of syncope.  Redell Shallow, MD

## 2023-10-30 NOTE — Telephone Encounter (Signed)
 Pt request refills of gabapentin  (NEURONTIN ) 100 MG capsule and  pramipexole  (MIRAPEX ) 0.25 MG tablet  requesting a 90 day refill instead of 30 day for this medication.  Pt had appt with Dr. Margaret 6/24 and was unable to wait when Dr. Was behind because of another appointment she had. Pt request her reschedule appt be with Lauraine Born NP.

## 2023-10-30 NOTE — Telephone Encounter (Signed)
 refilled

## 2023-11-05 ENCOUNTER — Other Ambulatory Visit: Payer: Self-pay | Admitting: Cardiology

## 2023-11-05 ENCOUNTER — Ambulatory Visit (INDEPENDENT_AMBULATORY_CARE_PROVIDER_SITE_OTHER): Admitting: Cardiology

## 2023-11-05 ENCOUNTER — Encounter: Payer: Self-pay | Admitting: Cardiology

## 2023-11-05 ENCOUNTER — Encounter: Payer: Self-pay | Admitting: Neurology

## 2023-11-05 VITALS — BP 110/73 | HR 80 | Ht 62.0 in | Wt 156.0 lb

## 2023-11-05 DIAGNOSIS — E785 Hyperlipidemia, unspecified: Secondary | ICD-10-CM | POA: Diagnosis not present

## 2023-11-05 DIAGNOSIS — I10 Essential (primary) hypertension: Secondary | ICD-10-CM

## 2023-11-05 DIAGNOSIS — I2510 Atherosclerotic heart disease of native coronary artery without angina pectoris: Secondary | ICD-10-CM | POA: Diagnosis not present

## 2023-11-05 DIAGNOSIS — E876 Hypokalemia: Secondary | ICD-10-CM | POA: Diagnosis not present

## 2023-11-05 DIAGNOSIS — G35 Multiple sclerosis: Secondary | ICD-10-CM

## 2023-11-05 DIAGNOSIS — Z720 Tobacco use: Secondary | ICD-10-CM

## 2023-11-05 MED ORDER — METHYLPHENIDATE HCL 20 MG PO TABS: 20.0000 mg | ORAL_TABLET | Freq: Every day | ORAL | 0 refills | Status: DC

## 2023-11-05 NOTE — Telephone Encounter (Signed)
 Requested Prescriptions   Pending Prescriptions Disp Refills   methylphenidate  (RITALIN ) 20 MG tablet 30 tablet 0    Sig: Take 1 tablet (20 mg total) by mouth daily.   Last seen 01/29/23, next appt tomorrow  Dispenses   Dispensed Days Supply Quantity Provider Pharmacy  methylphenidate  20 mg tablet 10/05/2023 30 30 tablet Dohmeier, Dedra, MD [facility] [phone] [place]...  METHYLPHENIDATE  20 MG TAB 09/02/2023  20 each  Unspecified  methylphenidate  20 mg tablet 08/29/2023 30 30 tablet Gay[place] Lauraine PARAS, NP [facility] [phone] [place]...  methylphenidate  20 mg tablet 07/26/2023 30 30 tablet Gayland Lauraine PARAS, NP [facility] [phone] [place]...  methylphenidate  20 mg tablet 06/12/2023 30 30 tablet Gayland Lauraine PARAS, NP [facility] [phone] [place]...  methylphenidate  20 mg tablet 05/10/2023 30 30 tablet Dohmeier, Dedra, MD [facility] [phone] [place]...  methylphenidate  20 mg tablet 03/19/2023 30 30 tablet Gayland Lauraine PARAS, NP [facility] [phone] [place]...  methylphenidate  20 mg tablet 02/13/2023 30 30 tablet Penumalli, Vikram R, MD [facility] [phone] [place]...  methylphenidate  20 mg tablet 01/15/2023 30 30 tablet Gayland Lauraine PARAS, NP [facility] [phone] [place]...  methylphenidate  20 mg tablet 12/13/2022 30 30 tablet Gay[place] Lauraine PARAS, NP [facility] [phone] [place]...  methylphenidate  20 mg tablet 11/13/2022 30 30 tablet Gay[place] Lauraine PARAS, NP [facility] [phone] [place]..SABRA

## 2023-11-05 NOTE — Patient Instructions (Signed)

## 2023-11-06 ENCOUNTER — Ambulatory Visit: Payer: Self-pay | Admitting: Cardiology

## 2023-11-06 ENCOUNTER — Ambulatory Visit (INDEPENDENT_AMBULATORY_CARE_PROVIDER_SITE_OTHER): Admitting: Neurology

## 2023-11-06 ENCOUNTER — Encounter: Payer: Self-pay | Admitting: Neurology

## 2023-11-06 VITALS — BP 97/63 | HR 87 | Resp 16 | Ht 62.0 in

## 2023-11-06 DIAGNOSIS — R269 Unspecified abnormalities of gait and mobility: Secondary | ICD-10-CM

## 2023-11-06 DIAGNOSIS — G2581 Restless legs syndrome: Secondary | ICD-10-CM

## 2023-11-06 DIAGNOSIS — R5382 Chronic fatigue, unspecified: Secondary | ICD-10-CM | POA: Diagnosis not present

## 2023-11-06 DIAGNOSIS — G35 Multiple sclerosis: Secondary | ICD-10-CM

## 2023-11-06 LAB — BASIC METABOLIC PANEL WITH GFR
BUN/Creatinine Ratio: 7 — ABNORMAL LOW (ref 12–28)
BUN: 6 mg/dL — ABNORMAL LOW (ref 8–27)
CO2: 23 mmol/L (ref 20–29)
Calcium: 9.8 mg/dL (ref 8.7–10.3)
Chloride: 100 mmol/L (ref 96–106)
Creatinine, Ser: 0.82 mg/dL (ref 0.57–1.00)
Glucose: 71 mg/dL (ref 70–99)
Potassium: 4.2 mmol/L (ref 3.5–5.2)
Sodium: 139 mmol/L (ref 134–144)
eGFR: 81 mL/min/{1.73_m2} (ref >59)

## 2023-11-06 NOTE — Progress Notes (Signed)
 Patient: Summer Henderson Date of Birth: March 05, 1961  Reason for Visit: Follow up History from: Patient Primary Neurologist: Summer Henderson/Summer Henderson   ASSESSMENT AND PLAN 63 y.o. year old female   1.  Multiple sclerosis, secondary progressive (on Copaxone-2005, Avonex , Tysabri , Ocrevus  since 2020) -Continue Ocrevus  via home infusion, infusion last week (Ocrevus  infused over 3 hours with 1 hour monitoring period after, Solu-Medrol  100 mg instead of 125 mg.  She does not want Pepcid) -Check labs today -Check MRI of the brain, MRI cervical spine due to recurrent falls, gait instability -MRI of the brain in April 2024 showed overall stable MS consistent white matter disease  2.  Fatigue -On vitamin D , check level -Has been off Provigil , will continue Ritalin  20 mg daily -I have referred her for sleep consult, she canceled the appointment twice, given snoring, reported apnea spells, chronic fatigue, daytime somnolence, ESS 11, does not wish to pursue, wouldn't want to wear CPAP  - Can continue gabapentin  100 mg daily, claims it keeps her awake  3.  Depression - Continue follow-up with psychiatry  4.  Restless leg syndrome -Continue Mirapex    5.  Low back pain, lumbar fusion Nov 2024 with Summer Henderson  Orders Placed This Encounter  Procedures   MR BRAIN W WO CONTRAST   MR CERVICAL SPINE W WO CONTRAST   CBC with Differential/Platelet   IgG, IgA, IgM   Vitamin D , 25-hydroxy   Hepatic function panel   Follow-up in 6 months with Summer Henderson to rotate every few visits with me.  HISTORY OF PRESENT ILLNESS: Today 11/06/23 Lumbar fusion in Nov 2025 with Summer Henderson, it went well. Labs Sept 2024 IGM 23. Last infusion was in April 2025, was a month late, due to insurance delay. With infusion, her balance gets better. Had to leave last week for her appointment with Summer Henderson due to another appointment. Needs refill of gabapentin , Mirapex , which I sent 10/30/23, helps with RLS. Takes gabapentin  100  mg makes her stay awake. Doesn't want to have sleep study. Hasn't had any Ritalin  since Sunday (I filled yesterday), laying on the exam table. Vision is fine, has noticed her left side if chronically weaker, less muscle tone to leg, bottom of her right foot burns with prolonged walker, her feet feel heavy at night. Urinary urgency, nothing they can do for that.  Has frequent imbalance spells, catches herself before she falls.   Labs in March 2023 normal IgG IgA IgM.  MRI of the brain April 2024 was overall unchanged with stable MS lesions. She got her Ocrevus  infusion last week. Last week before her Ocrevus  she fell at the dentist, her left ankle rolled. Her left leg is weaker at baseline. Skin to both legs are numb, feet are heavy to walk. Tendency to trip with left foot. For new weeks prior to Ocrevus , low back pain, numbness to left buttocks that resolved. Vision is fine, has urinary urgency/incontinence, sees urology regularly, has done Botox in the past that went to her bowels. Had cancelled her appointment for sleep consult with Summer Henderson x 2. On Vitamin D . Struggles with chronic fatigue.  Update 07/13/22 SS: Scheduled for Ocrevus  tomorrow via home health. They had COVID, sinus issues few weeks ago. Ocrevus  had to be delayed. Her legs feel heavy, lifting cement blocks.  Has not taken Provigil  in months, last refilled in August 2023. Takes Ritalin  20 mg in AM. Takes Mirapex , more for left sided crawling sensation, resolved with Mirapex . Vitamin D  level was 36,  on OTC supplement. TSH 1.25. A1C 6.1.  Overall issues chronic fatigue, mentions repeatedly, she limits her daily activities, hard to care for her handicapped son.  Fatigue is main issue.  I asked about sleep apnea, she does snore, has woke up gasping, as mentioned significant daytime fatigue, somnolence, ESS 11. This appointment was at 845 she had to get up at 4 AM to get herself ready.  Update 02/02/22 SS: Summer Henderson is here today for follow-up.   Remains on Ocrevus . Last infusion was in August.  Gets home infusions.  Continues with fatigue, depression, everyday is hard.  Saw PCP yesterday, Zoloft was increased.  Remains on Ritalin , also Provigil  to keep her awake.  She expressed frustration with her transition of care at our office, medication refilling.  His caregiver for her son, Summer Henderson. We had a long visit discussing her concerns about her transition of care. She was upset. She stopped smoking 4 months ago.   HISTORY UPDATE (08/01/21, VRP): Since last visit, here for transition of care. Dx'd with MS in 2005 (bladder sxs, fatigue). Then has had balance issue, blurred vision, confusion.    Since last visit, symptoms are stable. Tolerating ocrevus . Tolerating pramipexole  (for restless sensation in left arm / torso). Tolerating ritalin  for fatigue. Had been on provigil  in the past (last filled 2020).    PRIOR HPI (02/03/21, Summer Henderson): Summer Henderson is a 63 year old right-handed white female with a history of secondary progressive multiple sclerosis.  The patient is on Ocrevus  and tolerates the medication well.  She has a chronic gait disorder, she has not had any falls since last seen.  She reports some issues with restless leg syndrome but her Mirapex  is quite effective to treat this.  She began having blood in her urine this morning, she does have a history of frequent urinary tract infections.  She has no history of renal calculi.  She had a sinus infection in mid August and was treated with antibiotics for this.  She has had some decreased hearing in her right ear with fluid behind the ear and some worsening of her gait.  The patient normally uses a cane or a walker for ambulation.  She comes in today for further evaluation.   PRIOR HPI (07/08/20, Summer Henderson): Summer Henderson is a 63 year old right-handed white female with a history of multiple sclerosis.  The patient is on Ocrevus , she last got her dose in January 2022.  She tolerates the drug fairly well.  She has  not noted any definite new symptoms associated with the multiple sclerosis.  She does report some numbness in the hands, she has chronic weakness of the proximal muscles of the left leg.  She reports gait instability, she has not had any recent falls.  She has a neurogenic bladder that requires Botox injections, she has in and out catheterizations.  She is bothered significantly by a restless limb syndrome, her arms cause troubles at night, she has to move them vigorously to get rid of the sensation.  She is having trouble sleeping because of this.  She was recently taken off of Cymbalta  and converted to Zoloft, the restless symptoms began shortly thereafter.  The patient has had ongoing problems with depression, she is followed through psychiatry.  She returns to the office today for an evaluation.  REVIEW OF SYSTEMS: Out of a complete 14 system review of symptoms, the patient complains only of the following symptoms, and all other reviewed systems are negative.  See HPI  ALLERGIES: Allergies  Allergen Reactions   Demerol [Meperidine] Other (See Comments)    [derm] rash, swelling, itching Other reaction(s): Other (See Comments) [derm] rash, swelling, itching   Erythromycin Rash   Morphine And Codeine Itching, Swelling and Rash    Denied allergy to codeine 03/23/23   Sulfa Antibiotics Rash   Diflucan [Fluconazole] Other (See Comments)    States she has prolonged QT and cannot take this.   Other     Other reaction(s): Other (See Comments) Z-pak causes EKG changes, was told by cardiologist not to take this.   Erythromycin Base Rash    [Derm] rash   Fesoterodine Palpitations   Sulfacetamide Sodium Rash   Tape Other (See Comments)    Adhesive tape And Steri Strips; Local reaction-burns skin   Toviaz [Fesoterodine Fumarate Er] Palpitations    HOME MEDICATIONS: Outpatient Medications Prior to Visit  Medication Sig Dispense Refill   albuterol  (VENTOLIN  HFA) 108 (90 Base) MCG/ACT inhaler  Inhale 2 puffs by mouth into the lungs every 6 (six) hours as needed for wheezing or shortness of breath. 18 g 0   amLODipine  (NORVASC ) 5 MG tablet Take 1 tablet (5 mg total) by mouth daily. 90 tablet 2   amoxicillin (AMOXIL) 500 MG capsule Take 2,000 mg by mouth once.     aspirin  81 MG chewable tablet Chew 81 mg by mouth daily.     atorvastatin  (LIPITOR) 80 MG tablet TAKE ONE TABLET BY MOUTH ONE TIME DAILY 90 tablet 3   Azelastine HCl 137 MCG/SPRAY SOLN Place 2 sprays into both nostrils daily as needed (Congestion).     Cholecalciferol (VITAMIN D -3) 125 MCG (5000 UT) TABS Take 5,000 Units by mouth daily.     ciprofloxacin  (CIPRO ) 500 MG tablet Take 500 mg by mouth as needed (Bladder infections).     diphenhydrAMINE (BENADRYL) 50 MG capsule Take 1 capsule by mouth as needed for itching (for side effect for Ocrevus ).     DULoxetine  (CYMBALTA ) 60 MG capsule Take 1 capsule by mouth daily.     esomeprazole  (NEXIUM ) 40 MG capsule Take 1 capsule (40 mg total) by mouth daily at 12 noon. (Patient taking differently: Take 40 mg by mouth daily.) 90 capsule 3   ezetimibe  (ZETIA ) 10 MG tablet Take 1 tablet (10 mg total) by mouth daily. 90 tablet 2   ferrous gluconate  (FERGON) 324 MG tablet Take 324 mg by mouth daily with breakfast.     furosemide  (LASIX ) 20 MG tablet Take 2 tablets (40 mg total) by mouth daily. 180 tablet 2   gabapentin  (NEURONTIN ) 100 MG capsule Take 1 capsule (100 mg total) by mouth at bedtime as needed (Nerve pain). 90 capsule 3   linaclotide  (LINZESS ) 145 MCG CAPS capsule Take 145 mcg by mouth daily before breakfast.     losartan  (COZAAR ) 100 MG tablet Take 1 tablet (100 mg total) by mouth daily. 90 tablet 2   methocarbamol  (ROBAXIN ) 500 MG tablet Take 1 tablet (500 mg total) by mouth every 6 (six) hours as needed for muscle spasms. 60 tablet 1   methylphenidate  (RITALIN ) 20 MG tablet Take 1 tablet (20 mg total) by mouth daily. 30 tablet 0   metoprolol  succinate (TOPROL -XL) 50 MG 24 hr  tablet Take 1 tablet (50 mg total) by mouth daily. Take with or immediately following a meal. 90 tablet 3   mirabegron  ER (MYRBETRIQ ) 50 MG TB24 tablet Take 50 mg by mouth every morning.     ocrelizumab  600 mg in sodium chloride  0.9 % 500 mL  Inject 600 mg into the vein every 6 (six) months.     Oxycodone  HCl 10 MG TABS Take 0.5 tablets (5 mg total) by mouth every 4 (four) hours as needed. 40 tablet 0   potassium chloride  SA (KLOR-CON  M20) 20 MEQ tablet Take 2 tablets (40 mEq total) by mouth daily. 180 tablet 3   pramipexole  (MIRAPEX ) 0.25 MG tablet Take 1 tablet (0.25 mg total) by mouth at bedtime. 90 tablet 3   traMADol (ULTRAM) 50 MG tablet Take 100 mg by mouth every 6 (six) hours as needed for moderate pain (pain score 4-6).     No facility-administered medications prior to visit.    PAST MEDICAL HISTORY: Past Medical History:  Diagnosis Date   Anxiety    Arthritis    CAD (coronary artery disease)    a. 06/2014 abnl MV;  b. 06/2014 Cath: LM nl, LAD 45m (2.75x28 Promus DES), LCX nl, RCA 60-2m (FFR = 0.80 -> 3.0x24 Promus DES), EF 55-65%.   Carpal tunnel syndrome    Cervical dysplasia    Chronic fatigue    COPD (chronic obstructive pulmonary disease) (HCC)    Depression    Family history of adverse reaction to anesthesia    son - n/v   Fibromyalgia    GERD (gastroesophageal reflux disease)    Hypertension    Mitral valve prolapse    Multiple sclerosis (HCC)    Pneumonia    Pre-diabetes    Prolonged QT interval    RLS (restless legs syndrome) 04/28/2015    PAST SURGICAL HISTORY: Past Surgical History:  Procedure Laterality Date   ABDOMINAL HYSTERECTOMY     BICEPS TENDON REPAIR     bladder resuspension     CARPAL TUNNEL RELEASE     CERVICAL SPINE SURGERY  10-16-2009   CORONARY ANGIOPLASTY WITH STENT PLACEMENT     DILATION AND CURETTAGE OF UTERUS     FOOT SURGERY Bilateral    For bunions   HAMMER TOE SURGERY Left    LEFT HEART CATHETERIZATION WITH CORONARY ANGIOGRAM N/A  06/29/2014   Procedure: LEFT HEART CATHETERIZATION WITH CORONARY ANGIOGRAM;  Surgeon: Ozell JONETTA Fell, MD;  [facility] C[facility] LAB;  Service: Cardiovascular;  Laterality: N/A;   ORIF TOE FRACTURE Right    Prolong QTC     Heart   rectovaginal fistula repair     ROTATOR CUFF REPAIR Left     FAMILY HISTORY: Family History  Problem Relation Age of Onset   Melanoma Mother    Colon polyps Mother    Diabetes Mother    Breast cancer Maternal Grandmother    Colon cancer Maternal Grandmother    Brain cancer Maternal Grandfather    Lung cancer Maternal Grandfather    Diabetes Maternal Grandfather    Kidney disease Neg Hx    Esophageal cancer Neg Hx    Gallbladder disease Neg Hx     SOCIAL HISTORY: Social History   Socioeconomic History   Marital status: Divorced    Spouse name: Not on file   Number of children: 2   Years of education: 13   Highest education level: Not on file  Occupational History   Occupation: Disabled    Comment: Disability  Tobacco Use   Smoking status: Every Day    Current packs/day: 1.00    Average packs/day: 1 pack/day for 35.0 years (35.0 ttl pk-yrs)    Types: Cigarettes   Smokeless tobacco: Never  Vaping Use   Vaping status: Never Used  Substance and Sexual  Activity   Alcohol use: No    Alcohol/week: 0.0 standard drinks of alcohol   Drug use: No   Sexual activity: Not on file  Other Topics Concern   Not on file  Social History Narrative   Patient lives with son   Patient does not drink caffeine.   Patient is right handed.   Social Drivers of Health   Financial Resource Strain: Unknown (04/26/2020)   Received from [facility] visits prior to 07/08/2022.   Overall Financial Resource Strain (CARDIA)    Difficulty of Paying Living Expenses: Patient refused  Food Insecurity: Low Risk  (07/13/2023)   Received from [facility]   Hunger Vital Sign    Within the past 12 months, you worried that your food would run out before  you got money to buy more: Never true    Within the past 12 months, the food you bought just didn't last and you didn't have money to get more. : Never true  Transportation Needs: No Transportation Needs (07/13/2023)   Received from [facility]    In the past 12 months, has lack of reliable transportation kept you from medical appointments, meetings, work or from getting things needed for daily living? : No  Physical Activity: Unknown (04/26/2020)   Received from [facility] visits prior to 07/08/2022.   Exercise Vital Sign    Days of Exercise per Week: 0 days    Minutes of Exercise per Session: Not on file  Stress: Stress Concern Present (04/26/2020)   Received from [facility] visits prior to 07/08/2022.   [employer] of Occupational Health - Occupational Stress Questionnaire    Feeling of Stress : Rather much  Social Connections: Socially Isolated (04/26/2020)   Received from [facility] visits prior to 07/08/2022.   Social Advertising account executive    Frequency of Communication with Friends and Family: Never    Frequency of Social Gatherings with Friends and Family: Never    Attends Religious Services: More than 4 times per year    Active Member of [employer] or Organizations: No    Attends Banker Meetings: Patient refused    Marital Status: Divorced  Catering manager Violence: Not At Risk (04/04/2023)   Humiliation, Afraid, Rape, and Kick questionnaire    Fear of Current or Ex-Partner: No    Emotionally Abused: No    Physically Abused: No    Sexually Abused: No   PHYSICAL EXAM  Vitals:   11/06/23 1302  BP: 97/63  Pulse: 87  Resp: 16  Height: 5' 2 (1.575 m)   Body mass index is 28.53 kg/m.  Generalized: Well developed, in no acute distress  Neurological examination  Mentation: Alert oriented to time, place, history taking. Follows all commands speech and language  fluent Cranial nerve II-XII: Pupils were equal round reactive to light. Extraocular movements were full, visual field were full on confrontational test. Facial sensation and strength were normal. Head turning and shoulder shrug  were normal and symmetric. Motor: 4/5 left lower extremity, mild increased tone Sensory: Decreased soft touch to left leg Coordination: Cerebellar testing reveals good finger-nose-finger and heel-to-shin bilaterally, but hard  to lift the left  Gait and station: Gait is is cautious, independent, With walking, every few steps her left foot crosses over the right Reflexes: Deep tendon reflexes are symmetric but increased to the left  DIAGNOSTIC DATA (LABS, IMAGING, TESTING) - I  reviewed patient records, labs, notes, testing and imaging myself where available.  Lab Results  Component Value Date   WBC 11.4 (H) 04/13/2023   HGB 13.4 04/13/2023   HCT 39.9 04/13/2023   MCV 92.8 04/13/2023   PLT 608 (H) 04/13/2023      Component Value Date/Time   NA 139 11/05/2023 1544   K 4.2 11/05/2023 1544   CL 100 11/05/2023 1544   CO2 23 11/05/2023 1544   GLUCOSE 71 11/05/2023 1544   GLUCOSE 91 04/13/2023 1345   BUN 6 (L) 11/05/2023 1544   CREATININE 0.82 11/05/2023 1544   CREATININE 0.71 01/11/2016 1502   CALCIUM  9.8 11/05/2023 1544   PROT 6.5 01/29/2023 1357   ALBUMIN 4.2 01/29/2023 1357   AST 25 01/29/2023 1357   ALT 27 01/29/2023 1357   ALKPHOS 179 (H) 01/29/2023 1357   BILITOT 0.4 01/29/2023 1357   GFRNONAA 49 (L) 04/13/2023 1345   GFRNONAA >89 06/17/2014 1111   GFRAA 98 03/24/2020 1526   GFRAA >89 06/17/2014 1111   Lab Results  Component Value Date   CHOL 111 10/19/2022   HDL 38 (L) 10/19/2022   LDLCALC 48 10/19/2022   TRIG 148 10/19/2022   CHOLHDL 2.9 10/19/2022   No results found for: HGBA1C No results found for: VITAMINB12 No results found for: TSH  Lauraine Born, AGNP-C, DNP 11/06/2023, 2:05 PM [facility] [address],  [address] [place], [place] [address] [phone]

## 2023-11-06 NOTE — Patient Instructions (Addendum)
 Check MRI brain  Check labs today  Continue Ocrevus  every 6 months  Follow up in 6 months

## 2023-11-07 ENCOUNTER — Ambulatory Visit: Payer: Self-pay | Admitting: Neurology

## 2023-11-07 LAB — CBC WITH DIFFERENTIAL/PLATELET
Basophils Absolute: 0.1 10*3/uL (ref 0.0–0.2)
Basos: 1 %
EOS (ABSOLUTE): 0.1 10*3/uL (ref 0.0–0.4)
Eos: 1 %
Hematocrit: 46.1 % (ref 34.0–46.6)
Hemoglobin: 15.4 g/dL (ref 11.1–15.9)
Immature Grans (Abs): 0 10*3/uL (ref 0.0–0.1)
Immature Granulocytes: 0 %
Lymphocytes Absolute: 2.9 10*3/uL (ref 0.7–3.1)
Lymphs: 29 %
MCH: 31.6 pg (ref 26.6–33.0)
MCHC: 33.4 g/dL (ref 31.5–35.7)
MCV: 95 fL (ref 79–97)
Monocytes Absolute: 0.7 10*3/uL (ref 0.1–0.9)
Monocytes: 7 %
Neutrophils Absolute: 6.2 10*3/uL (ref 1.4–7.0)
Neutrophils: 62 %
Platelets: 424 10*3/uL (ref 150–450)
RBC: 4.88 x10E6/uL (ref 3.77–5.28)
RDW: 13.1 % (ref 11.7–15.4)
WBC: 10 10*3/uL (ref 3.4–10.8)

## 2023-11-07 LAB — HEPATIC FUNCTION PANEL
ALT: 16 IU/L (ref 0–32)
AST: 16 IU/L (ref 0–40)
Albumin: 4.3 g/dL (ref 3.9–4.9)
Alkaline Phosphatase: 190 IU/L — ABNORMAL HIGH (ref 44–121)
Bilirubin Total: 0.5 mg/dL (ref 0.0–1.2)
Bilirubin, Direct: 0.16 mg/dL (ref 0.00–0.40)
Total Protein: 6.4 g/dL (ref 6.0–8.5)

## 2023-11-07 LAB — IGG, IGA, IGM
IgA/Immunoglobulin A, Serum: 83 mg/dL — ABNORMAL LOW (ref 87–352)
IgG (Immunoglobin G), Serum: 914 mg/dL (ref 586–1602)
IgM (Immunoglobulin M), Srm: 26 mg/dL (ref 26–217)

## 2023-11-07 LAB — VITAMIN D 25 HYDROXY (VIT D DEFICIENCY, FRACTURES): Vit D, 25-Hydroxy: 49.7 ng/mL (ref 30.0–100.0)

## 2023-11-08 ENCOUNTER — Telehealth: Payer: Self-pay | Admitting: Neurology

## 2023-11-08 NOTE — Telephone Encounter (Signed)
 Summer Henderson: [id number] exp. 11/08/23-01/07/24 sent to GI [phone]

## 2023-11-12 NOTE — Telephone Encounter (Signed)
 She told GI she wants to go to [facility]. I changed the location on the PA and sent the [facility] to schedule. [phone]

## 2023-11-12 NOTE — Telephone Encounter (Signed)
 Pt called, message from MRI coordinator was relayed, # to [facility] location was provided

## 2023-11-18 ENCOUNTER — Ambulatory Visit (HOSPITAL_BASED_OUTPATIENT_CLINIC_OR_DEPARTMENT_OTHER)
Admission: RE | Admit: 2023-11-18 | Discharge: 2023-11-18 | Disposition: A | Discharge status: Home / Self Care | Source: Ambulatory Visit | Attending: Neurology | Admitting: Neurology

## 2023-11-18 DIAGNOSIS — G35 Multiple sclerosis: Secondary | ICD-10-CM

## 2023-11-18 MED ORDER — GADOBUTROL 1 MMOL/ML IV SOLN
7.0000 mL | Freq: Once | INTRAVENOUS | Status: AC | PRN
  Administered 2023-11-18: 7 mL via INTRAVENOUS

## 2023-11-23 MED ORDER — MODAFINIL 200 MG PO TABS: 200.0000 mg | ORAL_TABLET | Freq: Every day | ORAL | 5 refills | Status: AC

## 2023-11-23 NOTE — Telephone Encounter (Signed)
 I called the patient. Having significant fatigue.  Wants to go back on Provigil  200 mg daily for fatigue. In the past was treated with Ritalin  + Provigil . Will send it in. She would also like MRI cervical spine faxed to Dr. Joshua at [facility] who did her previous ACDF.  Meds ordered this encounter  Medications   modafinil  (PROVIGIL ) 200 MG tablet    Sig: Take 1 tablet (200 mg total) by mouth daily.    Dispense:  30 tablet    Refill:  5   IMPRESSION: 1. There are demyelinating lesions in the posterior central aspect of the cord at the C2 level and in the central cord at the T1 level without enhancement. No prior studies for comparison.   2. 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 There is no significant spinal stenosis.

## 2023-11-26 ENCOUNTER — Telehealth: Payer: Self-pay | Admitting: Pharmacist

## 2023-11-26 NOTE — Telephone Encounter (Signed)
 Pharmacy Patient Advocate Encounter   Received notification from Patient Pharmacy that prior authorization for Modafinil  200MG  tablets is required/requested.   Insurance verification completed.   The patient is insured through [place] .   Per test claim: PA required; PA submitted to above mentioned insurance via CoverMyMeds Key/confirmation #/EOC [id number] Status is pending

## 2023-11-26 NOTE — Telephone Encounter (Signed)
 Pharmacy Patient Advocate Encounter  Received notification from [employer] that Prior Authorization for Modafinil  200MG  tablets has been APPROVED from 05/09/2023 to 05/07/2024   PA #/Case ID/Reference #: [id number]

## 2023-12-17 ENCOUNTER — Encounter: Payer: Self-pay | Admitting: Neurology

## 2023-12-17 DIAGNOSIS — G35 Multiple sclerosis: Secondary | ICD-10-CM

## 2023-12-17 MED ORDER — METHYLPHENIDATE HCL 20 MG PO TABS: 20.0000 mg | ORAL_TABLET | Freq: Every day | ORAL | 0 refills | Status: DC

## 2023-12-17 NOTE — Telephone Encounter (Signed)
 Requested Prescriptions   Pending Prescriptions Disp Refills   methylphenidate  (RITALIN ) 20 MG tablet 30 tablet 0    Sig: Take 1 tablet (20 mg total) by mouth daily.   Last seen 11/06/23 Next appt 05/20/24 Dispenses   Dispensed Days Supply Quantity Provider Pharmacy  methylphenidate  20 mg tablet 11/05/2023 30 30 tablet Gayland Lauraine PARAS, NP [facility] [phone] [place]...  methylphenidate  20 mg tablet 10/05/2023 30 30 tablet Dohmeier, Dedra, MD [facility] [phone] [place]...  METHYLPHENIDATE  20 MG TAB 09/02/2023  20 each  Unspecified  methylphenidate  20 mg tablet 08/29/2023 30 30 tablet Gayland Lauraine PARAS, NP [facility] [phone] [place]...  methylphenidate  20 mg tablet 07/26/2023 30 30 tablet Gayland Lauraine PARAS, NP [facility] [phone] [place]...  methylphenidate  20 mg tablet 06/12/2023 30 30 tablet Gayland Lauraine PARAS, NP [facility] [phone] [place]...  methylphenidate  20 mg tablet 05/10/2023 30 30 tablet Dohmeier, Dedra, MD [facility] [phone] [place]...  methylphenidate  20 mg tablet 03/19/2023 30 30 tablet Gayland Lauraine PARAS, NP [facility] [phone] [place]...  methylphenidate  20 mg tablet 02/13/2023 30 30 tablet Penumalli, Vikram R, MD [facility] [phone] [place]...  methylphenidate  20 mg tablet 01/15/2023 30 30 tablet Gayland Lauraine PARAS, NP [facility] [phone] [place]..SABRA

## 2024-01-22 ENCOUNTER — Encounter: Payer: Self-pay | Admitting: Cardiology

## 2024-01-22 ENCOUNTER — Encounter: Payer: Self-pay | Admitting: Neurology

## 2024-01-22 DIAGNOSIS — G35 Multiple sclerosis: Secondary | ICD-10-CM

## 2024-01-22 MED ORDER — METHYLPHENIDATE HCL 20 MG PO TABS
20.0000 mg | ORAL_TABLET | Freq: Every day | ORAL | 0 refills | Status: DC
Start: 2024-01-22 — End: 2024-02-21

## 2024-01-22 NOTE — Telephone Encounter (Signed)
 Requested Prescriptions   Pending Prescriptions Disp Refills   methylphenidate  (RITALIN ) 20 MG tablet 30 tablet 0    Sig: Take 1 tablet (20 mg total) by mouth daily.   Last seen 11/06/23 Next appt 05/20/24 Dispenses   Dispensed Days Supply Quantity Provider Pharmacy  methylphenidate  20 mg tablet 12/17/2023 30 30 tablet Penumalli, Vikram R, MD [facility] [phone] [place]...  methylphenidate  20 mg tablet 11/05/2023 30 30 tablet Gayland Lauraine PARAS, NP [facility] [phone] [place]...  methylphenidate  20 mg tablet 10/05/2023 30 30 tablet Dohmeier, Dedra, MD [facility] [phone] [place]...  METHYLPHENIDATE  20 MG TAB 09/02/2023  20 each  Unspecified  methylphenidate  20 mg tablet 08/29/2023 30 30 tablet Gayland Lauraine PARAS, NP [facility] [phone] [place]...  methylphenidate  20 mg tablet 07/26/2023 30 30 tablet Gayland Lauraine PARAS, NP [facility] [phone] [place]...  methylphenidate  20 mg tablet 06/12/2023 30 30 tablet Gayland Lauraine PARAS, NP [facility] [phone] [place]...  methylphenidate  20 mg tablet 05/10/2023 30 30 tablet Dohmeier, Dedra, MD [facility] [phone] [place]...  methylphenidate  20 mg tablet 03/19/2023 30 30 tablet Gayland Lauraine PARAS, NP [facility] [phone] [place]...  methylphenidate  20 mg tablet 02/13/2023 30 30 tablet Penumalli, Eduard SAUNDERS, MD [facility] [phone] [place]..SABRA

## 2024-01-23 ENCOUNTER — Encounter: Payer: Self-pay | Admitting: Cardiology

## 2024-01-23 MED ORDER — METOPROLOL SUCCINATE ER 50 MG PO TB24: 50.0000 mg | ORAL_TABLET | Freq: Every day | ORAL | 2 refills | Status: AC

## 2024-01-24 MED ORDER — ESOMEPRAZOLE MAGNESIUM 40 MG PO CPDR: 40.0000 mg | DELAYED_RELEASE_CAPSULE | Freq: Every day | ORAL | 3 refills | Status: AC

## 2024-02-21 ENCOUNTER — Encounter: Payer: Self-pay | Admitting: Neurology

## 2024-02-21 DIAGNOSIS — G35D Multiple sclerosis, unspecified: Secondary | ICD-10-CM

## 2024-02-21 MED ORDER — METHYLPHENIDATE HCL 20 MG PO TABS: 20.0000 mg | ORAL_TABLET | Freq: Every day | ORAL | 0 refills | Status: DC

## 2024-02-21 NOTE — Telephone Encounter (Signed)
 Last seen on 11/06/23 Follow up scheduled on 05/20/24   Dispensed Days Supply Quantity Provider Pharmacy  methylphenidate  20 mg tablet 01/22/2024 30 30 tablet Gayland Lauraine PARAS, NP [facility] [phone] [place]...   Rx pending to be signed

## 2024-03-25 ENCOUNTER — Encounter: Payer: Self-pay | Admitting: Neurology

## 2024-03-25 DIAGNOSIS — G35D Multiple sclerosis, unspecified: Secondary | ICD-10-CM

## 2024-03-25 MED ORDER — METHYLPHENIDATE HCL 20 MG PO TABS: 20.0000 mg | ORAL_TABLET | Freq: Every day | ORAL | 0 refills | Status: DC

## 2024-03-25 NOTE — Telephone Encounter (Signed)
 Last seen on 11/06/23 Follow up scheduled on 05/20/24   Dispensed Days Supply Quantity Provider Pharmacy  methylphenidate  20 mg tablet 02/21/2024 30 30 tablet Gayland Lauraine PARAS, NP [facility] [phone] [place]     Rx pending to be signed

## 2024-04-24 ENCOUNTER — Telehealth: Payer: Self-pay

## 2024-04-24 NOTE — Telephone Encounter (Signed)
Patient requested gabapentin refill.

## 2024-04-28 ENCOUNTER — Other Ambulatory Visit: Payer: Self-pay | Admitting: *Deleted

## 2024-04-28 ENCOUNTER — Encounter: Payer: Self-pay | Admitting: Neurology

## 2024-04-28 DIAGNOSIS — G35D Multiple sclerosis, unspecified: Secondary | ICD-10-CM

## 2024-04-28 MED ORDER — GABAPENTIN 100 MG PO CAPS: 100.0000 mg | ORAL_CAPSULE | Freq: Every evening | ORAL | 0 refills | Status: AC | PRN

## 2024-04-28 NOTE — Telephone Encounter (Signed)
 Last seen on 11/06/23 per note  Can continue gabapentin  100 mg daily, claims it keeps her awake  Follow up scheduled on 05/20/24

## 2024-04-28 NOTE — Telephone Encounter (Signed)
 Requested Prescriptions   Pending Prescriptions Disp Refills   methylphenidate  (RITALIN ) 20 MG tablet 30 tablet 0    Sig: Take 1 tablet (20 mg total) by mouth daily.   Last seen 11/06/23 Next appt 05/20/24  Dispenses   Dispensed Days Supply Quantity Provider Pharmacy  methylphenidate  20 mg tablet 03/25/2024 30 30 tablet Gayland Lauraine PARAS, NP [facility] [phone] [place]...  methylphenidate  20 mg tablet 02/21/2024 30 30 tablet Gayland Lauraine PARAS, NP [facility] [phone] [place]...  methylphenidate  20 mg tablet 01/22/2024 30 30 tablet Gayland Lauraine PARAS, NP [facility] [phone] [place]...  methylphenidate  20 mg tablet 12/17/2023 30 30 tablet Penumalli, Vikram R, MD [facility] [phone] [place]...  methylphenidate  20 mg tablet 11/05/2023 30 30 tablet Gayland Lauraine PARAS, NP [facility] [phone] [place]...  methylphenidate  20 mg tablet 10/05/2023 30 30 tablet Dohmeier, Dedra, MD [facility] [phone] [place]...  METHYLPHENIDATE  20 MG TAB 09/02/2023  20 each  Unspecified  methylphenidate  20 mg tablet 08/29/2023 30 30 tablet Gayland Lauraine PARAS, NP [facility] [phone] [place]...  methylphenidate  20 mg tablet 07/26/2023 30 30 tablet Gayland Lauraine PARAS, NP [facility] [phone] [place]...  methylphenidate  20 mg tablet 06/12/2023 30 30 tablet Gayland Lauraine PARAS, NP [facility] [phone] [place]...  methylphenidate  20 mg tablet 05/10/2023 30 30 tablet Dohmeier, Dedra, MD [facility] [phone] [place]..SABRA

## 2024-04-29 MED ORDER — METHYLPHENIDATE HCL 20 MG PO TABS: 20.0000 mg | ORAL_TABLET | Freq: Every day | ORAL | 0 refills | Status: DC

## 2024-05-12 ENCOUNTER — Emergency Department (HOSPITAL_BASED_OUTPATIENT_CLINIC_OR_DEPARTMENT_OTHER)
Admission: EM | Admit: 2024-05-12 | Discharge: 2024-05-12 | Disposition: A | Discharge status: Home / Self Care | Attending: Emergency Medicine | Admitting: Emergency Medicine

## 2024-05-12 ENCOUNTER — Other Ambulatory Visit: Payer: Self-pay

## 2024-05-12 ENCOUNTER — Emergency Department (HOSPITAL_BASED_OUTPATIENT_CLINIC_OR_DEPARTMENT_OTHER)

## 2024-05-12 ENCOUNTER — Encounter (HOSPITAL_BASED_OUTPATIENT_CLINIC_OR_DEPARTMENT_OTHER): Payer: Self-pay

## 2024-05-12 DIAGNOSIS — S6992XA Unspecified injury of left wrist, hand and finger(s), initial encounter: Secondary | ICD-10-CM | POA: Diagnosis present

## 2024-05-12 DIAGNOSIS — Z79899 Other long term (current) drug therapy: Secondary | ICD-10-CM | POA: Insufficient documentation

## 2024-05-12 DIAGNOSIS — Y92019 Unspecified place in single-family (private) house as the place of occurrence of the external cause: Secondary | ICD-10-CM | POA: Diagnosis not present

## 2024-05-12 DIAGNOSIS — W260XXA Contact with knife, initial encounter: Secondary | ICD-10-CM | POA: Diagnosis not present

## 2024-05-12 DIAGNOSIS — Z23 Encounter for immunization: Secondary | ICD-10-CM | POA: Insufficient documentation

## 2024-05-12 DIAGNOSIS — S61412A Laceration without foreign body of left hand, initial encounter: Secondary | ICD-10-CM | POA: Insufficient documentation

## 2024-05-12 DIAGNOSIS — Z7982 Long term (current) use of aspirin: Secondary | ICD-10-CM | POA: Diagnosis not present

## 2024-05-12 MED ORDER — TETANUS-DIPHTH-ACELL PERTUSSIS 5-2-15.5 LF-MCG/0.5 IM SUSP
0.5000 mL | Freq: Once | INTRAMUSCULAR | Status: AC
  Administered 2024-05-12: 0.5 mL via INTRAMUSCULAR
  Filled 2024-05-12: qty 0.5

## 2024-05-12 MED ORDER — LIDOCAINE HCL (PF) 1 % IJ SOLN
5.0000 mL | Freq: Once | INTRAMUSCULAR | Status: AC
  Administered 2024-05-12: 5 mL
  Filled 2024-05-12: qty 5

## 2024-05-12 NOTE — ED Notes (Signed)

## 2024-05-12 NOTE — ED Triage Notes (Signed)
 Patient here POV from Home.  Endorses puncture wound to Left Medial Hand that occurred this AM while picking ice. Tetanus updated in 2019. Deep puncture that patient states touched bone.  NAD noted during triage. A&Ox4. GCS 15. Ambulatory.

## 2024-05-12 NOTE — ED Provider Notes (Signed)
 " [place] [facility] Provider Note   CSN: [id number] Arrival date & time: 05/12/24  1945     Patient presents with: Hand Injury  HPI Summer Henderson is a 64 y.o. female presenting for left hand laceration.  States she was picking at some ice with a knife this morning when she accidentally punctured her hand just at the base of her left thumb.  She states she thought she might of touch bone.  She reports that her tetanus was last done 2019.  States there was some bleeding but controlled with direct pressure.  Denies any loss of range of motion or sensation.    Hand Injury      Prior to Admission medications  Medication Sig Start Date End Date Taking? Authorizing Provider  albuterol  (VENTOLIN  HFA) 108 (90 Base) MCG/ACT inhaler Inhale 2 puffs by mouth into the lungs every 6 (six) hours as needed for wheezing or shortness of breath. 08/09/21   Zackowski, Scott, MD  amLODipine  (NORVASC ) 5 MG tablet Take 1 tablet (5 mg total) by mouth daily. 07/23/23   Pietro Redell RAMAN, MD  amoxicillin (AMOXIL) 500 MG capsule Take 2,000 mg by mouth once. 03/14/23   [provider]  aspirin  81 MG chewable tablet Chew 81 mg by mouth daily.    [provider]  atorvastatin  (LIPITOR) 80 MG tablet TAKE ONE TABLET BY MOUTH ONE TIME DAILY 09/26/23   Pietro Redell RAMAN, MD  Azelastine HCl 137 MCG/SPRAY SOLN Place 2 sprays into both nostrils daily as needed (Congestion). 08/11/21   [provider]  Cholecalciferol (VITAMIN D -3) 125 MCG (5000 UT) TABS Take 5,000 Units by mouth daily.    [provider]  ciprofloxacin  (CIPRO ) 500 MG tablet Take 500 mg by mouth as needed (Bladder infections). 03/06/23   [provider]  diphenhydrAMINE (BENADRYL) 50 MG capsule Take 1 capsule by mouth as needed for itching (for side effect for Ocrevus ). 05/17/21   [provider]  esomeprazole  (NEXIUM ) 40 MG capsule Take 1 capsule (40 mg total) by mouth  daily at 12 noon. 01/24/24   Pietro Redell RAMAN, MD  ezetimibe  (ZETIA ) 10 MG tablet Take 1 tablet (10 mg total) by mouth daily. 07/23/23   Pietro Redell RAMAN, MD  ferrous gluconate  [facility]) 324 MG tablet Take 324 mg by mouth daily with breakfast. 07/25/21   [provider]  furosemide  (LASIX ) 20 MG tablet Take 2 tablets (40 mg total) by mouth daily. 07/23/23   Pietro Redell RAMAN, MD  gabapentin  (NEURONTIN ) 100 MG capsule Take 1 capsule (100 mg total) by mouth at bedtime as needed (Nerve pain). 04/28/24   Gayland Lauraine PARAS, NP  linaclotide  (LINZESS ) 145 MCG CAPS capsule Take 145 mcg by mouth daily before breakfast.    [provider]  losartan  (COZAAR ) 100 MG tablet Take 1 tablet (100 mg total) by mouth daily. 07/23/23   Pietro Redell RAMAN, MD  methocarbamol  (ROBAXIN ) 500 MG tablet Take 1 tablet (500 mg total) by mouth every 6 (six) hours as needed for muscle spasms. 04/05/23   Meyran, Suzen Lacks, NP  methylphenidate  (RITALIN ) 20 MG tablet Take 1 tablet (20 mg total) by mouth daily. 04/29/24   Gayland Lauraine PARAS, NP  metoprolol  succinate (TOPROL -XL) 50 MG 24 hr tablet Take 1 tablet (50 mg total) by mouth daily. Take with or immediately following a meal. 01/23/24   Crenshaw, Redell RAMAN, MD  mirabegron  ER (MYRBETRIQ ) 50 MG TB24 tablet Take 50 mg by mouth  every morning. 07/25/20   [provider]  modafinil  (PROVIGIL ) 200 MG tablet Take 1 tablet (200 mg total) by mouth daily. 11/23/23   Gayland Lauraine PARAS, NP  ocrelizumab  600 mg in sodium chloride  0.9 % 500 mL Inject 600 mg into the vein every 6 (six) months.    [provider]  Oxycodone  HCl 10 MG TABS Take 0.5 tablets (5 mg total) by mouth every 4 (four) hours as needed. 04/05/23   Meyran, Suzen Lacks, NP  potassium chloride  SA (KLOR-CON  M20) 20 MEQ tablet Take 2 tablets (40 mEq total) by mouth daily. 08/06/23 11/05/23  Pietro Redell RAMAN, MD  pramipexole  (MIRAPEX ) 0.25 MG tablet Take 1 tablet (0.25 mg total) by mouth at bedtime. 10/30/23    Gayland Lauraine PARAS, NP  traMADol (ULTRAM) 50 MG tablet Take 100 mg by mouth every 6 (six) hours as needed for moderate pain (pain score 4-6).    [provider]    Allergies: Demerol [meperidine], Erythromycin, Morphine and codeine, Sulfa antibiotics, Diflucan [fluconazole], Other, Erythromycin base, Fesoterodine, Sulfacetamide sodium, Tape, and Toviaz [fesoterodine fumarate er]    Review of Systems See HPI  Updated Vital Signs BP 131/89 (BP Location: Right Arm)   Pulse 96   Temp 97.9 F (36.6 C)   Resp 18   Ht 5' 2 (1.575 m)   Wt 72.6 kg   SpO2 97%   BMI 29.26 kg/m   Physical Exam Constitutional:      Appearance: Normal appearance.  HENT:     Head: Normocephalic.     Nose: Nose normal.  Eyes:     Conjunctiva/sclera: Conjunctivae normal.  Pulmonary:     Effort: Pulmonary effort is normal.  Musculoskeletal:     Comments: 0.5 cm laceration about the dorsum of the left hand just at the base of the left thumb.  Not actively bleeding.  Range of motion of the thumb is normal.  Sensation intact distally.  Cap refills brisk.  Neurological:     Mental Status: She is alert.  Psychiatric:        Mood and Affect: Mood normal.     (all labs ordered are listed, but only abnormal results are displayed) Labs Reviewed - No data to display  EKG: None  Radiology: DG Hand Complete Left Result Date: 05/12/2024 EXAM: 3 OR MORE VIEW(S) XRAY OF THE LEFT HAND 05/12/2024 08:13:39 PM COMPARISON: None available. CLINICAL HISTORY: Puncture Wound from knife FINDINGS: BONES AND JOINTS: Chronic ulnar styloid fracture. Degenerative changes of distal interphalangeal joints and second and third metacarpophalangeal joints. Resection or resorption of the distal phalangeal tuft of the 1st finger. No malalignment. SOFT TISSUES: No overlying soft tissue defect. No radiopaque soft tissue foreign bodies or soft tissue gas. IMPRESSION: 1. No acute osseous abnormality. 2. Resection or resorption of the  distal phalangeal tuft of the 1st finger. 3. No radiopaque soft tissue foreign body or soft tissue gas identified, and no overlying soft tissue defect is seen. Electronically signed by: Elsie Gravely MD 05/12/2024 08:18 PM EST RP Workstation: [id number]     .Laceration Repair  Date/Time: 05/12/2024 9:07 PM  Performed by: Lang Norleen POUR, PA-C Authorized by: Lang Norleen POUR, PA-C   Consent:    Consent obtained:  Verbal   Consent given by:  Patient   Risks discussed:  Infection and need for additional repair   Alternatives discussed:  No treatment Universal protocol:    Procedure explained and questions answered to patient or proxy's satisfaction: yes  Relevant documents present and verified: yes     Test results available: yes     Imaging studies available: yes     Patient identity confirmed:  Verbally with patient Anesthesia:    Anesthesia method:  Local infiltration   Local anesthetic:  Lidocaine  1% w/o epi Laceration details:    Location:  Hand   Hand location:  L hand, dorsum   Length (cm):  0.5   Depth (mm):  3 Pre-procedure details:    Preparation:  Patient was prepped and draped in usual sterile fashion Exploration:    Hemostasis achieved with:  Direct pressure   Imaging obtained: x-ray     Imaging outcome: foreign body noted     Wound exploration: wound explored through full range of motion     Wound extent: areolar tissue violated   Treatment:    Area cleansed with:  Shur-Clens and saline   Amount of cleaning:  Extensive   Irrigation solution:  Sterile water   Debridement:  None   Undermining:  None Skin repair:    Repair method:  Sutures   Suture size:  4-0   Suture material:  Nylon   Suture technique:  Simple interrupted   Number of sutures:  1 Approximation:    Approximation:  Close Repair type:    Repair type:  Simple Post-procedure details:    Dressing:  Non-adherent dressing   Procedure completion:  Tolerated well, no immediate complications     Medications Ordered in the ED  lidocaine  (PF) (XYLOCAINE ) 1 % injection 5 mL (5 mLs Infiltration Given 05/12/24 2050)  Tdap (ADACEL ) injection 0.5 mL (0.5 mLs Intramuscular Given 05/12/24 2050)                                    Medical Decision Making Amount and/or Complexity of Data Reviewed Radiology: ordered.  Risk Prescription drug management.   64 year old well-appearing female presenting for laceration to the dorsum of the left hand at the base of the thumb.  X-ray showed no acute osseous abnormality.  Possibly remote tuft fracture of the distal first finger.  Shared findings with patient.  Laceration repair is well-tolerated.  Advised appropriate wound care at home and to follow-up with her PCP.  Discussed when to remove the suture and return precautions should there be concern for infection.  Discharged with condition.     Final diagnoses:  Laceration of left hand without foreign body, initial encounter    ED Discharge Orders     None          Lang Norleen MARLA DEVONNA 05/12/24 2111    Randol Simmonds, MD 05/13/24 1130  "

## 2024-05-12 NOTE — Discharge Instructions (Signed)
 Sutured repair Keep the laceration site dry for the next 24 hours and leave the dressing in place. After 24 hours you may remove the dressing and gently clean the laceration site with antibacterial soap and warm water. Do not scrub the area. Do not soak the area and water for long periods of time. Dont use hydrogen peroxide, iodine-based solutions, or alcohol, which can slow healing, and will probably be painful! Apply topical bacitracin 1-2 times per day for the next 3-5 days. Return to the emergency department (or PCP)  in 6 to 8 days for removal of the sutures.  You should return sooner for any signs of infection which would include increased redness around the wound, increased swelling, new drainage of yellow pus.

## 2024-05-19 ENCOUNTER — Other Ambulatory Visit: Payer: Self-pay

## 2024-05-19 DIAGNOSIS — E785 Hyperlipidemia, unspecified: Secondary | ICD-10-CM

## 2024-05-20 ENCOUNTER — Ambulatory Visit: Admitting: Diagnostic Neuroimaging

## 2024-05-20 ENCOUNTER — Emergency Department (HOSPITAL_BASED_OUTPATIENT_CLINIC_OR_DEPARTMENT_OTHER)
Admission: EM | Admit: 2024-05-20 | Discharge: 2024-05-20 | Discharge status: Against Medical Advice | Attending: Emergency Medicine | Admitting: Emergency Medicine

## 2024-05-20 ENCOUNTER — Other Ambulatory Visit: Payer: Self-pay

## 2024-05-20 ENCOUNTER — Encounter: Payer: Self-pay | Admitting: Diagnostic Neuroimaging

## 2024-05-20 ENCOUNTER — Encounter (HOSPITAL_BASED_OUTPATIENT_CLINIC_OR_DEPARTMENT_OTHER): Payer: Self-pay

## 2024-05-20 VITALS — BP 134/88 | HR 80 | Ht 62.0 in | Wt 164.6 lb

## 2024-05-20 DIAGNOSIS — Z4802 Encounter for removal of sutures: Secondary | ICD-10-CM | POA: Diagnosis present

## 2024-05-20 DIAGNOSIS — Z5321 Procedure and treatment not carried out due to patient leaving prior to being seen by health care provider: Secondary | ICD-10-CM | POA: Diagnosis not present

## 2024-05-20 DIAGNOSIS — G35C0 Secondary progressive multiple sclerosis, unspecified: Secondary | ICD-10-CM | POA: Diagnosis not present

## 2024-05-20 DIAGNOSIS — G35D Multiple sclerosis, unspecified: Secondary | ICD-10-CM

## 2024-05-20 MED ORDER — EZETIMIBE 10 MG PO TABS: 10.0000 mg | ORAL_TABLET | Freq: Every day | ORAL | 1 refills | Status: AC

## 2024-05-20 NOTE — Progress Notes (Signed)
 "  [facility]  PATIENT: Summer Henderson DOB: [birth year]  REFERRING CLINICIAN: Fritzi Nest, MD HISTORY FROM: patient  REASON FOR VISIT: follow up   HISTORICAL  CHIEF COMPLAINT:  Chief Complaint  Patient presents with   RM 6     Patient is here alone for MS - last visit with sarah was told to discuss ocrevus  infusions vs. Shots with Dr. SHAUNNA    HISTORY OF PRESENT ILLNESS:   UPDATE (05/20/24, VRP): Since last visit, doing well overall. Some balance issues over the last 1 year. MRI brain is stable. Fatigue persistent.   UPDATE (08/01/21, VRP): Since last visit, here for transition of care. Dx'd with MS in 2005 (bladder sxs, fatigue). Then has had balance issue, blurred vision, confusion.   Since last visit, symptoms are stable. Tolerating ocrevus . Tolerating pramipexole  (for restless sensation in left arm / torso). Tolerating ritalin  for fatigue. Had been on provigil  in the past (last filled 2020).   PRIOR HPI (02/03/21, Willis): [name] is a 64 year old right-handed white female with a history of secondary progressive multiple sclerosis.  The patient is on Ocrevus  and tolerates the medication well.  She has a chronic gait disorder, she has not had any falls since last seen.  She reports some issues with restless leg syndrome but her Mirapex  is quite effective to treat this.  She began having blood in her urine this morning, she does have a history of frequent urinary tract infections.  She has no history of renal calculi.  She had a sinus infection in mid August and was treated with antibiotics for this.  She has had some decreased hearing in her right ear with fluid behind the ear and some worsening of her gait.  The patient normally uses a cane or a walker for ambulation.  She comes in today for further evaluation.  PRIOR HPI (07/08/20, Willis): [name] is a 64 year old right-handed white female with a history of multiple sclerosis.  The patient is on Ocrevus , she last got  her dose in January 2022.  She tolerates the drug fairly well.  She has not noted any definite new symptoms associated with the multiple sclerosis.  She does report some numbness in the hands, she has chronic weakness of the proximal muscles of the left leg.  She reports gait instability, she has not had any recent falls.  She has a neurogenic bladder that requires Botox injections, she has in and out catheterizations.  She is bothered significantly by a restless limb syndrome, her arms cause troubles at night, she has to move them vigorously to get rid of the sensation.  She is having trouble sleeping because of this.  She was recently taken off of Cymbalta  and converted to Zoloft, the restless symptoms began shortly thereafter.  The patient has had ongoing problems with depression, she is followed through psychiatry.  She returns to the office today for an evaluation.  REVIEW OF SYSTEMS: Full 14 system review of systems performed and negative with exception of: as per HPI.   ALLERGIES: Allergies  Allergen Reactions   Demerol [Meperidine] Other (See Comments)    [derm] rash, swelling, itching Other reaction(s): Other (See Comments) [derm] rash, swelling, itching   Erythromycin Rash   Morphine And Codeine Itching, Swelling and Rash    Denied allergy to codeine 03/23/23   Sulfa Antibiotics Rash   Diflucan [Fluconazole] Other (See Comments)    States she has prolonged QT and cannot take this.   Other  Other reaction(s): Other (See Comments) Z-pak causes EKG changes, was told by cardiologist not to take this.   Erythromycin Base Rash    [Derm] rash   Fesoterodine Palpitations   Sulfacetamide Sodium Rash   Tape Other (See Comments)    Adhesive tape And Steri Strips; Local reaction-burns skin   Toviaz [Fesoterodine Fumarate Er] Palpitations    HOME MEDICATIONS: Outpatient Medications Prior to Visit  Medication Sig Dispense Refill   albuterol  (VENTOLIN  HFA) 108 (90 Base) MCG/ACT inhaler  Inhale 2 puffs by mouth into the lungs every 6 (six) hours as needed for wheezing or shortness of breath. 18 g 0   amLODipine  (NORVASC ) 5 MG tablet Take 1 tablet (5 mg total) by mouth daily. 90 tablet 2   amoxicillin (AMOXIL) 500 MG capsule Take 2,000 mg by mouth once. (Patient taking differently: Take 2,000 mg by mouth once. As needed)     aspirin  81 MG chewable tablet Chew 81 mg by mouth daily.     atorvastatin  (LIPITOR) 80 MG tablet TAKE ONE TABLET BY MOUTH ONE TIME DAILY 90 tablet 3   Azelastine HCl 137 MCG/SPRAY SOLN Place 2 sprays into both nostrils daily as needed (Congestion).     Cholecalciferol (VITAMIN D -3) 125 MCG (5000 UT) TABS Take 5,000 Units by mouth daily.     ciprofloxacin  (CIPRO ) 500 MG tablet Take 500 mg by mouth as needed (Bladder infections).     diphenhydrAMINE (BENADRYL) 50 MG capsule Take 1 capsule by mouth as needed for itching (for side effect for Ocrevus ).     esomeprazole  (NEXIUM ) 40 MG capsule Take 1 capsule (40 mg total) by mouth daily at 12 noon. 90 capsule 3   ezetimibe  (ZETIA ) 10 MG tablet Take 1 tablet (10 mg total) by mouth daily. 90 tablet 1   ferrous gluconate  (FERGON) 324 MG tablet Take 324 mg by mouth daily with breakfast.     furosemide  (LASIX ) 20 MG tablet Take 2 tablets (40 mg total) by mouth daily. 180 tablet 2   gabapentin  (NEURONTIN ) 100 MG capsule Take 1 capsule (100 mg total) by mouth at bedtime as needed (Nerve pain). 90 capsule 0   linaclotide  (LINZESS ) 145 MCG CAPS capsule Take 145 mcg by mouth daily before breakfast.     losartan  (COZAAR ) 100 MG tablet Take 1 tablet (100 mg total) by mouth daily. 90 tablet 2   methocarbamol  (ROBAXIN ) 500 MG tablet Take 1 tablet (500 mg total) by mouth every 6 (six) hours as needed for muscle spasms. 60 tablet 1   methylphenidate  (RITALIN ) 20 MG tablet Take 1 tablet (20 mg total) by mouth daily. 30 tablet 0   metoprolol  succinate (TOPROL -XL) 50 MG 24 hr tablet Take 1 tablet (50 mg total) by mouth daily. Take with  or immediately following a meal. 90 tablet 2   mirabegron  ER (MYRBETRIQ ) 50 MG TB24 tablet Take 50 mg by mouth every morning.     modafinil  (PROVIGIL ) 200 MG tablet Take 1 tablet (200 mg total) by mouth daily. 30 tablet 5   ocrelizumab  600 mg in sodium chloride  0.9 % 500 mL Inject 600 mg into the vein every 6 (six) months.     Oxycodone  HCl 10 MG TABS Take 0.5 tablets (5 mg total) by mouth every 4 (four) hours as needed. 40 tablet 0   potassium chloride  SA (KLOR-CON  M20) 20 MEQ tablet Take 2 tablets (40 mEq total) by mouth daily. 180 tablet 3   pramipexole  (MIRAPEX ) 0.25 MG tablet Take 1 tablet (0.25 mg total)  by mouth at bedtime. 90 tablet 3   traMADol (ULTRAM) 50 MG tablet Take 100 mg by mouth every 6 (six) hours as needed for moderate pain (pain score 4-6).     No facility-administered medications prior to visit.      PHYSICAL EXAM  GENERAL EXAM/CONSTITUTIONAL: Vitals:  Vitals:   05/20/24 1315  BP: 134/88  Pulse: 80  Weight: 164 lb 9.6 oz (74.7 kg)  Height: 5' 2 (1.575 m)   Body mass index is 30.11 kg/m. Wt Readings from Last 3 Encounters:  05/20/24 164 lb 9.6 oz (74.7 kg)  05/12/24 160 lb (72.6 kg)  11/05/23 156 lb (70.8 kg)   Patient is in no distress; well developed, nourished and groomed; neck is supple  CARDIOVASCULAR: Examination of carotid arteries is normal; no carotid bruits Regular rate and rhythm, no murmurs Examination of peripheral vascular system by observation and palpation is normal  EYES: Ophthalmoscopic exam of optic discs and posterior segments is normal; no papilledema or hemorrhages No results found.  MUSCULOSKELETAL: Gait, strength, tone, movements noted in Neurologic exam below  NEUROLOGIC: MENTAL STATUS:      No data to display         awake, alert, oriented to person, place and time recent and remote memory intact normal attention and concentration language fluent, comprehension intact, naming intact fund of knowledge  appropriate  CRANIAL NERVE:  2nd - no papilledema on fundoscopic exam 2nd, 3rd, 4th, 6th - pupils equal and reactive to light, visual fields full to confrontation, extraocular muscles intact, no nystagmus 5th - facial sensation symmetric 7th - facial strength symmetric 8th - hearing intact 9th - palate elevates symmetrically, uvula midline 11th - shoulder shrug symmetric 12th - tongue protrusion midline  MOTOR:  normal bulk and tone, full strength in the BUE, RLE LLE 3-4; INCREASED TONE  SENSORY:  normal and symmetric to light touch, temperature, vibration; DECR IN LEFT LEG  COORDINATION:  finger-nose-finger, fine finger movements normal  REFLEXES:  deep tendon reflexes BRISK IN LEFT LEG; CLONUS IN LEFT ANKLE  GAIT/STATION:  narrow based gait; ATAXIC GAIT; UNSTEADY WITH LEGS TOGETHER AND EYES OPEN     DIAGNOSTIC DATA (LABS, IMAGING, TESTING) - I reviewed patient records, labs, notes, testing and imaging myself where available.  Lab Results  Component Value Date   WBC 10.0 11/06/2023   HGB 15.4 11/06/2023   HCT 46.1 11/06/2023   MCV 95 11/06/2023   PLT 424 11/06/2023      Component Value Date/Time   NA 139 11/05/2023 1544   K 4.2 11/05/2023 1544   CL 100 11/05/2023 1544   CO2 23 11/05/2023 1544   GLUCOSE 71 11/05/2023 1544   GLUCOSE 91 04/13/2023 1345   BUN 6 (L) 11/05/2023 1544   CREATININE 0.82 11/05/2023 1544   CREATININE 0.71 01/11/2016 1502   CALCIUM  9.8 11/05/2023 1544   PROT 6.4 11/06/2023 1404   ALBUMIN 4.3 11/06/2023 1404   AST 16 11/06/2023 1404   ALT 16 11/06/2023 1404   ALKPHOS 190 (H) 11/06/2023 1404   BILITOT 0.5 11/06/2023 1404   GFRNONAA 49 (L) 04/13/2023 1345   GFRNONAA >89 06/17/2014 1111   GFRAA 98 03/24/2020 1526   GFRAA >89 06/17/2014 1111   Lab Results  Component Value Date   CHOL 111 10/19/2022   HDL 38 (L) 10/19/2022   LDLCALC 48 10/19/2022   TRIG 148 10/19/2022   CHOLHDL 2.9 10/19/2022   No results found for:  HGBA1C No results found for: VITAMINB12 No results  found for: TSH   07/24/20 MRI brain w/wo - Stable burden chronic demyelinating disease. No evidence of active demyelination.  06/10/23 MRI lumbar spine 1. Postsurgical changes from laminectomy and fusion at L4-5 without residual spinal canal or neural foraminal stenosis. 2. Mild progression of degenerative changes at L1-2 with moderate left subarticular zone stenosis and moderate left neural foraminal narrowing. 3. Stable mild-to-moderate bilateral neural foraminal narrowing at L3-4 and moderate right neural foraminal narrowing at L5-S1.  11/18/23 MRI brain  Multiple periventricular and subcortical demyelinating lesions. There are no new lesions compared with the prior study from August 09, 2022. No enhancement.  11/18/23 MRI cervical spine  1. There are demyelinating lesions in the posterior central aspect of the cord at the C2 level and in the central cord at the T1 level without enhancement. No prior studies for comparison. 2. 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 There is no significant spinal stenosis.    ASSESSMENT AND PLAN  64 y.o. year old female here with:   Dx:  1. Multiple sclerosis   2. Secondary progressive multiple sclerosis      PLAN:  MULTIPLE SCLEROSIS (secondary progressive; initially on copaxone (2005), avonex , tysabri , then ocrevus  since 2020) - continue ocrevus  infusion (Feb, Aug schedule)  --> (Previously Dr. Jenel had Ocrevus  infused over 3 hours with 1 hour monitoring period afterward.  Also patient was receiving Solu-Medrol  100 mg premedication instead of 125 mg.  Patient does not want to take Pepcid and instead she takes daily Nexium .) - check CBC and immunoglobulin panel every 6 months - use cane / walker; consider PT evaluation - discussed possibility of weaning off ocrevus  around age 15 years old  FATIGUE - continue ritalin  and provigil  - continue duloxetine  for  depression  RESTLESS LEGS - continue pramipexole  and gabapentin  - iron  deficiency replacement per PCP  Orders Placed This Encounter  Procedures   CBC with Differential/Platelet   Immunoglobulins, QN, A/E/G/M   Return in about 6 months (around 11/17/2024) for with NP Teddi Born).    EDUARD FABIENE HANLON, MD 05/20/2024, 1:58 PM Certified in Neurology, Neurophysiology and Neuroimaging  [facility] [address] [place], [place] [address] [phone]  "

## 2024-05-20 NOTE — ED Triage Notes (Signed)
 Needs stitch removal from L thumb. Denies fever, swelling, redness

## 2024-05-23 LAB — CBC WITH DIFFERENTIAL/PLATELET
Basophils Absolute: 0.1 x10E3/uL (ref 0.0–0.2)
Basos: 1 %
EOS (ABSOLUTE): 0.1 x10E3/uL (ref 0.0–0.4)
Eos: 1 %
Hematocrit: 45.6 % (ref 34.0–46.6)
Hemoglobin: 15.7 g/dL (ref 11.1–15.9)
Immature Grans (Abs): 0 x10E3/uL (ref 0.0–0.1)
Immature Granulocytes: 0 %
Lymphocytes Absolute: 2.9 x10E3/uL (ref 0.7–3.1)
Lymphs: 27 %
MCH: 32.4 pg (ref 26.6–33.0)
MCHC: 34.4 g/dL (ref 31.5–35.7)
MCV: 94 fL (ref 79–97)
Monocytes Absolute: 0.8 x10E3/uL (ref 0.1–0.9)
Monocytes: 8 %
Neutrophils Absolute: 6.7 x10E3/uL (ref 1.4–7.0)
Neutrophils: 63 %
Platelets: 466 x10E3/uL — ABNORMAL HIGH (ref 150–450)
RBC: 4.85 x10E6/uL (ref 3.77–5.28)
RDW: 12.7 % (ref 11.7–15.4)
WBC: 10.6 x10E3/uL (ref 3.4–10.8)

## 2024-05-23 LAB — IMMUNOGLOBULINS A/E/G/M, SERUM
IgE (Immunoglobulin E), Serum: 4 [IU]/mL — AB (ref 6–495)
IgG (Immunoglobin G), Serum: 910 mg/dL (ref 586–1602)
IgM (Immunoglobulin M), Srm: 18 mg/dL — ABNORMAL LOW (ref 26–217)
Immunoglobulin A, (IgA) QN, Serum: 85 mg/dL — ABNORMAL LOW (ref 87–352)

## 2024-05-29 ENCOUNTER — Encounter: Payer: Self-pay | Admitting: Neurology

## 2024-05-29 DIAGNOSIS — G35D Multiple sclerosis, unspecified: Secondary | ICD-10-CM

## 2024-05-29 MED ORDER — METHYLPHENIDATE HCL 20 MG PO TABS: 20.0000 mg | ORAL_TABLET | Freq: Every day | ORAL | 0 refills | Status: AC

## 2024-05-29 NOTE — Telephone Encounter (Signed)
 Last seen 05/20/24 Next appt 11/19/24 Dispenses   Dispensed Days Supply Quantity Provider Pharmacy  METHYLPHENIDATE  20 MG TAB 04/30/2024  25 each  Unspecified  methylphenidate  20 mg tablet 04/29/2024 30 30 tablet Gayland Lauraine PARAS, NP [facility] [phone] [place]...  methylphenidate  20 mg tablet 03/25/2024 30 30 tablet Gayland Lauraine PARAS, NP [facility] [phone] [place]...  methylphenidate  20 mg tablet 02/21/2024 30 30 tablet Gayland Lauraine PARAS, NP [facility] [phone] [place]...  methylphenidate  20 mg tablet 01/22/2024 30 30 tablet Gayland Lauraine PARAS, NP [facility] [phone] [place]...  methylphenidate  20 mg tablet 12/17/2023 30 30 tablet Penumalli, Vikram R, MD [facility] [phone] [place]...  methylphenidate  20 mg tablet 11/05/2023 30 30 tablet Gayland Lauraine PARAS, NP [facility] [phone] [place]...  methylphenidate  20 mg tablet 10/05/2023 30 30 tablet Dohmeier, Dedra, MD [facility] [phone] [place]...  METHYLPHENIDATE  20 MG TAB 09/02/2023  20 each  Unspecified  methylphenidate  20 mg tablet 08/29/2023 30 30 tablet Gayland Lauraine PARAS, NP [facility] [phone] [place]...  methylphenidate  20 mg tablet 07/26/2023 30 30 tablet Gayland Lauraine PARAS, NP [facility] [phone] [place]...  methylphenidate  20 mg tablet 06/12/2023 30 30 tablet Gayland Lauraine PARAS, NP [facility] [phone] [place]..Summer Henderson

## 2024-11-19 ENCOUNTER — Ambulatory Visit: Admitting: Neurology
